# Patient Record
Sex: Female | Born: 1935 | Race: White | Hispanic: No | Marital: Married | Smoking: Never smoker
Health system: Southern US, Community
[De-identification: ages and names within clinical notes are randomized; demographics above are authoritative.]

## PROBLEM LIST (undated history)

## (undated) DIAGNOSIS — M8580 Other specified disorders of bone density and structure, unspecified site: Secondary | ICD-10-CM

## (undated) DIAGNOSIS — E782 Mixed hyperlipidemia: Secondary | ICD-10-CM

## (undated) DIAGNOSIS — K449 Diaphragmatic hernia without obstruction or gangrene: Secondary | ICD-10-CM

## (undated) DIAGNOSIS — K745 Biliary cirrhosis, unspecified: Secondary | ICD-10-CM

## (undated) DIAGNOSIS — R600 Localized edema: Secondary | ICD-10-CM

## (undated) DIAGNOSIS — E669 Obesity, unspecified: Secondary | ICD-10-CM

## (undated) DIAGNOSIS — I131 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, with stage 1 through stage 4 chronic kidney disease, or unspecified chronic kidney disease: Secondary | ICD-10-CM

## (undated) DIAGNOSIS — D649 Anemia, unspecified: Secondary | ICD-10-CM

## (undated) DIAGNOSIS — E7800 Pure hypercholesterolemia, unspecified: Secondary | ICD-10-CM

## (undated) DIAGNOSIS — K635 Polyp of colon: Secondary | ICD-10-CM

## (undated) DIAGNOSIS — D259 Leiomyoma of uterus, unspecified: Secondary | ICD-10-CM

## (undated) DIAGNOSIS — E119 Type 2 diabetes mellitus without complications: Secondary | ICD-10-CM

## (undated) DIAGNOSIS — M546 Pain in thoracic spine: Secondary | ICD-10-CM

## (undated) DIAGNOSIS — I10 Essential (primary) hypertension: Secondary | ICD-10-CM

## (undated) DIAGNOSIS — Z86010 Personal history of colonic polyps: Secondary | ICD-10-CM

## (undated) DIAGNOSIS — K5731 Diverticulosis of large intestine without perforation or abscess with bleeding: Secondary | ICD-10-CM

## (undated) DIAGNOSIS — D508 Other iron deficiency anemias: Secondary | ICD-10-CM

## (undated) DIAGNOSIS — I480 Paroxysmal atrial fibrillation: Secondary | ICD-10-CM

## (undated) DIAGNOSIS — K222 Esophageal obstruction: Secondary | ICD-10-CM

## (undated) DIAGNOSIS — N958 Other specified menopausal and perimenopausal disorders: Secondary | ICD-10-CM

## (undated) DIAGNOSIS — Z794 Long term (current) use of insulin: Secondary | ICD-10-CM

## (undated) DIAGNOSIS — N183 Chronic kidney disease, stage 3 unspecified: Secondary | ICD-10-CM

## (undated) DIAGNOSIS — K209 Esophagitis, unspecified: Secondary | ICD-10-CM

## (undated) DIAGNOSIS — K219 Gastro-esophageal reflux disease without esophagitis: Secondary | ICD-10-CM

## (undated) DIAGNOSIS — M1990 Unspecified osteoarthritis, unspecified site: Secondary | ICD-10-CM

## (undated) DIAGNOSIS — K8020 Calculus of gallbladder without cholecystitis without obstruction: Secondary | ICD-10-CM

## (undated) HISTORY — DX: Type 2 diabetes mellitus without complications: E11.9

## (undated) HISTORY — DX: Mixed hyperlipidemia: E78.2

## (undated) HISTORY — PX: BREAST CYST EXCISION: SHX579

## (undated) HISTORY — DX: Essential (primary) hypertension: I10

## (undated) HISTORY — DX: Diverticulosis of large intestine without perforation or abscess with bleeding: K57.31

## (undated) HISTORY — DX: Diaphragmatic hernia without obstruction or gangrene: K44.9

## (undated) HISTORY — DX: Anemia, unspecified: D64.9

## (undated) HISTORY — DX: Localized edema: R60.0

## (undated) HISTORY — PX: COLONOSCOPY: SHX174

## (undated) HISTORY — DX: Other iron deficiency anemias: D50.8

## (undated) HISTORY — DX: Obesity, unspecified: E66.9

## (undated) HISTORY — DX: Esophagitis, unspecified: K20.9

## (undated) HISTORY — DX: Leiomyoma of uterus, unspecified: D25.9

## (undated) HISTORY — DX: Pain in thoracic spine: M54.6

## (undated) HISTORY — DX: Polyp of colon: K63.5

## (undated) HISTORY — DX: Calculus of gallbladder without cholecystitis without obstruction: K80.20

## (undated) HISTORY — DX: Other specified disorders of bone density and structure, unspecified site: M85.80

## (undated) HISTORY — DX: Esophageal obstruction: K22.2

## (undated) HISTORY — DX: Pure hypercholesterolemia, unspecified: E78.00

## (undated) HISTORY — DX: Chronic kidney disease, stage 3 unspecified: N18.30

## (undated) HISTORY — DX: Biliary cirrhosis, unspecified: K74.5

## (undated) HISTORY — DX: Paroxysmal atrial fibrillation: I48.0

## (undated) HISTORY — DX: Gastro-esophageal reflux disease without esophagitis: K21.9

## (undated) HISTORY — DX: Other specified menopausal and perimenopausal disorders: N95.8

## (undated) HISTORY — DX: Long term (current) use of insulin: Z79.4

## (undated) HISTORY — PX: POLYPECTOMY: SHX149

## (undated) HISTORY — DX: 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, with stage 1 through stage 4 chronic kidney disease, or unspecified chronic kidney disease: I13.10

## (undated) HISTORY — DX: Personal history of colonic polyps: Z86.010

---

## 1970-05-12 HISTORY — PX: TUBAL LIGATION: SHX77

## 1978-05-12 HISTORY — PX: THYROIDECTOMY, PARTIAL: SHX18

## 1998-04-09 ENCOUNTER — Ambulatory Visit (HOSPITAL_COMMUNITY): Admission: RE | Admit: 1998-04-09 | Discharge: 1998-04-09 | Payer: Self-pay | Admitting: Infectious Diseases

## 1999-04-15 ENCOUNTER — Ambulatory Visit (HOSPITAL_COMMUNITY): Admission: RE | Admit: 1999-04-15 | Discharge: 1999-04-15 | Payer: Self-pay | Admitting: Internal Medicine

## 1999-04-15 ENCOUNTER — Encounter: Payer: Self-pay | Admitting: Internal Medicine

## 2000-04-21 ENCOUNTER — Encounter: Payer: Self-pay | Admitting: Internal Medicine

## 2000-04-21 ENCOUNTER — Ambulatory Visit (HOSPITAL_COMMUNITY): Admission: RE | Admit: 2000-04-21 | Discharge: 2000-04-21 | Payer: Self-pay | Admitting: Internal Medicine

## 2000-06-08 ENCOUNTER — Other Ambulatory Visit: Admission: RE | Admit: 2000-06-08 | Discharge: 2000-06-08 | Payer: Self-pay | Admitting: Gastroenterology

## 2000-06-08 ENCOUNTER — Encounter (INDEPENDENT_AMBULATORY_CARE_PROVIDER_SITE_OTHER): Payer: Self-pay | Admitting: Specialist

## 2000-06-08 DIAGNOSIS — K5731 Diverticulosis of large intestine without perforation or abscess with bleeding: Secondary | ICD-10-CM

## 2000-06-08 HISTORY — DX: Diverticulosis of large intestine without perforation or abscess with bleeding: K57.31

## 2000-07-07 ENCOUNTER — Encounter: Admission: RE | Admit: 2000-07-07 | Discharge: 2000-07-07 | Payer: Self-pay | Admitting: Internal Medicine

## 2000-07-07 ENCOUNTER — Encounter: Payer: Self-pay | Admitting: Internal Medicine

## 2001-05-10 ENCOUNTER — Encounter: Payer: Self-pay | Admitting: Family Medicine

## 2001-05-10 ENCOUNTER — Ambulatory Visit (HOSPITAL_COMMUNITY): Admission: RE | Admit: 2001-05-10 | Discharge: 2001-05-10 | Payer: Self-pay | Admitting: Family Medicine

## 2001-09-28 ENCOUNTER — Encounter: Admission: RE | Admit: 2001-09-28 | Discharge: 2001-09-28 | Payer: Self-pay | Admitting: Internal Medicine

## 2001-09-28 ENCOUNTER — Encounter: Payer: Self-pay | Admitting: Family Medicine

## 2002-05-25 ENCOUNTER — Encounter: Payer: Self-pay | Admitting: Family Medicine

## 2002-05-25 ENCOUNTER — Ambulatory Visit (HOSPITAL_COMMUNITY): Admission: RE | Admit: 2002-05-25 | Discharge: 2002-05-25 | Payer: Self-pay | Admitting: Family Medicine

## 2003-06-20 ENCOUNTER — Ambulatory Visit (HOSPITAL_COMMUNITY): Admission: RE | Admit: 2003-06-20 | Discharge: 2003-06-20 | Payer: Self-pay | Admitting: Obstetrics and Gynecology

## 2003-07-31 ENCOUNTER — Encounter: Payer: Self-pay | Admitting: Gastroenterology

## 2004-03-19 ENCOUNTER — Ambulatory Visit: Payer: Self-pay | Admitting: Family Medicine

## 2004-07-17 ENCOUNTER — Ambulatory Visit: Payer: Self-pay | Admitting: Gastroenterology

## 2004-07-30 ENCOUNTER — Ambulatory Visit: Payer: Self-pay | Admitting: Gastroenterology

## 2004-07-30 ENCOUNTER — Ambulatory Visit (HOSPITAL_COMMUNITY): Admission: RE | Admit: 2004-07-30 | Discharge: 2004-07-30 | Payer: Self-pay | Admitting: Family Medicine

## 2004-08-29 ENCOUNTER — Ambulatory Visit: Payer: Self-pay | Admitting: Gastroenterology

## 2004-10-31 ENCOUNTER — Ambulatory Visit: Payer: Self-pay | Admitting: Family Medicine

## 2005-01-06 ENCOUNTER — Ambulatory Visit: Payer: Self-pay | Admitting: Family Medicine

## 2005-01-29 ENCOUNTER — Ambulatory Visit: Payer: Self-pay | Admitting: Family Medicine

## 2005-02-05 ENCOUNTER — Ambulatory Visit: Payer: Self-pay | Admitting: Gastroenterology

## 2005-02-11 ENCOUNTER — Ambulatory Visit: Payer: Self-pay | Admitting: Gastroenterology

## 2005-03-19 ENCOUNTER — Ambulatory Visit: Payer: Self-pay | Admitting: Family Medicine

## 2005-07-07 ENCOUNTER — Ambulatory Visit: Payer: Self-pay | Admitting: Family Medicine

## 2005-07-29 ENCOUNTER — Ambulatory Visit: Payer: Self-pay | Admitting: Family Medicine

## 2005-08-21 ENCOUNTER — Ambulatory Visit (HOSPITAL_COMMUNITY): Admission: RE | Admit: 2005-08-21 | Discharge: 2005-08-21 | Payer: Self-pay | Admitting: Obstetrics and Gynecology

## 2005-12-31 ENCOUNTER — Ambulatory Visit: Payer: Self-pay | Admitting: Internal Medicine

## 2006-01-09 ENCOUNTER — Ambulatory Visit: Payer: Self-pay | Admitting: Internal Medicine

## 2006-01-14 ENCOUNTER — Ambulatory Visit: Payer: Self-pay | Admitting: *Deleted

## 2006-01-19 ENCOUNTER — Ambulatory Visit: Payer: Self-pay | Admitting: *Deleted

## 2006-01-26 ENCOUNTER — Ambulatory Visit: Payer: Self-pay | Admitting: Cardiology

## 2006-01-26 ENCOUNTER — Ambulatory Visit: Payer: Self-pay | Admitting: Gastroenterology

## 2006-02-03 ENCOUNTER — Ambulatory Visit: Payer: Self-pay | Admitting: Gastroenterology

## 2006-02-04 ENCOUNTER — Ambulatory Visit: Payer: Self-pay | Admitting: Cardiology

## 2006-02-12 ENCOUNTER — Ambulatory Visit: Payer: Self-pay | Admitting: Cardiology

## 2006-02-19 ENCOUNTER — Ambulatory Visit: Payer: Self-pay | Admitting: Cardiology

## 2006-03-05 ENCOUNTER — Ambulatory Visit: Payer: Self-pay | Admitting: Cardiology

## 2006-03-26 ENCOUNTER — Ambulatory Visit: Payer: Self-pay | Admitting: Cardiology

## 2006-04-24 ENCOUNTER — Ambulatory Visit: Payer: Self-pay | Admitting: Internal Medicine

## 2006-05-21 ENCOUNTER — Ambulatory Visit: Payer: Self-pay | Admitting: Cardiology

## 2006-06-18 ENCOUNTER — Ambulatory Visit: Payer: Self-pay | Admitting: Cardiology

## 2006-07-16 ENCOUNTER — Ambulatory Visit: Payer: Self-pay | Admitting: Cardiology

## 2006-08-13 ENCOUNTER — Ambulatory Visit: Payer: Self-pay | Admitting: Cardiology

## 2006-09-03 ENCOUNTER — Ambulatory Visit (HOSPITAL_COMMUNITY): Admission: RE | Admit: 2006-09-03 | Discharge: 2006-09-03 | Payer: Self-pay | Admitting: Family Medicine

## 2006-09-14 ENCOUNTER — Ambulatory Visit: Payer: Self-pay | Admitting: Cardiovascular Disease

## 2006-10-08 ENCOUNTER — Ambulatory Visit: Payer: Self-pay | Admitting: *Deleted

## 2006-11-04 ENCOUNTER — Ambulatory Visit: Payer: Self-pay | Admitting: Internal Medicine

## 2006-12-03 ENCOUNTER — Ambulatory Visit: Payer: Self-pay | Admitting: Cardiology

## 2006-12-31 ENCOUNTER — Ambulatory Visit: Payer: Self-pay | Admitting: Cardiology

## 2007-01-21 ENCOUNTER — Ambulatory Visit: Payer: Self-pay | Admitting: Cardiology

## 2007-02-18 ENCOUNTER — Ambulatory Visit: Payer: Self-pay | Admitting: Cardiology

## 2007-03-18 ENCOUNTER — Ambulatory Visit: Payer: Self-pay | Admitting: Cardiology

## 2007-03-23 ENCOUNTER — Ambulatory Visit: Payer: Self-pay | Admitting: Internal Medicine

## 2007-03-23 LAB — CONVERTED CEMR LAB
BUN: 14 mg/dL (ref 6–23)
CO2: 31 meq/L (ref 19–32)
Calcium: 9.2 mg/dL (ref 8.4–10.5)
Chloride: 101 meq/L (ref 96–112)
Creatinine, Ser: 0.7 mg/dL (ref 0.4–1.2)
GFR calc Af Amer: 106 mL/min
GFR calc non Af Amer: 88 mL/min
Glucose, Bld: 203 mg/dL — ABNORMAL HIGH (ref 70–99)
Potassium: 4.1 meq/L (ref 3.5–5.1)
Sodium: 140 meq/L (ref 135–145)

## 2007-04-15 ENCOUNTER — Ambulatory Visit: Payer: Self-pay | Admitting: Cardiovascular Disease

## 2007-05-04 ENCOUNTER — Ambulatory Visit: Payer: Self-pay | Admitting: Cardiology

## 2007-05-20 ENCOUNTER — Ambulatory Visit: Payer: Self-pay | Admitting: Internal Medicine

## 2007-06-17 ENCOUNTER — Ambulatory Visit: Payer: Self-pay | Admitting: Cardiology

## 2007-07-01 ENCOUNTER — Ambulatory Visit: Payer: Self-pay | Admitting: Cardiology

## 2007-07-29 ENCOUNTER — Ambulatory Visit: Payer: Self-pay | Admitting: Cardiology

## 2007-08-26 ENCOUNTER — Ambulatory Visit: Payer: Self-pay | Admitting: Cardiology

## 2007-09-23 ENCOUNTER — Ambulatory Visit (HOSPITAL_COMMUNITY): Admission: RE | Admit: 2007-09-23 | Discharge: 2007-09-23 | Payer: Self-pay | Admitting: Family Medicine

## 2007-09-23 ENCOUNTER — Ambulatory Visit: Payer: Self-pay | Admitting: Internal Medicine

## 2007-09-28 ENCOUNTER — Telehealth: Payer: Self-pay | Admitting: Gastroenterology

## 2007-10-14 ENCOUNTER — Ambulatory Visit: Payer: Self-pay | Admitting: Internal Medicine

## 2007-10-14 ENCOUNTER — Ambulatory Visit: Payer: Self-pay | Admitting: Gastroenterology

## 2007-10-14 LAB — CONVERTED CEMR LAB
ALT: 24 units/L (ref 0–35)
AST: 26 units/L (ref 0–37)
Albumin: 3.3 g/dL — ABNORMAL LOW (ref 3.5–5.2)
Alkaline Phosphatase: 141 units/L — ABNORMAL HIGH (ref 39–117)
Bilirubin, Direct: 0.1 mg/dL (ref 0.0–0.3)
Total Bilirubin: 0.7 mg/dL (ref 0.3–1.2)
Total Protein: 7.6 g/dL (ref 6.0–8.3)

## 2007-10-27 DIAGNOSIS — Z8719 Personal history of other diseases of the digestive system: Secondary | ICD-10-CM

## 2007-10-27 DIAGNOSIS — I10 Essential (primary) hypertension: Secondary | ICD-10-CM

## 2007-10-27 DIAGNOSIS — E119 Type 2 diabetes mellitus without complications: Secondary | ICD-10-CM | POA: Insufficient documentation

## 2007-10-27 DIAGNOSIS — Z86010 Personal history of colonic polyps: Secondary | ICD-10-CM

## 2007-10-27 DIAGNOSIS — D649 Anemia, unspecified: Secondary | ICD-10-CM

## 2007-10-27 DIAGNOSIS — K743 Primary biliary cirrhosis: Secondary | ICD-10-CM

## 2007-10-27 DIAGNOSIS — K745 Biliary cirrhosis, unspecified: Secondary | ICD-10-CM | POA: Insufficient documentation

## 2007-10-27 DIAGNOSIS — K5731 Diverticulosis of large intestine without perforation or abscess with bleeding: Secondary | ICD-10-CM | POA: Insufficient documentation

## 2007-10-27 DIAGNOSIS — E1159 Type 2 diabetes mellitus with other circulatory complications: Secondary | ICD-10-CM | POA: Insufficient documentation

## 2007-10-27 DIAGNOSIS — E7800 Pure hypercholesterolemia, unspecified: Secondary | ICD-10-CM | POA: Insufficient documentation

## 2007-10-27 HISTORY — DX: Anemia, unspecified: D64.9

## 2007-10-27 HISTORY — DX: Primary biliary cirrhosis: K74.3

## 2007-10-27 HISTORY — DX: Essential (primary) hypertension: I10

## 2007-10-27 HISTORY — DX: Personal history of other diseases of the digestive system: Z87.19

## 2007-10-28 ENCOUNTER — Ambulatory Visit: Payer: Self-pay | Admitting: Gastroenterology

## 2007-11-11 ENCOUNTER — Ambulatory Visit: Payer: Self-pay | Admitting: Cardiology

## 2007-11-17 ENCOUNTER — Ambulatory Visit: Payer: Self-pay | Admitting: Internal Medicine

## 2007-12-09 ENCOUNTER — Ambulatory Visit: Payer: Self-pay | Admitting: Cardiology

## 2007-12-23 ENCOUNTER — Ambulatory Visit: Payer: Self-pay | Admitting: Internal Medicine

## 2008-01-13 ENCOUNTER — Ambulatory Visit: Payer: Self-pay | Admitting: Internal Medicine

## 2008-01-27 ENCOUNTER — Ambulatory Visit: Payer: Self-pay | Admitting: Cardiology

## 2008-02-17 ENCOUNTER — Ambulatory Visit: Payer: Self-pay | Admitting: Cardiology

## 2008-03-16 ENCOUNTER — Ambulatory Visit: Payer: Self-pay | Admitting: Cardiology

## 2008-04-13 ENCOUNTER — Ambulatory Visit: Payer: Self-pay | Admitting: Cardiology

## 2008-04-27 ENCOUNTER — Ambulatory Visit: Payer: Self-pay | Admitting: Cardiology

## 2008-05-18 ENCOUNTER — Ambulatory Visit: Payer: Self-pay | Admitting: Cardiovascular Disease

## 2008-06-15 ENCOUNTER — Ambulatory Visit: Payer: Self-pay | Admitting: Internal Medicine

## 2008-07-13 ENCOUNTER — Ambulatory Visit: Payer: Self-pay | Admitting: Cardiovascular Disease

## 2008-08-10 ENCOUNTER — Ambulatory Visit: Payer: Self-pay | Admitting: Cardiovascular Disease

## 2008-08-31 ENCOUNTER — Encounter: Payer: Self-pay | Admitting: Gastroenterology

## 2008-08-31 ENCOUNTER — Telehealth: Payer: Self-pay | Admitting: Gastroenterology

## 2008-09-07 ENCOUNTER — Ambulatory Visit: Payer: Self-pay | Admitting: Internal Medicine

## 2008-10-05 ENCOUNTER — Ambulatory Visit: Payer: Self-pay | Admitting: Cardiovascular Disease

## 2008-10-05 LAB — CONVERTED CEMR LAB
POC INR: 3.3
Protime: 22

## 2008-10-11 ENCOUNTER — Encounter: Payer: Self-pay | Admitting: *Deleted

## 2008-10-12 ENCOUNTER — Ambulatory Visit: Payer: Self-pay | Admitting: Gastroenterology

## 2008-10-13 LAB — CONVERTED CEMR LAB
ALT: 25 units/L (ref 0–35)
AST: 29 units/L (ref 0–37)
Albumin: 3.3 g/dL — ABNORMAL LOW (ref 3.5–5.2)
Alkaline Phosphatase: 176 units/L — ABNORMAL HIGH (ref 39–117)
BUN: 17 mg/dL (ref 6–23)
Basophils Absolute: 0 10*3/uL (ref 0.0–0.1)
Basophils Relative: 0.4 % (ref 0.0–3.0)
Bilirubin, Direct: 0.1 mg/dL (ref 0.0–0.3)
CO2: 31 meq/L (ref 19–32)
Calcium: 8.8 mg/dL (ref 8.4–10.5)
Chloride: 104 meq/L (ref 96–112)
Creatinine, Ser: 0.7 mg/dL (ref 0.4–1.2)
Eosinophils Absolute: 0 10*3/uL (ref 0.0–0.7)
Eosinophils Relative: 1 % (ref 0.0–5.0)
GFR calc non Af Amer: 87.45 mL/min (ref >60)
Glucose, Bld: 81 mg/dL (ref 70–99)
HCT: 33.7 % — ABNORMAL LOW (ref 36.0–46.0)
Hemoglobin: 11.7 g/dL — ABNORMAL LOW (ref 12.0–15.0)
Lymphocytes Relative: 35.7 % (ref 12.0–46.0)
Lymphs Abs: 1.8 10*3/uL (ref 0.7–4.0)
MCHC: 34.9 g/dL (ref 30.0–36.0)
MCV: 90.3 fL (ref 78.0–100.0)
Monocytes Absolute: 0.6 10*3/uL (ref 0.1–1.0)
Monocytes Relative: 11.9 % (ref 3.0–12.0)
Neutro Abs: 2.6 10*3/uL (ref 1.4–7.7)
Neutrophils Relative %: 51 % (ref 43.0–77.0)
Platelets: 292 10*3/uL (ref 150.0–400.0)
Potassium: 4.1 meq/L (ref 3.5–5.1)
RBC: 3.73 M/uL — ABNORMAL LOW (ref 3.87–5.11)
RDW: 14.3 % (ref 11.5–14.6)
Sodium: 139 meq/L (ref 135–145)
TSH: 1.38 microintl units/mL (ref 0.35–5.50)
Total Bilirubin: 0.6 mg/dL (ref 0.3–1.2)
Total Protein: 7.4 g/dL (ref 6.0–8.3)
WBC: 5 10*3/uL (ref 4.5–10.5)

## 2008-10-26 ENCOUNTER — Ambulatory Visit: Payer: Self-pay | Admitting: Gastroenterology

## 2008-10-26 ENCOUNTER — Ambulatory Visit (HOSPITAL_COMMUNITY): Admission: RE | Admit: 2008-10-26 | Discharge: 2008-10-26 | Payer: Self-pay | Admitting: Family Medicine

## 2008-10-26 DIAGNOSIS — K8020 Calculus of gallbladder without cholecystitis without obstruction: Secondary | ICD-10-CM

## 2008-10-26 HISTORY — DX: Calculus of gallbladder without cholecystitis without obstruction: K80.20

## 2008-11-02 ENCOUNTER — Ambulatory Visit: Payer: Self-pay | Admitting: Internal Medicine

## 2008-11-02 ENCOUNTER — Ambulatory Visit: Payer: Self-pay | Admitting: Gastroenterology

## 2008-11-02 LAB — CONVERTED CEMR LAB
Fecal Occult Bld: NEGATIVE
POC INR: 2.8
Prothrombin Time: 20.2 s

## 2008-11-09 ENCOUNTER — Ambulatory Visit (HOSPITAL_COMMUNITY): Admission: RE | Admit: 2008-11-09 | Discharge: 2008-11-09 | Payer: Self-pay | Admitting: Gastroenterology

## 2008-11-15 ENCOUNTER — Encounter: Payer: Self-pay | Admitting: *Deleted

## 2008-11-15 DIAGNOSIS — I498 Other specified cardiac arrhythmias: Secondary | ICD-10-CM | POA: Insufficient documentation

## 2008-11-15 DIAGNOSIS — I440 Atrioventricular block, first degree: Secondary | ICD-10-CM | POA: Insufficient documentation

## 2008-11-15 DIAGNOSIS — I480 Paroxysmal atrial fibrillation: Secondary | ICD-10-CM | POA: Insufficient documentation

## 2008-11-15 DIAGNOSIS — I4891 Unspecified atrial fibrillation: Secondary | ICD-10-CM

## 2008-11-15 HISTORY — DX: Other specified cardiac arrhythmias: I49.8

## 2008-11-16 ENCOUNTER — Ambulatory Visit: Payer: Self-pay | Admitting: Internal Medicine

## 2008-11-30 ENCOUNTER — Ambulatory Visit: Payer: Self-pay | Admitting: Cardiovascular Disease

## 2008-11-30 LAB — CONVERTED CEMR LAB
POC INR: 2.5
Prothrombin Time: 19.3 s

## 2008-12-28 ENCOUNTER — Ambulatory Visit: Payer: Self-pay | Admitting: Cardiology

## 2008-12-28 LAB — CONVERTED CEMR LAB: POC INR: 2.8

## 2009-01-25 ENCOUNTER — Ambulatory Visit: Payer: Self-pay | Admitting: Cardiology

## 2009-01-25 LAB — CONVERTED CEMR LAB: POC INR: 3

## 2009-02-01 ENCOUNTER — Telehealth: Payer: Self-pay | Admitting: Internal Medicine

## 2009-03-01 ENCOUNTER — Ambulatory Visit: Payer: Self-pay | Admitting: Cardiovascular Disease

## 2009-03-01 LAB — CONVERTED CEMR LAB: POC INR: 2.4

## 2009-03-29 ENCOUNTER — Ambulatory Visit: Payer: Self-pay | Admitting: Cardiology

## 2009-03-29 LAB — CONVERTED CEMR LAB: POC INR: 2.7

## 2009-04-27 ENCOUNTER — Ambulatory Visit: Payer: Self-pay | Admitting: Cardiovascular Disease

## 2009-04-27 LAB — CONVERTED CEMR LAB: POC INR: 2.9

## 2009-05-14 ENCOUNTER — Ambulatory Visit: Payer: Self-pay | Admitting: Cardiology

## 2009-05-14 LAB — CONVERTED CEMR LAB: POC INR: 1.5

## 2009-05-31 ENCOUNTER — Ambulatory Visit: Payer: Self-pay | Admitting: Cardiology

## 2009-05-31 LAB — CONVERTED CEMR LAB: POC INR: 2

## 2009-06-21 ENCOUNTER — Ambulatory Visit: Payer: Self-pay | Admitting: Cardiology

## 2009-06-21 LAB — CONVERTED CEMR LAB: POC INR: 2.2

## 2009-07-19 ENCOUNTER — Ambulatory Visit: Payer: Self-pay | Admitting: Cardiology

## 2009-07-19 LAB — CONVERTED CEMR LAB: POC INR: 2.3

## 2009-08-20 ENCOUNTER — Ambulatory Visit: Payer: Self-pay | Admitting: Cardiovascular Disease

## 2009-08-20 LAB — CONVERTED CEMR LAB: POC INR: 2.6

## 2009-09-18 ENCOUNTER — Telehealth: Payer: Self-pay | Admitting: Gastroenterology

## 2009-09-20 ENCOUNTER — Ambulatory Visit: Payer: Self-pay | Admitting: Internal Medicine

## 2009-09-20 LAB — CONVERTED CEMR LAB: POC INR: 1.9

## 2009-10-18 ENCOUNTER — Ambulatory Visit: Payer: Self-pay | Admitting: Internal Medicine

## 2009-10-18 LAB — CONVERTED CEMR LAB: POC INR: 1.8

## 2009-10-19 ENCOUNTER — Ambulatory Visit: Payer: Self-pay | Admitting: Gastroenterology

## 2009-10-19 LAB — CONVERTED CEMR LAB
ALT: 24 units/L (ref 0–35)
AST: 24 units/L (ref 0–37)
Albumin: 3.3 g/dL — ABNORMAL LOW (ref 3.5–5.2)
Alkaline Phosphatase: 137 units/L — ABNORMAL HIGH (ref 39–117)
BUN: 25 mg/dL — ABNORMAL HIGH (ref 6–23)
Basophils Absolute: 0 10*3/uL (ref 0.0–0.1)
Basophils Relative: 0.2 % (ref 0.0–3.0)
Bilirubin, Direct: 0 mg/dL (ref 0.0–0.3)
CO2: 28 meq/L (ref 19–32)
Calcium: 9 mg/dL (ref 8.4–10.5)
Chloride: 105 meq/L (ref 96–112)
Creatinine, Ser: 0.8 mg/dL (ref 0.4–1.2)
Eosinophils Absolute: 0 10*3/uL (ref 0.0–0.7)
Eosinophils Relative: 0.5 % (ref 0.0–5.0)
Ferritin: 12.8 ng/mL (ref 10.0–291.0)
Folate: >20 ng/mL
GFR calc non Af Amer: 78.12 mL/min (ref >60)
Glucose, Bld: 220 mg/dL — ABNORMAL HIGH (ref 70–99)
HCT: 32.4 % — ABNORMAL LOW (ref 36.0–46.0)
Hemoglobin: 10.8 g/dL — ABNORMAL LOW (ref 12.0–15.0)
Iron: 40 ug/dL — ABNORMAL LOW (ref 42–145)
Lymphocytes Relative: 33 % (ref 12.0–46.0)
Lymphs Abs: 1.7 10*3/uL (ref 0.7–4.0)
MCHC: 33.4 g/dL (ref 30.0–36.0)
MCV: 90.7 fL (ref 78.0–100.0)
Monocytes Absolute: 0.5 10*3/uL (ref 0.1–1.0)
Monocytes Relative: 9.9 % (ref 3.0–12.0)
Neutro Abs: 2.8 10*3/uL (ref 1.4–7.7)
Neutrophils Relative %: 56.4 % (ref 43.0–77.0)
Platelets: 287 10*3/uL (ref 150.0–400.0)
Potassium: 4.7 meq/L (ref 3.5–5.1)
RBC: 3.57 M/uL — ABNORMAL LOW (ref 3.87–5.11)
RDW: 15.6 % — ABNORMAL HIGH (ref 11.5–14.6)
Saturation Ratios: 9.4 % — ABNORMAL LOW (ref 20.0–50.0)
Sodium: 140 meq/L (ref 135–145)
Total Bilirubin: 0.5 mg/dL (ref 0.3–1.2)
Total Protein: 6.7 g/dL (ref 6.0–8.3)
Transferrin: 302.4 mg/dL (ref 212.0–360.0)
Vitamin B-12: 340 pg/mL (ref 211–911)
WBC: 5 10*3/uL (ref 4.5–10.5)

## 2009-11-01 ENCOUNTER — Ambulatory Visit (HOSPITAL_COMMUNITY): Admission: RE | Admit: 2009-11-01 | Discharge: 2009-11-01 | Payer: Self-pay | Admitting: Obstetrics and Gynecology

## 2009-11-01 ENCOUNTER — Ambulatory Visit: Payer: Self-pay | Admitting: Gastroenterology

## 2009-11-15 ENCOUNTER — Ambulatory Visit: Payer: Self-pay | Admitting: Internal Medicine

## 2009-11-15 LAB — CONVERTED CEMR LAB: POC INR: 1.6

## 2009-11-16 ENCOUNTER — Telehealth: Payer: Self-pay | Admitting: Gastroenterology

## 2009-11-22 ENCOUNTER — Ambulatory Visit: Payer: Self-pay | Admitting: Internal Medicine

## 2009-11-29 ENCOUNTER — Ambulatory Visit: Payer: Self-pay | Admitting: Cardiology

## 2009-11-29 LAB — CONVERTED CEMR LAB: POC INR: 2.3

## 2009-12-03 ENCOUNTER — Ambulatory Visit: Payer: Self-pay | Admitting: Cardiology

## 2009-12-03 LAB — CONVERTED CEMR LAB: POC INR: 3.4

## 2009-12-07 ENCOUNTER — Telehealth: Payer: Self-pay | Admitting: Internal Medicine

## 2009-12-19 ENCOUNTER — Ambulatory Visit: Payer: Self-pay | Admitting: Cardiology

## 2009-12-19 LAB — CONVERTED CEMR LAB: POC INR: 2.1

## 2010-01-04 ENCOUNTER — Telehealth: Payer: Self-pay | Admitting: Internal Medicine

## 2010-01-17 ENCOUNTER — Ambulatory Visit: Payer: Self-pay | Admitting: Internal Medicine

## 2010-01-17 LAB — CONVERTED CEMR LAB: POC INR: 2.1

## 2010-02-12 ENCOUNTER — Ambulatory Visit: Payer: Self-pay | Admitting: Cardiology

## 2010-02-12 LAB — CONVERTED CEMR LAB: POC INR: 2.3

## 2010-02-13 ENCOUNTER — Ambulatory Visit: Payer: Self-pay | Admitting: Gastroenterology

## 2010-02-21 LAB — CONVERTED CEMR LAB
Basophils Absolute: 0 10*3/uL (ref 0.0–0.1)
Basophils Relative: 0.4 % (ref 0.0–3.0)
Eosinophils Absolute: 0 10*3/uL (ref 0.0–0.7)
Eosinophils Relative: 0.6 % (ref 0.0–5.0)
HCT: 34 % — ABNORMAL LOW (ref 36.0–46.0)
Hemoglobin: 11.6 g/dL — ABNORMAL LOW (ref 12.0–15.0)
Iron: 46 ug/dL (ref 42–145)
Lymphocytes Relative: 27 % (ref 12.0–46.0)
Lymphs Abs: 1.8 10*3/uL (ref 0.7–4.0)
MCHC: 34.2 g/dL (ref 30.0–36.0)
MCV: 92.2 fL (ref 78.0–100.0)
Monocytes Absolute: 0.7 10*3/uL (ref 0.1–1.0)
Monocytes Relative: 10.3 % (ref 3.0–12.0)
Neutro Abs: 4.1 10*3/uL (ref 1.4–7.7)
Neutrophils Relative %: 61.7 % (ref 43.0–77.0)
Platelets: 291 10*3/uL (ref 150.0–400.0)
RBC: 3.68 M/uL — ABNORMAL LOW (ref 3.87–5.11)
RDW: 16.1 % — ABNORMAL HIGH (ref 11.5–14.6)
Saturation Ratios: 10.4 % — ABNORMAL LOW (ref 20.0–50.0)
Transferrin: 316 mg/dL (ref 212.0–360.0)
WBC: 6.7 10*3/uL (ref 4.5–10.5)

## 2010-03-14 ENCOUNTER — Ambulatory Visit: Payer: Self-pay | Admitting: Cardiology

## 2010-03-14 LAB — CONVERTED CEMR LAB: POC INR: 1.8

## 2010-04-05 ENCOUNTER — Ambulatory Visit: Payer: Self-pay | Admitting: Cardiology

## 2010-04-05 LAB — CONVERTED CEMR LAB: POC INR: 2.1

## 2010-04-11 ENCOUNTER — Encounter: Payer: Self-pay | Admitting: Gastroenterology

## 2010-05-02 ENCOUNTER — Ambulatory Visit: Payer: Self-pay | Admitting: Cardiology

## 2010-05-02 LAB — CONVERTED CEMR LAB: POC INR: 2.1

## 2010-05-30 ENCOUNTER — Ambulatory Visit: Admission: RE | Admit: 2010-05-30 | Discharge: 2010-05-30 | Payer: Self-pay | Source: Home / Self Care

## 2010-05-30 LAB — CONVERTED CEMR LAB: POC INR: 2

## 2010-06-06 ENCOUNTER — Ambulatory Visit: Admit: 2010-06-06 | Payer: Self-pay | Admitting: Gastroenterology

## 2010-06-13 NOTE — Medication Information (Signed)
Summary: ROV COUMADIN - [facility]   Anticoagulant Therapy  Managed by: Ronnell Freshwater rn PCP: Gerrit Heck, MD Supervising MD: Dorris Carnes MD  PT 22.0  Dietary changes: no    Health status changes: no    Bleeding/hemorrhagic complications: no    Recent/future hospitalizations: no    Any changes in medication regimen? yes       Details: Glimiperide inceased from 2 mg to 4 mg. Micardis changed to a cheaper BP med  Recent/future dental: no  Any missed doses?: no       Is patient compliant with meds? yes       Allergies: 1)  ! Ace Inhibitors  Anticoagulation Management History:      The patient is on coumadin and comes in today for a routine follow up visit.  Positive risk factors for bleeding include an age of 42 years or older and presence of serious comorbidities.  The bleeding index is 'intermediate risk'.  Positive CHADS2 values include History of HTN and History of Diabetes.  Negative CHADS2 values include Age > 57 years old.    Anticoagulation Management Assessment/Plan:      The patient's current anticoagulation dose is Coumadin 2.5 mg  tabs: as directed.  Anticoagulation instructions were given to patient.  Results were reviewed/authorized by jen shumaker rn.  She was notified by Ronnell Freshwater rn.         Current Anticoagulation Instructions: 2.5 mg today (5/29) then resume 5 mg TTSa and 2.5 mg other days.  increase greens

## 2010-06-13 NOTE — Assessment & Plan Note (Signed)
Summary: annual...as.    History of Present Illness Visit Type: Follow-up Visit Primary GI MD: Verl Blalock MD FACP [name] Primary Provider: Gerrit Heck, MD Requesting Provider: N/A Chief Complaint: Yearly Follow up, Needs refill Ursodiol History of Present Illness:   No GI or hepatobiliary complaints. 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 Repeat upper abdominal ultrasound showed adenomyosis of the gallbladder with a possible stone which is asymptomatic. There is no evidence of ascites or hepatic masses. She denies symptoms of congestive heart failure, abdominal swelling, mental status changes, nausea vomiting, or any upper GI problems. She is up-to-date on her colonoscopy exam. Recent labs showed some mild iron deficiency hemoglobin of 10. She is followed by cardiology because of hypertensive cardiovascular disease.She is on chronic Coumadin therapy. Recent Hemoccult cards were guaiac negative.   GI Review of Systems      Denies abdominal pain, acid reflux, belching, bloating, chest pain, dysphagia with liquids, dysphagia with solids, heartburn, loss of appetite, nausea, vomiting, vomiting blood, weight loss, and  weight gain.        Denies anal fissure, black tarry stools, change in bowel habit, constipation, diarrhea, diverticulosis, fecal incontinence, heme positive stool, hemorrhoids, irritable bowel syndrome, jaundice, light color stool, liver problems, rectal bleeding, and  rectal pain.    Current Medications (verified): 1)  Rythmol Sr 325 Mg Cp12 (Propafenone Hcl) .... Take 1 Capsule By Mouth Every Twelve Hours 2)  Coumadin 5 Mg Tabs (Warfarin Sodium) .... Take As Directed By Coumadin Clinic. 3)  Lopressor 50 Mg  Tabs (Metoprolol Tartrate) .... 1/2 At Am and 1  At Pm 4)  Furosemide 40 Mg  Tabs (Furosemide) .... Once Daily 5)  Simvastatin 40 Mg  Tabs (Simvastatin) .... Once At Night 6)   Fexofenadine Hcl 180 Mg  Tabs (Fexofenadine Hcl) .... Once Daily 7)  Clidinium-Chlordiazepoxide 2.5-5 Mg  Caps (Clidinium-Chlordiazepoxide) .... As Needed 8)  Aspirin 81 Mg  Tbec (Aspirin) .... Once Daily 9)  Ursodiol 300 Mg  Caps (Ursodiol) .... One By Mouth Two Times A Day 10)  Metformin Hcl 1000 Mg Tabs (Metformin Hcl) .Marland Kitchen.. 1 By Mouth Two Times A Day 11)  Glimepiride 4 Mg Tabs (Glimepiride) .... Take 1 Tablet By Mouth Two Times A Day 12)  Cozaar 100 Mg Tabs (Losartan Potassium) .... One Tablet Two Times A Day  Allergies (verified): 1)  ! Ace Inhibitors  Past History:  Family History: Last updated: 10/28/2007 No FH of Colon Cancer: Family History of Diabetes:  self sisters brother aunt  Social History: Last updated: 10/28/2007 Patient has never smoked.  Alcohol Use - no Illicit Drug Use - no Patient gets regular exercise.  Past medical, surgical, family and social histories (including risk factors) reviewed for relevance to current acute and chronic problems.  Past Medical History: Reviewed history from 11/15/2008 and no changes required. 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 Thyroid Surgery partical in the 80s Tubal Ligation 1972  Family History: Reviewed history from 10/28/2007 and no changes required. No FH of Colon Cancer: Family History of Diabetes:  self sisters brother aunt  Social History: Reviewed history from 10/28/2007 and no changes required. Patient has never smoked.  Alcohol Use - no Illicit Drug Use - no Patient gets regular exercise.  Review of Systems  The patient denies allergy/sinus, anemia, anxiety-new, arthritis/joint pain, back pain,  blood in urine, breast changes/lumps, change in vision, confusion, cough, coughing up blood, depression-new, fainting, fatigue, fever, headaches-new, hearing problems, heart murmur, heart rhythm changes, itching, menstrual pain, muscle pains/cramps, night sweats, nosebleeds, pregnancy symptoms, shortness of breath, skin rash, sleeping problems, sore throat, swelling of feet/legs, swollen lymph glands, thirst - excessive , urination - excessive , urination changes/pain, urine leakage, vision changes, and voice change.    Vital Signs:  Patient profile:   76 year old female Height:      64 inches Weight:      190 pounds BMI:     32.73 BSA:     1.92 Pulse rate:   64 / minute Pulse rhythm:   regular BP sitting:   112 / 74  (left arm)  Vitals Entered By: Abernathy Deborra Medina) (November 01, 2009 11:03 AM)  Physical Exam  General:  Well developed, well nourished, no acute distress.healthy appearing.  Mental normal and no asterixis. I cannot appreciate stigmata of chronic liver disease. Head:  Normocephalic and atraumatic. Eyes:  PERRLA, no icterus.exam deferred to patient's ophthalmologist.   Lungs:  Clear throughout to auscultation. Heart:  Regular rate and rhythm; no murmurs, rubs,  or bruits. Abdomen:  Soft, nontender and nondistended. No masses, hepatosplenomegaly or hernias noted. Normal bowel sounds.umbilical hernia noted Rectal:  deferred Msk:  Symmetrical with no gross deformities. Normal posture. Extremities:  No clubbing, cyanosis, edema or deformities noted. Neurologic:  Alert and  oriented x4;  grossly normal neurologically. Psych:  Alert and cooperative. Normal mood and affect.   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 We will continue Urso 300 mg a day with periodic GI followup every 6-12 months. Review of her previous colonoscopy showed previous hyperplastic  polyps. I have placed her on tandem daily for 3 months and will repeat her CBC and iron studies at that time. Noted that she is on Coumadin and aspirin, and she may have occult GI blood loss related to these meds. However, recent Hemoccult cards were guaiac negative. We may need to consider endoscopic exam.  Problem # 2:  ATRIAL FIBRILLATION, PAROXYSMAL (ICD-427.31) Assessment: Unchanged continued multiple cardiac medications per primary care  Problem # 3:  GALLSTONES (ICD-574.20) Assessment: Unchanged Asymptomatic cholelithiasis on Urso 300 mg a day chronically.  Problem # 4:  DIVERTICULOSIS, COLON, WITH HEMORRHAGE (ICD-562.12) Assessment: Unchanged continue high-fiber diet as tolerated.  Patient Instructions: 1)  Begin tandem daily.  A prescription will be sent in for this medication. 2)  Return in three months for lab work. The week of  September 26 will be three months.  3)  The medication list was reviewed and reconciled.  All changed / newly prescribed medications were explained.  A complete medication list was provided to the patient / caregiver. 4)  Copy sent to : Dr. Gerrit Heck 5)  Please continue current medications.  Prescriptions: TANDEM 162-115.2 MG CAPS (FERROUS FUM-IRON POLYSACCH) 1 by mouth qd  #90 x 3   Entered by:   Alberteen Spindle RN   Authorized by:   [name] MD [facility]   Signed by:   Alberteen Spindle RN on 11/01/2009   Method used:   Print then Give to Patient   RxID:   [id number] URSODIOL 300 MG  CAPS (URSODIOL) one by mouth two times a day  #180 x 3  Entered by:   Alberteen Spindle RN   Authorized by:   [name] MD [facility]   Signed by:   Alberteen Spindle RN on 11/01/2009   Method used:   Print then Give to Patient   RxID:   [id number]

## 2010-06-13 NOTE — Medication Information (Signed)
Summary: rov/ln  Anticoagulant Therapy  Managed by: Porfirio Oar, PharmD Referring MD: Virl Axe MD PCP: Gerrit Heck, MD Supervising MD: Stanford Breed MD, Aaron Edelman Indication 1: Atrial Fibrillation (ICD-427.31) Lab Used: [facility] Site: [facility] INR POC 2.1 INR RANGE 2 - 3  Dietary changes: no    Health status changes: no    Bleeding/hemorrhagic complications: no    Recent/future hospitalizations: no    Any changes in medication regimen? no    Recent/future dental: no  Any missed doses?: no       Is patient compliant with meds? yes       Allergies: 1)  ! Ace Inhibitors 2)  ! Neosporin  Anticoagulation Management History:      The patient is taking warfarin and comes in today for a routine follow up visit.  Positive risk factors for bleeding include an age of 84 years or older and presence of serious comorbidities.  The bleeding index is 'intermediate risk'.  Positive CHADS2 values include History of HTN and History of Diabetes.  Negative CHADS2 values include Age > 38 years old.  The start date was 01/13/2006.  Anticoagulation responsible provider: Stanford Breed MD, Aaron Edelman.  INR POC: 2.1.  Cuvette Lot#: [id number].  Exp: 02/2011.    Anticoagulation Management Assessment/Plan:      The patient's current anticoagulation dose is Coumadin 5 mg tabs: Take as directed by coumadin clinic..  The target INR is 2.0-3.0.  The next INR is due 01/17/2010.  Anticoagulation instructions were given to patient.  Results were reviewed/authorized by Porfirio Oar, PharmD.  She was notified by Aubery Lapping, PharmD Candidate.         Prior Anticoagulation Instructions: INR 3.4  Hold coumadin today and then take 1/2 tab tomorrow.  Then resume normal regimen of 1/2 tab on Monday, Wednesday, and Friday and 1 tab Sunday, Tuesday, Thursday, and Saturday.  Re-check in 3 weeks.  Current Anticoagulation Instructions: INR 2.1  Continue with 1 tablet daily except 1/2 tablet Mon, Wed, and Fri.  Return to  clinic in 4 weeks.

## 2010-06-13 NOTE — Medication Information (Signed)
Summary: rov/sp   Anticoagulant Therapy  Managed by: Porfirio Oar, PharmD Referring MD: Virl Axe MD PCP: Gerrit Heck, MD Supervising MD: Lovena Le MD,Gregg Indication 1: Atrial Fibrillation (ICD-427.31) Lab Used: [place] Site: [facility] INR POC 2.1 INR RANGE 2 - 3  Dietary changes: no    Health status changes: no    Bleeding/hemorrhagic complications: no    Recent/future hospitalizations: no    Any changes in medication regimen? yes       Details: changes actos to [place]  Recent/future dental: no  Any missed doses?: no       Is patient compliant with meds? yes       Allergies: 1)  ! Ace Inhibitors 2)  ! Neosporin  Anticoagulation Management History:      The patient is taking warfarin and comes in today for a routine follow up visit.  Positive risk factors for bleeding include an age of 74 years or older and presence of serious comorbidities.  The bleeding index is 'intermediate risk'.  Positive CHADS2 values include History of HTN and History of Diabetes.  Negative CHADS2 values include Age > 93 years old.  The start date was 01/13/2006.  Anticoagulation responsible provider: Lovena Le MD,Gregg.  INR POC: 2.1.  Cuvette Lot#: [id number].  Exp: 02/2011.    Anticoagulation Management Assessment/Plan:      The patient's current anticoagulation dose is Coumadin 5 mg tabs: Take as directed by coumadin clinic..  The target INR is 2.0-3.0.  The next INR is due 02/14/2010.  Anticoagulation instructions were given to patient.  Results were reviewed/authorized by Porfirio Oar, PharmD.  She was notified by Mammie Lorenzo PharmD.         Prior Anticoagulation Instructions: INR 2.1  Continue with 1 tablet daily except 1/2 tablet Mon, Wed, and Fri.  Return to clinic in 4 weeks.  Current Anticoagulation Instructions: INR 2.1 Continue taking current doses. No changes today. Continue 1 tablet on sunday, tuesday, thursday, and saturday and 1/2 tablet on monday, wednesday, and  friday.

## 2010-06-13 NOTE — Progress Notes (Signed)
Summary: labwork   Phone Note Call from Patient Call back at [facility] Phone [phone]   Caller: Patient Call For: Dr. Sharlett Iles Reason for Call: Talk to Nurse Summary of Call: pt would like to sch labwork before June appt Initial call taken by: Lucien Mons,  Sep 18, 2009 10:30 AM  Follow-up for Phone Call        What lab work is needed? Follow-up by: Alberteen Spindle RN,  Sep 18, 2009 10:32 AM  Additional Follow-up for Phone Call Additional follow up Details #1::        Anthoston panel and anemia profile Additional Follow-up by: [name] MD Marval Regal,  Sep 18, 2009 12:33 PM    Additional Follow-up for Phone Call Additional follow up Details #2::    Lab order placed in [place],  Pt notified.  will come by a few days before OV on June 23.  Follow-up by: Alberteen Spindle RN,  Sep 18, 2009 1:39 PM

## 2010-06-13 NOTE — Medication Information (Signed)
Summary: rov/mw  Anticoagulant Therapy  Managed by: Freddrick March, RN, BSN Referring MD: Virl Axe MD PCP: Gerrit Heck, MD Supervising MD: Stanford Breed MD, Aaron Edelman Indication 1: Atrial Fibrillation (ICD-427.31) Lab Used: [facility] Site: [facility] INR POC 2.3 INR RANGE 2 - 3  Dietary changes: no    Health status changes: no    Bleeding/hemorrhagic complications: no    Recent/future hospitalizations: no    Any changes in medication regimen? no    Recent/future dental: no  Any missed doses?: no       Is patient compliant with meds? yes       Allergies: 1)  ! Ace Inhibitors 2)  ! Neosporin  Anticoagulation Management History:      The patient is taking warfarin and comes in today for a routine follow up visit.  Positive risk factors for bleeding include an age of 74 years or older and presence of serious comorbidities.  The bleeding index is 'intermediate risk'.  Positive CHADS2 values include History of HTN and History of Diabetes.  Negative CHADS2 values include Age > 5 years old.  The start date was 01/13/2006.  Anticoagulation responsible provider: Stanford Breed MD, Aaron Edelman.  INR POC: 2.3.  Cuvette Lot#: [id number].  Exp: 03/2011.    Anticoagulation Management Assessment/Plan:      The patient's current anticoagulation dose is Coumadin 5 mg tabs: Take as directed by coumadin clinic..  The target INR is 2.0-3.0.  The next INR is due 03/14/2010.  Anticoagulation instructions were given to patient.  Results were reviewed/authorized by Freddrick March, RN, BSN.  She was notified by Freddrick March RN.         Prior Anticoagulation Instructions: INR 2.1 Continue taking current doses. No changes today. Continue 1 tablet on sunday, tuesday, thursday, and saturday and 1/2 tablet on monday, wednesday, and friday.  Current Anticoagulation Instructions: INR 2.3  Continue on same dosage 1 tablet daily except 1/2 tablet on Mondays, Wednesdays, and Fridays.  Recheck in 4 weeks.

## 2010-06-13 NOTE — Medication Information (Signed)
Summary: rov/ewj  Anticoagulant Therapy  Managed by: Freddrick March, RN, BSN Referring MD: Virl Axe MD PCP: Gerrit Heck, MD Supervising MD: Percival Spanish MD, Jeneen Rinks Indication 1: Atrial Fibrillation (ICD-427.31) Lab Used: [facility] Site: [facility] INR POC 2.8 INR RANGE 2 - 3  Dietary changes: no    Health status changes: no    Bleeding/hemorrhagic complications: no    Recent/future hospitalizations: no    Any changes in medication regimen? yes       Details: Incr Glimeperide.  Recent/future dental: no  Any missed doses?: no       Is patient compliant with meds? yes       Current Medications (verified): 1)  Rythmol Sr 325 Mg Cp12 (Propafenone Hcl) .... Take 1 Capsule By Mouth Every Twelve Hours 2)  Coumadin 5 Mg Tabs (Warfarin Sodium) .... Take As Directed By Coumadin Clinic. 3)  Lopressor 50 Mg  Tabs (Metoprolol Tartrate) .... 1/2 At Am and 1/2  At Pm 4)  Furosemide 40 Mg  Tabs (Furosemide) .... Once Daily 5)  Simvastatin 40 Mg  Tabs (Simvastatin) .... Once At Night 6)  Fexofenadine Hcl 180 Mg  Tabs (Fexofenadine Hcl) .... Once Daily 7)  Clidinium-Chlordiazepoxide 2.5-5 Mg  Caps (Clidinium-Chlordiazepoxide) .... As Needed 8)  Aspirin 81 Mg  Tbec (Aspirin) .... Once Daily 9)  Ursodiol 300 Mg  Caps (Ursodiol) .... One By Mouth Two Times A Day 10)  Metformin Hcl 500 Mg Tabs (Metformin Hcl) .... Take 1 Tablet in The Am and 2 Tablets in The Pm. 11)  Glimepiride 4 Mg Tabs (Glimepiride) .... Take 1 Tablet By Mouth Once A Day 12)  Cozaar 100 Mg Tabs (Losartan Potassium) .... One Tablet Two Times A Day  Allergies (verified): 1)  ! Ace Inhibitors  Anticoagulation Management History:      The patient is taking warfarin and comes in today for a routine follow up visit.  Positive risk factors for bleeding include an age of 74 years or older and presence of serious comorbidities.  The bleeding index is 'intermediate risk'.  Positive CHADS2 values include History of HTN and  History of Diabetes.  Negative CHADS2 values include Age > 74 years old.  The start date was 01/13/2006.  Anticoagulation responsible provider: Percival Spanish MD, Jeneen Rinks.  INR POC: 2.8.  Cuvette Lot#: [id number].  Exp: 02/09/2010.    Anticoagulation Management Assessment/Plan:      The patient's current anticoagulation dose is Coumadin 5 mg tabs: Take as directed by coumadin clinic..  The target INR is 2.0-3.0.  The next INR is due 01/25/2009.  Anticoagulation instructions were given to patient.  Results were reviewed/authorized by Freddrick March, RN, BSN.  She was notified by Freddrick March RN.         Prior Anticoagulation Instructions: INR 2.5  Continue same dosage 1/2 tablet daily except 1 tablet on Tuesdays, Thursdays and Saturdays.  Recheck in 4 weeks.    Current Anticoagulation Instructions: INR 2.8  Continue on same dosage 1 tablet on Tuesdays, Thursdays, and Saturdays and 1/2 tablet all other days.  Recheck in 4 weeks.

## 2010-06-13 NOTE — Progress Notes (Signed)
Summary: Tandem not available  Medications Added TANDEM PLUS 162-115.2-1 MG CAPS (FEFUM-FEPO-FA-B CMP-C-ZN-MN-CU) 1 once daily X 3 mo.       Phone Note From Pharmacy   Caller: [facility]. [phone]* Summary of Call: Pharmacist calling.  Tandem has been discontinued by manufacture.  Need another med. Initial call taken by: Alberteen Spindle RN,  November 16, 2009 8:29 AM  Follow-up for Phone Call        Change to Tandem Plus.   Pharmacy advised. Follow-up by: Alberteen Spindle RN,  November 16, 2009 8:30 AM    New/Updated Medications: TANDEM PLUS 162-115.2-1 MG CAPS (FEFUM-FEPO-FA-B CMP-C-ZN-MN-CU) 1 once daily X 3 mo.

## 2010-06-13 NOTE — Progress Notes (Signed)
Summary: lab orders   Phone Note Call from Patient Call back at Home Phone [phone]   Caller: Patient Call For: Teagen Mcleary Reason for Call: Talk to Nurse Summary of Call: Patient needs lab orders put in the system a week before her appt 10-26-08 Initial call taken by: Ronalee Red,  August 31, 2008 10:30 AM  Follow-up for Phone Call        Dr. Sharlett Iles would you like labs before her visit in june? Follow-up by: Bernita Buffy CMA,  August 31, 2008 10:47 AM  Additional Follow-up for Phone Call Additional follow up Details #1::        liver profile Additional Follow-up by: [name] MD [facility],  August 31, 2008 11:52 AM    Additional Follow-up for Phone Call Additional follow up Details #2::    order in [facility] and Left message on patients machine to call back if questions.  Follow-up by: Bernita Buffy CMA,  August 31, 2008 2:02 PM

## 2010-06-13 NOTE — Medication Information (Signed)
Summary: [facility]  Anticoagulant Therapy  Managed by: Gwynneth Albright, PharmD Referring MD: Virl Axe MD PCP: Gerrit Heck, MD Supervising MD: Angelena Form MD, Harrell Gave Indication 1: Atrial Fibrillation (ICD-427.31) Lab Used: [facility] Site: [facility] INR POC 2.6 INR RANGE 2 - 3  Dietary changes: no    Health status changes: no    Bleeding/hemorrhagic complications: no    Recent/future hospitalizations: no    Any changes in medication regimen? no    Recent/future dental: no  Any missed doses?: no       Is patient compliant with meds? yes       Allergies: 1)  ! Ace Inhibitors  Anticoagulation Management History:      The patient is taking warfarin and comes in today for a routine follow up visit.  Positive risk factors for bleeding include an age of 74 years or older and presence of serious comorbidities.  The bleeding index is 'intermediate risk'.  Positive CHADS2 values include History of HTN and History of Diabetes.  Negative CHADS2 values include Age > 74 years old.  The start date was 01/13/2006.  Anticoagulation responsible provider: Angelena Form MD, Harrell Gave.  INR POC: 2.6.  Cuvette Lot#: [id number].  Exp: 09/2010.    Anticoagulation Management Assessment/Plan:      The patient's current anticoagulation dose is Coumadin 5 mg tabs: Take as directed by coumadin clinic..  The target INR is 2.0-3.0.  The next INR is due 09/20/2009.  Anticoagulation instructions were given to patient.  Results were reviewed/authorized by Gwynneth Albright, PharmD.  She was notified by Gwynneth Albright, PharmD.         Prior Anticoagulation Instructions: INR 2.3   Continue current dosing schedule.  Take 1 tablet on Teusday, Thursday, and Saturday, and take 1/2 tablet all other days.  Return to clinic in 4 weeks.  Current Anticoagulation Instructions: INR 2.6. Take 0.5 tablet daily except 1 tablet on Tues, Thurs, Sat.

## 2010-06-13 NOTE — Assessment & Plan Note (Signed)
Summary: yearly/sl  Medications Added ACTOS 45 MG TABS (PIOGLITAZONE HCL) once daily      Allergies Added: ! NEOSPORIN  Referring Provider:  n/a Primary Provider:  Gerrit Heck, MD  CC:  yearly. Pt reports doing well.Marland Kitchen  History of Present Illness: Sara Hancock is seen in followup for atrial fibrillation that is paroxysmal. It occurs in the setting of background bradycardia and mild first-degree AV block. She is doing quite well without recurrent tachypalpitations.She is tolerating [name] without complaint  She denies exercise intolerance chest pain or shortness of breath there. There is no dizziness  She recently has started on Actos and there has been a little bit of fluid accumulation    Current Medications (verified): 1)  Rythmol Sr 325 Mg Cp12 (Propafenone Hcl) .... Take 1 Capsule By Mouth Every Twelve Hours 2)  Coumadin 5 Mg Tabs (Warfarin Sodium) .... Take As Directed By Coumadin Clinic. 3)  Lopressor 50 Mg  Tabs (Metoprolol Tartrate) .... 1/2 At Am and 1  At Pm 4)  Furosemide 40 Mg  Tabs (Furosemide) .... Once Daily 5)  Simvastatin 40 Mg  Tabs (Simvastatin) .... Once At Night 6)  Fexofenadine Hcl 180 Mg  Tabs (Fexofenadine Hcl) .... Once Daily 7)  Aspirin 81 Mg  Tbec (Aspirin) .... Once Daily 8)  Ursodiol 300 Mg  Caps (Ursodiol) .... One By Mouth Two Times A Day 9)  Metformin Hcl 1000 Mg Tabs (Metformin Hcl) .Marland Kitchen.. 1 By Mouth Two Times A Day 10)  Glimepiride 4 Mg Tabs (Glimepiride) .... Take 1 Tablet By Mouth Two Times A Day 11)  Cozaar 100 Mg Tabs (Losartan Potassium) .... One Tablet Two Times A Day 12)  Tandem Plus 162-115.2-1 Mg Caps (Fefum-Fepo-Fa-B Cmp-C-Zn-Mn-Cu) .Marland Kitchen.. 1 Once Daily X 3 Mo. 13)  Actos 45 Mg Tabs (Pioglitazone Hcl) .... Once Daily  Allergies (verified): 1)  ! Ace Inhibitors 2)  ! Neosporin  Past History:  Past Medical History: Last updated: 11/15/2008 Current Problems:  DIABETES MELLITUS (ICD-250.00) HYPERCHOLESTEROLEMIA  (ICD-272.0) HYPERTENSION (ICD-401.9) ANEMIA, NORMOCYTIC (ICD-285.9) BILIARY CIRRHOSIS, PRIMARY (ICD-571.6) DIVERTICULOSIS, COLON, WITH HEMORRHAGE (ICD-562.12) COLONIC POLYPS, HX OF (ICD-V12.72)  Past Surgical History: Last updated: 10/27/2007 Thyroid Surgery partical in the 80s Tubal Ligation 1972  Family History: Last updated: 10/28/2007 No FH of Colon Cancer: Family History of Diabetes:  self sisters brother aunt  Social History: Last updated: 10/28/2007 Patient has never smoked.  Alcohol Use - no Illicit Drug Use - no Patient gets regular exercise.  Vital Signs:  Patient profile:   76 year old female Height:      64 inches Weight:      192 pounds BMI:     33.08 Pulse rate:   65 / minute Pulse rhythm:   regular BP sitting:   132 / 72  (right arm) Cuff size:   large  Vitals Entered By: Doug Sou CMA (November 22, 2009 10:22 AM)  Physical Exam  General:  The patient was alert and oriented in no acute distress. HEENT Normal.  Neck veins were flat, carotids were brisk.  Lungs were clear.  Heart sounds were regular without murmurs or gallops.  Abdomen was soft with active bowel sounds. There is no clubbing cyanosis or edema. Skin Warm and dry    EKG  Procedure date:  11/22/2009  Findings:      sinus rhythm at 63 Intervals 0.20/0.08/.40 otherwise normal  Impression & Recommendations:  Problem # 1:  ATRIAL FIBRILLATION, PAROXYSMAL (ICD-427.31) atrial fibrillation is currently well controlled on her current  medications. We discussed extensively the alternative to Coumadin, i.e. Pradaxa with his potential benefits as well as potential risks. She would like to continue on Coumadin for now.  we'll stop her aspirin Her updated medication list for this problem includes:    Rythmol Sr 325 Mg Cp12 (Propafenone hcl) .Marland Kitchen... Take 1 capsule by mouth every twelve hours    Coumadin 5 Mg Tabs (Warfarin sodium) .Marland Kitchen... Take as directed by coumadin clinic.    Lopressor 50  Mg Tabs (Metoprolol tartrate) .Marland Kitchen... 1/2 at am and 1  at pm    Aspirin 81 Mg Tbec (Aspirin) ..... Once daily  Orders: EKG w/ Interpretation (93000)  Problem # 2:  BRADYCARDIA (ICD-427.89) bradycardia is currently surprisingly well tolerated notwithstanding the double beta blocker. Her updated medication list for this problem includes:    Rythmol Sr 325 Mg Cp12 (Propafenone hcl) .Marland Kitchen... Take 1 capsule by mouth every twelve hours    Coumadin 5 Mg Tabs (Warfarin sodium) .Marland Kitchen... Take as directed by coumadin clinic.    Lopressor 50 Mg Tabs (Metoprolol tartrate) .Marland Kitchen... 1/2 at am and 1  at pm    Aspirin 81 Mg Tbec (Aspirin) ..... Once daily  Problem # 3:  DIABETES MELLITUS (ICD-250.00) Her diabetes may be an issue as relates to her medications and fluid retention. I've asked her to follow with Dr. Rocky Link in short order regarding weight  gain and fluid retention Her updated medication list for this problem includes:    Aspirin 81 Mg Tbec (Aspirin) ..... Once daily    Metformin Hcl 1000 Mg Tabs (Metformin hcl) .Marland Kitchen... 1 by mouth two times a day    Glimepiride 4 Mg Tabs (Glimepiride) .Marland Kitchen... Take 1 tablet by mouth two times a day    Cozaar 100 Mg Tabs (Losartan potassium) ..... One tablet two times a day    Actos 45 Mg Tabs (Pioglitazone hcl) ..... Once daily

## 2010-06-13 NOTE — Medication Information (Signed)
Summary: rov/ewj  Anticoagulant Therapy  Managed by: Tula Nakayama, RN, BSN Referring MD: Virl Axe MD PCP: Gerrit Heck, MD Supervising MD: Lia Foyer MD, Marcello Moores Indication 1: Atrial Fibrillation (ICD-427.31) Lab Used: [facility] Site: [facility] INR POC 1.5 INR RANGE 2 - 3  Dietary changes: no    Health status changes: no    Bleeding/hemorrhagic complications: no    Recent/future hospitalizations: no    Any changes in medication regimen? no    Recent/future dental: no  Any missed doses?: no       Is patient compliant with meds? yes       Allergies: 1)  ! Ace Inhibitors  Anticoagulation Management History:      The patient is taking warfarin and comes in today for a routine follow up visit.  Positive risk factors for bleeding include an age of 70 years or older and presence of serious comorbidities.  The bleeding index is 'intermediate risk'.  Positive CHADS2 values include History of HTN and History of Diabetes.  Negative CHADS2 values include Age > 49 years old.  The start date was 01/13/2006.  Anticoagulation responsible provider: Lia Foyer MD, Marcello Moores.  INR POC: 1.5.  Cuvette Lot#: [id number].  Exp: 06/2010.    Anticoagulation Management Assessment/Plan:      The patient's current anticoagulation dose is Coumadin 5 mg tabs: Take as directed by coumadin clinic..  The target INR is 2.0-3.0.  The next INR is due 05/31/2009.  Anticoagulation instructions were given to patient.  Results were reviewed/authorized by Tula Nakayama, RN, BSN.  She was notified by Tula Nakayama, RN, BSN.         Prior Anticoagulation Instructions: INR 2.9  Pt wishes to decr green leafy vegetable intake, will decr coumadin dosage to 1/2 tablet daily except 1 tablet on Tuesdays and Saturdays.  Recheck in 2 weeks.    Current Anticoagulation Instructions: INR 1.5 Today take 5mg s then change dose to 2.5mg s everyday except 5mg s on Tuesdays, Thursdays, and Saturdays. Recheck in 2 weeks.

## 2010-06-13 NOTE — Progress Notes (Signed)
Summary: calling re rythmol   Phone Note Call from Patient   Summary of Call: needs to talk to nurse re her rythmol pls call [phone] Initial call taken by: Lorenda Hatchet,  January 04, 2010 11:09 AM  Follow-up for Phone Call        pts prescription was called in for generic and she wants brand name she states her dr Caryl Comes have discussed this and since she has done so well on brand name he doesn't want to change her to generic, called [facility] and gave new order for Brand name Rythmol Sara Rosebush, RN  January 04, 2010 11:28 AM

## 2010-06-13 NOTE — Medication Information (Signed)
Summary: rov/cb  Anticoagulant Therapy  Managed by: Tula Nakayama, RN, BSN Referring MD: Virl Axe MD PCP: Gerrit Heck, MD Supervising MD: Rayann Heman MD, Jeneen Rinks Indication 1: Atrial Fibrillation (ICD-427.31) Lab Used: [facility] Site: [facility] INR POC 1.9 INR RANGE 2 - 3  Dietary changes: yes       Details: Slightly more green leafy veggies  Health status changes: no    Bleeding/hemorrhagic complications: no    Recent/future hospitalizations: no    Any changes in medication regimen? no    Recent/future dental: no  Any missed doses?: no       Is patient compliant with meds? yes       Allergies: 1)  ! Ace Inhibitors  Anticoagulation Management History:      The patient is taking warfarin and comes in today for a routine follow up visit.  Positive risk factors for bleeding include an age of 74 years or older and presence of serious comorbidities.  The bleeding index is 'intermediate risk'.  Positive CHADS2 values include History of HTN and History of Diabetes.  Negative CHADS2 values include Age > 31 years old.  The start date was 01/13/2006.  Anticoagulation responsible provider: Cambren Helm MD, Jeneen Rinks.  INR POC: 1.9.  Cuvette Lot#: [id number].  Exp: 12/2010.    Anticoagulation Management Assessment/Plan:      The patient's current anticoagulation dose is Coumadin 5 mg tabs: Take as directed by coumadin clinic..  The target INR is 2.0-3.0.  The next INR is due 10/18/2009.  Anticoagulation instructions were given to patient.  Results were reviewed/authorized by Tula Nakayama, RN, BSN.  She was notified by Tula Nakayama, RN, BSN.         Prior Anticoagulation Instructions: INR 2.6. Take 0.5 tablet daily except 1 tablet on Tues, Thurs, Sat.  Current Anticoagulation Instructions: INR 1.9 Today take take 7.5mg s then resume 2.5mg s everyday except 5mg  on Tuesdays, Thursdays and Saturdays. Recheck in 4 weeks.

## 2010-06-13 NOTE — Medication Information (Signed)
Summary: rov/tm  Anticoagulant Therapy  Managed by: Porfirio Oar, PharmD Referring MD: Virl Axe MD PCP: Gerrit Heck, MD Supervising MD: Lovena Le MD, Carleene Overlie Indication 1: Atrial Fibrillation (ICD-427.31) Lab Used: [facility] Site: [facility] INR POC 2.0 INR RANGE 2 - 3  Dietary changes: no    Health status changes: no    Bleeding/hemorrhagic complications: no    Recent/future hospitalizations: no    Any changes in medication regimen? no    Recent/future dental: no  Any missed doses?: no       Is patient compliant with meds? yes       Allergies: 1)  ! Ace Inhibitors 2)  ! Neosporin  Anticoagulation Management History:      The patient is taking warfarin and comes in today for a routine follow up visit.  Positive risk factors for bleeding include an age of 74 years or older and presence of serious comorbidities.  The bleeding index is 'intermediate risk'.  Positive CHADS2 values include History of HTN and History of Diabetes.  Negative CHADS2 values include Age > 19 years old.  The start date was 01/13/2006.  Anticoagulation responsible provider: Lovena Le MD, Carleene Overlie.  INR POC: 2.0.  Cuvette Lot#: [id number].  Exp: 05/2011.    Anticoagulation Management Assessment/Plan:      The patient's current anticoagulation dose is Coumadin 5 mg tabs: Take as directed by coumadin clinic..  The target INR is 2.0-3.0.  The next INR is due 06/27/2010.  Anticoagulation instructions were given to patient.  Results were reviewed/authorized by Porfirio Oar, PharmD.  She was notified by Nilda Simmer, PharmD Candidate .         Prior Anticoagulation Instructions: INR 2.1 Continue 5mg s everyday except 2.5mg s on Mondays, Wednesdays and Fridays. Recheck in 4 weeks.   Current Anticoagulation Instructions: INR 2.0  Coumadin 5 mg tablets - Continue 1 whole tablet every day except 1/2 tablet oN mondays, Wednesdays and Fridays

## 2010-06-13 NOTE — Medication Information (Signed)
Summary: rov/sp  Anticoagulant Therapy  Managed by: Tula Nakayama, RN, BSN Referring MD: Virl Axe MD PCP: Gerrit Heck, MD Supervising MD: Olevia Perches MD, Caly Pellum Indication 1: Atrial Fibrillation (ICD-427.31) Lab Used: [facility] Site: [facility] INR POC 2.1 INR RANGE 2 - 3  Dietary changes: no    Health status changes: no    Bleeding/hemorrhagic complications: no    Recent/future hospitalizations: no    Any changes in medication regimen? no    Recent/future dental: no  Any missed doses?: no       Is patient compliant with meds? yes       Allergies: 1)  ! Ace Inhibitors 2)  ! Neosporin  Anticoagulation Management History:      The patient is taking warfarin and comes in today for a routine follow up visit.  Positive risk factors for bleeding include an age of 74 years or older and presence of serious comorbidities.  The bleeding index is 'intermediate risk'.  Positive CHADS2 values include History of HTN and History of Diabetes.  Negative CHADS2 values include Age > 45 years old.  The start date was 01/13/2006.  Anticoagulation responsible provider: Olevia Perches MD, Darnell Level.  INR POC: 2.1.  Cuvette Lot#: [id number].  Exp: 05/2011.    Anticoagulation Management Assessment/Plan:      The patient's current anticoagulation dose is Coumadin 5 mg tabs: Take as directed by coumadin clinic..  The target INR is 2.0-3.0.  The next INR is due 05/30/2010.  Anticoagulation instructions were given to patient.  Results were reviewed/authorized by Tula Nakayama, RN, BSN.  She was notified by Tula Nakayama, RN, BSN.         Prior Anticoagulation Instructions: INR 2.1  Continue same dose of 1 tablet every day except 1/2 tablet on Monday, Wednesday and Friday.  Recheck INR in 4 weeks.   Current Anticoagulation Instructions: INR 2.1 Continue 5mg s everyday except 2.5mg s on Mondays, Wednesdays and Fridays. Recheck in 4 weeks.

## 2010-06-13 NOTE — Medication Information (Signed)
Summary: rov/tm  Anticoagulant Therapy  Managed by: Porfirio Oar, PharmD Referring MD: Virl Axe MD PCP: Gerrit Heck, MD Supervising MD: Percival Spanish MD, Jeneen Rinks Indication 1: Atrial Fibrillation (ICD-427.31) Lab Used: [facility] Site: [facility] INR POC 2.7 INR RANGE 2 - 3  Dietary changes: no    Health status changes: no    Bleeding/hemorrhagic complications: no    Recent/future hospitalizations: no    Any changes in medication regimen? no    Recent/future dental: no  Any missed doses?: no       Is patient compliant with meds? yes       Allergies (verified): 1)  ! Ace Inhibitors  Anticoagulation Management History:      The patient is taking warfarin and comes in today for a routine follow up visit.  Positive risk factors for bleeding include an age of 74 years or older and presence of serious comorbidities.  The bleeding index is 'intermediate risk'.  Positive CHADS2 values include History of HTN and History of Diabetes.  Negative CHADS2 values include Age > 74 years old.  The start date was 01/13/2006.  Anticoagulation responsible provider: Percival Spanish MD, Jeneen Rinks.  INR POC: 2.7.  Exp: 03/12/2010.    Anticoagulation Management Assessment/Plan:      The patient's current anticoagulation dose is Coumadin 5 mg tabs: Take as directed by coumadin clinic..  The target INR is 2.0-3.0.  The next INR is due 04/26/2009.  Anticoagulation instructions were given to patient.  Results were reviewed/authorized by Porfirio Oar, PharmD.  She was notified by Mallie Mussel, PharmD Candidate.         Prior Anticoagulation Instructions: INR 2.4 Continue 2.5mg s daily except 5mg s on Tuesdays, Thursdays and Saturdays, Recheck in 4 wks.   Current Anticoagulation Instructions: INR 2.7  Continue to take 1/2 tablet every day except on Tuesday, Thursday and Saturday take 1 tablet. Recheck INR in 4 weeks.

## 2010-06-13 NOTE — Letter (Signed)
Summary: [facility]: Immunoassay Fecal Occult Blood (iFOB) Order [facility]  [address] [place], [place] [address]   Phone: [phone]  Fax: [phone]      [facility]: Immunoassay Fecal Occult Blood (iFOB) Order Form   October 26, 2008 MRN: [id number]   Sara Hancock 03-15-1935   Physicican Name: Dr. Verl Blalock  Diagnosis Code: 571.6      Bernita Buffy CMA

## 2010-06-13 NOTE — Medication Information (Signed)
Summary: rov/tm  Anticoagulant Therapy  Managed by: Porfirio Oar, PharmD Referring MD: Virl Axe MD PCP: Gerrit Heck, MD Supervising MD: Lia Foyer MD, Marcello Moores Indication 1: Atrial Fibrillation (ICD-427.31) Lab Used: [facility] Site: [facility] INR POC 3.4 INR RANGE 2 - 3  Dietary changes: no    Health status changes: no    Bleeding/hemorrhagic complications: no    Recent/future hospitalizations: no    Any changes in medication regimen? yes       Details: started septra on 7/20, will finish on 7/27  Recent/future dental: no  Any missed doses?: no       Is patient compliant with meds? yes       Allergies: 1)  ! Ace Inhibitors 2)  ! Neosporin  Anticoagulation Management History:      The patient is taking warfarin and comes in today for a routine follow up visit.  Positive risk factors for bleeding include an age of 32 years or older and presence of serious comorbidities.  The bleeding index is 'intermediate risk'.  Positive CHADS2 values include History of HTN and History of Diabetes.  Negative CHADS2 values include Age > 35 years old.  The start date was 01/13/2006.  Anticoagulation responsible provider: Lia Foyer MD, Marcello Moores.  INR POC: 3.4.  Cuvette Lot#: [id number].  Exp: 02/2011.    Anticoagulation Management Assessment/Plan:      The patient's current anticoagulation dose is Coumadin 5 mg tabs: Take as directed by coumadin clinic..  The target INR is 2.0-3.0.  The next INR is due 12/19/2009.  Anticoagulation instructions were given to patient.  Results were reviewed/authorized by Porfirio Oar, PharmD.  She was notified by Lind Covert.         Prior Anticoagulation Instructions: INR 2.3  Continue on same dosage 1 tablet daily except 1/2 tablet on Mondays, Wednesdays, and Fridays.  Recheck in 3 weeks.  Call [phone] and give [place] the name of the abx you are currently on.   Current Anticoagulation Instructions: INR 3.4  Hold coumadin today and then take 1/2 tab tomorrow.   Then resume normal regimen of 1/2 tab on Monday, Wednesday, and Friday and 1 tab Sunday, Tuesday, Thursday, and Saturday.  Re-check in 3 weeks.

## 2010-06-13 NOTE — Assessment & Plan Note (Signed)
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 She denies abdominal pain, nausea vomiting, pruritus, mental status changes, chronic fatigue, abdominal swelling peripheral edema, melena or hematochezia. Appetite is good weight is stable.  She is followed by Dr. Rocky Link for her diabetes and is on Avandia 4 mg a day is having financial problems with this medication and would like to go on a generic medication and I will suggest to Dr. Rocky Link that she perhaps go on generic metformin. She also has chronic atrial fibrillation his own Coumadin and numerous cardiac medications. She is up-to-date on her colonoscopy which was last done in March of 2005 and she has never had any evidence of liver failure or portal hypertension. She denies abuse of alcohol completely.   GI Review of Systems      Denies abdominal pain, acid reflux, belching, bloating, chest pain, dysphagia with liquids, dysphagia with solids, heartburn, loss of appetite, nausea, vomiting, vomiting blood, weight loss, and  weight gain.        Denies anal fissure, black tarry stools, change in bowel habit, constipation, diarrhea, diverticulosis, fecal incontinence, heme positive stool, hemorrhoids, irritable bowel syndrome, jaundice, light color stool, liver problems, rectal bleeding, and  rectal pain.     Prior Medications Reviewed Using: List Brought by Patient  Updated Prior Medication List: RYTHMOL SR 325 MG CP12 (PROPAFENONE HCL) Take 1 capsule by mouth every twelve hours COUMADIN 2.5 MG  TABS (WARFARIN SODIUM) as directed AVANDIA 4 MG  TABS (ROSIGLITAZONE MALEATE) once  daily LOPRESSOR 50 MG  TABS (METOPROLOL TARTRATE) 1/2 at am and 1/2  at pm FUROSEMIDE 40 MG  TABS (FUROSEMIDE) once daily SIMVASTATIN 40 MG  TABS (SIMVASTATIN) once at night FEXOFENADINE HCL 180 MG  TABS (FEXOFENADINE HCL) once daily CLIDINIUM-CHLORDIAZEPOXIDE 2.5-5 MG  CAPS (CLIDINIUM-CHLORDIAZEPOXIDE) as needed ASPIRIN 81 MG  TBEC (ASPIRIN) once daily MICARDIS 80 MG  TABS (TELMISARTAN) once daily  Current Allergies (reviewed today): ! ACE INHIBITORS  Past Medical History:    Reviewed history from 10/27/2007 and no changes required:       Current Problems:        DIABETES MELLITUS (ICD-250.00)       HYPERCHOLESTEROLEMIA (ICD-272.0)       HYPERTENSION (ICD-401.9)       ANEMIA, NORMOCYTIC (ICD-285.9)       BILIARY CIRRHOSIS, PRIMARY (ICD-571.6)       DIVERTICULOSIS, COLON, WITH HEMORRHAGE (ICD-562.12)       COLONIC POLYPS, HX OF (ICD-V12.72)         Past Surgical History:    Reviewed history from 10/27/2007 and no changes required:       Thyroid Surgery partical in the 80s       Tubal Ligation 1972   Family History:    Reviewed history and no changes required:       No FH of Colon Cancer:       Family History of Diabetes:  self sisters brother aunt  Social History:    Reviewed history and no changes required:       Patient has never smoked.        Alcohol Use - no  Illicit Drug Use - no       Patient gets regular exercise.   Risk Factors:  Tobacco use:  never Drug use:  no Alcohol use:  no Exercise:  yes   Review of Systems  The patient denies allergy/sinus, anemia, anxiety-new, arthritis/joint pain, back pain, blood in urine, breast changes/lumps, change in vision, confusion, cough, coughing up blood, depression-new, fainting, fatigue, fever, headaches-new, hearing problems, heart murmur, heart rhythm changes, itching, menstrual pain, muscle pains/cramps, night sweats, nosebleeds, pregnancy symptoms, shortness of breath, skin rash, sleeping problems, sore  throat, swelling of feet/legs, swollen lymph glands, thirst - excessive , urination - excessive , urination changes/pain, urine leakage, vision changes, and voice change.     Vital Signs:  Patient Profile:   76 Years Old Female Height:     64 inches Weight:      190.13 pounds BMI:     32.75 Pulse rate:   64 / minute Pulse rhythm:   regular BP sitting:   118 / 72  (left arm)  Vitals Entered By: [place] (October 28, 2007 10:23 AM)                  Physical Exam  General:     Well developed, well nourished, no acute distress. Head:     Normocephalic and atraumatic. Eyes:     PERRLA, no icterus.exam deferred to patient's ophthalmologist.   Neck:     Supple; no masses or thyromegaly. Chest Wall:     Symmetrical;  no deformities or tenderness. Lungs:     Clear throughout to auscultation. Heart:     Regular rate and rhythm; no murmurs, rubs,  or bruits. Abdomen:     Soft, nontender and nondistended. No masses, hepatosplenomegaly or hernias noted. Normal bowel sounds. Extremities:     No clubbing, cyanosis, edema or deformities noted. Neurologic:     Alert and  oriented x4;  grossly normal neurologically. Psych:     Alert and cooperative. Normal mood and affect.    Impression & Recommendations:  Problem # 1:  BILIARY CIRRHOSIS, PRIMARY (ICD-571.6) Assessment: Unchanged She is asymptomatic and doing well taking Urso 300 mg twice a day. We have asked her to continue yearly GI followup with yearly CBC and liver profiles. I would ordinarily repeat her liver biopsy at this time as she is anticoagulated on Coumadin and this does not seem to be an option at this point. Her liver enzymes are normal and she is maintaining her albumin is fairly steady level and has no physical exam stigmata of chronic liver disease.  Problem # 2:  DIVERTICULOSIS, COLON, WITH HEMORRHAGE (ICD-562.12) Assessment: Unchanged continue high-fiber diet as tolerated with fiber supplements as  needed.  Problem # 3:  DIABETES MELLITUS (ICD-250.00) Assessment: Unchanged with her cardiovascular history I will suggest to Dr. Rocky Link that she discontinue her Avandia and consider metformin or other diabetic medications that the patient can afford in an generic form.  Problem # 4:  COLONIC POLYPS, HX OF (ICD-V12.72) Assessment: Unchanged she is due for colonoscopy followup in March 2010.   Patient Instructions: 1)  Copy Sent To:Dr. Gerrit Heck in [facility]. 2)  Please schedule a follow-up appointment in 1 year. 3)  [place] panel in 1 one year.    Prescriptions: URSODIOL 300 MG  CAPS (URSODIOL) one by mouth two times a day  #60 x 11   Entered by:   Bernita Buffy CMA   Authorized by:   Loralee Pacas  Sharlett Iles MD [facility]   Signed by:   Bernita Buffy CMA on 10/28/2007   Method used:   [facility] ...       [facility] [address]       [place], [place]  [address]       Ph: [phone] or [phone]       Fax: [phone]   RxID:   [id number]  ]

## 2010-06-13 NOTE — Progress Notes (Signed)
Summary: refill   Phone Note Refill Request Message from:  Patient on December 07, 2009 4:00 PM  Refills Requested: Medication #1:  RYTHMOL SR 325 MG CP12 Take 1 capsule by mouth every twelve hours [facility] [phone]  Initial call taken by: Delsa Sale,  December 07, 2009 4:00 PM    Prescriptions: RYTHMOL SR 325 MG CP12 (PROPAFENONE HCL) Take 1 capsule by mouth every twelve hours  #60 Capsule x 4   Entered by:   Doug Sou CMA   Authorized by:   Nikki Dom, MD, [facility]   Signed by:   Doug Sou CMA on 12/07/2009   Method used:   Electronically to        [facility]. [phone]* (retail)       [address]       [place], [place]  [address]       Ph: [phone] or [phone]       Fax: [phone]   RxID:   [id number]   Appended Document: refill Approved x 2 with refills.  Pharmacy stated they had not recvd.  Confirmed dosage and resent. Doug Sou, [place] 12/07/2009

## 2010-06-13 NOTE — Procedures (Signed)
Summary: Gastroenterology colon  Gastroenterology colon   Imported By: Bernita Buffy CMA 10/27/2007 11:43:57  _____________________________________________________________________  External Attachment:    Type:   Image     Comment:   External Document

## 2010-06-13 NOTE — Medication Information (Signed)
Summary: rov  js  Anticoagulant Therapy  Managed by: Alinda Deem PharmD, BCPS, CPP Referring MD: Virl Axe MD PCP: Gerrit Heck, MD Supervising MD: Dorris Carnes MD Indication 1: Atrial Fibrillation (ICD-427.31) Lab Used: [facility] 20.2 INR POC 2.8 INR RANGE 2 - 3  Dietary changes: no    Health status changes: no    Bleeding/hemorrhagic complications: no    Recent/future hospitalizations: no    Any changes in medication regimen? no    Recent/future dental: no  Any missed doses?: no       Is patient compliant with meds? yes       Allergies (verified): 1)  ! Ace Inhibitors  Anticoagulation Management History:      The patient is on coumadin and comes in today for a routine follow up visit.  Positive risk factors for bleeding include an age of 75 years or older and presence of serious comorbidities.  The bleeding index is 'intermediate risk'.  Positive CHADS2 values include History of HTN and History of Diabetes.  Negative CHADS2 values include Age > 71 years old.  The start date was 01/13/2006.    Anticoagulation Management Assessment/Plan:      The patient's current anticoagulation dose is Coumadin 2.5 mg  tabs: as directed.  The target INR is 2.0-3.0.  She is to have a 11/30/2008.  Anticoagulation instructions were given to patient.  Results were reviewed/authorized by Alinda Deem PharmD, BCPS, CPP.  She was notified by Maryella Shivers, PharmD Candidate.         Prior Anticoagulation Instructions: 2.5 mg today (5/29) then resume 5 mg TTSa and 2.5 mg other days.  increase greens  Current Anticoagulation Instructions: INR 2.8  The patient is to continue with the same dose of coumadin.  This dosage includes:  1/2 tablet (2.5 mg) every day, except 1 tablet (5 mg) on Tuesday, Thursday, and Saturday

## 2010-06-13 NOTE — Medication Information (Signed)
Summary: rov/tm  Anticoagulant Therapy  Managed by: Tula Nakayama, RN, BSN Referring MD: Virl Axe MD PCP: Gerrit Heck, MD Supervising MD: Burt Knack MD, Legrand Como Indication 1: Atrial Fibrillation (ICD-427.31) Lab Used: [facility] Site: [facility] INR POC 2.4 INR RANGE 2 - 3  Dietary changes: no    Health status changes: no    Bleeding/hemorrhagic complications: no    Recent/future hospitalizations: no    Any changes in medication regimen? no    Recent/future dental: no  Any missed doses?: no       Is patient compliant with meds? yes       Allergies: 1)  ! Ace Inhibitors  Anticoagulation Management History:      The patient is taking warfarin and comes in today for a routine follow up visit.  Positive risk factors for bleeding include an age of 17 years or older and presence of serious comorbidities.  The bleeding index is 'intermediate risk'.  Positive CHADS2 values include History of HTN and History of Diabetes.  Negative CHADS2 values include Age > 71 years old.  The start date was 01/13/2006.  Anticoagulation responsible provider: Burt Knack MD, Legrand Como.  INR POC: 2.4.  Cuvette Lot#: [id number].  Exp: 03/12/2010.    Anticoagulation Management Assessment/Plan:      The patient's current anticoagulation dose is Coumadin 5 mg tabs: Take as directed by coumadin clinic..  The target INR is 2.0-3.0.  The next INR is due 03/29/2009.  Anticoagulation instructions were given to patient.  Results were reviewed/authorized by Tula Nakayama, RN, BSN.  She was notified by Tula Nakayama, RN, BSN.         Prior Anticoagulation Instructions: INR 3.0  Continue same dosing schedule below.  Increase amount of greens in diet.   Current Anticoagulation Instructions: INR 2.4 Continue 2.5mg s daily except 5mg s on Tuesdays, Thursdays and Saturdays, Recheck in 4 wks.

## 2010-06-13 NOTE — Medication Information (Signed)
Summary: Sara Hancock  Anticoagulant Therapy  Managed by: Freddrick March, RN, BSN Referring MD: Virl Axe MD PCP: Gerrit Heck, MD Supervising MD: Verl Blalock MD, Marcello Moores Indication 1: Atrial Fibrillation (ICD-427.31) Lab Used: [facility] Site: [facility] INR POC 2.2 INR RANGE 2 - 3  Dietary changes: no    Health status changes: no    Bleeding/hemorrhagic complications: no    Recent/future hospitalizations: no    Any changes in medication regimen? no    Recent/future dental: no  Any missed doses?: no       Is patient compliant with meds? yes       Allergies (verified): 1)  ! Ace Inhibitors  Anticoagulation Management History:      The patient is taking warfarin and comes in today for a routine follow up visit.  Positive risk factors for bleeding include an age of 74 years or older and presence of serious comorbidities.  The bleeding index is 'intermediate risk'.  Positive CHADS2 values include History of HTN and History of Diabetes.  Negative CHADS2 values include Age > 64 years old.  The start date was 01/13/2006.  Anticoagulation responsible provider: Verl Blalock MD, Marcello Moores.  INR POC: 2.2.  Cuvette Lot#: [id number].  Exp: 08/2010.    Anticoagulation Management Assessment/Plan:      The patient's current anticoagulation dose is Coumadin 5 mg tabs: Take as directed by coumadin clinic..  The target INR is 2.0-3.0.  The next INR is due 07/19/2009.  Anticoagulation instructions were given to patient.  Results were reviewed/authorized by Freddrick March, RN, BSN.  She was notified by Freddrick March RN.         Prior Anticoagulation Instructions: INR: 2.0 Continue with same dosage of 2.5mg  tablet daily except 5mg  on Tuesdays, Thursdays and Saturdays Recheck in 3 weeks  Current Anticoagulation Instructions: INR 2.2  Continue on same dosage 1/2 tablet daily except 1 tablet on Tuesdays, Thursdays, and Saturdays.  Recheck in 4 weeks.

## 2010-06-13 NOTE — Medication Information (Signed)
Summary: rov/tm   Anticoagulant Therapy  Managed by: Merdis Delay, RN Referring MD: Virl Axe MD PCP: Gerrit Heck, MD Supervising MD: Harrington Challenger MD, Nevin Bloodgood Indication 1: Atrial Fibrillation (ICD-427.31) Lab Used: [facility] Site: [facility] INR POC 1.8 INR RANGE 2 - 3  Dietary changes: yes       Details: slight increase in greens  Health status changes: no    Bleeding/hemorrhagic complications: no    Recent/future hospitalizations: no    Any changes in medication regimen? no    Recent/future dental: no  Any missed doses?: no       Is patient compliant with meds? yes       Allergies: 1)  ! Ace Inhibitors  Anticoagulation Management History:      The patient is taking warfarin and comes in today for a routine follow up visit.  Positive risk factors for bleeding include an age of 72 years or older and presence of serious comorbidities.  The bleeding index is 'intermediate risk'.  Positive CHADS2 values include History of HTN and History of Diabetes.  Negative CHADS2 values include Age > 29 years old.  The start date was 01/13/2006.  Anticoagulation responsible provider: Harrington Challenger MD, Nevin Bloodgood.  INR POC: 1.8.  Cuvette Lot#: [id number].  Exp: 12/2010.    Anticoagulation Management Assessment/Plan:      The patient's current anticoagulation dose is Coumadin 5 mg tabs: Take as directed by coumadin clinic..  The target INR is 2.0-3.0.  The next INR is due 11/15/2009.  Anticoagulation instructions were given to patient.  Results were reviewed/authorized by Merdis Delay, RN.  She was notified by Merdis Delay, RN, BSN.         Prior Anticoagulation Instructions: INR 1.9 Today take take 7.5mg s then resume 2.5mg s everyday except 5mg  on Tuesdays, Thursdays and Saturdays. Recheck in 4 weeks.   Current Anticoagulation Instructions: The patient is to continue with the same dose of coumadin.  This dosage includes:  Take an extra half of a tablet today then resume half of a tablet  every day except a whole tablet on Tuesdays, Thursdays & Saturdays.

## 2010-06-13 NOTE — Progress Notes (Signed)
Summary: REFILL   Phone Note Refill Request Message from:  Patient on February 01, 2009 10:59 AM  Refills Requested: Medication #1:  COUMADIN 5 MG TABS Take as directed by coumadin clinic[id number]  Initial call taken by: Delsa Sale,  February 01, 2009 11:00 AM  Follow-up for Phone Call        Called spoke with pt advised rx refill sent to pharmacy as requested.   Follow-up by: Freddrick March RN,  February 02, 2009 3:57 PM    Prescriptions: COUMADIN 5 MG TABS (WARFARIN SODIUM) Take as directed by coumadin clinic.  #30 x 3   Entered by:   Freddrick March RN   Authorized by:   Nikki Dom, MD, [facility]   Signed by:   Freddrick March RN on 02/02/2009   Method used:   Electronically to        [facility]. [phone]* (retail)       [address]       [place], [place]  [address]       Ph: [phone] or [phone]       Fax: [phone]   RxID:   [id number]   Appended Document: REFILL    Prescriptions: COUMADIN 5 MG TABS (WARFARIN SODIUM) Take as directed by coumadin clinic.  #90 x 3   Entered by:   Freddrick March RN   Authorized by:   Nikki Dom, MD, [facility]   Signed by:   Freddrick March RN on 02/02/2009   Method used:   Electronically to        [facility]. [phone]* (retail)       [address]       [place], [place]  [address]       Ph: [phone] or [phone]       Fax: [phone]   RxID:   [id number]

## 2010-06-13 NOTE — Medication Information (Signed)
Summary: Sara Hancock  Anticoagulant Therapy  Managed by: Tula Nakayama, RN, BSN Referring MD: Virl Axe MD PCP: Gerrit Heck, MD Supervising MD: Verl Blalock MD, Marcello Moores Indication 1: Atrial Fibrillation (ICD-427.31) Lab Used: [facility] Site: [facility] INR POC 1.8 INR RANGE 2 - 3  Dietary changes: yes       Details: Ate few extra leafy veggies  Health status changes: no    Bleeding/hemorrhagic complications: no    Recent/future hospitalizations: no    Any changes in medication regimen? no    Recent/future dental: no  Any missed doses?: no       Is patient compliant with meds? yes       Allergies: 1)  ! Ace Inhibitors 2)  ! Neosporin  Anticoagulation Management History:      The patient is taking warfarin and comes in today for a routine follow up visit.  Positive risk factors for bleeding include an age of 74 years or older and presence of serious comorbidities.  The bleeding index is 'intermediate risk'.  Positive CHADS2 values include History of HTN and History of Diabetes.  Negative CHADS2 values include Age > 74 years old.  The start date was 01/13/2006.  Anticoagulation responsible provider: Verl Blalock MD, Marcello Moores.  INR POC: 1.8.  Cuvette Lot#: [id number].  Exp: 04/2011.    Anticoagulation Management Assessment/Plan:      The patient's current anticoagulation dose is Coumadin 5 mg tabs: Take as directed by coumadin clinic..  The target INR is 2.0-3.0.  The next INR is due 04/05/2010.  Anticoagulation instructions were given to patient.  Results were reviewed/authorized by Tula Nakayama, RN, BSN.  She was notified by Tula Nakayama, RN, BSN.         Prior Anticoagulation Instructions: INR 2.3  Continue on same dosage 1 tablet daily except 1/2 tablet on Mondays, Wednesdays, and Fridays.  Recheck in 4 weeks.    Current Anticoagulation Instructions: INR 1.8 Today take 7.5mg s then resume 5mg s everyday except 2.5mg s on Mondays, Wednesdays and Fridays. Recheck in 3 weeks.

## 2010-06-13 NOTE — Assessment & Plan Note (Signed)
Summary: annual check up..em    History of Present Illness Visit Type: follow up Primary GI MD: Verl Blalock MD FACP [place] Primary Provider: Gerrit Heck, MD Requesting Provider: N/A Chief Complaint:  f/u for cirrhosis. pt said that she is doing good History of Present Illness:   This patient is a 76 year old white female with stable primary biliary cirrhosis and chronic Urso300 mg a day. She denies any hepatobiliary or general medical complaints. She does have chronic atrial fibrillation and is chronically anticoagulated.  He specifically denies abdominal pain, distention, peripheral edema, expanding abdominal girth, but she has developed a umbilical hernia. Her mental status has been normal. She does not abuse Tylenol or aspirin. She is on the care of Dr. Gerrit Heck and aspirin. Her diabetes apparently is under good control on metformin and Glimepiridedaily.   GI Review of Systems      Denies abdominal pain, acid reflux, belching, bloating, chest pain, dysphagia with liquids, dysphagia with solids, heartburn, loss of appetite, nausea, vomiting, vomiting blood, weight loss, and  weight gain.        Denies anal fissure, black tarry stools, change in bowel habit, constipation, diarrhea, diverticulosis, fecal incontinence, heme positive stool, hemorrhoids, irritable bowel syndrome, jaundice, light color stool, liver problems, rectal bleeding, and  rectal pain.    Current Medications (verified): 1)  Rythmol Sr 325 Mg Cp12 (Propafenone Hcl) .... Take 1 Capsule By Mouth Every Twelve Hours 2)  Coumadin 2.5 Mg  Tabs (Warfarin Sodium) .... As Directed 3)  Lopressor 50 Mg  Tabs (Metoprolol Tartrate) .... 1/2 At Am and 1/2  At Pm 4)  Furosemide 40 Mg  Tabs (Furosemide) .... Once Daily 5)  Simvastatin 40 Mg  Tabs (Simvastatin) .... Once At Night 6)  Fexofenadine Hcl 180 Mg  Tabs (Fexofenadine Hcl) .... Once Daily 7)  Clidinium-Chlordiazepoxide 2.5-5 Mg  Caps  (Clidinium-Chlordiazepoxide) .... As Needed 8)  Aspirin 81 Mg  Tbec (Aspirin) .... Once Daily 9)  Ursodiol 300 Mg  Caps (Ursodiol) .... One By Mouth Two Times A Day 10)  Metformin Hcl 500 Mg Tabs (Metformin Hcl) .... One Tablet By Mouth Two Times A Day 11)  Glimepiride 2 Mg Tabs (Glimepiride) .... One Tablet By Mouth Once Daily  Allergies (verified): 1)  ! Ace Inhibitors  Past History:  Past medical, surgical, family and social histories (including risk factors) reviewed for relevance to current acute and chronic problems.  Past Medical History: Reviewed history from 10/27/2007 and no changes required. 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 Thyroid Surgery partical in the 80s Tubal Ligation 1972  Family History: Reviewed history from 10/28/2007 and no changes required. No FH of Colon Cancer: Family History of Diabetes:  self sisters brother aunt  Social History: Reviewed history from 10/28/2007 and no changes required. Patient has never smoked.  Alcohol Use - no Illicit Drug Use - no Patient gets regular exercise.  Review of Systems  The patient denies allergy/sinus, anemia, anxiety-new, arthritis/joint pain, back pain, blood in urine, breast changes/lumps, change in vision, confusion, cough, coughing up blood, depression-new, fainting, fatigue, fever, headaches-new, hearing problems, heart murmur, heart rhythm changes, itching, menstrual pain, muscle pains/cramps, night sweats, nosebleeds, pregnancy symptoms, shortness of breath, skin rash, sleeping problems, sore throat, swelling of feet/legs, swollen lymph glands, thirst - excessive , urination - excessive ,  urination  changes/pain, urine leakage, vision changes, and voice change.    Vital Signs:  Patient profile:   76 year old female Height:      64 inches Weight:      186 pounds BMI:     32.04 BSA:     1.90 Pulse rate:   72 / minute Pulse rhythm:   regular BP sitting:   130 / 80  (left arm) Cuff size:   regular  Vitals Entered By: Hope Pigeon CMA (October 26, 2008 11:20 AM)  Physical Exam  General:  Well developed, well nourished, no acute distress.healthy appearing.   Head:  Normocephalic and atraumatic. Eyes:  PERRLA, no icterus.exam deferred to patient's ophthalmologist.   Lungs:  Clear throughout to auscultation. Heart:  Regular rate and rhythm; no murmurs, rubs,  or bruits. Abdomen:  Soft, nontender and nondistended. No masses, hepatosplenomegaly or hernias noted. Normal bowel sounds.She does have an umbilical hernia but I cannot appreciate any abdominal masses or ascites. Msk:  Symmetrical with no gross deformities. Normal posture. Pulses:  Normal pulses noted. Extremities:  No clubbing, cyanosis, edema or deformities noted. Neurologic:  Alert and  oriented x4;  grossly normal neurologically.There is no asterixis or mental status changes noted. Psych:  Alert and cooperative. Normal mood and affect.   Impression & Recommendations:  Problem # 1:  DIABETES MELLITUS (ICD-250.00) Assessment Improved Continue multiple oral meds per primary care  Problem # 2:  BILIARY CIRRHOSIS, PRIMARY (ICD-571.6) Assessment: Unchanged There is no evidence of progression of her very mild primary biliary cirrhosis. I ordered an ultrasound of the abdomen to visualize her liver and also exclude ascites. I've also ordered repeat AMA level. Orders: T-AMA [id number])  Problem # 3:  GALLSTONES (ICD-574.20) Assessment: Unchanged She has known asymptomatic gallstones. Repeat ultrasound has been ordered.  Problem # 4:  COLONIC POLYPS, HX OF (ICD-V12.72) Assessment: Unchanged She is up-to-date on colonoscopy  and is due for followup in 2012.  Patient Instructions: 1)  Copy sent to : Dr. Gerrit Heck 2)  Please continue current medications.  3)  AMA ordered 4)  Ultrasound ordered 5)  Yearly followup unless otherwise indicated  Appended Document: Orders Update    Clinical Lists Changes  Orders: Added new Test order of Ultrasound Abdomen (UAS) - Signed

## 2010-06-13 NOTE — Medication Information (Signed)
Summary: Sara Hancock  Anticoagulant Therapy  Managed by: Porfirio Oar, PharmD Referring MD: Virl Axe MD PCP: Gerrit Heck, MD Supervising MD: Stanford Breed MD, Aaron Edelman Indication 1: Atrial Fibrillation (ICD-427.31) Lab Used: [facility] Site: [facility] INR POC 3.0 INR RANGE 2 - 3  Dietary changes: no    Health status changes: no    Bleeding/hemorrhagic complications: no    Recent/future hospitalizations: no    Any changes in medication regimen? no    Recent/future dental: no  Any missed doses?: no       Is patient compliant with meds? yes       Allergies (verified): 1)  ! Ace Inhibitors  Anticoagulation Management History:      The patient is taking warfarin and comes in today for a routine follow up visit.  Positive risk factors for bleeding include an age of 74 years or older and presence of serious comorbidities.  The bleeding index is 'intermediate risk'.  Positive CHADS2 values include History of HTN and History of Diabetes.  Negative CHADS2 values include Age > 63 years old.  The start date was 01/13/2006.  Anticoagulation responsible provider: Stanford Breed MD, Aaron Edelman.  INR POC: 3.0.  Cuvette Lot#: [id number].  Exp: 03/12/2010.    Anticoagulation Management Assessment/Plan:      The patient's current anticoagulation dose is Coumadin 5 mg tabs: Take as directed by coumadin clinic..  The target INR is 2.0-3.0.  The next INR is due 02/22/2009.  Anticoagulation instructions were given to patient.  Results were reviewed/authorized by Porfirio Oar, PharmD.  She was notified by Harlene Salts, PharmD candidate.         Prior Anticoagulation Instructions: INR 2.8  Continue on same dosage 1 tablet on Tuesdays, Thursdays, and Saturdays and 1/2 tablet all other days.  Recheck in 4 weeks.    Current Anticoagulation Instructions: INR 3.0  Continue same dosing schedule below.  Increase amount of greens in diet.

## 2010-06-13 NOTE — Assessment & Plan Note (Signed)
Summary: PER CHECK OUT/SF  Medications Added COZAAR 100 MG TABS (LOSARTAN POTASSIUM) ONE TABLET two times a day      Allergies Added:   Referring Provider:  n/a Primary Provider:  Gerrit Heck, MD  CC:  ROV/1 YEAR.  History of Present Illness: Sara Hancock is seen in followup for atrial fibrillation that is paroxysmal. It occurs in the setting of background bradycardia and mild first-degree AV block. She is doing quite well without recurrent tachypalpitations. She denies exercise intolerance chest pain or shortness of breath there. There is no dizziness  Current Medications (verified): 1)  Rythmol Sr 325 Mg Cp12 (Propafenone Hcl) .... Take 1 Capsule By Mouth Every Twelve Hours 2)  Coumadin 2.5 Mg  Tabs (Warfarin Sodium) .... As Directed 3)  Lopressor 50 Mg  Tabs (Metoprolol Tartrate) .... 1/2 At Am and 1/2  At Pm 4)  Furosemide 40 Mg  Tabs (Furosemide) .... Once Daily 5)  Simvastatin 40 Mg  Tabs (Simvastatin) .... Once At Night 6)  Fexofenadine Hcl 180 Mg  Tabs (Fexofenadine Hcl) .... Once Daily 7)  Clidinium-Chlordiazepoxide 2.5-5 Mg  Caps (Clidinium-Chlordiazepoxide) .... As Needed 8)  Aspirin 81 Mg  Tbec (Aspirin) .... Once Daily 9)  Ursodiol 300 Mg  Caps (Ursodiol) .... One By Mouth Two Times A Day 10)  Metformin Hcl 500 Mg Tabs (Metformin Hcl) .... One Tablet By Mouth Two Times A Day 11)  Glimepiride 2 Mg Tabs (Glimepiride) .... One Tablet By Mouth Once Daily 12)  Cozaar 100 Mg Tabs (Losartan Potassium) .... One Tablet Two Times A Day  Allergies (verified): 1)  ! Ace Inhibitors  Vital Signs:  Patient profile:   76 year old female Height:      64 inches Weight:      187 pounds BMI:     32.21 Pulse rate:   68 / minute Pulse rhythm:   regular BP sitting:   152 / 94  (left arm) Cuff size:   regular  Vitals Entered By: Doug Sou CMA (November 16, 2008 11:17 AM)  Physical Exam  General:  The patient was alert and oriented in no acute distress.Neck veins were flat,  carotids were brisk. Lungs were clear. Heart sounds were regular without murmurs or gallops. Abdomen was soft with active bowel sounds. There is no clubbing cyanosis or edema.    Impression & Recommendations:  Problem # 1:  ATRIAL FIBRILLATION, PAROXYSMAL (ICD-427.31) holding sinus rhythm on Rythmol.We will continue her on her current medications. We we do note that she is taking double beta blockers. However as these are going very well we will not change this. Her updated medication list for this problem includes:    Rythmol Sr 325 Mg Cp12 (Propafenone hcl) .Marland Kitchen... Take 1 capsule by mouth every twelve hours    Coumadin 2.5 Mg Tabs (Warfarin sodium) .Marland Kitchen... As directed    Lopressor 50 Mg Tabs (Metoprolol tartrate) .Marland Kitchen... 1/2 at am and 1/2  at pm    Aspirin 81 Mg Tbec (Aspirin) ..... Once daily  Problem # 2:  AV BLOCK, 1ST DEGREE (ICD-426.11) currently normal Her updated medication list for this problem includes:    Rythmol Sr 325 Mg Cp12 (Propafenone hcl) .Marland Kitchen... Take 1 capsule by mouth every twelve hours    Coumadin 2.5 Mg Tabs (Warfarin sodium) .Marland Kitchen... As directed    Lopressor 50 Mg Tabs (Metoprolol tartrate) .Marland Kitchen... 1/2 at am and 1/2  at pm    Aspirin 81 Mg Tbec (Aspirin) ..... Once daily  Orders: EKG w/  Interpretation (93000)  Problem # 3:  HYPERTENSION (ICD-401.9) Her blood pressure is elevated today. Mostly at home she says though that is about 135. I've asked to continue to follow this information and bring with her when she goes to see Dr. Rocky Link. Her updated medication list for this problem includes:    Lopressor 50 Mg Tabs (Metoprolol tartrate) .Marland Kitchen... 1/2 at am and 1/2  at pm    Furosemide 40 Mg Tabs (Furosemide) ..... Once daily    Aspirin 81 Mg Tbec (Aspirin) ..... Once daily    Cozaar 100 Mg Tabs (Losartan potassium) ..... One tablet two times a day  Appended Document: PER CHECK OUT/SF   Patient Instructions: 1)  Your physician recommends that you schedule a follow-up  appointment in: 12 months with Dr. Caryl Comes Prescriptions: COZAAR 100 MG TABS (LOSARTAN POTASSIUM) ONE TABLET two times a day  #60 x 11   Entered by:   Advertising account executive BSN   Authorized by:   Nikki Dom, MD, [facility]   Signed by:   Chanetta Marshall RN BSN on 11/16/2008   Method used:   Electronically to        [facility]. [phone]* (retail)       [address]       [place], [place]  [address]       Ph: [phone] or [phone]       Fax: [phone]   RxID:   [id number] FUROSEMIDE 40 MG  TABS (FUROSEMIDE) once daily  #30 x 11   Entered by:   Advertising account executive BSN   Authorized by:   Nikki Dom, MD, [facility]   Signed by:   Chanetta Marshall RN BSN on 11/16/2008   Method used:   Electronically to        [facility]. [phone]* (retail)       [address] [place], [place]  [address]       Ph: [phone] or [phone]       Fax: [phone]   RxID:   [id number] LOPRESSOR 50 MG  TABS (METOPROLOL TARTRATE) 1/2 at am and 1/2  at pm  #30 x 11   Entered by:   Advertising account executive BSN   Authorized by:   Nikki Dom, MD, [facility]   Signed by:   Chanetta Marshall RN BSN on 11/16/2008   Method used:   Electronically to        [facility]. [phone]* (retail)       [address]       [place], [place]  [address]       Ph: [phone] or [phone]       Fax: [phone]   RxID:   [id number] RYTHMOL SR 325 MG CP12 (PROPAFENONE HCL) Take 1 capsule by mouth every twelve hours  #60 x 11   Entered by:   Advertising account executive BSN   Authorized by:   Nikki Dom, MD, [facility]   Signed by:   Chanetta Marshall RN BSN on 11/16/2008   Method used:   Electronically to        [facility]. [phone]* (retail)       [address]  [place], [place]  [address]       Ph: [phone] or [phone]       Fax: [phone]   [id number] COUMADIN 2.5 MG  TABS (WARFARIN SODIUM) as directed  #30 x 11   Entered by:   Advertising account executive BSN   Authorized by:   Nikki Dom, MD, [facility]   Signed by:   Chanetta Marshall RN BSN on 11/16/2008   Method used:   Electronically to        [facility]. [phone]* (retail)       [address]       [place], [place]  [address]       Ph: [phone] or [phone]       Fax: [phone]   RxID:   [id number]

## 2010-06-13 NOTE — Progress Notes (Signed)
Summary: ?ov yearly or two yrs  Medications Added RYTHMOL SR 325 MG CP12 (PROPAFENONE HCL) Take 1 capsule by mouth every twelve hours       Phone Note Call from Patient Call back at Home Phone [phone]   Call For: Sharlett Iles Summary of Call: Pt needs to know when to schedule appt w/Dr Sharlett Iles, yearly or every two years?   Patient's chart has been requested.  Initial call taken by: Denice Paradise,  Sep 28, 2007 10:18 AM  Follow-up for Phone Call        ov june 18 and labs ordered prior, [facility]. Follow-up by: Bernita Buffy CMA,  Sep 28, 2007 11:09 AM    New/Updated Medications: RYTHMOL SR 325 MG CP12 (PROPAFENONE HCL) Take 1 capsule by mouth every twelve hours

## 2010-06-13 NOTE — Medication Information (Signed)
Summary: Sara Hancock  Anticoagulant Therapy  Managed by: Alinda Deem, PharmD, BCPS, CPP Referring MD: Virl Axe MD PCP: Gerrit Heck, MD Supervising MD: Lia Foyer MD, Marcello Moores Indication 1: Atrial Fibrillation (ICD-427.31) Lab Used: [facility] Site: [facility] INR POC 2.0 INR RANGE 2 - 3  Dietary changes: no    Health status changes: no    Bleeding/hemorrhagic complications: no    Recent/future hospitalizations: no    Any changes in medication regimen? no    Recent/future dental: no  Any missed doses?: no       Is patient compliant with meds? yes       Allergies: 1)  ! Ace Inhibitors  Anticoagulation Management History:      The patient is taking warfarin and comes in today for a routine follow up visit.  Positive risk factors for bleeding include an age of 74 years or older and presence of serious comorbidities.  The bleeding index is 'intermediate risk'.  Positive CHADS2 values include History of HTN and History of Diabetes.  Negative CHADS2 values include Age > 90 years old.  The start date was 01/13/2006.  Anticoagulation responsible provider: Lia Foyer MD, Marcello Moores.  INR POC: 2.0.  Cuvette Lot#: [id number].  Exp: 06/2010.    Anticoagulation Management Assessment/Plan:      The patient's current anticoagulation dose is Coumadin 5 mg tabs: Take as directed by coumadin clinic..  The target INR is 2.0-3.0.  The next INR is due 06/21/2009.  Anticoagulation instructions were given to patient.  Results were reviewed/authorized by Alinda Deem, PharmD, BCPS, CPP.  She was notified by Merlyn Albert, Pharm D Candidate.         Prior Anticoagulation Instructions: INR 1.5 Today take 5mg s then change dose to 2.5mg s everyday except 5mg s on Tuesdays, Thursdays, and Saturdays. Recheck in 2 weeks.   Current Anticoagulation Instructions: INR: 2.0 Continue with same dosage of 2.5mg  tablet daily except 5mg  on Tuesdays, Thursdays and Saturdays Recheck in 3 weeks

## 2010-06-13 NOTE — Medication Information (Signed)
Summary: rov/ewj  Anticoagulant Therapy  Managed by: Margaretha Sheffield, PharmD Referring MD: Virl Axe MD PCP: Gerrit Heck, MD Supervising MD: Verl Blalock MD, Marcello Moores Indication 1: Atrial Fibrillation (ICD-427.31) Lab Used: [facility] Site: [facility] INR POC 2.3 INR RANGE 2 - 3  Dietary changes: no    Health status changes: no    Bleeding/hemorrhagic complications: no    Recent/future hospitalizations: no    Any changes in medication regimen? no    Recent/future dental: no  Any missed doses?: no       Is patient compliant with meds? yes       Current Medications (verified): 1)  Rythmol Sr 325 Mg Cp12 (Propafenone Hcl) .... Take 1 Capsule By Mouth Every Twelve Hours 2)  Coumadin 5 Mg Tabs (Warfarin Sodium) .... Take As Directed By Coumadin Clinic. 3)  Lopressor 50 Mg  Tabs (Metoprolol Tartrate) .... 1/2 At Am and 1/2  At Pm 4)  Furosemide 40 Mg  Tabs (Furosemide) .... Once Daily 5)  Simvastatin 40 Mg  Tabs (Simvastatin) .... Once At Night 6)  Fexofenadine Hcl 180 Mg  Tabs (Fexofenadine Hcl) .... Once Daily 7)  Clidinium-Chlordiazepoxide 2.5-5 Mg  Caps (Clidinium-Chlordiazepoxide) .... As Needed 8)  Aspirin 81 Mg  Tbec (Aspirin) .... Once Daily 9)  Ursodiol 300 Mg  Caps (Ursodiol) .... One By Mouth Two Times A Day 10)  Metformin Hcl 500 Mg Tabs (Metformin Hcl) .... Take 1 Tablet in The Am and 2 Tablets in The Pm. 11)  Glimepiride 4 Mg Tabs (Glimepiride) .... Take 1 Tablet By Mouth Once A Day 12)  Cozaar 100 Mg Tabs (Losartan Potassium) .... One Tablet Two Times A Day  Allergies (verified): 1)  ! Ace Inhibitors  Anticoagulation Management History:      The patient is taking warfarin and comes in today for a routine follow up visit.  Positive risk factors for bleeding include an age of 39 years or older and presence of serious comorbidities.  The bleeding index is 'intermediate risk'.  Positive CHADS2 values include History of HTN and History of Diabetes.  Negative  CHADS2 values include Age > 69 years old.  The start date was 01/13/2006.  Anticoagulation responsible provider: Verl Blalock MD, Marcello Moores.  INR POC: 2.3.  Cuvette Lot#: [id number].  Exp: 09/2010.    Anticoagulation Management Assessment/Plan:      The patient's current anticoagulation dose is Coumadin 5 mg tabs: Take as directed by coumadin clinic..  The target INR is 2.0-3.0.  The next INR is due 08/16/2009.  Anticoagulation instructions were given to patient.  Results were reviewed/authorized by Margaretha Sheffield, PharmD.  She was notified by Margaretha Sheffield.         Prior Anticoagulation Instructions: INR 2.2  Continue on same dosage 1/2 tablet daily except 1 tablet on Tuesdays, Thursdays, and Saturdays.  Recheck in 4 weeks.    Current Anticoagulation Instructions: INR 2.3   Continue current dosing schedule.  Take 1 tablet on Teusday, Thursday, and Saturday, and take 1/2 tablet all other days.  Return to clinic in 4 weeks.

## 2010-06-13 NOTE — Medication Information (Signed)
Summary: rov/tm  Anticoagulant Therapy  Managed by: Freddrick March, RN, BSN Referring MD: Virl Axe MD PCP: Gerrit Heck, MD Supervising MD: Ron Parker MD, Dellis Filbert Indication 1: Atrial Fibrillation (ICD-427.31) Lab Used: [facility] Site: [facility] INR POC 2.3 INR RANGE 2 - 3  Dietary changes: no    Health status changes: yes       Details: Had part of toenail removed, started on abx yesterday x 7 days.  Unsure of name WCB.  Bleeding/hemorrhagic complications: no    Recent/future hospitalizations: no    Any changes in medication regimen? yes       Details: On iron supplement x 3 months. On abx x 7 days started yesterday WCB with name  Recent/future dental: no  Any missed doses?: no       Is patient compliant with meds? yes       Allergies: 1)  ! Ace Inhibitors 2)  ! Neosporin  Anticoagulation Management History:      The patient is taking warfarin and comes in today for a routine follow up visit.  Positive risk factors for bleeding include an age of 75 years or older and presence of serious comorbidities.  The bleeding index is 'intermediate risk'.  Positive CHADS2 values include History of HTN and History of Diabetes.  Negative CHADS2 values include Age > 59 years old.  The start date was 01/13/2006.  Anticoagulation responsible provider: Ron Parker MD, Dellis Filbert.  INR POC: 2.3.  Cuvette Lot#: [id number].  Exp: 02/2011.    Anticoagulation Management Assessment/Plan:      The patient's current anticoagulation dose is Coumadin 5 mg tabs: Take as directed by coumadin clinic..  The target INR is 2.0-3.0.  The next INR is due 12/20/2009.  Anticoagulation instructions were given to patient.  Results were reviewed/authorized by Freddrick March, RN, BSN.  She was notified by Freddrick March RN.         Prior Anticoagulation Instructions: INR 1.6 Today take 7.5mg s then change dose to 5mg s everyday except on Monday, Wednesdays and Fridays take 2.5mg s. Recheck in 2 weeks.   Current  Anticoagulation Instructions: INR 2.3  Continue on same dosage 1 tablet daily except 1/2 tablet on Mondays, Wednesdays, and Fridays.  Recheck in 3 weeks.  Call [phone] and give [place] the name of the abx you are currently on.

## 2010-06-13 NOTE — Medication Information (Signed)
Summary: Sara Hancock   Anticoagulant Therapy  Managed by: Porfirio Oar, PharmD Referring MD: Virl Axe MD PCP: Gerrit Heck, MD Supervising MD: Ron Parker MD, Dellis Filbert Indication 1: Atrial Fibrillation (ICD-427.31) Lab Used: [facility] Site: [facility] INR POC 2.1 INR RANGE 2 - 3  Dietary changes: no    Health status changes: no    Bleeding/hemorrhagic complications: no    Recent/future hospitalizations: no    Any changes in medication regimen? no    Recent/future dental: no  Any missed doses?: no       Is patient compliant with meds? yes       Allergies: 1)  ! Ace Inhibitors 2)  ! Neosporin  Anticoagulation Management History:      The patient is taking warfarin and comes in today for a routine follow up visit.  Positive risk factors for bleeding include an age of 39 years or older and presence of serious comorbidities.  The bleeding index is 'intermediate risk'.  Positive CHADS2 values include History of HTN and History of Diabetes.  Negative CHADS2 values include Age > 27 years old.  The start date was 01/13/2006.  Anticoagulation responsible provider: Ron Parker MD, Dellis Filbert.  INR POC: 2.1.  Cuvette Lot#: [id number].  Exp: 04/2011.    Anticoagulation Management Assessment/Plan:      The patient's current anticoagulation dose is Coumadin 5 mg tabs: Take as directed by coumadin clinic..  The target INR is 2.0-3.0.  The next INR is due 05/02/2010.  Anticoagulation instructions were given to patient.  Results were reviewed/authorized by Porfirio Oar, PharmD.  She was notified by Porfirio Oar PharmD.         Prior Anticoagulation Instructions: INR 1.8 Today take 7.5mg s then resume 5mg s everyday except 2.5mg s on Mondays, Wednesdays and Fridays. Recheck in 3 weeks.   Current Anticoagulation Instructions: INR 2.1  Continue same dose of 1 tablet every day except 1/2 tablet on Monday, Wednesday and Friday.  Recheck INR in 4 weeks.

## 2010-06-13 NOTE — Medication Information (Signed)
Summary: rov.mp  Anticoagulant Therapy  Managed by: Alinda Deem PharmD, BCPS, CPP Referring MD: Virl Axe MD PCP: Gerrit Heck, MD Supervising MD: Johnsie Cancel MD, Collier Salina Indication 1: Atrial Fibrillation (ICD-427.31) Lab Used: [facility] Site: [facility] PT 19.3 INR POC 2.5 INR RANGE 2 - 3  Dietary changes: no    Health status changes: no    Bleeding/hemorrhagic complications: no    Recent/future hospitalizations: no    Any changes in medication regimen? yes       Details: see med list  Recent/future dental: no  Any missed doses?: no       Is patient compliant with meds? yes       Current Medications (verified): 1)  Rythmol Sr 325 Mg Cp12 (Propafenone Hcl) .... Take 1 Capsule By Mouth Every Twelve Hours 2)  Coumadin 5 Mg Tabs (Warfarin Sodium) .... Take As Directed By Coumadin Clinic. 3)  Lopressor 50 Mg  Tabs (Metoprolol Tartrate) .... 1/2 At Am and 1/2  At Pm 4)  Furosemide 40 Mg  Tabs (Furosemide) .... Once Daily 5)  Simvastatin 40 Mg  Tabs (Simvastatin) .... Once At Night 6)  Fexofenadine Hcl 180 Mg  Tabs (Fexofenadine Hcl) .... Once Daily 7)  Clidinium-Chlordiazepoxide 2.5-5 Mg  Caps (Clidinium-Chlordiazepoxide) .... As Needed 8)  Aspirin 81 Mg  Tbec (Aspirin) .... Once Daily 9)  Ursodiol 300 Mg  Caps (Ursodiol) .... One By Mouth Two Times A Day 10)  Metformin Hcl 500 Mg Tabs (Metformin Hcl) .... Take 1 Tablet in The Am and 2 Tablets in The Pm. 11)  Glimepiride 4 Mg Tabs (Glimepiride) .... Take 1 Tablet By Mouth Once A Day 12)  Cozaar 100 Mg Tabs (Losartan Potassium) .... One Tablet Two Times A Day  Allergies: 1)  ! Ace Inhibitors  Anticoagulation Management History:      The patient is taking warfarin and comes in today for a routine follow up visit.  Positive risk factors for bleeding include an age of 26 years or older and presence of serious comorbidities.  The bleeding index is 'intermediate risk'.  Positive CHADS2 values include History of HTN and  History of Diabetes.  Negative CHADS2 values include Age > 33 years old.  The start date was 01/13/2006.  Prothrombin time is 19.3.  Anticoagulation responsible provider: Johnsie Cancel MD, Collier Salina.  INR POC: 2.5.  Cuvette Lot#: [id number].  Exp: 11/2009.    Anticoagulation Management Assessment/Plan:      The patient's current anticoagulation dose is Coumadin 5 mg tabs: Take as directed by coumadin clinic..  The target INR is 2.0-3.0.  The next INR is due 12/28/2008.  Anticoagulation instructions were given to patient.  Results were reviewed/authorized by Alinda Deem PharmD, BCPS, CPP.  She was notified by Freddrick March RN.         Prior Anticoagulation Instructions: INR 2.8  The patient is to continue with the same dose of coumadin.  This dosage includes:  1/2 tablet (2.5 mg) every day, except 1 tablet (5 mg) on Tuesday, Thursday, and Saturday  Current Anticoagulation Instructions: INR 2.5  Continue same dosage 1/2 tablet daily except 1 tablet on Tuesdays, Thursdays and Saturdays.  Recheck in 4 weeks.

## 2010-06-13 NOTE — Medication Information (Signed)
Summary: rov   js  Anticoagulant Therapy  Managed by: Tula Nakayama, RN, BSN Referring MD: Virl Axe MD PCP: Gerrit Heck, MD Supervising MD: Lovena Le MD, Carleene Overlie Indication 1: Atrial Fibrillation (ICD-427.31) Lab Used: [facility] Site: [facility] INR POC 1.6 INR RANGE 2 - 3  Dietary changes: no    Health status changes: no    Bleeding/hemorrhagic complications: no    Recent/future hospitalizations: no    Any changes in medication regimen? no    Recent/future dental: no  Any missed doses?: no       Is patient compliant with meds? yes       Allergies: 1)  ! Ace Inhibitors  Anticoagulation Management History:      The patient is taking warfarin and comes in today for a routine follow up visit.  Positive risk factors for bleeding include an age of 74 years or older and presence of serious comorbidities.  The bleeding index is 'intermediate risk'.  Positive CHADS2 values include History of HTN and History of Diabetes.  Negative CHADS2 values include Age > 74 years old.  The start date was 01/13/2006.  Anticoagulation responsible provider: Lovena Le MD, Carleene Overlie.  INR POC: 1.6.  Cuvette Lot#: [id number].  Exp: 01/2011.    Anticoagulation Management Assessment/Plan:      The patient's current anticoagulation dose is Coumadin 5 mg tabs: Take as directed by coumadin clinic..  The target INR is 2.0-3.0.  The next INR is due 11/29/2009.  Anticoagulation instructions were given to patient.  Results were reviewed/authorized by Tula Nakayama, RN, BSN.  She was notified by Tula Nakayama, RN, BSN.         Prior Anticoagulation Instructions: The patient is to continue with the same dose of coumadin.  This dosage includes:  Take an extra half of a tablet today then resume half of a tablet every day except a whole tablet on Tuesdays, Thursdays & Saturdays.  Current Anticoagulation Instructions: INR 1.6 Today take 7.5mg s then change dose to 5mg s everyday except on Monday, Wednesdays and Fridays  take 2.5mg s. Recheck in 2 weeks.

## 2010-06-25 ENCOUNTER — Encounter (INDEPENDENT_AMBULATORY_CARE_PROVIDER_SITE_OTHER): Payer: Medicare Other

## 2010-06-25 ENCOUNTER — Encounter: Payer: Self-pay | Admitting: Cardiovascular Disease

## 2010-06-25 DIAGNOSIS — I4891 Unspecified atrial fibrillation: Secondary | ICD-10-CM

## 2010-06-25 DIAGNOSIS — Z7901 Long term (current) use of anticoagulants: Secondary | ICD-10-CM

## 2010-06-25 LAB — CONVERTED CEMR LAB: POC INR: 2.7

## 2010-07-03 NOTE — Medication Information (Signed)
Summary: [facility]   Anticoagulant Therapy  Managed by: Danella Penton, RN Referring MD: Virl Axe MD PCP: Gerrit Heck, MD Supervising MD: Johnsie Cancel MD, Collier Salina Indication 1: Atrial Fibrillation (ICD-427.31) Lab Used: [facility] Site: [facility] INR POC 2.7 INR RANGE 2 - 3  Dietary changes: no    Health status changes: no    Bleeding/hemorrhagic complications: no    Recent/future hospitalizations: no    Any changes in medication regimen? no    Recent/future dental: no  Any missed doses?: no       Is patient compliant with meds? yes       Allergies: 1)  ! Ace Inhibitors 2)  ! Neosporin  Anticoagulation Management History:      The patient is taking warfarin and comes in today for a routine follow up visit.  Positive risk factors for bleeding include an age of 74 years or older and presence of serious comorbidities.  The bleeding index is 'intermediate risk'.  Positive CHADS2 values include History of HTN and History of Diabetes.  Negative CHADS2 values include Age > 74 years old.  The start date was 01/13/2006.  Anticoagulation responsible provider: Johnsie Cancel MD, Collier Salina.  INR POC: 2.7.  Exp: 05/2011.    Anticoagulation Management Assessment/Plan:      The patient's current anticoagulation dose is Coumadin 5 mg tabs: Take as directed by coumadin clinic..  The target INR is 2.0-3.0.  The next INR is due 07/25/2010.  Anticoagulation instructions were given to patient.  Results were reviewed/authorized by Danella Penton, RN.  She was notified by Danella Penton, RN.         Prior Anticoagulation Instructions: INR 2.0  Coumadin 5 mg tablets - Continue 1 whole tablet every day except 1/2 tablet oN mondays, Wednesdays and Fridays   Current Anticoagulation Instructions: INR 2.7 Continue taking 1 tablet everyday, except take 1/2 tablet on Mondays, Wednesdays, and Fridays. Recheck in 4 weeks.

## 2010-07-08 ENCOUNTER — Encounter: Payer: Self-pay | Admitting: Internal Medicine

## 2010-07-08 DIAGNOSIS — I4891 Unspecified atrial fibrillation: Secondary | ICD-10-CM

## 2010-07-24 ENCOUNTER — Encounter: Payer: Self-pay | Admitting: Cardiology

## 2010-07-24 ENCOUNTER — Encounter (INDEPENDENT_AMBULATORY_CARE_PROVIDER_SITE_OTHER): Payer: Medicare Other

## 2010-07-24 DIAGNOSIS — I4891 Unspecified atrial fibrillation: Secondary | ICD-10-CM

## 2010-07-24 DIAGNOSIS — Z7901 Long term (current) use of anticoagulants: Secondary | ICD-10-CM

## 2010-07-24 LAB — CONVERTED CEMR LAB: POC INR: 2.7

## 2010-07-30 NOTE — Medication Information (Signed)
Summary: rov/pc   Anticoagulant Therapy  Managed by: Danella Penton, RN Referring MD: Virl Axe MD PCP: Gerrit Heck, MD Supervising MD: Lia Foyer MD, Marcello Moores Indication 1: Atrial Fibrillation (ICD-427.31) Lab Used: [facility] Site: [facility] INR POC 2.7 INR RANGE 2 - 3  Dietary changes: no    Health status changes: no    Bleeding/hemorrhagic complications: no    Recent/future hospitalizations: no    Any changes in medication regimen? no    Recent/future dental: no  Any missed doses?: no       Is patient compliant with meds? yes       Allergies: 1)  ! Ace Inhibitors 2)  ! Neosporin  Anticoagulation Management History:      The patient is taking warfarin and comes in today for a routine follow up visit.  Positive risk factors for bleeding include an age of 47 years or older and presence of serious comorbidities.  The bleeding index is 'intermediate risk'.  Positive CHADS2 values include History of HTN and History of Diabetes.  Negative CHADS2 values include Age > 51 years old.  The start date was 01/13/2006.  Anticoagulation responsible provider: Lia Foyer MD, Marcello Moores.  INR POC: 2.7.  Cuvette Lot#: [id number].  Exp: 06/2011.    Anticoagulation Management Assessment/Plan:      The patient's current anticoagulation dose is Coumadin 5 mg tabs: Take as directed by coumadin clinic..  The target INR is 2.0-3.0.  The next INR is due 08/21/2010.  Anticoagulation instructions were given to patient.  Results were reviewed/authorized by Danella Penton, RN.  She was notified by Danella Penton, RN.         Prior Anticoagulation Instructions: INR 2.7 Continue taking 1 tablet everyday, except take 1/2 tablet on Mondays, Wednesdays, and Fridays. Recheck in 4 weeks.  Current Anticoagulation Instructions: INR 2.7 Continue taking 1 tablet every day, except take 1/2 tablet on Mondays, Wednesdays, and Fridays. Recheck in 4 weeks.

## 2010-08-14 ENCOUNTER — Other Ambulatory Visit: Payer: Self-pay | Admitting: Internal Medicine

## 2010-08-21 ENCOUNTER — Ambulatory Visit (INDEPENDENT_AMBULATORY_CARE_PROVIDER_SITE_OTHER): Payer: Medicare Other | Admitting: *Deleted

## 2010-08-21 DIAGNOSIS — Z7901 Long term (current) use of anticoagulants: Secondary | ICD-10-CM

## 2010-08-21 DIAGNOSIS — I4891 Unspecified atrial fibrillation: Secondary | ICD-10-CM

## 2010-08-21 LAB — POCT INR: INR: 2.7

## 2010-09-18 ENCOUNTER — Ambulatory Visit (INDEPENDENT_AMBULATORY_CARE_PROVIDER_SITE_OTHER): Payer: Medicare Other | Admitting: *Deleted

## 2010-09-18 DIAGNOSIS — I4891 Unspecified atrial fibrillation: Secondary | ICD-10-CM

## 2010-09-18 LAB — POCT INR: INR: 2.3

## 2010-09-19 ENCOUNTER — Encounter: Payer: Medicare Other | Admitting: *Deleted

## 2010-09-20 ENCOUNTER — Other Ambulatory Visit (HOSPITAL_COMMUNITY): Payer: Self-pay | Admitting: Family Medicine

## 2010-09-20 DIAGNOSIS — Z1231 Encounter for screening mammogram for malignant neoplasm of breast: Secondary | ICD-10-CM

## 2010-09-24 NOTE — Assessment & Plan Note (Signed)
[place]                         ELECTROPHYSIOLOGY OFFICE NOTE   NAME:Eischen, ODIE SANTODOMINGO                   MRN:          [id number]  DATE:11/17/2007                            DOB:          25-Mar-1935    Mrs. Brigandi is seen in followup for atrial fibrillation in the context  of hypertension and diabetes.  She is having no significant palpitations  at this point.  She is very pleased with how she is doing.  Her blood  pressure is well controlled.  There has been no problems with shortness  of breath or chest pain.   PHYSICAL EXAMINATION:  VITAL SIGNS:  Her weight is down 4 pounds  (hurray), her blood pressure was also down at 128/62, her pulse was 58.  LUNGS:  Clear.  NECK:  Veins were flat.  HEART:  Sounds were regular without murmurs.  EXTREMITIES:  No edema.   Electrocardiogram dated today demonstrated sinus rhythm at 50 with  interval of 0.22/0.10/0.41.  Axis was 44 degrees.   IMPRESSION:  1. Paroxysmal atrial fibrillation.  2. Rythmol therapy for paroxysmal atrial fibrillation.  3. First-degree atrioventricular block potentially related to Rythmol      therapy for paroxysmal atrial fibrillation.  4. Thromboembolic risk factors notable for:      a.     Diabetes.      b.     Hypertension.  5. Coumadin therapy.   Mrs. Munns is doing really quite well.  She asked about generic  versus proprietary propafenone.  I gave her some of the information that  we have from the old Johnsie Cancel study in terms of increased risk of  arrhythmia as well as the definition of bioequivalence.  She has chosen  to stay on the proprietary brand and we have refilled a prescription at  325 q.12.   We will need to keep an eye on her first-degree AV block.  Her blood  pressure is doing well.  We will see her again in one year's time.  She  will follow up with Dr. Rocky Link in the interim.     [name], MD, [facility]  Electronically Signed    SCK/MedQ   DD: 11/17/2007  DT: 11/18/2007  Job #: [id number]   cc:   Mardene Celeste A. Rocky Link, M.D.

## 2010-09-24 NOTE — Letter (Signed)
November 04, 2006    Mardene Celeste A. Rocky Link, M.D.  [address]  [place], [place] [address]   RE:  BEVERLI, SUNDBERG  MRN:  [id number]  /  DOB:  April 13, 1935   Dear [name]:   I hope this letter finds you well.  Ms. Munion comes in today.  She is  actually doing really very well on her atrial fibrillation.  She is  taking the Rythmol.  She has some persistent problems with bradycardia  but this has not been too bad.  Her heart rate runs about 55 with the  lower dose of metoprolol.   Reviewing her medications, though, I note that she is not on an ARB.  She is intolerant to ACE inhibitors, as you know.   PHYSICAL EXAMINATION:  VITAL SIGNS:  She on examination has a blood  pressure today that is elevated at 142/82, and she says her blood  pressures at home are about 137.  Her pulse was 54.  LUNGS:  Clear.  CARDIAC:  Heart sounds were regular.  EXTREMITIES:  Without edema.   Electrocardiogram dated today demonstrates sinus rhythm at 54 with  intervals of 0.19/0.09/0.44.   IMPRESSION:  1. Paroxysmal atrial fibrillation.  2. Rythmol therapy for #1.  3. Diabetes.  4. Hypertension.  5. Coumadin therapy.   Patty, Ms. Austill is doing pretty well.  She wants to continue on her  proprietary Rythmol as it is quite effective.  As we were talking about  the addition of an ARB for renal protection, blood pressure control as  well as the data that it decreases frequency of atrial fibrillation, she  mentioned that the cost is for her quite limiting.  To that end, I am  going to go ahead and give her a prescription today for  hydrochlorothiazide at 12.5 mg a day to see if we cannot get her blood  pressure down, but I asked her to follow up with you to see if there is  anything else that could be done to try to rectify her pharmacy cost  burden, specifically, are there alternatives to her Avandia and when an  ARB becomes available generically, potentially we could use that as  well.     Sincerely,      [name], MD, [facility]  Electronically Signed    SCK/MedQ  DD: 11/04/2006  DT: 11/04/2006  Job #: [id number]

## 2010-09-27 NOTE — Procedures (Signed)
[facility]                                EXERCISE TREADMILL   NAME:Sara Hancock, Sara Hancock                   MRN:          [id number]  DATE:01/14/2006                            DOB:          04/20/1935    HISTORY:  Sara Hancock is a 77 year old female patient seen in consultation  by Dr. Caryl Comes on August 22nd with a history of paroxysmal atrial  fibrillation.  She has been fairly symptomatic on this, and he elected to  start her on Rythmol therapy.  She returns today for exercise treadmill test  to rule out exercise-induced ventricular arrhythmias.   EXERCISE TREADMILL TEST:  The patient exercised according to Bruce protocol  for 2 minutes, 17 seconds, and achieved a work level of 4.8 mets.  Her heart  rate rose from 83 beats per minute to maximal heart rate of 106 beats per  minute.  This represented about 70% of her maximal age-predicted heart rate.  Her blood pressure rose from 134/80 to 180/60.  The exercise test was  stopped secondary to fatigue and shortness of breath.  She did complain of  minimal chest discomfort at the end of the test that seemed to be related to  the deep breathing.   Electrocardiogram at baseline showed normal sinus rhythm with a heart rate  of 53.  During exercise, she had no ventricular arrhythmias.  There were no  ST-T wave changes to suggest ischemia or injury.   INTERPRETATION:  Suboptimal exercise treadmill test.  Poor exercise  tolerance.  No exercise-induced ventricular arrhythmias.   DISPOSITION:  The exercise treadmill test would be left for further review  by Dr. Caryl Comes.  If there are any further recommendations, the patient will be  contacted at home.                                   Richardson Dopp, PA-C                                Signa Kell, MD, [facility]   SW/MedQ  DD:  01/14/2006  DT:  01/14/2006  Job #:  [id number]   cc:   Mardene Celeste A. Rocky Link, M.D.

## 2010-09-27 NOTE — Assessment & Plan Note (Signed)
[facility] OFFICE NOTE   NAME:Sara Hancock, Sara Hancock                   MRN:          [id number]  DATE:02/03/2006                            DOB:          12-15-1934    Sara Hancock has new onset atrial fibrillation and has been placed on Rythmol  because of her propensity for bradycardia. She also apparently is being  begun on anticoagulation therapy.   In terms of her low grade primary biliary cirrhosis, she is having no  problems whatsoever taking Urso 300 mg twice a day. She denies abdominal  pain, nausea and vomiting, pruritus, chronic fatigue, etc. Liver function  tests were checked on September 17 were entirely normal with an albumin of  3.2 grams percent. Hemoglobin is 11.9 with a white count of 4100, platelet  count of 190,000.   PHYSICAL EXAMINATION:  Exam today shows a weight of 189 pounds and blood  pressure of 120/68. Pulse was 76 and regular. I could not appreciate  hepatosplenomegaly, abdominal masses or tenderness or stigmata of chronic  liver disease.   ASSESSMENT:  Sara Hancock has very mild primary biliary cirrhosis without  evidence of progressive over several years.   RECOMMENDATIONS:  Will continue Urso twice a day with liver function tests  and GI evaluations on a yearly basis unless otherwise indicated.                                   Loralee Pacas. Sharlett Iles, MD, [facility], [place]   DRP/MedQ  DD:  02/03/2006  DT:  02/05/2006  Job #:  [id number]

## 2010-09-27 NOTE — Assessment & Plan Note (Signed)
[facility]                         ELECTROPHYSIOLOGY OFFICE NOTE   NAME:Ihde, SAQUANA MARUSKA                   MRN:          [id number]  DATE:04/24/2006                            DOB:          07-17-1934    Ms. Haynes is seen today in follow up for her paroxysmal atrial  fibrillation, she is on Rythmol and is doing really very well.   We have reviewed her medications, there is some duplication as the  Rythmol was added to her previous dose of metoprolol.  Will plan to  decrease her metoprolol dose today to 25 mg twice daily.  Her other  medications include the Avandia, recent introduction of furosemide and  warfarin and simvastatin.   On examination today her blood pressure was better at 124/84, her pulse  though was low at 54.  LUNGS:  Clear.  HEART SOUNDS:  Regular.   Electrocardiogram demonstrated sinus rhythm at a rate of 54 with  intervals of 0.22/0.1/0.43.   IMPRESSION:  1. Paroxysmal atrial fibrillation.  2. Relative bradycardia.  3. Diabetes.  4. Fluid retention, recently, she is on a diuretic.  5. Bradycardia.   I have decreased Ms. Schwan's metoprolol as noted above.  When I see  her next, around 6 months' time, will plan to discontinue her metoprolol  all together if things are going well.   This may also alleviate her bradycardia to some degree.     [name], MD, [facility]  Electronically Signed    SCK/MedQ  DD: 04/24/2006  DT: 04/24/2006  Job #: [id number]   cc:   Winifred Olive. Rocky Link, M.D.

## 2010-09-27 NOTE — Letter (Signed)
December 31, 2005     Mardene Celeste A. Rocky Link, MD  [address]  [place], [place] [address]   RE:  VINNIA, JESSON  MRN:  [id number]  /  DOB:  1934-06-24   Dear [name];   It was a long pleasure to see Sara Hancock today in consultation with  her husband.   As you know, she is a 76 year old with hypertension and diabetes, who  presented to hospital on June 30 with tachy palpitations that had awakened  her from sleep.  They were accompanied by a quivering feeling.  She was  admitted to the hospital, and a diagnosis of atrial flutter is what was on  the chart.  She underwent a cardiac evaluation demonstrating a normal echo  apparently, and a nonischemic Cardiolite, the report of which I do have.   She has had recurrent episodes since then, typically lasting about 15 to 30  minutes.   Her thromboembolic risk factors are notable for hypertension and diabetes.   PAST MEDICAL HISTORY:  In addition to the above is notable for;  1. Biliary cirrhosis.  2. Gastroesophageal reflux disease.  3. Thyroid disease, with a partial thyroidectomy for a tumor.  4. Allergies.   PAST SURGICAL HISTORY:  Is notable as above.   MEDICATIONS:  1. Avandia 4 mg.  2. Metoprolol 25/50 mg.  3. Ursodiol.  4. Simvastatin 40 mg.   ALLERGIES:  ACE INHIBITORS with angioedema.   SOCIAL HISTORY:  She is married, she has 2 children.  She denies cigarettes,  alcohol or recreational drugs.  She is retired from being an Manufacturing systems engineer.   PHYSICAL EXAMINATION:  GENERAL:  She is an older Caucasian female.  Her  stated age is 76.  VITAL SIGNS:  Her weight was 185 pounds.  Her blood pressure was 124/75, and  her pulse was 56.  HEENT:  Demonstrated no icterus or xanthoma.  NECK:  The neck veins were flat.  The carotids were brisk and full  bilaterally without bruits.  BACK:  Without kyphosis or scoliosis.  LUNGS:  Clear.  CARDIAC:  Heart sounds were regular with a 2/6 systolic murmur.  P2 is  mildly  increased, and the S4 was present.  ABDOMEN:  Soft with active bowel sounds, without midline pulsation or  hepatomegaly.  EXTREMITIES:  Femoral pulses were 2+, distal pulses were intact, and there  is no clubbing, cyanosis or edema.  NEUROLOGIC:  Exam was grossly normal.  SKIN:  Warm and dry.   Electrocardiogram dated today demonstrated sinus rhythm at a 56 with  intervals of 0.18/0.09/0.42.  Electrocardiogram was otherwise normal.   Review of her event loop recorder demonstrated atrial fibrillation as well  as a narrow regular QRS tachycardia, presumed to be atrial flutter based on:  A.  The antecedent history of atrial flutter being diagnosed at hospital.  B.  The associated atrial fibrillation.   IMPRESSION:  1. Atrial fibrillation.  2. Narrow QRS tachycardia, probably atrial flutter.  3. Sinus bradycardia.  4. Thromboembolic risk factors notable for:      a.     Hypertension.      b.     Diabetes.  5. Normal left ventricular function and nonischemic Myoview.  6. Biliary cirrhosis.   Sara Hancock, Sara Hancock has atrial fibrillation and probably has atrial flutter.  These are modestly symptomatic, but are becoming increasingly frequent and  most increasingly problematic.   The first issue is that of anticoagulation.  She has a [place] score of 2  based  on her hypertension and diabetes.  The consensus recommendation would be for  her to undergo Coumadin anticoagulation, with aspirin being an inadequate  second choice.  I have reviewed this extensively with her and her husband,  including the potential pitfalls of the drug.  I have also introduced them  to the possibility of being enrolled in the ROCKET trial, looking at a 10-A  inhibitor as an alternative to Coumadin in a randomized, double blind,  placebo controlled trial.  We will have them talk with the research nurses  and they will review that with them later this week.   As relates to strategies for atrial fibrillation/flutter,  the issue is  complicated by her tachybrady.  She is on low-dose metoprolol, as you know.  Augmented doses of rate control are going to make it difficult to control  the rates, particularly if it is flutter, without getting into the problems  of bradycardia.  Therefore, I would recommend, especially given the  increasingly likely possibility that atrial ablation is something that would  work that will be more widely available down the road, and with a better  safety profile, to undertake an antiarrhythmic drug trial option now.  This  is true notwithstanding the data that has recently been published regarding  associated risks that were taken from the Medicare cohort.  What I would  suggest is that we take and use a 1C agent, this would hopefully eliminate  her atrial fibrillation or convert her residual atrial fibrillation, atrial  flutter.  And if the atrial flutter recurred, we could undertake catheter  ablation of her cavotricuspid isthmus for her atrial flutter.  In the event  that it was unsuccessful in eliminating the atrial fibrillation, alternative  drug therapy could be sought.   Thanks very much for asking [place] to see her.  We will look forward to talking  to her later this week, as she tries to finalize her decisions.   Again, thank you very much for asking [place] to see her.    Sincerely,      [name], MD, [facility]   SCK/MedQ  DD:  12/31/2005  DT:  01/01/2006  Job #:  [id number]

## 2010-09-27 NOTE — Letter (Signed)
January 09, 2006     Mardene Celeste A. Rocky Link, MD  [address]  [place], [place] [address]   RE:  Sara Hancock, Sara Hancock  MRN:  [id number]  /  DOB:  [birth year]   Dear [name],   I had a chance to meet with the [facility] family today.  They were quite  frustrated.  We had spent a long time last week trying to clarify options  with them.  The issue of anticoagulation versus _______ atrial fibrillation  has been really hard for them to associate into two separate issues.  We  talked about rates, rhythm control, and the issues about rate and  bradycardia, essentially pacing on the one and proarrhythmic effect on the  other.  We had asked them to come with their family members today, so their  daughter and son came to help assure that what we were communicating could  be absorbed.   At the end of about an hour and 20 minutes, we had come up with the  following plan:  1. She will be on antiarrhythmic therapy with a I-C agent, specifically      Rythmol, chosen because she already has some propensities to      bradycardia, and I do not want to add a second AV nodal blocking agent      which would be designed to prevent 1-to-1 flutter.  2. She will begin on anticoagulation.  She will be contacted by the      research nurses next week to see whether she is a candidate for either      the Rocket trials or Rely trials; if not, she will be begun on      Coumadin, and we ask that you follow that.  3. She will need a treadmill next week, to look for arrhythmia.      Unfortunately, everybody is gone from the office now, as it is 6:30,      and this will have to be scheduled next week.   Thanks very much for asking me to participate in her care.    Sincerely,      [name], MD, [facility]   SCK/MedQ  DD:  01/09/2006  DT:  01/11/2006  Job #:  [id number]

## 2010-10-17 ENCOUNTER — Ambulatory Visit (INDEPENDENT_AMBULATORY_CARE_PROVIDER_SITE_OTHER): Payer: Medicare Other | Admitting: *Deleted

## 2010-10-17 DIAGNOSIS — I4891 Unspecified atrial fibrillation: Secondary | ICD-10-CM

## 2010-10-17 LAB — POCT INR: INR: 2.1

## 2010-10-24 ENCOUNTER — Telehealth: Payer: Self-pay | Admitting: Gastroenterology

## 2010-10-24 DIAGNOSIS — K743 Primary biliary cirrhosis: Secondary | ICD-10-CM

## 2010-10-24 DIAGNOSIS — D649 Anemia, unspecified: Secondary | ICD-10-CM

## 2010-10-24 DIAGNOSIS — R6889 Other general symptoms and signs: Secondary | ICD-10-CM

## 2010-10-24 NOTE — Telephone Encounter (Signed)
Notified pt I will order labs for her- Sara Hancock and [name]. Asked pt if she will be able to get to this appt when she has an MRI Breast appt earlier and she stated she could do it.

## 2010-10-28 ENCOUNTER — Other Ambulatory Visit (INDEPENDENT_AMBULATORY_CARE_PROVIDER_SITE_OTHER): Payer: Medicare Other

## 2010-10-28 DIAGNOSIS — D649 Anemia, unspecified: Secondary | ICD-10-CM

## 2010-10-28 DIAGNOSIS — K745 Biliary cirrhosis, unspecified: Secondary | ICD-10-CM

## 2010-10-28 DIAGNOSIS — K743 Primary biliary cirrhosis: Secondary | ICD-10-CM

## 2010-10-28 DIAGNOSIS — R6889 Other general symptoms and signs: Secondary | ICD-10-CM

## 2010-10-28 LAB — BASIC METABOLIC PANEL
BUN: 22 mg/dL (ref 6–23)
CO2: 29 mEq/L (ref 19–32)
Calcium: 8.8 mg/dL (ref 8.4–10.5)
Chloride: 95 mEq/L — ABNORMAL LOW (ref 96–112)
Creatinine, Ser: 0.7 mg/dL (ref 0.4–1.2)
GFR: 81.56 mL/min (ref >60.00)
Glucose, Bld: 206 mg/dL — ABNORMAL HIGH (ref 70–99)
Potassium: 4 mEq/L (ref 3.5–5.1)
Sodium: 138 mEq/L (ref 135–145)

## 2010-10-28 LAB — IBC PANEL
Iron: 42 ug/dL (ref 42–145)
Saturation Ratios: 9.1 % — ABNORMAL LOW (ref 20.0–50.0)
Transferrin: 330.9 mg/dL (ref 212.0–360.0)

## 2010-10-28 LAB — CBC WITH DIFFERENTIAL/PLATELET
Basophils Absolute: 0 10*3/uL (ref 0.0–0.1)
Basophils Relative: 0.3 % (ref 0.0–3.0)
Eosinophils Absolute: 0 10*3/uL (ref 0.0–0.7)
Eosinophils Relative: 0.5 % (ref 0.0–5.0)
HCT: 34 % — ABNORMAL LOW (ref 36.0–46.0)
Hemoglobin: 11.5 g/dL — ABNORMAL LOW (ref 12.0–15.0)
Lymphocytes Relative: 30.9 % (ref 12.0–46.0)
Lymphs Abs: 1.9 10*3/uL (ref 0.7–4.0)
MCHC: 33.9 g/dL (ref 30.0–36.0)
MCV: 90.6 fl (ref 78.0–100.0)
Monocytes Absolute: 0.6 10*3/uL (ref 0.1–1.0)
Monocytes Relative: 9.1 % (ref 3.0–12.0)
Neutro Abs: 3.7 10*3/uL (ref 1.4–7.7)
Neutrophils Relative %: 59.2 % (ref 43.0–77.0)
Platelets: 294 10*3/uL (ref 150.0–400.0)
RBC: 3.76 Mil/uL — ABNORMAL LOW (ref 3.87–5.11)
RDW: 15.6 % — ABNORMAL HIGH (ref 11.5–14.6)
WBC: 6.2 10*3/uL (ref 4.5–10.5)

## 2010-10-28 LAB — HEPATIC FUNCTION PANEL
ALT: 33 U/L (ref 0–35)
AST: 32 U/L (ref 0–37)
Albumin: 3.8 g/dL (ref 3.5–5.2)
Alkaline Phosphatase: 173 U/L — ABNORMAL HIGH (ref 39–117)
Bilirubin, Direct: 0.1 mg/dL (ref 0.0–0.3)
Total Bilirubin: 0.7 mg/dL (ref 0.3–1.2)
Total Protein: 7.5 g/dL (ref 6.0–8.3)

## 2010-10-28 LAB — TSH: TSH: 1.56 u[IU]/mL (ref 0.35–5.50)

## 2010-10-28 LAB — FOLATE: Folate: >24.8 ng/mL (ref >5.9)

## 2010-10-28 LAB — VITAMIN B12: Vitamin B-12: 451 pg/mL (ref 211–911)

## 2010-10-28 LAB — FERRITIN: Ferritin: 10.6 ng/mL (ref 10.0–291.0)

## 2010-10-29 ENCOUNTER — Telehealth: Payer: Self-pay | Admitting: *Deleted

## 2010-10-29 DIAGNOSIS — R6889 Other general symptoms and signs: Secondary | ICD-10-CM

## 2010-10-29 DIAGNOSIS — D509 Iron deficiency anemia, unspecified: Secondary | ICD-10-CM

## 2010-10-29 MED ORDER — TANDEM PLUS 162-115.2-1 MG PO CAPS: 1.0000 | ORAL_CAPSULE | Freq: Every day | ORAL | Status: DC

## 2010-10-29 NOTE — Telephone Encounter (Signed)
Message copied by Sheral Flow on Tue Oct 29, 2010  2:20 PM ------      Message from: Sharlett Iles, DAVID R      Created: Mon Oct 28, 2010  5:25 PM       NEEDS DAILY TANDEM...REPEAT LABS 3 MOS.Marland KitchenMarland Kitchen

## 2010-10-30 NOTE — Telephone Encounter (Signed)
Pt aware and I will call her in 3 months to remind her to come for labs.

## 2010-11-07 ENCOUNTER — Ambulatory Visit (HOSPITAL_COMMUNITY): Payer: Medicare Other

## 2010-11-07 ENCOUNTER — Ambulatory Visit (HOSPITAL_COMMUNITY)
Admission: RE | Admit: 2010-11-07 | Discharge: 2010-11-07 | Disposition: A | Discharge status: Home / Self Care | Payer: Medicare Other | Source: Ambulatory Visit | Attending: Family Medicine | Admitting: Family Medicine

## 2010-11-07 ENCOUNTER — Encounter: Payer: Self-pay | Admitting: Gastroenterology

## 2010-11-07 ENCOUNTER — Ambulatory Visit (INDEPENDENT_AMBULATORY_CARE_PROVIDER_SITE_OTHER): Payer: Medicare Other | Admitting: Gastroenterology

## 2010-11-07 VITALS — BP 130/90 | HR 80 | Ht 64.0 in | Wt 188.0 lb

## 2010-11-07 DIAGNOSIS — D509 Iron deficiency anemia, unspecified: Secondary | ICD-10-CM

## 2010-11-07 DIAGNOSIS — K436 Other and unspecified ventral hernia with obstruction, without gangrene: Secondary | ICD-10-CM | POA: Insufficient documentation

## 2010-11-07 DIAGNOSIS — K429 Umbilical hernia without obstruction or gangrene: Secondary | ICD-10-CM

## 2010-11-07 DIAGNOSIS — K745 Biliary cirrhosis, unspecified: Secondary | ICD-10-CM

## 2010-11-07 DIAGNOSIS — Z86010 Personal history of colonic polyps: Secondary | ICD-10-CM

## 2010-11-07 DIAGNOSIS — Z1231 Encounter for screening mammogram for malignant neoplasm of breast: Secondary | ICD-10-CM | POA: Insufficient documentation

## 2010-11-07 DIAGNOSIS — E119 Type 2 diabetes mellitus without complications: Secondary | ICD-10-CM

## 2010-11-07 NOTE — Progress Notes (Signed)
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 She is on Actigall 30 mg twice a day and is asymptomatic in terms of any hepatobiliary or upper gastrointestinal issues. She does complain of pencil-shaped stools with some diuretic in the area but no definite melena or hematochezia. Last colonoscopy was 8 years ago. She also has a known umbilical hernia, long-term diabetes which recently has required the addition of insulin therapy, and recently had some mild iron deficiency anemia diagnosed by Dr. Rocky Link . The patient is on Coumadin 5 mg a day for paroxysmal atrial fibrillation.  Current Medications, Allergies, Past Medical History, Past Surgical History, Family History and Social History were reviewed in [employer] record.  Pertinent Review of Systems Negative.. no mental status changes, pruritus, or edema. She also denies any cardiovascular pulmonary complaints at this time.   Physical Exam: Cannot appreciate stigmata of chronic liver disease. Her abdomen is somewhat obese with a prominent umbilical hernia. There is no definite organomegaly, masses, tenderness, or ascites.  Extremities showed no edema, phlebitis, or swollen joints. Mental status is normal.    Assessment and Plan: Nonprogressive primary biliary cirrhosis without evidence of hepatic decompensation. I have scheduled followup ultrasound exam. Recent liver function tests were normal except for slightly elevated alkaline phosphatase. I have decreased her Urso to 300 mg a day. She is due for colonoscopy we will schedule this for one to 2 months time with adjustment in her insulin and Coumadin medications at that time. Review of her labs does show evidence of iron deficiency anemia, and we will also perform endoscopy at the time of her colonoscopy.  Please copy her primary care physician, referring physician, and pertinent subspecialists. No diagnosis  found.

## 2010-11-07 NOTE — Patient Instructions (Signed)
Your colonoscopy is scheduled for 01/08/2011 at 8:30am, please come back for previsit on 12/11/2010 at 10am 3rd floor [facility]. Your abdominal ultrasound is scheduled for 11/14/2010 arrive at 9:15am for a 9:30am appt at [facility]. Please have nothing to eat or drink after midnight. Buy Benefiber OTC and take once a day.

## 2010-11-14 ENCOUNTER — Ambulatory Visit (INDEPENDENT_AMBULATORY_CARE_PROVIDER_SITE_OTHER): Payer: Medicare Other | Admitting: *Deleted

## 2010-11-14 ENCOUNTER — Ambulatory Visit (HOSPITAL_COMMUNITY)
Admission: RE | Admit: 2010-11-14 | Discharge: 2010-11-14 | Disposition: A | Discharge status: Home / Self Care | Payer: Medicare Other | Source: Ambulatory Visit | Attending: Gastroenterology | Admitting: Gastroenterology

## 2010-11-14 DIAGNOSIS — K745 Biliary cirrhosis, unspecified: Secondary | ICD-10-CM

## 2010-11-14 DIAGNOSIS — R109 Unspecified abdominal pain: Secondary | ICD-10-CM | POA: Insufficient documentation

## 2010-11-14 DIAGNOSIS — I4891 Unspecified atrial fibrillation: Secondary | ICD-10-CM

## 2010-11-14 DIAGNOSIS — N281 Cyst of kidney, acquired: Secondary | ICD-10-CM | POA: Insufficient documentation

## 2010-11-14 DIAGNOSIS — K8020 Calculus of gallbladder without cholecystitis without obstruction: Secondary | ICD-10-CM | POA: Insufficient documentation

## 2010-11-14 DIAGNOSIS — Z86010 Personal history of colonic polyps: Secondary | ICD-10-CM

## 2010-11-14 LAB — POCT INR: INR: 1.9

## 2010-11-29 ENCOUNTER — Encounter: Payer: Self-pay | Admitting: Internal Medicine

## 2010-12-09 ENCOUNTER — Encounter: Payer: Self-pay | Admitting: *Deleted

## 2010-12-12 ENCOUNTER — Ambulatory Visit (INDEPENDENT_AMBULATORY_CARE_PROVIDER_SITE_OTHER): Payer: Medicare Other | Admitting: *Deleted

## 2010-12-12 ENCOUNTER — Ambulatory Visit (AMBULATORY_SURGERY_CENTER): Payer: Medicare Other | Admitting: *Deleted

## 2010-12-12 VITALS — Ht 64.0 in | Wt 188.0 lb

## 2010-12-12 DIAGNOSIS — I4891 Unspecified atrial fibrillation: Secondary | ICD-10-CM

## 2010-12-12 DIAGNOSIS — Z1211 Encounter for screening for malignant neoplasm of colon: Secondary | ICD-10-CM

## 2010-12-12 LAB — POCT INR: INR: 1.7

## 2010-12-12 NOTE — Progress Notes (Signed)
At pt's PV, pt states that she was not aware that an endoscopy was scheduled to be done.  Pt states, "I am not comfortable doing that until I talk to Dr. Sharlett Iles.  I wasn't aware that he wanted to do that and have some questions to be answered."  Pt scheduled for an OV on 12-26-10

## 2010-12-26 ENCOUNTER — Ambulatory Visit (INDEPENDENT_AMBULATORY_CARE_PROVIDER_SITE_OTHER): Payer: Medicare Other | Admitting: Gastroenterology

## 2010-12-26 ENCOUNTER — Ambulatory Visit: Payer: Medicare Other | Admitting: Gastroenterology

## 2010-12-26 ENCOUNTER — Encounter: Payer: Self-pay | Admitting: Gastroenterology

## 2010-12-26 ENCOUNTER — Telehealth: Payer: Self-pay | Admitting: *Deleted

## 2010-12-26 VITALS — BP 168/98 | HR 80 | Ht 64.0 in | Wt 188.8 lb

## 2010-12-26 DIAGNOSIS — D509 Iron deficiency anemia, unspecified: Secondary | ICD-10-CM

## 2010-12-26 MED ORDER — PEG-KCL-NACL-NASULF-NA ASC-C 100 G PO SOLR: 1.0000 | Freq: Once | ORAL | Status: DC

## 2010-12-26 NOTE — Progress Notes (Signed)
This is a 76 year old Caucasian female with multiple medical problems including diabetes and chronic anticoagulation. We have scheduled her for endoscopy and colonoscopy because of iron deficiency anemia with adjustments in her diabetic medications and her Coumadin. We had suggested endoscopy and colonoscopy. She returns today for discussion related that she was not aware that she was to have an endoscopic exam.  Current Medications, Allergies, Past Medical History, Past Surgical History, Family History and Social History were reviewed in [employer] record.         Assessment and Plan: Reason for endoscopy and colonoscopy again reviewed with the patient in detail. I have scheduled both of these procedures because of the mitigating circumstances of her anticoagulation and diabetes. We have consulted with her cardiologist also. The nurse again were reviewed the procedure, is prepped, and her medication adjustments. She is to continue other medications as outlined in her chart. General physical exam was not performed today. No diagnosis found.

## 2010-12-26 NOTE — Patient Instructions (Signed)
Your procedure has been scheduled for 01/08/2011, please follow the seperate instructions.  Your prescription(s) have been sent to you pharmacy.

## 2010-12-26 NOTE — Telephone Encounter (Signed)
Advised on her diabetic medication before her procedure. She verbalized understanding.

## 2010-12-29 ENCOUNTER — Other Ambulatory Visit: Payer: Self-pay | Admitting: Internal Medicine

## 2010-12-31 ENCOUNTER — Telehealth: Payer: Self-pay | Admitting: Gastroenterology

## 2010-12-31 NOTE — Telephone Encounter (Signed)
Instructed to take insulin 7 units night before procedure and hold all oral medication the morning of the procedure.

## 2011-01-02 ENCOUNTER — Ambulatory Visit (INDEPENDENT_AMBULATORY_CARE_PROVIDER_SITE_OTHER): Payer: Medicare Other | Admitting: Internal Medicine

## 2011-01-02 ENCOUNTER — Ambulatory Visit (INDEPENDENT_AMBULATORY_CARE_PROVIDER_SITE_OTHER): Payer: Medicare Other | Admitting: *Deleted

## 2011-01-02 ENCOUNTER — Encounter: Payer: Self-pay | Admitting: Internal Medicine

## 2011-01-02 VITALS — BP 144/84 | HR 70 | Ht 64.0 in | Wt 187.0 lb

## 2011-01-02 DIAGNOSIS — I4891 Unspecified atrial fibrillation: Secondary | ICD-10-CM

## 2011-01-02 DIAGNOSIS — I498 Other specified cardiac arrhythmias: Secondary | ICD-10-CM

## 2011-01-02 LAB — POCT INR: INR: 2.2

## 2011-01-02 NOTE — Assessment & Plan Note (Signed)
Continues on Rythmol which she is tolerating well. We will continue also the warfarin and stop her aspirin

## 2011-01-02 NOTE — Assessment & Plan Note (Signed)
Tolerating the Rythmol

## 2011-01-02 NOTE — Progress Notes (Signed)
  HPI  Sara Hancock is a 76 y.o. female seen in followup for atrial fibrillation that is paroxysmal. It occurs in the setting of background bradycardia and mild first-degree AV block. She is doing quite well without recurrent tachypalpitations.  She is tolerating [name] without complaint  She denies exercise intolerance chest pain or shortness of breath there. There is no dizziness  Her hemoglobin A1c has been better with the recent introduction of insulin  Past Medical History  Diagnosis Date  . Type II or unspecified type diabetes mellitus without mention of complication, not stated as uncontrolled   . Pure hypercholesterolemia   . Unspecified essential hypertension   . Anemia, unspecified   . Biliary cirrhosis   . Diverticulosis of colon with hemorrhage   . Personal history of colonic polyps     Past Surgical History  Procedure Date  . Thyroidectomy, partial 1980  . Tubal ligation 1972  . Breast cyst excision     left    Current Outpatient Prescriptions  Medication Sig Dispense Refill  . aspirin 81 MG tablet Take 81 mg by mouth daily.        Marland Kitchen FeFum-FePo-FA-B Cmp-C-Zn-Mn-Cu (TANDEM PLUS) 162-115.2-1 MG CAPS Take 1 capsule by mouth daily.  30 each  2  . fexofenadine (ALLEGRA) 180 MG tablet Take 180 mg by mouth daily.        . furosemide (LASIX) 40 MG tablet Take 40 mg by mouth daily.        . insulin detemir (LEVEMIR) 100 UNIT/ML injection Inject 14 Units into the skin at bedtime.       Marland Kitchen losartan (COZAAR) 100 MG tablet Take 100 mg by mouth 2 (two) times daily.        . metFORMIN (GLUCOPHAGE) 1000 MG tablet Take 1,000 mg by mouth 2 (two) times daily with a meal.        . metoprolol (LOPRESSOR) 50 MG tablet Take 1/2 by mouth every AM and 1 every PM       . nystatin (MYCOSTATIN) cream Apply 1 application topically as needed.       Marland Kitchen RYTHMOL SR 325 MG 12 hr capsule TAKE ONE CAPSULE BY MOUTH EVERY 12 HOURS  60 capsule  4  . simvastatin (ZOCOR) 40 MG tablet Take 40 mg  by mouth daily.        . sitaGLIPtin (JANUVIA) 100 MG tablet Take 100 mg by mouth daily.        . ursodiol (ACTIGALL) 300 MG capsule Take 300 mg by mouth 2 (two) times daily.        Marland Kitchen warfarin (COUMADIN) 5 MG tablet Take by mouth as directed.          Allergies  Allergen Reactions  . Ace Inhibitors   . Triple Antibiotic     Review of Systems negative except from HPI and PMH  Physical Exam Well developed and well nourished in no acute distress HENT normal E scleral and icterus clear Neck Supple JVP flat; carotids brisk and full Clear to ausculation Regular rate and rhythm, no murmurs gallops or rub Soft with active bowel sounds No clubbing cyanosis and edema Alert and oriented, grossly normal motor and sensory function Skin Warm and Dry  ECG demonstrated sinus rhythm at 70 intervals 0.21/0.09/0.40 otherwise normal  Assessment and  Plan

## 2011-01-02 NOTE — Assessment & Plan Note (Signed)
Stable and mild

## 2011-01-02 NOTE — Patient Instructions (Signed)
Your physician wants you to follow-up in: 1 year  You will receive a reminder letter in the mail two months in advance. If you don't receive a letter, please call our office to schedule the follow-up appointment.  Your physician recommends that you continue on your current medications as directed. Please refer to the Current Medication list given to you today.  

## 2011-01-08 ENCOUNTER — Encounter: Payer: Medicare Other | Admitting: Gastroenterology

## 2011-01-08 ENCOUNTER — Other Ambulatory Visit: Payer: Self-pay | Admitting: Gastroenterology

## 2011-01-08 ENCOUNTER — Encounter: Payer: Self-pay | Admitting: Gastroenterology

## 2011-01-08 ENCOUNTER — Ambulatory Visit (AMBULATORY_SURGERY_CENTER): Payer: Medicare Other | Admitting: Gastroenterology

## 2011-01-08 VITALS — BP 152/83 | HR 65 | Temp 96.9°F | Resp 16 | Ht 64.0 in | Wt 188.0 lb

## 2011-01-08 DIAGNOSIS — K449 Diaphragmatic hernia without obstruction or gangrene: Secondary | ICD-10-CM | POA: Insufficient documentation

## 2011-01-08 DIAGNOSIS — D126 Benign neoplasm of colon, unspecified: Secondary | ICD-10-CM

## 2011-01-08 DIAGNOSIS — Z860101 Personal history of adenomatous and serrated colon polyps: Secondary | ICD-10-CM

## 2011-01-08 DIAGNOSIS — D509 Iron deficiency anemia, unspecified: Secondary | ICD-10-CM

## 2011-01-08 DIAGNOSIS — Z7901 Long term (current) use of anticoagulants: Secondary | ICD-10-CM

## 2011-01-08 DIAGNOSIS — K222 Esophageal obstruction: Secondary | ICD-10-CM

## 2011-01-08 DIAGNOSIS — K2940 Chronic atrophic gastritis without bleeding: Secondary | ICD-10-CM

## 2011-01-08 DIAGNOSIS — Z86010 Personal history of colonic polyps: Secondary | ICD-10-CM | POA: Insufficient documentation

## 2011-01-08 DIAGNOSIS — K2960 Other gastritis without bleeding: Secondary | ICD-10-CM

## 2011-01-08 DIAGNOSIS — D649 Anemia, unspecified: Secondary | ICD-10-CM

## 2011-01-08 DIAGNOSIS — K209 Esophagitis, unspecified without bleeding: Secondary | ICD-10-CM

## 2011-01-08 DIAGNOSIS — K2961 Other gastritis with bleeding: Secondary | ICD-10-CM

## 2011-01-08 DIAGNOSIS — K219 Gastro-esophageal reflux disease without esophagitis: Secondary | ICD-10-CM

## 2011-01-08 HISTORY — DX: Benign neoplasm of colon, unspecified: D12.6

## 2011-01-08 HISTORY — DX: Esophageal obstruction: K22.2

## 2011-01-08 HISTORY — DX: Esophagitis, unspecified without bleeding: K20.90

## 2011-01-08 HISTORY — DX: Other gastritis without bleeding: K29.60

## 2011-01-08 HISTORY — DX: Long term (current) use of anticoagulants: Z79.01

## 2011-01-08 HISTORY — DX: Personal history of colonic polyps: Z86.010

## 2011-01-08 HISTORY — DX: Diaphragmatic hernia without obstruction or gangrene: K44.9

## 2011-01-08 HISTORY — DX: Iron deficiency anemia, unspecified: D50.9

## 2011-01-08 HISTORY — DX: Personal history of adenomatous and serrated colon polyps: Z86.0101

## 2011-01-08 HISTORY — DX: Gastro-esophageal reflux disease without esophagitis: K21.9

## 2011-01-08 LAB — GLUCOSE, CAPILLARY
Glucose-Capillary: 177 mg/dL — ABNORMAL HIGH (ref 70–99)
Glucose-Capillary: 155 mg/dL — ABNORMAL HIGH (ref 70–99)

## 2011-01-08 MED ORDER — SODIUM CHLORIDE 0.9 % IV SOLN: 500.0000 mL | INTRAVENOUS | Status: DC

## 2011-01-08 MED ORDER — ESOMEPRAZOLE MAGNESIUM 40 MG PO CPDR: 40.0000 mg | DELAYED_RELEASE_CAPSULE | Freq: Every day | ORAL | Status: DC

## 2011-01-08 NOTE — Progress Notes (Signed)
PT STOPPED HER COUMADIN ON 01-02-11 AND  WILL NEED INSTRUCTION AS TO WHEN TO RESTART THAT PER THE PT. EWM

## 2011-01-08 NOTE — Patient Instructions (Addendum)
Please refer to your blue and neon green sheets for instructions regarding diet and activity for the rest of today  Nexium samples given. Take one by mouth every morning before breakfast.  You may resume your medications as you would normally take them, including Coumadin.   Pyloric Stenosis Pyloric stenosis is a fairly common condition during the first two months of life. It requires surgery. Pyloric stenosis means that the opening (pylorus) out of the stomach is narrowed (stenosis). The narrowing is caused by an increase in the muscles of the pylorus. This increase in muscle results in a narrowing. This blocks food from passing out of the stomach into the small bowel. It is slightly more common in boys. SIGNS AND SYMPTOMS OF PYLORIC STENOSIS The signs of pyloric stenosis are usually vomiting in the first two to four weeks of life. This becomes more forceful over time (projectile vomiting). The vomitus is not greenish or bile stained in color. As the blockage becomes more severe, there is weight loss. DIAGNOSIS Your caregiver can often make the diagnosis (learning what is wrong) with a good history and physical exam. Their first impression is often proven with an ultrasound of the abdomen (belly). Sometimes an x-ray study using contrast material is done. Contrast material is something given the baby by mouth. It outlines the inside of the stomach and small bowel on the x-ray. This shows the narrowing of the channel between the stomach and the small bowel. TREATMENT The treatment for this condition is surgery. It is called a pyloromyotomy. The procedure splits the little muscle that surrounds the pylorus. This allows the food to get out of the stomach. This procedure is done after the baby's fluids and electrolyte (salts in the blood) are stable and the baby is doing well. The operation typically takes 30-60 minutes. Usually the baby will be able to take formula within one to two days. Rapid recovery  is usual. Once treated, the problem does not recur. Document Released: 01/21/2001 Document Re-Released: 05/20/2009 [facility] Patient Information 2011 [place]. Esophagitis (Heartburn) Esophagitis (heartburn) is a painful, burning sensation in the chest. It may feel worse in certain positions, such as lying down or bending over. It is caused by stomach acid backing up into the tube that carries food from the mouth down to the stomach (lower esophagus). TREATMENT There are a number of non-prescription medicines used to treat heartburn, including:  Antacids.   Acid reducers (also called H-2 blockers).   Proton-pump inhibitors.  HOME CARE INSTRUCTIONS  Raise the head of your bead by putting blocks under the legs.   Eat 2-3 hours before going to bed.   Stop smoking.   Try to reach and maintain a healthy weight.   Do not eat just a few very large meals. Instead, eat many smaller meals throughout the day.   Try to identify foods and beverages that make your symptoms worse, and avoid these.   Avoid tight clothing.   Do not exercise right after eating.  SEEK IMMEDIATE MEDICAL CARE IF YOU:  Have severe chest pain that goes down your arm, or into your jaw or neck.   Feel sweaty, dizzy, or lightheaded.   Are short of breath.   Throw up (vomit) blood.   Have difficulty or pain with swallowing.   Have bloody or black, tarry stools.   Have bouts of heartburn more than three times a week for more than two weeks.  Document Released: 06/05/2004 Document Re-Released: 07/23/2009 [facility] Patient Information 2011 [place]. Hiatal Hernia A hiatal hernia occurs when a part of the stomach slides above the diaphragm. The diaphragm is the thin muscle separating the belly (abdomen) from the chest. A hiatal hernia can be something you are born with or develop over time. Hiatal hernias may allow stomach acid to flow back into your esophagus, the tube which carries food from your mouth  to your stomach. If this acid causes problems it is called GERD (gastro-esophageal reflux disease).  SYMPTOMS Common symptoms of GERD are heartburn (burning in your chest). This is worse when lying down or bending over. It may also cause belching and indigestion. Some of the things which make GERD worse are:  Increased weight pushes on stomach making acid rise more easily.   Smoking markedly increases acid production.   Alcohol decreases lower esophageal sphincter pressure (valve between stomach and esophagus), allowing acid from stomach into esophagus.   Late evening meals and going to bed with a full stomach increases pressure.   Anything that causes an increase in acid production.   Lower esophageal sphincter incompetence.  DIAGNOSIS Hiatal hernia is often diagnosed with x-rays of your stomach and small bowel. This is called an UGI (upper gastrointestinal x-ray). Sometimes a gastroscopic procedure is done. This is a procedure where your caregiver uses a flexible instrument to look into the stomach and small bowel. HOME CARE INSTRUCTIONS  Try to achieve and maintain an ideal body weight.   Avoid drinking alcoholic beverages.   Stop smoking.   Put the head of your bed on 4 to 6 inch blocks. This will keep your head and esophagus higher than your stomach. If you cannot use blocks, sleep with several pillows under your head and shoulders.   Over-the-counter medications will decrease acid production. Your caregiver can also prescribe medications for this. Take as directed.   1/2 to 1 teaspoon of an antacid taken every hour while awake, with meals and at bedtime, will neutralize acid.   DO NOT take aspirin, ibuprofen (Advil or Motrin), or other nonsteroidal anti-inflammatory drugs.   Do not wear tight clothing around your chest or stomach.   Eat smaller meals and eat more frequently. This keeps your stomach from getting too full. Eat slowly.   Do not lie down for 2 or 3 hours  after eating. Do not eat or drink anything 1 to 2 hours before going to bed.   Avoid caffeine beverages (colas, coffee, cocoa, tea), fatty foods, citrus fruits and all other foods and drinks that contain acid and that seem to increase the problems.   Avoid bending over, especially after eating. Also avoid straining during bowel movements or when urinating or lifting things. Anything that increases the pressure in your belly increases the amount of acid that may be pushed up into your esophagus.  SEEK IMMEDIATE MEDICAL ATTENTION IF:  There is change in location (pain in arms, neck, jaw, teeth or back) of your pain, or the pain is getting worse.   You also experience nausea, vomiting, sweating (diaphoresis), or shortness of breath.   You develop continual vomiting, vomit blood or coffee ground material, have bright red blood in your stools, or have black tarry stools.  Some of these symptoms could signal other problems such as heart disease. MAKE SURE YOU:   Understand these instructions.   Monitor your condition.   Contact your caregiver if you are not doing well or are getting worse.  Document Released: 07/19/2003 Document Re-Released: 07/25/2008 [facility] Patient Information 2011 [place].  Polyps, Colon  A polyp is extra tissue that grows inside your body. Colon polyps grow in the large intestine. The large intestine, also called the colon, is part of your digestive system. It is a long, hollow tube at the end of your digestive tract where your body makes and stores stool. Most polyps are not dangerous. They are benign. This means they are not cancerous. But over time, some types of polyps can turn into cancer. Polyps that are smaller than a pea are usually not harmful. But larger polyps could someday become or may already be cancerous. To be safe, doctors remove all polyps and test them.  WHO GETS POLYPS? Anyone can get polyps, but certain people are more likely than others. You  may have a greater chance of getting polyps if:  You are over 50.   You have had polyps before.   Someone in your family has had polyps.   Someone in your family has had cancer of the large intestine.   Find out if someone in your family has had polyps. You may also be more likely to get polyps if you:   Eat a lot of fatty foods   Smoke   Drink alcohol   Do not exercise  Eat too much  SYMPTOMS Most small polyps do not cause symptoms. People often do not know they have one until their caregiver finds it during a regular checkup or while testing them for something else. Some people do have symptoms like these:  Bleeding from the anus. You might notice blood on your underwear or on toilet paper after you have had a bowel movement.   Constipation or diarrhea that lasts more than a week.   Blood in the stool. Blood can make stool look black or it can show up as red streaks in the stool.  If you have any of these symptoms, see your caregiver. HOW DOES THE DOCTOR TEST FOR POLYPS? The doctor can use four tests to check for polyps:  Digital rectal exam. The caregiver wears gloves and checks your rectum (the last part of the large intestine) to see if it feels normal. This test would find polyps only in the rectum. Your caregiver may need to do one of the other tests listed below to find polyps higher up in the intestine.   Barium enema. The caregiver puts a liquid called barium into your rectum before taking x-rays of your large intestine. Barium makes your intestine look white in the pictures. Polyps are dark, so they are easy to see.   Sigmoidoscopy. With this test, the caregiver can see inside your large intestine. A thin flexible tube is placed into your rectum. The device is called a sigmoidoscope, which has a light and a tiny video camera in it. The caregiver uses the sigmoidoscope to look at the last third of your large intestine.   Colonoscopy. This test is like sigmoidoscopy, but  the caregiver looks at all of the large intestine. It usually requires sedation. This is the most common method for finding and removing polyps.  TREATMENT  The caregiver will remove the polyp during sigmoidoscopy or colonoscopy. The polyp is then tested for cancer.   If you have had polyps, your caregiver may want you to get tested regularly in the future.  PREVENTION There is not one sure way to prevent polyps. You might be able to lower your risk of getting them if you:  Eat more fruits and vegetables and less fatty food.  Do not smoke.   Avoid alcohol.   Exercise every day.   Lose weight if you are overweight.   Eating more calcium and folate can also lower your risk of getting polyps. Some foods that are rich in calcium are milk, cheese, and broccoli. Some foods that are rich in folate are chickpeas, kidney beans, and spinach.   Aspirin might help prevent polyps. Studies are under way.  Document Released: 01/23/2004 Document Re-Released: 10/16/2009 [employer] Patient Information 2011 [employer]. 

## 2011-01-09 ENCOUNTER — Telehealth: Payer: Self-pay

## 2011-01-09 NOTE — Telephone Encounter (Signed)

## 2011-01-10 DIAGNOSIS — K5731 Diverticulosis of large intestine without perforation or abscess with bleeding: Secondary | ICD-10-CM

## 2011-01-10 DIAGNOSIS — K2961 Other gastritis with bleeding: Secondary | ICD-10-CM

## 2011-01-10 LAB — HELICOBACTER PYLORI SCREEN-BIOPSY: UREASE: NEGATIVE

## 2011-01-16 ENCOUNTER — Encounter: Payer: Self-pay | Admitting: Gastroenterology

## 2011-01-17 ENCOUNTER — Ambulatory Visit (INDEPENDENT_AMBULATORY_CARE_PROVIDER_SITE_OTHER): Payer: Medicare Other | Admitting: *Deleted

## 2011-01-17 DIAGNOSIS — I4891 Unspecified atrial fibrillation: Secondary | ICD-10-CM

## 2011-01-17 LAB — POCT INR: INR: 1.9

## 2011-01-22 ENCOUNTER — Telehealth: Payer: Self-pay | Admitting: *Deleted

## 2011-01-22 NOTE — Telephone Encounter (Signed)
Pt due for labs this week, order already in epic. Advised husband that pt is due for labs this week. He will let pt know.

## 2011-01-22 NOTE — Telephone Encounter (Signed)
Message copied by Sheral Flow on Wed Jan 22, 2011  8:09 AM ------      Message from: Bernita Buffy D      Created: Wed Oct 30, 2010 10:11 AM       Needs cbc and iron studies. Order already in [place] call pt to remind her

## 2011-01-24 ENCOUNTER — Other Ambulatory Visit (INDEPENDENT_AMBULATORY_CARE_PROVIDER_SITE_OTHER): Payer: Medicare Other

## 2011-01-24 DIAGNOSIS — R6889 Other general symptoms and signs: Secondary | ICD-10-CM

## 2011-01-24 DIAGNOSIS — D509 Iron deficiency anemia, unspecified: Secondary | ICD-10-CM

## 2011-01-24 LAB — CBC WITH DIFFERENTIAL/PLATELET
Basophils Absolute: 0 10*3/uL (ref 0.0–0.1)
Basophils Relative: 0.2 % (ref 0.0–3.0)
Eosinophils Absolute: 0 10*3/uL (ref 0.0–0.7)
Eosinophils Relative: 0.8 % (ref 0.0–5.0)
HCT: 35.2 % — ABNORMAL LOW (ref 36.0–46.0)
Hemoglobin: 11.8 g/dL — ABNORMAL LOW (ref 12.0–15.0)
Lymphocytes Relative: 32.2 % (ref 12.0–46.0)
Lymphs Abs: 1.8 10*3/uL (ref 0.7–4.0)
MCHC: 33.5 g/dL (ref 30.0–36.0)
MCV: 90.9 fl (ref 78.0–100.0)
Monocytes Absolute: 0.5 10*3/uL (ref 0.1–1.0)
Monocytes Relative: 9.3 % (ref 3.0–12.0)
Neutro Abs: 3.3 10*3/uL (ref 1.4–7.7)
Neutrophils Relative %: 57.5 % (ref 43.0–77.0)
Platelets: 255 10*3/uL (ref 150.0–400.0)
RBC: 3.87 Mil/uL (ref 3.87–5.11)
RDW: 15.6 % — ABNORMAL HIGH (ref 11.5–14.6)
WBC: 5.7 10*3/uL (ref 4.5–10.5)

## 2011-01-24 LAB — VITAMIN B12: Vitamin B-12: 553 pg/mL (ref 211–911)

## 2011-01-24 LAB — FOLATE: Folate: >24.8 ng/mL (ref >5.9)

## 2011-01-24 LAB — IBC PANEL
Iron: 61 ug/dL (ref 42–145)
Saturation Ratios: 15.5 % — ABNORMAL LOW (ref 20.0–50.0)
Transferrin: 281.6 mg/dL (ref 212.0–360.0)

## 2011-01-24 LAB — FERRITIN: Ferritin: 15.6 ng/mL (ref 10.0–291.0)

## 2011-01-30 ENCOUNTER — Encounter: Payer: Medicare Other | Admitting: *Deleted

## 2011-02-05 NOTE — Progress Notes (Signed)
Addended by: Lowry Ram on: 02/05/2011 04:18 PM   Modules accepted: Level of Service

## 2011-02-06 ENCOUNTER — Other Ambulatory Visit: Payer: Self-pay | Admitting: Internal Medicine

## 2011-02-06 ENCOUNTER — Other Ambulatory Visit: Payer: Self-pay | Admitting: Gastroenterology

## 2011-02-13 ENCOUNTER — Encounter: Payer: Medicare Other | Admitting: *Deleted

## 2011-02-13 ENCOUNTER — Ambulatory Visit (INDEPENDENT_AMBULATORY_CARE_PROVIDER_SITE_OTHER): Payer: Medicare Other | Admitting: *Deleted

## 2011-02-13 DIAGNOSIS — I4891 Unspecified atrial fibrillation: Secondary | ICD-10-CM

## 2011-02-13 LAB — POCT INR: INR: 1.9

## 2011-03-03 ENCOUNTER — Ambulatory Visit (INDEPENDENT_AMBULATORY_CARE_PROVIDER_SITE_OTHER): Payer: Medicare Other | Admitting: *Deleted

## 2011-03-03 DIAGNOSIS — Z7901 Long term (current) use of anticoagulants: Secondary | ICD-10-CM

## 2011-03-03 DIAGNOSIS — I4891 Unspecified atrial fibrillation: Secondary | ICD-10-CM

## 2011-03-03 LAB — POCT INR: INR: 2.8

## 2011-03-04 ENCOUNTER — Other Ambulatory Visit: Payer: Self-pay | Admitting: Gastroenterology

## 2011-03-04 MED ORDER — ESOMEPRAZOLE MAGNESIUM 40 MG PO CPDR: DELAYED_RELEASE_CAPSULE | ORAL | Status: DC

## 2011-03-04 NOTE — Telephone Encounter (Signed)
Notified pt I ordered her Nexium.

## 2011-03-27 ENCOUNTER — Encounter: Payer: Medicare Other | Admitting: *Deleted

## 2011-03-28 ENCOUNTER — Ambulatory Visit (INDEPENDENT_AMBULATORY_CARE_PROVIDER_SITE_OTHER): Payer: Medicare Other | Admitting: *Deleted

## 2011-03-28 DIAGNOSIS — I4891 Unspecified atrial fibrillation: Secondary | ICD-10-CM

## 2011-03-28 DIAGNOSIS — Z7901 Long term (current) use of anticoagulants: Secondary | ICD-10-CM

## 2011-03-28 LAB — POCT INR: INR: 1.8

## 2011-04-12 ENCOUNTER — Other Ambulatory Visit: Payer: Self-pay | Admitting: Gastroenterology

## 2011-04-17 ENCOUNTER — Ambulatory Visit (INDEPENDENT_AMBULATORY_CARE_PROVIDER_SITE_OTHER): Payer: Medicare Other | Admitting: *Deleted

## 2011-04-17 ENCOUNTER — Encounter: Payer: Medicare Other | Admitting: *Deleted

## 2011-04-17 DIAGNOSIS — I4891 Unspecified atrial fibrillation: Secondary | ICD-10-CM

## 2011-04-17 DIAGNOSIS — Z7901 Long term (current) use of anticoagulants: Secondary | ICD-10-CM

## 2011-04-17 LAB — POCT INR: INR: 2.6

## 2011-05-03 ENCOUNTER — Other Ambulatory Visit: Payer: Self-pay | Admitting: Gastroenterology

## 2011-05-09 ENCOUNTER — Other Ambulatory Visit: Payer: Self-pay | Admitting: Internal Medicine

## 2011-05-12 ENCOUNTER — Telehealth: Payer: Self-pay | Admitting: Internal Medicine

## 2011-05-12 MED ORDER — RYTHMOL SR 325 MG PO CP12: 325.0000 mg | ORAL_CAPSULE | Freq: Two times a day (BID) | ORAL | Status: DC

## 2011-05-12 NOTE — Telephone Encounter (Signed)
New Problem:     Patient called in wanting a refill of [name] SR 325 MG 12 hr capsule called in today because she is on medicare and her insurance changes tomorrow. Please call when the order has been placed.

## 2011-05-14 ENCOUNTER — Encounter: Payer: Medicare Other | Admitting: *Deleted

## 2011-05-14 ENCOUNTER — Ambulatory Visit (INDEPENDENT_AMBULATORY_CARE_PROVIDER_SITE_OTHER): Payer: Medicare Other | Admitting: *Deleted

## 2011-05-14 DIAGNOSIS — Z7901 Long term (current) use of anticoagulants: Secondary | ICD-10-CM

## 2011-05-14 DIAGNOSIS — I4891 Unspecified atrial fibrillation: Secondary | ICD-10-CM

## 2011-05-14 LAB — POCT INR: INR: 2.1

## 2011-05-15 ENCOUNTER — Encounter: Payer: Medicare Other | Admitting: *Deleted

## 2011-06-11 ENCOUNTER — Telehealth: Payer: Self-pay | Admitting: Gastroenterology

## 2011-06-11 MED ORDER — SE-TAN PLUS 162-115.2-1 MG PO CAPS: 1.0000 | ORAL_CAPSULE | Freq: Every day | ORAL | Status: DC

## 2011-06-11 NOTE — Telephone Encounter (Signed)
Advised pt that she does not need any repeat labs at this point she had labs 01/2011 and she should continue tandem. i sent new rx.

## 2011-06-12 ENCOUNTER — Ambulatory Visit (INDEPENDENT_AMBULATORY_CARE_PROVIDER_SITE_OTHER): Payer: Medicare Other | Admitting: Pharmacist

## 2011-06-12 DIAGNOSIS — I4891 Unspecified atrial fibrillation: Secondary | ICD-10-CM

## 2011-06-12 DIAGNOSIS — Z7901 Long term (current) use of anticoagulants: Secondary | ICD-10-CM

## 2011-06-12 LAB — POCT INR: INR: 2.5

## 2011-07-03 DIAGNOSIS — IMO0002 Reserved for concepts with insufficient information to code with codable children: Secondary | ICD-10-CM

## 2011-07-03 DIAGNOSIS — H33311 Horseshoe tear of retina without detachment, right eye: Secondary | ICD-10-CM | POA: Insufficient documentation

## 2011-07-03 HISTORY — DX: Reserved for concepts with insufficient information to code with codable children: IMO0002

## 2011-07-03 HISTORY — DX: Horseshoe tear of retina without detachment, right eye: H33.311

## 2011-07-04 ENCOUNTER — Telehealth: Payer: Self-pay | Admitting: Internal Medicine

## 2011-07-04 MED ORDER — RYTHMOL SR 325 MG PO CP12: 325.0000 mg | ORAL_CAPSULE | Freq: Two times a day (BID) | ORAL | Status: DC

## 2011-07-04 NOTE — Telephone Encounter (Signed)
Pt wants 90 day supply

## 2011-07-04 NOTE — Telephone Encounter (Signed)
Pt wants to switch to generic for rythmol SR 325mg  BID please call into [facility] in [place] on [address] [phone]

## 2011-07-07 ENCOUNTER — Other Ambulatory Visit: Payer: Self-pay | Admitting: Internal Medicine

## 2011-07-07 MED ORDER — PROPAFENONE HCL ER 325 MG PO CP12: 325.0000 mg | ORAL_CAPSULE | Freq: Two times a day (BID) | ORAL | Status: DC

## 2011-07-07 NOTE — Telephone Encounter (Signed)
SENT REFILL TO PHARMACY

## 2011-07-07 NOTE — Telephone Encounter (Signed)
FU Call: Pt needs GENERIC for rythmol called into [facility] in [place]. Pt stated first time we called in name brand rythmol and pt needs GENERIC called in.

## 2011-07-24 ENCOUNTER — Ambulatory Visit (INDEPENDENT_AMBULATORY_CARE_PROVIDER_SITE_OTHER): Payer: Medicare Other | Admitting: *Deleted

## 2011-07-24 DIAGNOSIS — I4891 Unspecified atrial fibrillation: Secondary | ICD-10-CM

## 2011-07-24 DIAGNOSIS — Z7901 Long term (current) use of anticoagulants: Secondary | ICD-10-CM

## 2011-07-24 LAB — POCT INR: INR: 2.4

## 2011-09-04 ENCOUNTER — Ambulatory Visit (INDEPENDENT_AMBULATORY_CARE_PROVIDER_SITE_OTHER): Payer: Medicare Other | Admitting: Pharmacist

## 2011-09-04 DIAGNOSIS — Z7901 Long term (current) use of anticoagulants: Secondary | ICD-10-CM

## 2011-09-04 DIAGNOSIS — I4891 Unspecified atrial fibrillation: Secondary | ICD-10-CM

## 2011-09-04 LAB — POCT INR: INR: 1.9

## 2011-09-28 ENCOUNTER — Other Ambulatory Visit: Payer: Self-pay | Admitting: Internal Medicine

## 2011-09-29 ENCOUNTER — Other Ambulatory Visit (HOSPITAL_COMMUNITY): Payer: Self-pay | Admitting: Family Medicine

## 2011-09-29 DIAGNOSIS — Z1231 Encounter for screening mammogram for malignant neoplasm of breast: Secondary | ICD-10-CM

## 2011-10-16 ENCOUNTER — Ambulatory Visit (INDEPENDENT_AMBULATORY_CARE_PROVIDER_SITE_OTHER): Payer: Medicare Other | Admitting: *Deleted

## 2011-10-16 DIAGNOSIS — I4891 Unspecified atrial fibrillation: Secondary | ICD-10-CM

## 2011-10-16 DIAGNOSIS — Z7901 Long term (current) use of anticoagulants: Secondary | ICD-10-CM

## 2011-10-16 LAB — POCT INR: INR: 2

## 2011-10-21 ENCOUNTER — Telehealth: Payer: Self-pay | Admitting: Gastroenterology

## 2011-10-21 DIAGNOSIS — D509 Iron deficiency anemia, unspecified: Secondary | ICD-10-CM

## 2011-10-21 DIAGNOSIS — E119 Type 2 diabetes mellitus without complications: Secondary | ICD-10-CM

## 2011-10-21 DIAGNOSIS — R6889 Other general symptoms and signs: Secondary | ICD-10-CM

## 2011-10-21 DIAGNOSIS — Z86010 Personal history of colonic polyps: Secondary | ICD-10-CM

## 2011-10-21 DIAGNOSIS — K745 Biliary cirrhosis, unspecified: Secondary | ICD-10-CM

## 2011-10-21 DIAGNOSIS — D649 Anemia, unspecified: Secondary | ICD-10-CM

## 2011-10-22 NOTE — Telephone Encounter (Signed)
Patient wants to know about to know about having labs done before her office visit states that she has them done every year

## 2011-10-26 NOTE — Telephone Encounter (Signed)
[place] and anemia profiles are ok

## 2011-10-27 NOTE — Telephone Encounter (Signed)
Notified pt her labs are in per Dr Sharlett Iles; pt stated understanding.

## 2011-11-27 ENCOUNTER — Ambulatory Visit (HOSPITAL_COMMUNITY): Payer: Medicare Other

## 2011-11-27 ENCOUNTER — Ambulatory Visit (INDEPENDENT_AMBULATORY_CARE_PROVIDER_SITE_OTHER): Payer: Medicare Other | Admitting: *Deleted

## 2011-11-27 ENCOUNTER — Ambulatory Visit (HOSPITAL_COMMUNITY)
Admission: RE | Admit: 2011-11-27 | Discharge: 2011-11-27 | Disposition: A | Discharge status: Home / Self Care | Payer: Medicare Other | Source: Ambulatory Visit | Attending: Family Medicine | Admitting: Family Medicine

## 2011-11-27 DIAGNOSIS — I4891 Unspecified atrial fibrillation: Secondary | ICD-10-CM

## 2011-11-27 DIAGNOSIS — Z1231 Encounter for screening mammogram for malignant neoplasm of breast: Secondary | ICD-10-CM | POA: Insufficient documentation

## 2011-11-27 DIAGNOSIS — Z7901 Long term (current) use of anticoagulants: Secondary | ICD-10-CM

## 2011-11-27 LAB — POCT INR: INR: 2.2

## 2011-12-23 ENCOUNTER — Other Ambulatory Visit: Payer: Self-pay | Admitting: Gastroenterology

## 2011-12-30 ENCOUNTER — Other Ambulatory Visit (INDEPENDENT_AMBULATORY_CARE_PROVIDER_SITE_OTHER): Payer: Medicare Other

## 2011-12-30 DIAGNOSIS — Z79899 Other long term (current) drug therapy: Secondary | ICD-10-CM

## 2011-12-30 DIAGNOSIS — D509 Iron deficiency anemia, unspecified: Secondary | ICD-10-CM

## 2011-12-30 DIAGNOSIS — R6889 Other general symptoms and signs: Secondary | ICD-10-CM

## 2011-12-30 DIAGNOSIS — Z860100 Personal history of colon polyps, unspecified: Secondary | ICD-10-CM

## 2011-12-30 DIAGNOSIS — K745 Biliary cirrhosis, unspecified: Secondary | ICD-10-CM

## 2011-12-30 DIAGNOSIS — D649 Anemia, unspecified: Secondary | ICD-10-CM

## 2011-12-30 DIAGNOSIS — E119 Type 2 diabetes mellitus without complications: Secondary | ICD-10-CM

## 2011-12-30 LAB — HEPATIC FUNCTION PANEL
ALT: 26 U/L (ref 0–35)
AST: 28 U/L (ref 0–37)
Albumin: 3.3 g/dL — ABNORMAL LOW (ref 3.5–5.2)
Alkaline Phosphatase: 163 U/L — ABNORMAL HIGH (ref 39–117)
Bilirubin, Direct: 0.1 mg/dL (ref 0.0–0.3)
Total Bilirubin: 0.6 mg/dL (ref 0.3–1.2)
Total Protein: 7.4 g/dL (ref 6.0–8.3)

## 2011-12-30 LAB — CBC WITH DIFFERENTIAL/PLATELET
Basophils Absolute: 0 10*3/uL (ref 0.0–0.1)
Basophils Relative: 0.3 % (ref 0.0–3.0)
Eosinophils Absolute: 0 10*3/uL (ref 0.0–0.7)
Eosinophils Relative: 0.5 % (ref 0.0–5.0)
HCT: 34.7 % — ABNORMAL LOW (ref 36.0–46.0)
Hemoglobin: 11.4 g/dL — ABNORMAL LOW (ref 12.0–15.0)
Lymphocytes Relative: 30.1 % (ref 12.0–46.0)
Lymphs Abs: 2 10*3/uL (ref 0.7–4.0)
MCHC: 32.7 g/dL (ref 30.0–36.0)
MCV: 93.7 fl (ref 78.0–100.0)
Monocytes Absolute: 0.5 10*3/uL (ref 0.1–1.0)
Monocytes Relative: 7.7 % (ref 3.0–12.0)
Neutro Abs: 4.1 10*3/uL (ref 1.4–7.7)
Neutrophils Relative %: 61.4 % (ref 43.0–77.0)
Platelets: 265 10*3/uL (ref 150.0–400.0)
RBC: 3.71 Mil/uL — ABNORMAL LOW (ref 3.87–5.11)
RDW: 14.3 % (ref 11.5–14.6)
WBC: 6.7 10*3/uL (ref 4.5–10.5)

## 2011-12-30 LAB — IBC PANEL
Iron: 60 ug/dL (ref 42–145)
Saturation Ratios: 18.4 % — ABNORMAL LOW (ref 20.0–50.0)
Transferrin: 233.4 mg/dL (ref 212.0–360.0)

## 2011-12-30 LAB — VITAMIN B12: Vitamin B-12: 443 pg/mL (ref 211–911)

## 2011-12-30 LAB — TSH: TSH: 1.3 u[IU]/mL (ref 0.35–5.50)

## 2011-12-30 LAB — BASIC METABOLIC PANEL
BUN: 18 mg/dL (ref 6–23)
CO2: 29 mEq/L (ref 19–32)
Calcium: 8.8 mg/dL (ref 8.4–10.5)
Chloride: 102 mEq/L (ref 96–112)
Creatinine, Ser: 0.7 mg/dL (ref 0.4–1.2)
GFR: 82.58 mL/min (ref >60.00)
Glucose, Bld: 146 mg/dL — ABNORMAL HIGH (ref 70–99)
Potassium: 4.3 mEq/L (ref 3.5–5.1)
Sodium: 138 mEq/L (ref 135–145)

## 2011-12-30 LAB — FOLATE: Folate: >24.8 ng/mL (ref >5.9)

## 2011-12-30 LAB — FERRITIN: Ferritin: 29.1 ng/mL (ref 10.0–291.0)

## 2012-01-08 ENCOUNTER — Ambulatory Visit (INDEPENDENT_AMBULATORY_CARE_PROVIDER_SITE_OTHER): Payer: Medicare Other | Admitting: Pharmacist

## 2012-01-08 ENCOUNTER — Encounter: Payer: Self-pay | Admitting: Internal Medicine

## 2012-01-08 ENCOUNTER — Ambulatory Visit (INDEPENDENT_AMBULATORY_CARE_PROVIDER_SITE_OTHER): Payer: Medicare Other | Admitting: Internal Medicine

## 2012-01-08 ENCOUNTER — Encounter: Payer: Self-pay | Admitting: Gastroenterology

## 2012-01-08 ENCOUNTER — Ambulatory Visit (INDEPENDENT_AMBULATORY_CARE_PROVIDER_SITE_OTHER): Payer: Medicare Other | Admitting: Gastroenterology

## 2012-01-08 ENCOUNTER — Other Ambulatory Visit: Payer: Medicare Other

## 2012-01-08 VITALS — BP 148/80 | HR 75 | Ht 62.0 in | Wt 182.4 lb

## 2012-01-08 VITALS — BP 124/76 | HR 76 | Ht 62.0 in | Wt 183.0 lb

## 2012-01-08 DIAGNOSIS — I4891 Unspecified atrial fibrillation: Secondary | ICD-10-CM

## 2012-01-08 DIAGNOSIS — K429 Umbilical hernia without obstruction or gangrene: Secondary | ICD-10-CM

## 2012-01-08 DIAGNOSIS — K8020 Calculus of gallbladder without cholecystitis without obstruction: Secondary | ICD-10-CM

## 2012-01-08 DIAGNOSIS — K745 Biliary cirrhosis, unspecified: Secondary | ICD-10-CM

## 2012-01-08 DIAGNOSIS — I10 Essential (primary) hypertension: Secondary | ICD-10-CM

## 2012-01-08 DIAGNOSIS — Z7901 Long term (current) use of anticoagulants: Secondary | ICD-10-CM

## 2012-01-08 LAB — POCT INR: INR: 2.5

## 2012-01-08 MED ORDER — PROPAFENONE HCL ER 325 MG PO CP12: 325.0000 mg | ORAL_CAPSULE | Freq: Two times a day (BID) | ORAL | Status: DC

## 2012-01-08 NOTE — Progress Notes (Signed)
This is a 77 year old Caucasian female with chronic primary biliary cirrhosis doing well on Actigall 800 mg a day. Recent liver function tests been entirely normal. Her GERD is well controlled on Protonix 40 mg a day. We have been following her for many years, and her liver function tests have remained normal, and she has had no evidence of chronic liver disease. She is followed by Dr. Theda Sers, and is on multiple medications for her diabetes, hypertension, and she also is chronically anticoagulated on Coumadin because of cardiac arrhythmias. Last ultrasound a year ago was unremarkable.  Current Medications, Allergies, Past Medical History, Past Surgical History, Family History and Social History were reviewed in [employer] record.  Pertinent Review of Systems Negative   Physical Exam: Blood pressure 124/76, pulse 76 and regular, weight 183 pounds with BMI of 33.47. I cannot appreciate stigmata of chronic liver disease. She has a large umbilical hernia which is tender to touch. I cannot deftly appreciate hepatosplenomegaly, other nominal masses or tenderness. Mental status is normal and there is no asterixis. There is no evidence of peripheral edema or swollen joints.    Assessment and Plan: Chronic stable primary biliary cirrhosis without evidence of progression over many years. Patient does have known gallstones which are asymptomatic. We will continue her Actigall 300 mg a day with yearly liver function tests. I've schedule CT scan of the abdomen to evaluate her large umbilical hernia. She is having almost daily pain, and we'll probably need this repaired. Also followup AMA ordered. She is to continue daily Protonix for her well controlled acid reflux. Encounter Diagnoses  Name Primary?  . Hernia, umbilical Yes  . Gallstones   . Cirrhosis, biliary

## 2012-01-08 NOTE — Patient Instructions (Addendum)
Your physician wants you to follow-up in: 1 year with Dr. Caryl Comes. You will receive a reminder letter in the mail two months in advance. If you don't receive a letter, please call our office to schedule the follow-up appointment.  Your physician recommends that you continue on your current medications as directed. Please refer to the Current Medication list given to you today.

## 2012-01-08 NOTE — Assessment & Plan Note (Signed)
Poorly controlled but better. She is on multiple medications. There may be a role for aldosterone antagonism. In addition, I have explored sleep apnea but she is quite adamant that she does not have it.

## 2012-01-08 NOTE — Patient Instructions (Addendum)
You have been scheduled for a CT scan of the abdomen and pelvis at [facility] ([address]---this is in the same building as [facility]).   You are scheduled on 01-09-12 at 2 p.m.. You should arrive 15 minutes prior to your appointment time for registration. Please follow the written instructions below on the day of your exam:  WARNING: IF YOU ARE ALLERGIC TO IODINE/X-RAY DYE, PLEASE NOTIFY RADIOLOGY IMMEDIATELY AT [phone]! YOU WILL BE GIVEN A 13 HOUR PREMEDICATION PREP.  1) Do not eat or drink anything after 10 a.m. (4 hours prior to your test) 2) You have been given 2 bottles of oral contrast to drink. The solution may taste  better if refrigerated, but do NOT add ice or any other liquid to this solution. Shake well before drinking.    Drink 1 bottle of contrast @ 12 noon (2 hours prior to your exam)  Drink 1 bottle of contrast @ 1 p.m. (1 hour prior to your exam)  You may take any medications as prescribed with a small amount of water except for the following: Metformin, Glucophage, Glucovance, Avandamet, Riomet, Fortamet, Actoplus Met, Janumet, Glumetza or Metaglip. The above medications must be held the day of the exam AND 48 hours after the exam.  The purpose of you drinking the oral contrast is to aid in the visualization of your intestinal tract. The contrast solution may cause some diarrhea. Before your exam is started, you will be given a small amount of fluid to drink. Depending on your individual set of symptoms, you may also receive an intravenous injection of x-ray contrast/dye. Plan on being at [facility] for 30 minutes or long, depending on the type of exam you are having performed.  If you have any questions regarding your exam or if you need to reschedule, you may call the CT department at [phone] between the hours of 8:00 am and 5:00 pm, Monday-Friday.  Your physician has requested that you go to the basement for the following lab work  before leaving today: AMA ________________________________________________________________________ Cc: Sander Radon, M. D.

## 2012-01-08 NOTE — Assessment & Plan Note (Signed)
Currently holding sinus rhythm on popafenone we'll continue her current dose. SHe remains on warfarin; she is not on aspirin

## 2012-01-08 NOTE — Progress Notes (Signed)
Patient Care Team: [name] as PCP - General (Family Medicine)   HPI  Sara Hancock is a 77 y.o. female  seen in followup for atrial fibrillation that is paroxysmal. It occurs in the setting of background bradycardia and mild first-degree AV block. She is doing quite well without recurrent tachypalpitations.  She is tolerating [name] without complaint  She denies exercise intolerance chest pain or shortness of breath      Past Medical History  Diagnosis Date  . Type II or unspecified type diabetes mellitus without mention of complication, not stated as uncontrolled   . Pure hypercholesterolemia   . HTN (hypertension)   . Anemia   . Biliary cirrhosis   . Diverticulosis of colon with hemorrhage 06/08/2000  . Esophageal stenosis 01/08/11  . Hiatal hernia 01/08/11  . Esophagitis 01/08/11  . Hyperplastic colon polyp 06/08/2000, 07/31/2003, 01/08/11  . Hx of adenomatous colonic polyps 01/08/11    Past Surgical History  Procedure Date  . Thyroidectomy, partial 1980  . Tubal ligation 1972  . Breast cyst excision     left  . Colonoscopy   . Polypectomy     Current Outpatient Prescriptions  Medication Sig Dispense Refill  . FeFum-FePo-FA-B Cmp-C-Zn-Mn-Cu (SE-TAN PLUS) 162-115.2-1 MG CAPS Take 1 capsule by mouth daily.  30 capsule  4  . fexofenadine (ALLEGRA) 180 MG tablet Take 180 mg by mouth daily.        . furosemide (LASIX) 40 MG tablet Take 60 mg by mouth daily.       . hydrALAZINE (APRESOLINE) 25 MG tablet Take 25 mg by mouth 2 (two) times daily.      . insulin detemir (LEVEMIR) 100 UNIT/ML injection Inject 16 Units into the skin at bedtime.       . irbesartan (AVAPRO) 150 MG tablet Take 300 mg by mouth daily.       Marland Kitchen ketoconazole (NIZORAL) 2 % cream Apply 1 application topically daily.      . metFORMIN (GLUCOPHAGE) 1000 MG tablet Take 1,000 mg by mouth 2 (two) times daily with a meal.        . metoprolol (LOPRESSOR) 50 MG tablet Take 1/2 by mouth every AM and 1  every PM       . nystatin (MYCOSTATIN) cream Apply 1 application topically as needed.       . pantoprazole (PROTONIX) 40 MG tablet Take 40 mg by mouth daily.      . propafenone (RYTHMOL SR) 325 MG 12 hr capsule Take 1 capsule (325 mg total) by mouth 2 (two) times daily.  60 capsule  4  . simvastatin (ZOCOR) 40 MG tablet Take 40 mg by mouth daily.        . sitaGLIPtin (JANUVIA) 100 MG tablet Take 100 mg by mouth daily.        . ursodiol (ACTIGALL) 300 MG capsule       . warfarin (COUMADIN) 5 MG tablet TAKE AS DIRECTED BY COUMADIN CLINIC  90 tablet  1    Allergies  Allergen Reactions  . Capoten (Captopril) Anaphylaxis    Throat swells shut  . Ace Inhibitors   . Neomycin-Bacitracin Zn-Polymyx     Review of Systems negative except from HPI and PMH  Physical Exam BP 148/80  Pulse 75  Ht 5\' 2"  (1.575 m)  Wt 182 lb 6.4 oz (82.736 kg)  BMI 33.36 kg/m2 Well developed and well nourished in no acute distress HENT normal E scleral and icterus clear Neck Supple JVP  flat; carotids brisk and full Clear to ausculation Regular rate and rhythm, no murmurs gallops or rub Soft with active bowel sounds No clubbing cyanosis none Edema Alert and oriented, grossly normal motor and sensory function Skin Warm and Dry  Electrocardiogram demonstrates sinus rhythm at 70 Intervals 20/09/38 axis is 11   Assessment and  Plan

## 2012-01-09 ENCOUNTER — Ambulatory Visit (INDEPENDENT_AMBULATORY_CARE_PROVIDER_SITE_OTHER)
Admission: RE | Admit: 2012-01-09 | Discharge: 2012-01-09 | Disposition: A | Discharge status: Home / Self Care | Payer: Medicare Other | Source: Ambulatory Visit | Attending: Gastroenterology | Admitting: Gastroenterology

## 2012-01-09 ENCOUNTER — Telehealth: Payer: Self-pay | Admitting: Gastroenterology

## 2012-01-09 DIAGNOSIS — K429 Umbilical hernia without obstruction or gangrene: Secondary | ICD-10-CM

## 2012-01-09 DIAGNOSIS — K8020 Calculus of gallbladder without cholecystitis without obstruction: Secondary | ICD-10-CM

## 2012-01-09 DIAGNOSIS — D259 Leiomyoma of uterus, unspecified: Secondary | ICD-10-CM

## 2012-01-09 HISTORY — DX: Calculus of gallbladder without cholecystitis without obstruction: K80.20

## 2012-01-09 HISTORY — DX: Leiomyoma of uterus, unspecified: D25.9

## 2012-01-09 MED ORDER — IOHEXOL 300 MG/ML  SOLN
100.0000 mL | Freq: Once | INTRAMUSCULAR | Status: AC | PRN
  Administered 2012-01-09: 100 mL via INTRAVENOUS

## 2012-01-09 NOTE — Telephone Encounter (Signed)
Consulted with Sara Hancock at [facility] and instructed pt to restart the metformin on Monday, 01/12/12; pt stated understanding.

## 2012-01-13 LAB — MITOCHONDRIAL ANTIBODIES: Mitochondrial M2 Ab, IgG: 2.85 — ABNORMAL HIGH (ref <0.91)

## 2012-01-14 ENCOUNTER — Telehealth: Payer: Self-pay | Admitting: *Deleted

## 2012-01-14 DIAGNOSIS — R9389 Abnormal findings on diagnostic imaging of other specified body structures: Secondary | ICD-10-CM

## 2012-01-14 DIAGNOSIS — R935 Abnormal findings on diagnostic imaging of other abdominal regions, including retroperitoneum: Secondary | ICD-10-CM

## 2012-01-14 NOTE — Telephone Encounter (Signed)
No Thursdays. Pt stated understanding and I will call her after scheduling.

## 2012-01-14 NOTE — Telephone Encounter (Signed)
Message copied by Lance Morin on Wed Jan 14, 2012  4:31 PM ------      Message from: Sharlett Iles, DAVID R      Created: Tue Jan 13, 2012  8:20 AM       PET scan per large nodes

## 2012-01-14 NOTE — Telephone Encounter (Signed)
lmom for pt to call back

## 2012-01-17 ENCOUNTER — Other Ambulatory Visit: Payer: Self-pay | Admitting: Gastroenterology

## 2012-01-23 NOTE — Telephone Encounter (Signed)
Scheduled Pet scan with Vivien Rota for 02/03/12 arrive at 11:15am for 11:30am scan at [facility]. Hold morning dose of Insulin/Levimir!!! NPO 6 hours prior to scan. Absolutely no sugar- gum, mints, flavored water etc!!  Informed pt of scan appt and that I mailed her instructions; pt stated understanding.

## 2012-02-02 ENCOUNTER — Telehealth: Payer: Self-pay | Admitting: Gastroenterology

## 2012-02-02 NOTE — Telephone Encounter (Addendum)
Pt was worried he sugar will drop if she doesn't eat something in the am prior to her PET Scan. We discussed whether to skip the PM dose of insulin and finally I called the PET Scan number. The tech suggested pt take the pm dose of insulin, eat a light breakfast around 4:30am and nothing else after 5am; pt stated understanding.

## 2012-02-02 NOTE — Telephone Encounter (Signed)
Line busy

## 2012-02-03 ENCOUNTER — Encounter (HOSPITAL_COMMUNITY)
Admission: RE | Admit: 2012-02-03 | Discharge: 2012-02-03 | Disposition: A | Discharge status: Home / Self Care | Payer: Medicare Other | Source: Ambulatory Visit | Attending: Gastroenterology | Admitting: Gastroenterology

## 2012-02-03 DIAGNOSIS — R9389 Abnormal findings on diagnostic imaging of other specified body structures: Secondary | ICD-10-CM | POA: Insufficient documentation

## 2012-02-03 DIAGNOSIS — R935 Abnormal findings on diagnostic imaging of other abdominal regions, including retroperitoneum: Secondary | ICD-10-CM | POA: Insufficient documentation

## 2012-02-03 LAB — GLUCOSE, CAPILLARY: Glucose-Capillary: 203 mg/dL — ABNORMAL HIGH (ref 70–99)

## 2012-02-03 MED ORDER — FLUDEOXYGLUCOSE F - 18 (FDG) INJECTION
18.9000 | Freq: Once | INTRAVENOUS | Status: AC | PRN
  Administered 2012-02-03: 18.9 via INTRAVENOUS

## 2012-02-11 ENCOUNTER — Other Ambulatory Visit: Payer: Self-pay | Admitting: Gastroenterology

## 2012-02-18 ENCOUNTER — Ambulatory Visit (INDEPENDENT_AMBULATORY_CARE_PROVIDER_SITE_OTHER): Payer: Medicare Other | Admitting: Pharmacist

## 2012-02-18 DIAGNOSIS — I4891 Unspecified atrial fibrillation: Secondary | ICD-10-CM

## 2012-02-18 DIAGNOSIS — Z7901 Long term (current) use of anticoagulants: Secondary | ICD-10-CM

## 2012-02-18 LAB — POCT INR: INR: 2.2

## 2012-02-27 ENCOUNTER — Encounter: Payer: Self-pay | Admitting: *Deleted

## 2012-03-02 ENCOUNTER — Other Ambulatory Visit (INDEPENDENT_AMBULATORY_CARE_PROVIDER_SITE_OTHER): Payer: Medicare Other

## 2012-03-02 ENCOUNTER — Encounter: Payer: Self-pay | Admitting: Gastroenterology

## 2012-03-02 ENCOUNTER — Ambulatory Visit (INDEPENDENT_AMBULATORY_CARE_PROVIDER_SITE_OTHER): Payer: Medicare Other | Admitting: Gastroenterology

## 2012-03-02 VITALS — BP 136/80 | HR 68 | Ht 62.0 in | Wt 188.1 lb

## 2012-03-02 DIAGNOSIS — K7460 Unspecified cirrhosis of liver: Secondary | ICD-10-CM

## 2012-03-02 DIAGNOSIS — K439 Ventral hernia without obstruction or gangrene: Secondary | ICD-10-CM

## 2012-03-02 DIAGNOSIS — K2950 Unspecified chronic gastritis without bleeding: Secondary | ICD-10-CM

## 2012-03-02 DIAGNOSIS — D509 Iron deficiency anemia, unspecified: Secondary | ICD-10-CM

## 2012-03-02 DIAGNOSIS — K745 Biliary cirrhosis, unspecified: Secondary | ICD-10-CM

## 2012-03-02 DIAGNOSIS — R1013 Epigastric pain: Secondary | ICD-10-CM

## 2012-03-02 DIAGNOSIS — G8929 Other chronic pain: Secondary | ICD-10-CM

## 2012-03-02 DIAGNOSIS — K2940 Chronic atrophic gastritis without bleeding: Secondary | ICD-10-CM

## 2012-03-02 DIAGNOSIS — K743 Primary biliary cirrhosis: Secondary | ICD-10-CM

## 2012-03-02 DIAGNOSIS — Z86010 Personal history of colonic polyps: Secondary | ICD-10-CM

## 2012-03-02 LAB — CBC WITH DIFFERENTIAL/PLATELET
Basophils Absolute: 0 10*3/uL (ref 0.0–0.1)
Basophils Relative: 0.3 % (ref 0.0–3.0)
Eosinophils Absolute: 0 10*3/uL (ref 0.0–0.7)
Eosinophils Relative: 0.7 % (ref 0.0–5.0)
HCT: 34.6 % — ABNORMAL LOW (ref 36.0–46.0)
Hemoglobin: 11.2 g/dL — ABNORMAL LOW (ref 12.0–15.0)
Lymphocytes Relative: 26.4 % (ref 12.0–46.0)
Lymphs Abs: 1.9 10*3/uL (ref 0.7–4.0)
MCHC: 32.4 g/dL (ref 30.0–36.0)
MCV: 93.3 fl (ref 78.0–100.0)
Monocytes Absolute: 0.6 10*3/uL (ref 0.1–1.0)
Monocytes Relative: 8.4 % (ref 3.0–12.0)
Neutro Abs: 4.7 10*3/uL (ref 1.4–7.7)
Neutrophils Relative %: 64.2 % (ref 43.0–77.0)
Platelets: 262 10*3/uL (ref 150.0–400.0)
RBC: 3.71 Mil/uL — ABNORMAL LOW (ref 3.87–5.11)
RDW: 14.5 % (ref 11.5–14.6)
WBC: 7.3 10*3/uL (ref 4.5–10.5)

## 2012-03-02 LAB — IRON: Iron: 64 ug/dL (ref 42–145)

## 2012-03-02 LAB — AMMONIA: Ammonia: 9 umol/L — ABNORMAL LOW (ref 11–35)

## 2012-03-02 NOTE — Progress Notes (Signed)
This is a somewhat complicated 77 year old Caucasian female with insulin-dependent diabetes, atrial fibrillation, chronic anticoagulation, hypertension, and primary biliary cirrhosis treated for many years with Actigall 30 mg a day. She recently has had a symptomatic ventral hernia which is given her recurrent epigastric abdominal pain. CT scan recorder to evaluate her hernia and suggested possible abdominal lymphadenopathy.PET scan showed only reactive lymphadenopathy  without any changes to suggest lymphoma. Scans have consistently shown evidence of mild cirrhosis without evidence of portal hypertension. Liver function tests have been repeatedly normal. Her INR is approximately 2.2 on Coumadin. There is been no evidence of ascites, encephalopathy, or GI bleeding. Patient does not abuse alcohol, cigarettes, or NSAIDs. Her appetite is good her weight is stable. She is on chronic Protonix 40 mg a day for GERD.  She continues with mid abdominal pain but denies nausea vomiting or change in bowels. Review of her labs shows no other specific abnormalities.  Current Medications, Allergies, Past Medical History, Past Surgical History, Family History and Social History were reviewed in [employer] record.  Pertinent Review of Systems Negative   Physical Exam: I cannot appreciate stigmata of chronic liver disease. Blood pressure today 136/80, pulse 68 and regular, and weight 188 pounds the BMI of 34.41. She has a somewhat protuberant abdomen with a rather prominent periumbilical-ventral hernia with what appears to be palpable bowel present which is partially reducible. There is no definite tenderness, masses, and bowel sounds are nonobstructive. Mental status is normal. There is +1 peripheral edema present. I cannot appreciate any evidence of asterixis.    Assessment and Plan: Complicated patient with chronic but stable primary biliary cirrhosis over the last 25 years. She has no evidence  of liver failure, but I have ordered serum ammonia level. Actually she has a history of colon polyps and previous polypectomies, and is on oral iron therapy. Repeat CBC and iron studies also ordered. We have made her surgical appointment for evaluation because of her pain and a prominant ventral herniation. She obviously is not a good surgical candidate, but could possibly do well with laparoscopic surgery. I reviewed her multiple x-ray findings and clinical diagnoses with the patient. Her primary care physician is Dr.Dana Theda Sers. I've asked continue her other medications as listed and reviewed. Endoscopy in August of 2012 showed no evidence of esophageal varices, but did she arose have gastritis felt secondary to NSAID use, and she is on regular PPI therapy at this time.. I do not think liver biopsy would be beneficial at this time.  Please copy Dr. Alwyn Pea At [facility] and Dr. [name]. Encounter Diagnoses  Name Primary?  . Ventral hernia Yes  . Cirrhosis

## 2012-03-02 NOTE — Patient Instructions (Addendum)
Your physician has requested that you go to the basement for the following lab work before leaving today: Ammonia, CBC, Ferritin, Iron You have been scheduled for an appointment with Dr Johney Maine at [facility]. Your appointment is on 03/22/12 at 2:30 pm. Please arrive at 2:00 pm for registration. Make certain to bring a list of current medications, including any over the counter medications or vitamins. Also bring your co-pay if you have one as well as your insurance cards. [facility] is located at [address]. Should you need to reschedule your appointment, please contact them at [phone]. CC: Dr [name], Dr Johney Maine

## 2012-03-04 LAB — FERRITIN: Ferritin: 20.3 ng/mL (ref 10.0–291.0)

## 2012-03-18 ENCOUNTER — Other Ambulatory Visit: Payer: Self-pay | Admitting: Dermatology

## 2012-03-22 ENCOUNTER — Ambulatory Visit (INDEPENDENT_AMBULATORY_CARE_PROVIDER_SITE_OTHER): Payer: Medicare Other | Admitting: Surgery

## 2012-03-22 ENCOUNTER — Encounter (INDEPENDENT_AMBULATORY_CARE_PROVIDER_SITE_OTHER): Payer: Self-pay | Admitting: Surgery

## 2012-03-22 VITALS — BP 152/66 | HR 76 | Temp 97.1°F | Resp 16 | Ht 62.0 in | Wt 182.8 lb

## 2012-03-22 DIAGNOSIS — K436 Other and unspecified ventral hernia with obstruction, without gangrene: Secondary | ICD-10-CM

## 2012-03-22 DIAGNOSIS — K449 Diaphragmatic hernia without obstruction or gangrene: Secondary | ICD-10-CM

## 2012-03-22 DIAGNOSIS — E669 Obesity, unspecified: Secondary | ICD-10-CM

## 2012-03-22 DIAGNOSIS — Z7901 Long term (current) use of anticoagulants: Secondary | ICD-10-CM

## 2012-03-22 HISTORY — DX: Diaphragmatic hernia without obstruction or gangrene: K44.9

## 2012-03-22 HISTORY — DX: Obesity, unspecified: E66.9

## 2012-03-22 NOTE — Patient Instructions (Addendum)
See the Handout(s) we gave you.  Consider surgery.  Please call our office at [phone] if you wish to schedule surgery or if you have further questions / concerns.   Hernia Repair with Laparoscope A hernia occurs when an internal organ pushes out through a weak spot in the belly (abdominal) wall muscles. Hernias most commonly occur in the groin and around the navel. Hernias can also occur through a cut by the surgeon (incision) after an abdominal operation. A hernia may be caused by:  Lifting heavy objects.  Prolonged coughing.  Straining to move your bowels. Hernias can often be pushed back into place (reduced). Most hernias tend to get worse over time. Problems occur when abdominal contents get stuck in the opening and the blood supply is blocked or impaired (incarcerated hernia). Because of these risks, you require surgery to repair the hernia. Your hernia will be repaired using a laparoscope. Laparoscopic surgery is a type of minimally invasive surgery. It does not involve making a typical surgical cut (incision) in the skin. A laparoscope is a telescope-like rod and lens system. It is usually connected to a video camera and a light source so your caregiver can clearly see the operative area. The instruments are inserted through  to  inch (5 mm or 10 mm) openings in the skin at specific locations. A working and viewing space is created by blowing a small amount of carbon dioxide gas into the abdominal cavity. The abdomen is essentially blown up like a balloon (insufflated). This elevates the abdominal wall above the internal organs like a dome. The carbon dioxide gas is common to the human body and can be absorbed by tissue and removed by the respiratory system. Once the repair is completed, the small incisions will be closed with either stitches (sutures) or staples (just like a paper stapler only this staple holds the skin together). LET YOUR CAREGIVERS KNOW  ABOUT:  Allergies.  Medications taken including herbs, eye drops, over the counter medications, and creams.  Use of steroids (by mouth or creams).  Previous problems with anesthetics or Novocaine.  Possibility of pregnancy, if this applies.  History of blood clots (thrombophlebitis).  History of bleeding or blood problems.  Previous surgery.  Other health problems. BEFORE THE PROCEDURE  Laparoscopy can be done either in a hospital or out-patient clinic. You may be given a mild sedative to help you relax before the procedure. Once in the operating room, you will be given a general anesthesia to make you sleep (unless you and your caregiver choose a different anesthetic).  AFTER THE PROCEDURE  After the procedure you will be watched in a recovery area. Depending on what type of hernia was repaired, you might be admitted to the hospital or you might go home the same day. With this procedure you may have less pain and scarring. This usually results in a quicker recovery and less risk of infection. HOME CARE INSTRUCTIONS   Bed rest is not required. You may continue your normal activities but avoid heavy lifting (more than 10 pounds) or straining.  Cough gently. If you are a smoker it is best to stop, as even the best hernia repair can break down with the continual strain of coughing.  Avoid driving until given the OK by your surgeon.  There are no dietary restrictions unless given otherwise.  TAKE ALL MEDICATIONS AS DIRECTED.  Only take over-the-counter or prescription medicines for pain, discomfort, or fever as directed by your caregiver. [place]  IF:   There is increasing abdominal pain or pain in your incisions.  There is more bleeding from incisions, other than minimal spotting.  You feel light headed or faint.  You develop an unexplained fever, chills, and/or an oral temperature above 102 F (38.9 C).  You have redness, swelling, or increasing pain in the  wound.  Pus coming from wound.  A foul smell coming from the wound or dressings. SEEK IMMEDIATE MEDICAL CARE IF:   You develop a rash.  You have difficulty breathing.  You have any allergic problems. MAKE SURE YOU:   Understand these instructions.  Will watch your condition.  Will get help right away if you are not doing well or get worse. Document Released: 04/28/2005 Document Revised: 07/21/2011 Document Reviewed: 03/28/2009 [facility] Patient Information 2013 [place].

## 2012-03-22 NOTE — Progress Notes (Signed)
Subjective:     Patient ID: Sara Hancock, female   DOB: 12/09/1934, 77 y.o.   MRN: [id number]  HPI  Sara Hancock [id number]  21-Dec-1934 [id number]  Patient Care Team: [name] as PCP - General (Family Medicine) [name], MD as Consulting Physician (Gastroenterology) [name], MD as Consulting Physician (Cardiology)  This patient is a 77 y.o.female who presents today for surgical evaluation at the request of Dr. Sharlett Iles.   Reason for visit: Enlarging mass around bellybutton.  Concern for hernia.  Pleasant obese female with a long-standing history of cirrhosis.  Has noticed an enlarging lump around her bellybutton.  Occasionally gives her pain and discomfort.  CT scan confirmed periumbilical ventral hernia incarcerated with fat.   She is chronically anticoagulated on warfarin for atrial fibrillation.  She is followed by Dr. Sharlett Iles with gastroenterology for her cirrhosis and other GI issues.  Has a daily bowel movement.  No episodes of nausea or vomiting.  Weight has been stable.  No fevers, chills, sweats.  No difficulty with urination.  No history of urinary tract infections.  No history of MRSA or other skin infections.  Can walk about 10 minutes before her back pain makes her stop.  No heart attacks.  Diabetes with hemoglobin A1c around seven.  She had a tubal ligation but no other surgeries of her abdomen.  She has not had episodes of encephalopathy, ascites, liver failure, uncontrolled bleeding.  Because of the enlarging mass suspicious for a hernia, she was sent to [place] for surgical evaluation.  Patient Active Problem List  Diagnosis  . DIABETES MELLITUS  . HYPERCHOLESTEROLEMIA  . ANEMIA, NORMOCYTIC  . HYPERTENSION  . AV BLOCK, 1ST DEGREE  . ATRIAL FIBRILLATION, PAROXYSMAL  . BRADYCARDIA  . DIVERTICULOSIS, COLON, WITH HEMORRHAGE  . BILIARY CIRRHOSIS, PRIMARY  . GALLSTONES  . COLONIC POLYPS, HX OF  . Encounter for long-term (current) use of anticoagulants  .  Incarcerated ventral hernia, periumbilical  . Iron deficiency anemia, unspecified  . Anticoagulant long-term use  . Benign neoplasm of colon  . Gastritis, erosive chronic  . Esophageal reflux  . Paraesophageal hiatal hernia, 3cm by EGD  . Obesity (BMI 30-39.9)    Past Medical History  Diagnosis Date  . Type II or unspecified type diabetes mellitus without mention of complication, not stated as uncontrolled   . Pure hypercholesterolemia   . HTN (hypertension)   . Anemia   . Biliary cirrhosis   . Diverticulosis of colon with hemorrhage 06/08/2000  . Esophageal stenosis 01/08/11  . Hiatal hernia 01/08/11  . Esophagitis 01/08/11  . Hyperplastic colon polyp 06/08/2000, 07/31/2003, 01/08/11  . Hx of adenomatous colonic polyps 01/08/11  . GERD (gastroesophageal reflux disease)   . Osteopenia   . Cholelithiasis 01/09/12  . Uterine fibroid 01/09/12    Past Surgical History  Procedure Date  . Thyroidectomy, partial 1980  . Tubal ligation 1972  . Breast cyst excision     left  . Colonoscopy   . Polypectomy     History   Social History  . Marital Status: Married    Spouse Name: N/A    Number of Children: 2  . Years of Education: N/A   Occupational History  . retired    Social History Main Topics  . Smoking status: Never Smoker   . Smokeless tobacco: Never Used  . Alcohol Use: No  . Drug Use: No  . Sexually Active: Not on file   Other Topics Concern  . Not  on file   Social History Narrative  . No narrative on file    Family History  Problem Relation Age of Onset  . Diabetes Sister     x 2  . Diabetes Brother   . Colon cancer Neg Hx   . Heart attack Father   . Stroke Mother     Current Outpatient Prescriptions  Medication Sig Dispense Refill  . FeFum-FePo-FA-B Cmp-C-Zn-Mn-Cu (SE-TAN PLUS) 162-115.2-1 MG CAPS Take 1 capsule by mouth daily.  30 capsule  4  . FeFum-FePo-FA-B Cmp-C-Zn-Mn-Cu (SE-TAN PLUS) 162-115.2-1 MG CAPS TAKE ONE CAPSULE BY MOUTH EVERY DAY  30  capsule  2  . fexofenadine (ALLEGRA) 180 MG tablet Take 180 mg by mouth daily.        . furosemide (LASIX) 40 MG tablet Take 60 mg by mouth daily.       . hydrALAZINE (APRESOLINE) 25 MG tablet Take 25 mg by mouth 2 (two) times daily.      . insulin detemir (LEVEMIR) 100 UNIT/ML injection Inject 16 Units into the skin at bedtime.       . irbesartan (AVAPRO) 150 MG tablet Take 300 mg by mouth daily.       Marland Kitchen ketoconazole (NIZORAL) 2 % cream Apply 1 application topically daily.      . metFORMIN (GLUCOPHAGE) 1000 MG tablet Take 1,000 mg by mouth 2 (two) times daily with a meal.        . metoprolol (LOPRESSOR) 50 MG tablet Take 1/2 by mouth every AM and 1 every PM       . nystatin (MYCOSTATIN) cream Apply 1 application topically as needed.       . pantoprazole (PROTONIX) 40 MG tablet Take 40 mg by mouth daily.      . propafenone (RYTHMOL SR) 325 MG 12 hr capsule Take 1 capsule (325 mg total) by mouth 2 (two) times daily.  180 capsule  3  . simvastatin (ZOCOR) 40 MG tablet Take 40 mg by mouth daily.        . sitaGLIPtin (JANUVIA) 100 MG tablet Take 100 mg by mouth daily.        . ursodiol (ACTIGALL) 300 MG capsule       . warfarin (COUMADIN) 5 MG tablet TAKE AS DIRECTED BY COUMADIN CLINIC  90 tablet  1     Allergies  Allergen Reactions  . Capoten (Captopril) Anaphylaxis    Throat swells shut  . Ace Inhibitors   . Neomycin-Bacitracin Zn-Polymyx     BP 152/66  Pulse 76  Temp 97.1 F (36.2 C) (Temporal)  Resp 16  Ht 5\' 2"  (1.575 m)  Wt 182 lb 12.8 oz (82.918 kg)  BMI 33.43 kg/m2  No results found.   Review of Systems     Objective:   Physical Exam  Constitutional: She is oriented to person, place, and time. She appears well-developed and well-nourished. No distress.  HENT:  Head: Normocephalic.  Mouth/Throat: Oropharynx is clear and moist. No oropharyngeal exudate.  Eyes: Conjunctivae normal, EOM and lids are normal. Pupils are equal, round, and reactive to light. Right  conjunctiva is not injected. Left conjunctiva is not injected. No scleral icterus.  Neck: Normal range of motion. Neck supple. No tracheal deviation present.  Cardiovascular: Normal rate, regular rhythm and intact distal pulses.   Pulmonary/Chest: Effort normal and breath sounds normal. No respiratory distress. She exhibits no tenderness.  Abdominal: Soft. She exhibits no shifting dullness, no distension, no pulsatile liver, no fluid wave and no ascites.  There is tenderness in the periumbilical area. There is no rebound, no guarding and no CVA tenderness. A hernia is present. Hernia confirmed positive in the ventral area. Hernia confirmed negative in the right inguinal area and confirmed negative in the left inguinal area.         Obese but soft  Genitourinary: No vaginal discharge found.  Musculoskeletal: Normal range of motion. She exhibits no tenderness.  Lymphadenopathy:    She has no cervical adenopathy.       Right: No inguinal adenopathy present.       Left: No inguinal adenopathy present.  Neurological: She is alert and oriented to person, place, and time. No cranial nerve deficit. She exhibits normal muscle tone. Coordination normal.  Skin: Skin is warm and dry. No rash noted. She is not diaphoretic. No erythema.       No telangiectasias/caput medusae  Psychiatric: She has a normal mood and affect. Her behavior is normal. Judgment and thought content normal.       Assessment:     Incarcerated periumbilical ventral hernia.  10 cm mass.  CT scan consistent with 4-5 cm defect    Plan:     Given the fact that has gotten larger, is incarcerated, and is uncomfortable; I think she would benefit from repair.  While she does have cirrhosis, there is no strong evidence of portal hypertension or hepatic insufficiency.  Her disease is stable.  She is a reasonable risk to repair.  She is leaning more toward surgery but wants to wait until after the holiday season.  I definitely would want  cardiac clearance to make sure that anticoagulation issues perioperatively are dressed.  Also to make sure her cardiopulmonary status is good enough to tolerate a laparoscopic repair with mesh:  The anatomy & physiology of the abdominal wall was discussed.  The pathophysiology of hernias was discussed.  Natural history risks without surgery including progeressive enlargement, pain, incarceration & strangulation was discussed.   Contributors to complications such as smoking, obesity, diabetes, prior surgery, etc were discussed.   I feel the risks of no intervention will lead to serious problems that outweigh the operative risks; therefore, I recommended surgery to reduce and repair the hernia.  I explained laparoscopic techniques with possible need for an open approach.  I noted the probable use of mesh to patch and/or buttress the hernia repair  Risks such as bleeding, infection, abscess, need for further treatment, heart attack, death, and other risks were discussed.  I noted a good likelihood this will help address the problem.   Goals of post-operative recovery were discussed as well.  Possibility that this will not correct all symptoms was explained.  I stressed the importance of low-impact activity, aggressive pain control, avoiding constipation, & not pushing through pain to minimize risk of post-operative chronic pain or injury. Possibility of reherniation especially with smoking, obesity, diabetes, immunosuppression, and other health conditions was discussed.  We will work to minimize complications.     An educational handout further explaining the pathology & treatment options was given as well.  Questions were answered.  The patient expresses understanding & wishes to proceed with surgery.

## 2012-03-22 NOTE — Addendum Note (Signed)
Addended by: Adin Hector on: 03/22/2012 05:54 PM   Modules accepted: Orders

## 2012-03-29 ENCOUNTER — Other Ambulatory Visit (INDEPENDENT_AMBULATORY_CARE_PROVIDER_SITE_OTHER): Payer: Self-pay | Admitting: Surgery

## 2012-03-29 ENCOUNTER — Other Ambulatory Visit: Payer: Self-pay | Admitting: Internal Medicine

## 2012-03-29 DIAGNOSIS — I10 Essential (primary) hypertension: Secondary | ICD-10-CM

## 2012-03-29 DIAGNOSIS — I4891 Unspecified atrial fibrillation: Secondary | ICD-10-CM

## 2012-03-29 MED ORDER — PROPAFENONE HCL ER 325 MG PO CP12: 325.0000 mg | ORAL_CAPSULE | Freq: Two times a day (BID) | ORAL | Status: DC

## 2012-03-29 NOTE — Telephone Encounter (Signed)
Pharm calling for refill/lg

## 2012-04-01 ENCOUNTER — Ambulatory Visit (INDEPENDENT_AMBULATORY_CARE_PROVIDER_SITE_OTHER): Payer: Medicare Other | Admitting: *Deleted

## 2012-04-01 DIAGNOSIS — Z7901 Long term (current) use of anticoagulants: Secondary | ICD-10-CM

## 2012-04-01 DIAGNOSIS — I4891 Unspecified atrial fibrillation: Secondary | ICD-10-CM

## 2012-04-01 LAB — POCT INR: INR: 2.3

## 2012-04-12 ENCOUNTER — Telehealth: Payer: Self-pay | Admitting: Internal Medicine

## 2012-04-12 NOTE — Telephone Encounter (Signed)
New Problem:    Patient called in returning a call that she missed.  Please call back.

## 2012-04-12 NOTE — Telephone Encounter (Signed)
Appt made

## 2012-04-15 ENCOUNTER — Ambulatory Visit (INDEPENDENT_AMBULATORY_CARE_PROVIDER_SITE_OTHER): Payer: Medicare Other | Admitting: Nurse Practitioner

## 2012-04-15 ENCOUNTER — Encounter: Payer: Self-pay | Admitting: Nurse Practitioner

## 2012-04-15 VITALS — BP 148/90 | HR 67 | Ht 62.5 in | Wt 183.4 lb

## 2012-04-15 DIAGNOSIS — Z01818 Encounter for other preprocedural examination: Secondary | ICD-10-CM

## 2012-04-15 NOTE — Progress Notes (Signed)
Darlean Heinicke Lahmann Date of Birth: 1934/11/21 Medical Record [id number]  History of Present Illness: Ms. Yeung is seen back today for a pre op visit. She is seen for Dr. Caryl Comes. She has PAF, DM, HTN, obesity, HLD, anemia, diverticulosis, hiatal hernia and is on chronic coumadin. Her last visit here was in August. She was felt to be doing ok.   She comes in today. She is here alone. She is planning on having hernia surgery with Dr. Johney Maine under general anesthesia in the near future. She says she is doing ok. She does not have chest pain. She does endorse some DOE. Not lightheaded or dizzy. Tries to be active but is limited by her back. Sugars have been running a little high. She says her blood pressure at home is much better than here. She thinks she has white coat syndrome. Her CV risk factors include positive family history, HTN, HLD, DM and obesity. She feels like her rhythm has been fine.   Current Outpatient Prescriptions on File Prior to Visit  Medication Sig Dispense Refill  . FeFum-FePo-FA-B Cmp-C-Zn-Mn-Cu (SE-TAN PLUS) 162-115.2-1 MG CAPS TAKE ONE CAPSULE BY MOUTH EVERY DAY  30 capsule  2  . fexofenadine (ALLEGRA) 180 MG tablet Take 180 mg by mouth daily.        . furosemide (LASIX) 40 MG tablet Take 60 mg by mouth daily.       . hydrALAZINE (APRESOLINE) 25 MG tablet Take 25 mg by mouth 2 (two) times daily.      . insulin detemir (LEVEMIR) 100 UNIT/ML injection Inject 16 Units into the skin at bedtime.       . irbesartan (AVAPRO) 150 MG tablet Take 300 mg by mouth daily.       Marland Kitchen ketoconazole (NIZORAL) 2 % cream Apply 1 application topically daily.      . metFORMIN (GLUCOPHAGE) 1000 MG tablet Take 1,000 mg by mouth 2 (two) times daily with a meal.        . metoprolol (LOPRESSOR) 50 MG tablet Take 1/2 by mouth every AM and 1 every PM       . nystatin (MYCOSTATIN) cream Apply 1 application topically as needed.       . pantoprazole (PROTONIX) 40 MG tablet Take 40 mg by mouth daily.        . propafenone (RYTHMOL SR) 325 MG 12 hr capsule Take 1 capsule (325 mg total) by mouth 2 (two) times daily.  180 capsule  3  . simvastatin (ZOCOR) 40 MG tablet Take 40 mg by mouth daily.        . sitaGLIPtin (JANUVIA) 100 MG tablet Take 100 mg by mouth daily.        . ursodiol (ACTIGALL) 300 MG capsule Take 300 mg by mouth daily.       Marland Kitchen warfarin (COUMADIN) 5 MG tablet TAKE AS DIRECTED BY COUMADIN CLINIC  90 tablet  1    Allergies  Allergen Reactions  . Capoten (Captopril) Anaphylaxis    Throat swells shut  . Ace Inhibitors   . Neomycin-Bacitracin Zn-Polymyx     Past Medical History  Diagnosis Date  . Type II or unspecified type diabetes mellitus without mention of complication, not stated as uncontrolled   . Pure hypercholesterolemia   . HTN (hypertension)   . Anemia   . Biliary cirrhosis   . Diverticulosis of colon with hemorrhage 06/08/2000  . Esophageal stenosis 01/08/11  . Hiatal hernia 01/08/11  . Esophagitis 01/08/11  . Hyperplastic colon polyp  06/08/2000, 07/31/2003, 01/08/11  . Hx of adenomatous colonic polyps 01/08/11  . GERD (gastroesophageal reflux disease)   . Osteopenia   . Cholelithiasis 01/09/12  . Uterine fibroid 01/09/12  . PAF (paroxysmal atrial fibrillation)     occurring in the setting of bradycardia and mild first degree AV block  . High risk medication use     on Rythmol  . Obesity     Past Surgical History  Procedure Date  . Thyroidectomy, partial 1980  . Tubal ligation 1972  . Breast cyst excision     left  . Colonoscopy   . Polypectomy     History  Smoking status  . Never Smoker   Smokeless tobacco  . Never Used    History  Alcohol Use No    Family History  Problem Relation Age of Onset  . Diabetes Sister     x 2  . Diabetes Brother   . Colon cancer Neg Hx   . Heart attack Father   . Stroke Mother     Review of Systems: The review of systems is per the HPI.  All other systems were reviewed and are negative.  Physical Exam: BP  148/90  Pulse 67  Ht 5' 2.5" (1.588 m)  Wt 183 lb 6.4 oz (83.19 kg)  BMI 33.01 kg/m2 Patient is very pleasant and in no acute distress. She is obese. Skin is warm and dry. Color is normal.  HEENT is unremarkable. Normocephalic/atraumatic. PERRL. Sclera are nonicteric. Neck is supple. No masses. No JVD. Lungs are clear. Cardiac exam shows a regular rate and rhythm. Abdomen is soft. Extremities are without edema. Gait and ROM are intact. No gross neurologic deficits noted.  LABORATORY DATA: EKG today shows sinus rhythm.   Lab Results  Component Value Date   WBC 7.3 03/02/2012   HGB 11.2* 03/02/2012   HCT 34.6* 03/02/2012   PLT 262.0 03/02/2012   GLUCOSE 146* 12/30/2011   ALT 26 12/30/2011   AST 28 12/30/2011   NA 138 12/30/2011   K 4.3 12/30/2011   CL 102 12/30/2011   CREATININE 0.7 12/30/2011   BUN 18 12/30/2011   CO2 29 12/30/2011   TSH 1.30 12/30/2011   INR 2.3 04/01/2012     Assessment / Plan: 1. Pre op clearance for hernia surgery - she should be an acceptable candidate. She does have some DOE which I suspect is more weight related but in light of her multiple CV risk factors I would like to update her stress test. She has not had stress testing in many years.   2. HTN - she says her blood pressure at home has been doing well. I have asked her to continue to monitor. For now, no change in medicines.   3. PAF - on Rythmol and staying in sinus rhythm.   We will update her stress test. No change in her medicines. See Dr. Caryl Comes back next August, unless her study is abnormal.   Patient is agreeable to this plan and will call if any problems develop in the interim.

## 2012-04-15 NOTE — Patient Instructions (Addendum)
We will arrange for a stress test (Lexiscan)  Stay on your current medicines.  I will send Dr. Johney Maine a note about you  Call the [place] office at [phone] if you have any questions, problems or concerns.

## 2012-04-20 ENCOUNTER — Ambulatory Visit (HOSPITAL_COMMUNITY): Payer: Medicare Other | Attending: Internal Medicine | Admitting: Radiology

## 2012-04-20 VITALS — BP 148/74 | Ht 62.5 in | Wt 179.0 lb

## 2012-04-20 DIAGNOSIS — I10 Essential (primary) hypertension: Secondary | ICD-10-CM | POA: Insufficient documentation

## 2012-04-20 DIAGNOSIS — Z8249 Family history of ischemic heart disease and other diseases of the circulatory system: Secondary | ICD-10-CM | POA: Insufficient documentation

## 2012-04-20 DIAGNOSIS — Z01818 Encounter for other preprocedural examination: Secondary | ICD-10-CM

## 2012-04-20 DIAGNOSIS — R9431 Abnormal electrocardiogram [ECG] [EKG]: Secondary | ICD-10-CM

## 2012-04-20 DIAGNOSIS — E785 Hyperlipidemia, unspecified: Secondary | ICD-10-CM | POA: Insufficient documentation

## 2012-04-20 DIAGNOSIS — I4891 Unspecified atrial fibrillation: Secondary | ICD-10-CM | POA: Insufficient documentation

## 2012-04-20 DIAGNOSIS — Z01810 Encounter for preprocedural cardiovascular examination: Secondary | ICD-10-CM | POA: Insufficient documentation

## 2012-04-20 DIAGNOSIS — R5381 Other malaise: Secondary | ICD-10-CM | POA: Insufficient documentation

## 2012-04-20 MED ORDER — TECHNETIUM TC 99M SESTAMIBI GENERIC - CARDIOLITE
30.7000 | Freq: Once | INTRAVENOUS | Status: AC | PRN
  Administered 2012-04-20: 30.7 via INTRAVENOUS

## 2012-04-20 MED ORDER — REGADENOSON 0.4 MG/5ML IV SOLN
0.4000 mg | Freq: Once | INTRAVENOUS | Status: AC
  Administered 2012-04-20: 0.4 mg via INTRAVENOUS

## 2012-04-20 MED ORDER — TECHNETIUM TC 99M SESTAMIBI GENERIC - CARDIOLITE
9.6000 | Freq: Once | INTRAVENOUS | Status: AC | PRN
  Administered 2012-04-20: 10 via INTRAVENOUS

## 2012-04-20 NOTE — Progress Notes (Signed)
[facility] [id number] [facility] [place] [address] [phone]  Cardiology Nuclear Med Study  Sara Hancock is a 77 y.o. female     MRN : [id number]     DOB: 10/26/1934  Procedure Date: 04/20/2012  Nuclear Med Background Indication for Stress Test:  Evaluation for Ischemia, and Surgical Clearance for  Umbilical Hernia Repair on 05-18-2012 by Dr. Michael Boston History:  AFIB, '07 GXT: NL(suboptimal test) Cardiac Risk Factors: Family History - CAD, Hypertension and Lipids  Symptoms:  Fatigue   Nuclear Pre-Procedure Caffeine/Decaff Intake:  None > 12 hrs NPO After: 11:30pm   Lungs:  clear O2 Sat: 96% on room air. IV 0.9% NS with Angio Cath:  20g  IV Site: R Antecubital x 1, tolerated well IV Started by:  Irven Baltimore, RN  Chest Size (in):  38 Cup Size: D  Height: 5' 2.5" (1.588 m)  Weight:  179 lb (81.194 kg)  BMI:  Body mass index is 32.22 kg/(m^2). Tech Comments: Took Lopressor and Rythmol this am. FBS was 146 at 8:00am per patient, no insulin or diabetic po medication today. Patient took 1/2 dose Levemir insulin last night.    Nuclear Med Study 1 or 2 day study: 1 day  Stress Test Type:  Lexiscan  Reading MD: Kirk Ruths, MD  Order Authorizing Provider:  Virl Axe, MD, and Truitt Merle, NP  Resting Radionuclide: Technetium 42m Sestamibi  Resting Radionuclide Dose: 9.6 mCi   Stress Radionuclide:  Technetium 41m Sestamibi  Stress Radionuclide Dose: 30.7 mCi           Stress Protocol Rest HR: 57 Stress HR: 96  Rest BP: 148/74 Stress BP: 141/69  Exercise Time (min): n/a METS: n/a   Predicted Max HR: 145 bpm % Max HR: 66.21 bpm Rate Pressure Product: 14208   Dose of Adenosine (mg):  n/a Dose of Lexiscan: 0.4 mg  Dose of Atropine (mg): n/a Dose of Dobutamine: n/a mcg/kg/min (at max HR)  Stress Test Technologist: Perrin Maltese, EMT-P  Nuclear Technologist:  Charlton Amor, CNMT     Rest Procedure:   Myocardial perfusion imaging was performed at rest 45 minutes following the intravenous administration of Technetium 47m Sestamibi. Rest ECG: NSR, first degree AV block, nonspecific ST changes.  Stress Procedure:  The patient received IV Lexiscan 0.4 mg over 15-seconds.  Technetium 74m Sestamibi injected at 30-seconds.  There was (R) chest tightness, lt. Headed, sob, and weird feeling with Lexiscan.  Quantitative spect images were obtained after a 45 minute delay. Stress ECG: No significant ST segment change suggestive of ischemia.  QPS Raw Data Images:  Acquisition technically good; normal left ventricular size. Stress Images:  There is decreased uptake in the anterior wall. Rest Images:  There is decreased uptake in the anterior wall. Subtraction (SDS):  No evidence of ischemia. Transient Ischemic Dilatation (Normal <1.22):  0.89 Lung/Heart Ratio (Normal <0.45):  0.20  Quantitative Gated Spect Images QGS EDV:  81 ml QGS ESV:  16 ml  Impression Exercise Capacity:  Lexiscan with no exercise. BP Response:  Normal blood pressure response. Clinical Symptoms:  There is dyspnea. ECG Impression:  No significant ST segment change suggestive of ischemia. Comparison with Prior Nuclear Study: No images to compare  Overall Impression:  Normal stress nuclear study with a small, fixed, distal anterior defect consistent with soft tissue attenuation; no ischemia.  LV Ejection Fraction: 80%.  LV Wall Motion:  NL LV Function; NL Wall Motion  Kirk Ruths

## 2012-04-21 ENCOUNTER — Encounter (INDEPENDENT_AMBULATORY_CARE_PROVIDER_SITE_OTHER): Payer: Self-pay

## 2012-05-06 ENCOUNTER — Encounter (HOSPITAL_COMMUNITY): Payer: Self-pay | Admitting: Pharmacy Technician

## 2012-05-10 ENCOUNTER — Telehealth: Payer: Self-pay | Admitting: *Deleted

## 2012-05-10 ENCOUNTER — Encounter (HOSPITAL_COMMUNITY)
Admission: RE | Admit: 2012-05-10 | Discharge: 2012-05-10 | Disposition: A | Discharge status: Home / Self Care | Payer: Medicare Other | Source: Ambulatory Visit | Attending: Surgery | Admitting: Surgery

## 2012-05-10 ENCOUNTER — Ambulatory Visit (HOSPITAL_COMMUNITY)
Admission: RE | Admit: 2012-05-10 | Discharge: 2012-05-10 | Disposition: A | Discharge status: Home / Self Care | Payer: Medicare Other | Source: Ambulatory Visit | Attending: Surgery | Admitting: Surgery

## 2012-05-10 ENCOUNTER — Encounter (HOSPITAL_COMMUNITY): Payer: Self-pay

## 2012-05-10 DIAGNOSIS — K7460 Unspecified cirrhosis of liver: Secondary | ICD-10-CM | POA: Insufficient documentation

## 2012-05-10 DIAGNOSIS — E119 Type 2 diabetes mellitus without complications: Secondary | ICD-10-CM | POA: Insufficient documentation

## 2012-05-10 DIAGNOSIS — K439 Ventral hernia without obstruction or gangrene: Secondary | ICD-10-CM | POA: Insufficient documentation

## 2012-05-10 DIAGNOSIS — I10 Essential (primary) hypertension: Secondary | ICD-10-CM | POA: Insufficient documentation

## 2012-05-10 HISTORY — DX: Unspecified osteoarthritis, unspecified site: M19.90

## 2012-05-10 LAB — COMPREHENSIVE METABOLIC PANEL
ALT: 24 U/L (ref 0–35)
AST: 25 U/L (ref 0–37)
Albumin: 3.3 g/dL — ABNORMAL LOW (ref 3.5–5.2)
Alkaline Phosphatase: 181 U/L — ABNORMAL HIGH (ref 39–117)
BUN: 22 mg/dL (ref 6–23)
CO2: 29 mEq/L (ref 19–32)
Calcium: 9.1 mg/dL (ref 8.4–10.5)
Chloride: 97 mEq/L (ref 96–112)
Creatinine, Ser: 0.66 mg/dL (ref 0.50–1.10)
GFR calc Af Amer: >90 mL/min (ref >90)
GFR calc non Af Amer: 84 mL/min — ABNORMAL LOW (ref >90)
Glucose, Bld: 262 mg/dL — ABNORMAL HIGH (ref 70–99)
Potassium: 4.5 mEq/L (ref 3.5–5.1)
Sodium: 137 mEq/L (ref 135–145)
Total Bilirubin: 0.3 mg/dL (ref 0.3–1.2)
Total Protein: 7.7 g/dL (ref 6.0–8.3)

## 2012-05-10 LAB — SURGICAL PCR SCREEN
MRSA, PCR: NEGATIVE
Staphylococcus aureus: NEGATIVE

## 2012-05-10 LAB — CBC
HCT: 36.9 % (ref 36.0–46.0)
Hemoglobin: 12.1 g/dL (ref 12.0–15.0)
MCH: 30 pg (ref 26.0–34.0)
MCHC: 32.8 g/dL (ref 30.0–36.0)
MCV: 91.3 fL (ref 78.0–100.0)
Platelets: 331 10*3/uL (ref 150–400)
RBC: 4.04 MIL/uL (ref 3.87–5.11)
RDW: 13.6 % (ref 11.5–15.5)
WBC: 6.3 10*3/uL (ref 4.0–10.5)

## 2012-05-10 NOTE — Progress Notes (Signed)
Pt, Ptt order did not cross over in EPIC and was not done by lab. Will be done AM of surgery

## 2012-05-10 NOTE — Patient Instructions (Signed)
[facility]  05/10/2012                           YOUR PROCEDURE IS SCHEDULED ON:  05/18/12               PLEASE REPORT TO SHORT STAY CENTER AT :  10:00 AM               CALL THIS NUMBER IF ANY PROBLEMS THE DAY OF SURGERY :               [phone]                      REMEMBER:   Do not eat food or drink liquids AFTER MIDNIGHT    Take these medicines the morning of surgery with A SIP OF WATER:  ALLEGRA / HYDRALAZINE / METOPROLOL / TAKE 1/2 DOSE OF LEVEMIR (8 UNITS) THE EVENING BEFORE SURGERY   Do not wear jewelry, make-up   Do not wear lotions, powders, or perfumes.   Do not shave legs or underarms 12 hrs. before surgery (men may shave face)  Do not bring valuables to the hospital.  Contacts, dentures or bridgework may not be worn into surgery.  Leave suitcase in the car. After surgery it may be brought to your room.  For patients admitted to the hospital more than one night, checkout time is 11:00                          The day of discharge.   Patients discharged the day of surgery will not be allowed to drive home                             If going home same day of surgery, must have someone stay with you first                           24 hrs at home and arrange for some one to drive you home from hospital.    Special Instructions:   Please read over the following fact sheets that you were given:               1. MRSA  INFORMATION                      2. [place]               3. STOP ASPIRIN AND HERBAL PRODUCTS 5 DAYS PREOP                                                X_____________________________________________________________________        Failure to follow these instructions may result in cancellation of your surgery

## 2012-05-10 NOTE — Telephone Encounter (Signed)
Talked with pt and instructed that she may hold coumadin 5 days  before surgery on January 7th . This nurse called and spoke with Pamala Hurry at Dr Johney Maine office and she states she will call pt back if Dr  Johney Maine wants to change this but if she does not hear from him then last day to take coumadin is January 1st and she is to hold until surgery on the 7th. Pt states she may be admitted to hospital for couple days.Instructed to restart coumadin when instructed by physician if she should have day surgery and appt made for return visit to clinic on the 13th. Pt verbalized understanding of above instructions.

## 2012-05-10 NOTE — Telephone Encounter (Signed)
Message copied by Margretta Sidle on Mon May 10, 2012  2:26 PM ------      Message from: Burtis Junes      Created: Sat May 08, 2012 12:29 PM       Jerrilyn Cairo      ----- Message -----         From: Margretta Sidle, RN         Sent: 05/07/2012   3:24 PM           To: Burtis Junes, NP            Cecille Rubin      Mrs Hrabik called wanting to know when she is to start holding coumadin . I called and spoke with  Pamala Hurry at Dr Johney Maine office and she states he usually wants coumadin held 5 days before surgery .      Surgery is scheduled for 05/18/2012 so last day to take coumadin would be on 05/12/2012.       Please advise       Thanks      Elbert Ewings RN

## 2012-05-10 NOTE — Telephone Encounter (Signed)
Message copied by Margretta Sidle on Mon May 10, 2012  3:51 PM ------      Message from: Burtis Junes      Created: Sat May 08, 2012 12:29 PM       Jerrilyn Cairo      ----- Message -----         From: Margretta Sidle, RN         Sent: 05/07/2012   3:24 PM           To: Burtis Junes, NP            Cecille Rubin      Mrs Heffelfinger called wanting to know when she is to start holding coumadin . I called and spoke with  Pamala Hurry at Dr Johney Maine office and she states he usually wants coumadin held 5 days before surgery .      Surgery is scheduled for 05/18/2012 so last day to take coumadin would be on 05/12/2012.       Please advise       Thanks      Elbert Ewings RN

## 2012-05-10 NOTE — Progress Notes (Signed)
Abnormal CMET  Faxed to Dr. Johney Maine

## 2012-05-11 ENCOUNTER — Telehealth: Payer: Self-pay | Admitting: Internal Medicine

## 2012-05-11 NOTE — Telephone Encounter (Signed)
New problem:     Propafenone 125 mg capsule.    Warfarin  5 mg.    90 days supply for both medication

## 2012-05-12 ENCOUNTER — Other Ambulatory Visit: Payer: Self-pay | Admitting: Internal Medicine

## 2012-05-12 ENCOUNTER — Other Ambulatory Visit: Payer: Self-pay | Admitting: Gastroenterology

## 2012-05-14 ENCOUNTER — Other Ambulatory Visit: Payer: Self-pay | Admitting: *Deleted

## 2012-05-14 DIAGNOSIS — I4891 Unspecified atrial fibrillation: Secondary | ICD-10-CM

## 2012-05-14 DIAGNOSIS — I10 Essential (primary) hypertension: Secondary | ICD-10-CM

## 2012-05-14 MED ORDER — PROPAFENONE HCL ER 325 MG PO CP12: 325.0000 mg | ORAL_CAPSULE | Freq: Two times a day (BID) | ORAL | Status: DC

## 2012-05-14 NOTE — Telephone Encounter (Signed)
Follow-up:    Patient stated that her propafenone (RYTHMOL SR) 325 MG 12 hr capsule was still not filled for 90 days and would like it to be filled for 90 days.  Please call back.

## 2012-05-14 NOTE — Telephone Encounter (Signed)
Patient states [facility] said we Press photographer) only authorized her Rhythmol for 30 days when it states [place] phoned in a 90 day request. Rx was refaxed with statement "patient is requesting this to be filled as a 90 day supply"    Sara Hancock CMA

## 2012-05-18 ENCOUNTER — Encounter (HOSPITAL_COMMUNITY): Payer: Self-pay | Admitting: Anesthesiology

## 2012-05-18 ENCOUNTER — Ambulatory Visit (HOSPITAL_COMMUNITY): Payer: Medicare Other | Admitting: Anesthesiology

## 2012-05-18 ENCOUNTER — Encounter (HOSPITAL_COMMUNITY): Payer: Self-pay | Admitting: *Deleted

## 2012-05-18 ENCOUNTER — Encounter (HOSPITAL_COMMUNITY)
Admission: RE | Disposition: A | Discharge status: Home / Self Care | Payer: Self-pay | Source: Ambulatory Visit | Attending: Surgery

## 2012-05-18 ENCOUNTER — Ambulatory Visit (HOSPITAL_COMMUNITY)
Admission: RE | Admit: 2012-05-18 | Discharge: 2012-05-20 | Disposition: A | Discharge status: Home / Self Care | Payer: Medicare Other | Source: Ambulatory Visit | Attending: Surgery | Admitting: Surgery

## 2012-05-18 DIAGNOSIS — K743 Primary biliary cirrhosis: Secondary | ICD-10-CM | POA: Diagnosis present

## 2012-05-18 DIAGNOSIS — K219 Gastro-esophageal reflux disease without esophagitis: Secondary | ICD-10-CM | POA: Insufficient documentation

## 2012-05-18 DIAGNOSIS — M949 Disorder of cartilage, unspecified: Secondary | ICD-10-CM | POA: Insufficient documentation

## 2012-05-18 DIAGNOSIS — Z7901 Long term (current) use of anticoagulants: Secondary | ICD-10-CM

## 2012-05-18 DIAGNOSIS — K738 Other chronic hepatitis, not elsewhere classified: Secondary | ICD-10-CM

## 2012-05-18 DIAGNOSIS — K745 Biliary cirrhosis, unspecified: Secondary | ICD-10-CM | POA: Diagnosis present

## 2012-05-18 DIAGNOSIS — M899 Disorder of bone, unspecified: Secondary | ICD-10-CM | POA: Insufficient documentation

## 2012-05-18 DIAGNOSIS — I4891 Unspecified atrial fibrillation: Secondary | ICD-10-CM | POA: Insufficient documentation

## 2012-05-18 DIAGNOSIS — K436 Other and unspecified ventral hernia with obstruction, without gangrene: Secondary | ICD-10-CM

## 2012-05-18 DIAGNOSIS — I10 Essential (primary) hypertension: Secondary | ICD-10-CM | POA: Insufficient documentation

## 2012-05-18 DIAGNOSIS — E669 Obesity, unspecified: Secondary | ICD-10-CM | POA: Diagnosis present

## 2012-05-18 DIAGNOSIS — Z23 Encounter for immunization: Secondary | ICD-10-CM | POA: Insufficient documentation

## 2012-05-18 DIAGNOSIS — E119 Type 2 diabetes mellitus without complications: Secondary | ICD-10-CM | POA: Insufficient documentation

## 2012-05-18 DIAGNOSIS — K420 Umbilical hernia with obstruction, without gangrene: Secondary | ICD-10-CM | POA: Insufficient documentation

## 2012-05-18 DIAGNOSIS — Z79899 Other long term (current) drug therapy: Secondary | ICD-10-CM | POA: Insufficient documentation

## 2012-05-18 DIAGNOSIS — E7800 Pure hypercholesterolemia, unspecified: Secondary | ICD-10-CM | POA: Insufficient documentation

## 2012-05-18 DIAGNOSIS — K7460 Unspecified cirrhosis of liver: Secondary | ICD-10-CM | POA: Insufficient documentation

## 2012-05-18 HISTORY — PX: INSERTION OF MESH: SHX5868

## 2012-05-18 HISTORY — PX: LIVER BIOPSY: SHX301

## 2012-05-18 HISTORY — PX: VENTRAL HERNIA REPAIR: SHX424

## 2012-05-18 LAB — CBC
HCT: 33.4 % — ABNORMAL LOW (ref 36.0–46.0)
Hemoglobin: 11.2 g/dL — ABNORMAL LOW (ref 12.0–15.0)
MCH: 30.5 pg (ref 26.0–34.0)
MCHC: 33.5 g/dL (ref 30.0–36.0)
MCV: 91 fL (ref 78.0–100.0)
Platelets: 278 10*3/uL (ref 150–400)
RBC: 3.67 MIL/uL — ABNORMAL LOW (ref 3.87–5.11)
RDW: 13.7 % (ref 11.5–15.5)
WBC: 8.6 10*3/uL (ref 4.0–10.5)

## 2012-05-18 LAB — GLUCOSE, CAPILLARY
Glucose-Capillary: 138 mg/dL — ABNORMAL HIGH (ref 70–99)
Glucose-Capillary: 171 mg/dL — ABNORMAL HIGH (ref 70–99)
Glucose-Capillary: 181 mg/dL — ABNORMAL HIGH (ref 70–99)
Glucose-Capillary: 189 mg/dL — ABNORMAL HIGH (ref 70–99)
Glucose-Capillary: 222 mg/dL — ABNORMAL HIGH (ref 70–99)

## 2012-05-18 LAB — PROTIME-INR
INR: 0.96 (ref 0.00–1.49)
Prothrombin Time: 12.7 seconds (ref 11.6–15.2)

## 2012-05-18 LAB — APTT: aPTT: 31 seconds (ref 24–37)

## 2012-05-18 LAB — CREATININE, SERUM
Creatinine, Ser: 0.59 mg/dL (ref 0.50–1.10)
GFR calc Af Amer: >90 mL/min (ref >90)
GFR calc non Af Amer: 87 mL/min — ABNORMAL LOW (ref >90)

## 2012-05-18 SURGERY — REPAIR, HERNIA, VENTRAL, LAPAROSCOPIC
Anesthesia: General | Site: Abdomen | Wound class: Clean Contaminated

## 2012-05-18 MED ORDER — MAGIC MOUTHWASH
15.0000 mL | Freq: Four times a day (QID) | ORAL | Status: DC | PRN
  Filled 2012-05-18: qty 15

## 2012-05-18 MED ORDER — CEFAZOLIN SODIUM-DEXTROSE 2-3 GM-% IV SOLR
2.0000 g | INTRAVENOUS | Status: AC
  Administered 2012-05-18: 2 g via INTRAVENOUS

## 2012-05-18 MED ORDER — SODIUM CHLORIDE 0.9 % IV SOLN: 250.0000 mL | INTRAVENOUS | Status: DC | PRN

## 2012-05-18 MED ORDER — SIMVASTATIN 40 MG PO TABS
40.0000 mg | ORAL_TABLET | Freq: Every evening | ORAL | Status: DC
  Administered 2012-05-18 – 2012-05-19 (×2): 40 mg via ORAL
  Filled 2012-05-18 (×3): qty 1

## 2012-05-18 MED ORDER — INSULIN ASPART 100 UNIT/ML ~~LOC~~ SOLN
0.0000 [IU] | Freq: Three times a day (TID) | SUBCUTANEOUS | Status: DC
  Administered 2012-05-18 – 2012-05-19 (×2): 3 [IU] via SUBCUTANEOUS
  Administered 2012-05-19: 8 [IU] via SUBCUTANEOUS

## 2012-05-18 MED ORDER — VITAMIN E 180 MG (400 UNIT) PO CAPS
400.0000 [IU] | ORAL_CAPSULE | Freq: Every day | ORAL | Status: DC
  Administered 2012-05-19 – 2012-05-20 (×2): 400 [IU] via ORAL
  Filled 2012-05-18 (×2): qty 1

## 2012-05-18 MED ORDER — METOPROLOL TARTRATE 25 MG PO TABS
25.0000 mg | ORAL_TABLET | Freq: Every day | ORAL | Status: DC
  Administered 2012-05-19 – 2012-05-20 (×2): 25 mg via ORAL
  Filled 2012-05-18 (×2): qty 1

## 2012-05-18 MED ORDER — SUFENTANIL CITRATE 50 MCG/ML IV SOLN
INTRAVENOUS | Status: DC | PRN
  Administered 2012-05-18: 15 ug via INTRAVENOUS
  Administered 2012-05-18 (×3): 10 ug via INTRAVENOUS
  Administered 2012-05-18: 5 ug via INTRAVENOUS

## 2012-05-18 MED ORDER — NEOSTIGMINE METHYLSULFATE 1 MG/ML IJ SOLN
INTRAMUSCULAR | Status: DC | PRN
  Administered 2012-05-18: 5 mg via INTRAVENOUS

## 2012-05-18 MED ORDER — LACTATED RINGERS IV SOLN
INTRAVENOUS | Status: DC
  Administered 2012-05-19: 04:00:00 via INTRAVENOUS

## 2012-05-18 MED ORDER — LIP MEDEX EX OINT
1.0000 "application " | TOPICAL_OINTMENT | Freq: Two times a day (BID) | CUTANEOUS | Status: DC
  Administered 2012-05-19 – 2012-05-20 (×3): 1 via TOPICAL

## 2012-05-18 MED ORDER — LACTATED RINGERS IR SOLN
Status: DC | PRN
  Administered 2012-05-18: 1000 mL

## 2012-05-18 MED ORDER — INSULIN DETEMIR 100 UNIT/ML ~~LOC~~ SOLN
16.0000 [IU] | Freq: Every day | SUBCUTANEOUS | Status: DC
  Administered 2012-05-18 – 2012-05-19 (×2): 16 [IU] via SUBCUTANEOUS
  Filled 2012-05-18: qty 10

## 2012-05-18 MED ORDER — CISATRACURIUM BESYLATE (PF) 10 MG/5ML IV SOLN
INTRAVENOUS | Status: DC | PRN
  Administered 2012-05-18: 10 mg via INTRAVENOUS
  Administered 2012-05-18: 2 mg via INTRAVENOUS

## 2012-05-18 MED ORDER — PROMETHAZINE HCL 25 MG/ML IJ SOLN: 12.5000 mg | Freq: Four times a day (QID) | INTRAMUSCULAR | Status: DC | PRN

## 2012-05-18 MED ORDER — STERILE WATER FOR IRRIGATION IR SOLN
Status: DC | PRN
  Administered 2012-05-18: 1500 mL

## 2012-05-18 MED ORDER — DIPHENHYDRAMINE HCL 50 MG/ML IJ SOLN: 12.5000 mg | Freq: Four times a day (QID) | INTRAMUSCULAR | Status: DC | PRN

## 2012-05-18 MED ORDER — METFORMIN HCL 500 MG PO TABS
1000.0000 mg | ORAL_TABLET | Freq: Two times a day (BID) | ORAL | Status: DC
  Administered 2012-05-19 – 2012-05-20 (×3): 1000 mg via ORAL
  Filled 2012-05-18 (×6): qty 2

## 2012-05-18 MED ORDER — PROPOFOL 10 MG/ML IV BOLUS
INTRAVENOUS | Status: DC | PRN
  Administered 2012-05-18: 130 mg via INTRAVENOUS

## 2012-05-18 MED ORDER — HYDRALAZINE HCL 25 MG PO TABS
25.0000 mg | ORAL_TABLET | Freq: Two times a day (BID) | ORAL | Status: DC
  Administered 2012-05-19 – 2012-05-20 (×3): 25 mg via ORAL
  Filled 2012-05-18 (×5): qty 1

## 2012-05-18 MED ORDER — PANTOPRAZOLE SODIUM 40 MG PO TBEC
40.0000 mg | DELAYED_RELEASE_TABLET | Freq: Every day | ORAL | Status: DC
  Administered 2012-05-19 – 2012-05-20 (×2): 40 mg via ORAL
  Filled 2012-05-18 (×2): qty 1

## 2012-05-18 MED ORDER — OXYCODONE HCL 5 MG PO TABS: 5.0000 mg | ORAL_TABLET | ORAL | Status: DC | PRN

## 2012-05-18 MED ORDER — SODIUM CHLORIDE 0.9 % IJ SOLN
3.0000 mL | Freq: Two times a day (BID) | INTRAMUSCULAR | Status: DC
  Administered 2012-05-19: 3 mL via INTRAVENOUS

## 2012-05-18 MED ORDER — VITAMIN C 500 MG PO TABS
500.0000 mg | ORAL_TABLET | Freq: Every day | ORAL | Status: DC
  Administered 2012-05-19 – 2012-05-20 (×2): 500 mg via ORAL
  Filled 2012-05-18 (×2): qty 1

## 2012-05-18 MED ORDER — ADULT MULTIVITAMIN W/MINERALS CH
1.0000 | ORAL_TABLET | Freq: Every day | ORAL | Status: DC
  Administered 2012-05-19 – 2012-05-20 (×2): 1 via ORAL
  Filled 2012-05-18 (×2): qty 1

## 2012-05-18 MED ORDER — FOLIC ACID 0.5 MG HALF TAB
500.0000 ug | ORAL_TABLET | Freq: Every day | ORAL | Status: DC
  Administered 2012-05-19 – 2012-05-20 (×2): 0.5 mg via ORAL
  Filled 2012-05-18 (×2): qty 1

## 2012-05-18 MED ORDER — INSULIN ASPART 100 UNIT/ML ~~LOC~~ SOLN: 0.0000 [IU] | Freq: Three times a day (TID) | SUBCUTANEOUS | Status: DC

## 2012-05-18 MED ORDER — ACETAMINOPHEN 650 MG RE SUPP: 650.0000 mg | Freq: Four times a day (QID) | RECTAL | Status: DC | PRN

## 2012-05-18 MED ORDER — NAPROXEN 500 MG PO TABS
500.0000 mg | ORAL_TABLET | Freq: Two times a day (BID) | ORAL | Status: DC | PRN
  Filled 2012-05-18: qty 1

## 2012-05-18 MED ORDER — ACETAMINOPHEN 10 MG/ML IV SOLN
INTRAVENOUS | Status: AC
  Filled 2012-05-18: qty 100

## 2012-05-18 MED ORDER — HEPARIN SODIUM (PORCINE) 5000 UNIT/ML IJ SOLN
5000.0000 [IU] | Freq: Three times a day (TID) | INTRAMUSCULAR | Status: DC
  Administered 2012-05-19 – 2012-05-20 (×4): 5000 [IU] via SUBCUTANEOUS
  Filled 2012-05-18 (×7): qty 1

## 2012-05-18 MED ORDER — CHLORHEXIDINE GLUCONATE 4 % EX LIQD: 1.0000 "application " | Freq: Once | CUTANEOUS | Status: DC

## 2012-05-18 MED ORDER — SUCCINYLCHOLINE CHLORIDE 20 MG/ML IJ SOLN
INTRAMUSCULAR | Status: DC | PRN
  Administered 2012-05-18: 80 mg via INTRAVENOUS

## 2012-05-18 MED ORDER — HYDRALAZINE HCL 20 MG/ML IJ SOLN
INTRAMUSCULAR | Status: DC | PRN
  Administered 2012-05-18: 5 mg via INTRAVENOUS

## 2012-05-18 MED ORDER — SODIUM CHLORIDE 0.9 % IJ SOLN: 3.0000 mL | INTRAMUSCULAR | Status: DC | PRN

## 2012-05-18 MED ORDER — BUPIVACAINE-EPINEPHRINE 0.25% -1:200000 IJ SOLN
INTRAMUSCULAR | Status: AC
  Filled 2012-05-18: qty 1

## 2012-05-18 MED ORDER — ACETAMINOPHEN 10 MG/ML IV SOLN
INTRAVENOUS | Status: DC | PRN
  Administered 2012-05-18: 1000 mg via INTRAVENOUS

## 2012-05-18 MED ORDER — EPHEDRINE SULFATE 50 MG/ML IJ SOLN
INTRAMUSCULAR | Status: DC | PRN
  Administered 2012-05-18 (×3): 10 mg via INTRAVENOUS

## 2012-05-18 MED ORDER — LABETALOL HCL 5 MG/ML IV SOLN
INTRAVENOUS | Status: DC | PRN
  Administered 2012-05-18: 5 mg via INTRAVENOUS

## 2012-05-18 MED ORDER — BUPIVACAINE-EPINEPHRINE 0.25% -1:200000 IJ SOLN
INTRAMUSCULAR | Status: DC | PRN
  Administered 2012-05-18: 80 mL

## 2012-05-18 MED ORDER — DIPHENHYDRAMINE HCL 12.5 MG/5ML PO ELIX: 12.5000 mg | ORAL_SOLUTION | Freq: Four times a day (QID) | ORAL | Status: DC | PRN

## 2012-05-18 MED ORDER — WARFARIN SODIUM 2.5 MG PO TABS: 2.5000 mg | ORAL_TABLET | Freq: Every day | ORAL | Status: DC

## 2012-05-18 MED ORDER — METOPROLOL TARTRATE 25 MG PO TABS
25.0000 mg | ORAL_TABLET | Freq: Two times a day (BID) | ORAL | Status: DC
  Filled 2012-05-18: qty 2

## 2012-05-18 MED ORDER — KETOCONAZOLE 2 % EX CREA: 1.0000 "application " | TOPICAL_CREAM | Freq: Every day | CUTANEOUS | Status: DC | PRN

## 2012-05-18 MED ORDER — GLYCOPYRROLATE 0.2 MG/ML IJ SOLN
INTRAMUSCULAR | Status: DC | PRN
  Administered 2012-05-18: 0.2 mg via INTRAVENOUS
  Administered 2012-05-18: .8 mg via INTRAVENOUS

## 2012-05-18 MED ORDER — HYDROMORPHONE HCL PF 1 MG/ML IJ SOLN
0.5000 mg | INTRAMUSCULAR | Status: DC | PRN
  Administered 2012-05-18: 1 mg via INTRAVENOUS
  Administered 2012-05-19: 0.5 mg via INTRAVENOUS
  Filled 2012-05-18 (×2): qty 1

## 2012-05-18 MED ORDER — PSYLLIUM 95 % PO PACK
1.0000 | PACK | Freq: Two times a day (BID) | ORAL | Status: DC
  Administered 2012-05-18 – 2012-05-20 (×4): 1 via ORAL
  Filled 2012-05-18 (×5): qty 1

## 2012-05-18 MED ORDER — FUROSEMIDE 40 MG PO TABS
60.0000 mg | ORAL_TABLET | Freq: Every morning | ORAL | Status: DC
  Administered 2012-05-19 – 2012-05-20 (×2): 60 mg via ORAL
  Filled 2012-05-18 (×2): qty 1

## 2012-05-18 MED ORDER — BUPIVACAINE-EPINEPHRINE PF 0.25-1:200000 % IJ SOLN
INTRAMUSCULAR | Status: AC
  Filled 2012-05-18: qty 30

## 2012-05-18 MED ORDER — METOPROLOL TARTRATE 50 MG PO TABS
50.0000 mg | ORAL_TABLET | Freq: Every day | ORAL | Status: DC
  Administered 2012-05-19: 50 mg via ORAL
  Filled 2012-05-18 (×3): qty 1

## 2012-05-18 MED ORDER — LIDOCAINE HCL 4 % MT SOLN
OROMUCOSAL | Status: DC | PRN
  Administered 2012-05-18: 4 mL via TOPICAL

## 2012-05-18 MED ORDER — IRBESARTAN 300 MG PO TABS
300.0000 mg | ORAL_TABLET | Freq: Every evening | ORAL | Status: DC
  Administered 2012-05-18 – 2012-05-19 (×2): 300 mg via ORAL
  Filled 2012-05-18 (×3): qty 1

## 2012-05-18 MED ORDER — LACTATED RINGERS IV BOLUS (SEPSIS): 1000.0000 mL | Freq: Three times a day (TID) | INTRAVENOUS | Status: DC | PRN

## 2012-05-18 MED ORDER — BIOTENE DRY MOUTH MT LIQD
15.0000 mL | Freq: Two times a day (BID) | OROMUCOSAL | Status: DC
  Administered 2012-05-18 – 2012-05-20 (×4): 15 mL via OROMUCOSAL

## 2012-05-18 MED ORDER — LINAGLIPTIN 5 MG PO TABS
5.0000 mg | ORAL_TABLET | Freq: Every day | ORAL | Status: DC
  Administered 2012-05-18 – 2012-05-20 (×3): 5 mg via ORAL
  Filled 2012-05-18 (×3): qty 1

## 2012-05-18 MED ORDER — LACTATED RINGERS IV SOLN
INTRAVENOUS | Status: DC
  Administered 2012-05-18: 14:00:00 via INTRAVENOUS
  Administered 2012-05-18: 1000 mL via INTRAVENOUS

## 2012-05-18 MED ORDER — URSODIOL 300 MG PO CAPS
300.0000 mg | ORAL_CAPSULE | Freq: Every day | ORAL | Status: DC
Start: 2012-05-18 — End: 2012-05-20
  Administered 2012-05-18 – 2012-05-20 (×3): 300 mg via ORAL
  Filled 2012-05-18 (×3): qty 1

## 2012-05-18 MED ORDER — PROPAFENONE HCL ER 325 MG PO CP12
325.0000 mg | ORAL_CAPSULE | Freq: Two times a day (BID) | ORAL | Status: DC
  Administered 2012-05-19 – 2012-05-20 (×3): 325 mg via ORAL
  Filled 2012-05-18 (×5): qty 1

## 2012-05-18 MED ORDER — CEFAZOLIN SODIUM-DEXTROSE 2-3 GM-% IV SOLR
INTRAVENOUS | Status: AC
  Filled 2012-05-18: qty 50

## 2012-05-18 MED ORDER — INSULIN ASPART 100 UNIT/ML ~~LOC~~ SOLN
0.0000 [IU] | Freq: Every day | SUBCUTANEOUS | Status: DC
  Administered 2012-05-19: 3 [IU] via SUBCUTANEOUS

## 2012-05-18 MED ORDER — 0.9 % SODIUM CHLORIDE (POUR BTL) OPTIME
TOPICAL | Status: DC | PRN
  Administered 2012-05-18: 1000 mL

## 2012-05-18 MED ORDER — ONDANSETRON HCL 4 MG/2ML IJ SOLN
INTRAMUSCULAR | Status: DC | PRN
  Administered 2012-05-18: 4 mg via INTRAVENOUS

## 2012-05-18 MED ORDER — METOPROLOL TARTRATE 12.5 MG HALF TABLET
12.5000 mg | ORAL_TABLET | Freq: Two times a day (BID) | ORAL | Status: DC | PRN
  Filled 2012-05-18: qty 1

## 2012-05-18 MED ORDER — ACETAMINOPHEN 325 MG PO TABS: 650.0000 mg | ORAL_TABLET | Freq: Four times a day (QID) | ORAL | Status: DC | PRN

## 2012-05-18 MED ORDER — ACETAMINOPHEN 325 MG PO TABS: 325.0000 mg | ORAL_TABLET | Freq: Four times a day (QID) | ORAL | Status: DC | PRN

## 2012-05-18 MED ORDER — ONDANSETRON HCL 4 MG/2ML IJ SOLN: 4.0000 mg | Freq: Four times a day (QID) | INTRAMUSCULAR | Status: DC | PRN

## 2012-05-18 MED ORDER — ALUM & MAG HYDROXIDE-SIMETH 200-200-20 MG/5ML PO SUSP: 30.0000 mL | Freq: Four times a day (QID) | ORAL | Status: DC | PRN

## 2012-05-18 MED ORDER — LORATADINE 10 MG PO TABS
10.0000 mg | ORAL_TABLET | Freq: Every day | ORAL | Status: DC
  Administered 2012-05-19 – 2012-05-20 (×2): 10 mg via ORAL
  Filled 2012-05-18 (×2): qty 1

## 2012-05-18 SURGICAL SUPPLY — 54 items
APPLIER CLIP 5 13 M/L LIGAMAX5 (MISCELLANEOUS)
APR CLP MED LRG 5 ANG JAW (MISCELLANEOUS)
BINDER ABD UNIV 12 45-62 (WOUND CARE) IMPLANT
BINDER ABDOMINAL 46IN 62IN (WOUND CARE) ×3
CANISTER SUCTION 2500CC (MISCELLANEOUS) ×3 IMPLANT
CATH KIT ON-Q SILVERSOAK 7.5 (CATHETERS) IMPLANT
CATH KIT ON-Q SILVERSOAK 7.5IN (CATHETERS) IMPLANT
CLIP APPLIE 5 13 M/L LIGAMAX5 (MISCELLANEOUS) IMPLANT
CLOTH BEACON ORANGE TIMEOUT ST (SAFETY) ×3 IMPLANT
DECANTER SPIKE VIAL GLASS SM (MISCELLANEOUS) ×3 IMPLANT
DEVICE SECURE STRAP 25 ABSORB (INSTRUMENTS) ×1 IMPLANT
DEVICE TROCAR PUNCTURE CLOSURE (ENDOMECHANICALS) ×2 IMPLANT
DRAPE LAPAROSCOPIC ABDOMINAL (DRAPES) ×3 IMPLANT
DRSG TEGADERM 2-3/8X2-3/4 SM (GAUZE/BANDAGES/DRESSINGS) ×7 IMPLANT
DRSG TEGADERM 4X4.75 (GAUZE/BANDAGES/DRESSINGS) ×1 IMPLANT
ELECT REM PT RETURN 9FT ADLT (ELECTROSURGICAL) ×3
ELECTRODE REM PT RTRN 9FT ADLT (ELECTROSURGICAL) ×2 IMPLANT
FILTER SMOKE EVAC LAPAROSHD (FILTER) IMPLANT
GAUZE SPONGE 2X2 8PLY STRL LF (GAUZE/BANDAGES/DRESSINGS) IMPLANT
GLOVE ECLIPSE 8.0 STRL XLNG CF (GLOVE) ×3 IMPLANT
GLOVE INDICATOR 8.0 STRL GRN (GLOVE) ×5 IMPLANT
GOWN STRL NON-REIN LRG LVL3 (GOWN DISPOSABLE) ×2 IMPLANT
GOWN STRL REIN XL XLG (GOWN DISPOSABLE) ×14 IMPLANT
HAND ACTIVATED (MISCELLANEOUS) IMPLANT
KIT BASIN OR (CUSTOM PROCEDURE TRAY) ×3 IMPLANT
MARKER SKIN DUAL TIP RULER LAB (MISCELLANEOUS) ×3 IMPLANT
MESH PHYSIO OVAL 20X25CM (Mesh General) ×1 IMPLANT
NDL BIOPSY 14X6 SOFT TISS (NEEDLE) IMPLANT
NDL SPNL 22GX3.5 QUINCKE BK (NEEDLE) IMPLANT
NEEDLE BIOPSY 14X6 SOFT TISS (NEEDLE) ×3 IMPLANT
NEEDLE SPNL 22GX3.5 QUINCKE BK (NEEDLE) ×3 IMPLANT
NS IRRIG 1000ML POUR BTL (IV SOLUTION) ×3 IMPLANT
PAD TELFA 2X3 NADH STRL (GAUZE/BANDAGES/DRESSINGS) ×1 IMPLANT
PENCIL BUTTON HOLSTER BLD 10FT (ELECTRODE) IMPLANT
SCISSORS LAP 5X35 DISP (ENDOMECHANICALS) ×3 IMPLANT
SET IRRIG TUBING LAPAROSCOPIC (IRRIGATION / IRRIGATOR) ×1 IMPLANT
SLEEVE XCEL OPT CAN 5 100 (ENDOMECHANICALS) ×1 IMPLANT
SLEEVE Z-THREAD 5X100MM (TROCAR) ×4 IMPLANT
SPONGE GAUZE 2X2 STER 10/PKG (GAUZE/BANDAGES/DRESSINGS)
STRIP CLOSURE SKIN 1/2X4 (GAUZE/BANDAGES/DRESSINGS) ×1 IMPLANT
SUT MNCRL AB 4-0 PS2 18 (SUTURE) ×3 IMPLANT
SUT PDS AB 1 CT1 27 (SUTURE) ×3 IMPLANT
SUT PROLENE 1 CT 1 30 (SUTURE) ×6 IMPLANT
SUT VIC AB 2-0 UR6 27 (SUTURE) IMPLANT
TOWEL OR 17X26 10 PK STRL BLUE (TOWEL DISPOSABLE) ×3 IMPLANT
TRAY FOLEY CATH 14FRSI W/METER (CATHETERS) ×1 IMPLANT
TRAY LAP CHOLE (CUSTOM PROCEDURE TRAY) ×3 IMPLANT
TROCAR BLADELESS OPT 5 100 (ENDOMECHANICALS) ×1 IMPLANT
TROCAR XCEL BLADELESS 5X75MML (TROCAR) ×3 IMPLANT
TROCAR XCEL NON-BLD 11X100MML (ENDOMECHANICALS) ×1 IMPLANT
TROCAR Z-THREAD FIOS 11X100 BL (TROCAR) IMPLANT
TROCAR Z-THREAD FIOS 5X100MM (TROCAR) ×2 IMPLANT
TUBING INSUFFLATION 10FT LAP (TUBING) ×3 IMPLANT
TUNNELER SHEATH ON-Q 16GX12 DP (PAIN MANAGEMENT) ×2 IMPLANT

## 2012-05-18 NOTE — Anesthesia Preprocedure Evaluation (Addendum)
Anesthesia Evaluation  Patient identified by MRN, date of birth, ID band Patient awake    Reviewed: Allergy & Precautions, H&P , NPO status , Patient's Chart, lab work & pertinent test results, reviewed documented beta blocker date and time   Airway Mallampati: III TM Distance: >3 FB Neck ROM: Full    Dental  (+) Dental Advisory Given, Teeth Intact and Partial Upper   Pulmonary neg pulmonary ROS,  breath sounds clear to auscultation  Pulmonary exam normal       Cardiovascular hypertension, Pt. on home beta blockers and Pt. on medications + dysrhythmias Atrial Fibrillation Rhythm:Irregular Rate:Normal  Myoview 04/2012 WNL   Neuro/Psych  Neuromuscular disease negative psych ROS   GI/Hepatic hiatal hernia, GERD-  Medicated,(+) Cirrhosis -       ,   Endo/Other  diabetes, Type 2, Insulin Dependent and Oral Hypoglycemic Agents  Renal/GU negative Renal ROS     Musculoskeletal negative musculoskeletal ROS (+)   Abdominal (+) + obese,   Peds  Hematology negative hematology ROS (+)   Anesthesia Other Findings   Reproductive/Obstetrics                         Anesthesia Physical Anesthesia Plan  ASA: III  Anesthesia Plan: General   Post-op Pain Management:    Induction: Intravenous  Airway Management Planned: Oral ETT  Additional Equipment:   Intra-op Plan:   Post-operative Plan: Extubation in OR  Informed Consent: I have reviewed the patients History and Physical, chart, labs and discussed the procedure including the risks, benefits and alternatives for the proposed anesthesia with the patient or authorized representative who has indicated his/her understanding and acceptance.   Dental advisory given  Plan Discussed with: CRNA  Anesthesia Plan Comments:         Anesthesia Quick Evaluation

## 2012-05-18 NOTE — H&P (Signed)
Sara Hancock [facility]  16-Dec-1934 [id number]   This patient is a 78 y.o.female who presents today for surgical evaluation at the request of Dr. Sharlett Iles.   Reason for visit: Enlarging mass around bellybutton. Concern for hernia.   Pleasant obese female with a long-standing history of cirrhosis. Has noticed an enlarging lump around her bellybutton. Occasionally gives her pain and discomfort. CT scan confirmed periumbilical ventral hernia incarcerated with fat. She is chronically anticoagulated on warfarin for atrial fibrillation. She is followed by Dr. Sharlett Iles with gastroenterology for her cirrhosis and other GI issues. Has a daily bowel movement. No episodes of nausea or vomiting. Weight has been stable. No fevers, chills, sweats. No difficulty with urination. No history of urinary tract infections. No history of MRSA or other skin infections. Can walk about 10 minutes before her back pain makes her stop. No heart attacks. Diabetes with hemoglobin A1c around seven.  She had a tubal ligation but no other surgeries of her abdomen. She has not had episodes of encephalopathy, ascites, liver failure, uncontrolled bleeding. Because of the enlarging mass suspicious for a hernia, she was sent to [place] for surgical evaluation.  GI interested in getting liver biopsy while in OR as well  Past Medical History  Diagnosis Date  . Type II or unspecified type diabetes mellitus without mention of complication, not stated as uncontrolled   . Pure hypercholesterolemia   . HTN (hypertension)   . Anemia   . Biliary cirrhosis   . Diverticulosis of colon with hemorrhage 06/08/2000  . Esophageal stenosis 01/08/11  . Hiatal hernia 01/08/11  . Esophagitis 01/08/11  . Hyperplastic colon polyp 06/08/2000, 07/31/2003, 01/08/11  . Hx of adenomatous colonic polyps 01/08/11  . GERD (gastroesophageal reflux disease)   . Osteopenia   . Cholelithiasis 01/09/12  . Uterine fibroid 01/09/12  . High risk medication use     on Rythmol  .  Obesity   . PAF (paroxysmal atrial fibrillation)     occurring in the setting of bradycardia and mild first degree AV block followed by Dr. Jens Som  . Arthritis     Past Surgical History  Procedure Date  . Thyroidectomy, partial 1980  . Tubal ligation 1972  . Breast cyst excision     left  . Colonoscopy   . Polypectomy     History   Social History  . Marital Status: Married    Spouse Name: N/A    Number of Children: 2  . Years of Education: N/A   Occupational History  . retired    Social History Main Topics  . Smoking status: Never Smoker   . Smokeless tobacco: Never Used  . Alcohol Use: No  . Drug Use: No  . Sexually Active: Not Currently   Other Topics Concern  . Not on file   Social History Narrative  . No narrative on file    Family History  Problem Relation Age of Onset  . Diabetes Sister     x 2  . Diabetes Brother   . Colon cancer Neg Hx   . Heart attack Father   . Stroke Mother     Current Facility-Administered Medications  Medication Dose Route Frequency Provider Last Rate Last Dose  . ceFAZolin (ANCEF) IVPB 2 g/50 mL premix  2 g Intravenous 60 min Pre-Op Adin Hector, MD      . chlorhexidine (HIBICLENS) 4 % liquid 1 application  1 application Topical Once Adin Hector, MD      . chlorhexidine (HIBICLENS)  4 % liquid 1 application  1 application Topical Once Adin Hector, MD      . lactated ringers infusion   Intravenous Continuous Nickie Retort, MD 125 mL/hr at 05/18/12 1231 1,000 mL at 05/18/12 1231     Allergies  Allergen Reactions  . Ace Inhibitors Anaphylaxis  . Capoten (Captopril) Anaphylaxis    Throat swells shut  . Neomycin-Bacitracin Zn-Polymyx Rash     BP 183/90  Pulse 67  Temp 97.8 F (36.6 C)  Resp 20  SpO2 97%  Objective:   Physical Exam  Constitutional: She is oriented to person, place, and time. She appears well-developed and well-nourished. No distress.  HENT:  Head: Normocephalic.  Mouth/Throat:  Oropharynx is clear and moist. No oropharyngeal exudate.  Eyes: Conjunctivae normal, EOM and lids are normal. Pupils are equal, round, and reactive to light. Right conjunctiva is not injected. Left conjunctiva is not injected. No scleral icterus.  Neck: Normal range of motion. Neck supple. No tracheal deviation present.  Cardiovascular: Normal rate, regular rhythm and intact distal pulses.  Pulmonary/Chest: Effort normal and breath sounds normal. No respiratory distress. She exhibits no tenderness.  Abdominal: Soft. She exhibits no shifting dullness, no distension, no pulsatile liver, no fluid wave and no ascites. There is tenderness in the periumbilical area. There is no rebound, no guarding and no CVA tenderness. A hernia is present. Hernia confirmed positive in the ventral area. Hernia confirmed negative in the right inguinal area and confirmed negative in the left inguinal area.    Obese but soft  Genitourinary: No vaginal discharge found.  Musculoskeletal: Normal range of motion. She exhibits no tenderness.  Lymphadenopathy:  She has no cervical adenopathy.  Right: No inguinal adenopathy present.  Left: No inguinal adenopathy present.  Neurological: She is alert and oriented to person, place, and time. No cranial nerve deficit. She exhibits normal muscle tone. Coordination normal.  Skin: Skin is warm and dry. No rash noted. She is not diaphoretic. No erythema.  No telangiectasias/caput medusae  Psychiatric: She has a normal mood and affect. Her behavior is normal. Judgment and thought content normal.    Results:   Labs: Results for orders placed during the hospital encounter of 05/18/12 (from the past 48 hour(s))  APTT     Status: Normal   Collection Time   05/18/12 10:15 AM      Component Value Range Comment   aPTT 31  24 - 37 seconds   PROTIME-INR     Status: Normal   Collection Time   05/18/12 10:15 AM      Component Value Range Comment   Prothrombin Time 12.7  11.6 - 15.2  seconds    INR 0.96  0.00 - 1.49   GLUCOSE, CAPILLARY     Status: Abnormal   Collection Time   05/18/12 10:17 AM      Component Value Range Comment   Glucose-Capillary 189 (*) 70 - 99 mg/dL     Imaging / Studies: Dg Chest 2 View  05/10/2012  *RADIOLOGY REPORT*  Clinical Data: Evaluation for ventral hernia repair.  Diabetes, hypertension and cirrhosis.  Nonsmoker  CHEST - 2 VIEW  Comparison: 01/02/2010and CT 02/03/2012  Findings: Cardiomegaly is identified and appears stable.  The mediastinal contour is otherwise within normal limits.  The lung fields appear clear with no evidence for focal infiltrate or congestive failure.  No pleural fluid is seen.  Mild central peribronchial cuffing is noted.  Bony structures demonstrate a diffuse decrease in density with  vertebral body height loss at the L2 level.  This is unchanged in comparison with prior recent CT.  IMPRESSION: No new focal or acute cardiopulmonary abnormality suggested. Stable mild cardiomegaly and L2 vertebral compression   Original Report Authenticated By: Ponciano Ort, M.D.     Medications / Allergies: per chart  Antibiotics: Anti-infectives     Start     Dose/Rate Route Frequency Ordered Stop   05/18/12 1005   ceFAZolin (ANCEF) IVPB 2 g/50 mL premix        2 g 100 mL/hr over 30 Minutes Intravenous 60 min pre-op 05/18/12 1005            Assessment  Clarcie Lowis Berkey  78 y.o. female  Day of Surgery  Procedure(s): LAPAROSCOPIC VENTRAL HERNIA INSERTION OF MESH  Problem List:  Active Problems:  * No active hospital problems. *    Assessment:   Incarcerated periumbilical ventral hernia. 10 cm mass. CT scan consistent with 4-5 cm defect    Plan:   Given the fact that has gotten larger, is incarcerated, and is uncomfortable; I think she would benefit from repair. While she does have cirrhosis, there is no strong evidence of portal hypertension or hepatic insufficiency. Her disease is stable. She is a reasonable risk  to repair. She is leaning more toward surgery but wants to wait until after the holiday season. I definitely would want cardiac clearance to make sure that anticoagulation issues perioperatively are dressed. Plan liver biopsy core needle as well per GI.  Also to make sure her cardiopulmonary status is good enough to tolerate a laparoscopic repair with mesh:   The anatomy & physiology of the abdominal wall was discussed. The pathophysiology of hernias was discussed. Natural history risks without surgery including progeressive enlargement, pain, incarceration & strangulation was discussed. Contributors to complications such as smoking, obesity, diabetes, prior surgery, etc were discussed.  I feel the risks of no intervention will lead to serious problems that outweigh the operative risks; therefore, I recommended surgery to reduce and repair the hernia. I explained laparoscopic techniques with possible need for an open approach. I noted the probable use of mesh to patch and/or buttress the hernia repair   Risks such as bleeding, infection, abscess, need for further treatment, heart attack, death, and other risks were discussed. I noted a good likelihood this will help address the problem. Goals of post-operative recovery were discussed as well. Possibility that this will not correct all symptoms was explained. I stressed the importance of low-impact activity, aggressive pain control, avoiding constipation, & not pushing through pain to minimize risk of post-operative chronic pain or injury. Possibility of reherniation especially with smoking, obesity, diabetes, immunosuppression, and other health conditions was discussed. We will work to minimize complications.   An educational handout further explaining the pathology & treatment options was given as well. Questions were answered. The patient expresses understanding & wishes to proceed with surgery.   Cardiac clearance done.  I have re-reviewed the the  patient's records, history, medications, and allergies.  I have re-examined the patient.  I again discussed intraoperative plans and goals of post-operative recovery.  The patient agrees to proceed.           Adin Hector, M.D., F.A.C.S. Gastrointestinal and Minimally Invasive Surgery [facility] [address], [place], [place] [address] [phone] Main / Paging [phone] Voice Mail   05/18/2012  CARE TEAM:  PCP: [name], DO  Outpatient Care Team: Patient  Care Team: [name] as PCP - General (Family Medicine) [name], MD as Consulting Physician (Gastroenterology) [name], MD as Consulting Physician (Cardiology)  Inpatient Treatment Team: Treatment Team: Attending Provider: Adin Hector, MD

## 2012-05-18 NOTE — Op Note (Addendum)
05/18/2012  3:50 PM  PATIENT:  Sara Hancock  78 y.o. female  Patient Care Team: [name] as PCP - General (Family Medicine) [name], MD as Consulting Physician (Gastroenterology) [name], MD as Consulting Physician (Cardiology)  PRE-OPERATIVE DIAGNOSIS:    incarcerated ventral wall hernia cirrhosis  POST-OPERATIVE DIAGNOSIS:    incarcerated ventral wall hernia cirrhosis  PROCEDURE:  Procedure(s): LAPAROSCOPIC VENTRAL HERNIA INSERTION OF MESH LIVER BIOPSY  SURGEON:  Surgeon(s): Adin Hector, MD  ASSISTANT: none   ANESTHESIA:   local and general  EBL:  Total I/O In: 1000 [I.V.:1000] Out: 275 [Urine:275]  Delay start of Pharmacological VTE agent (>24hrs) due to surgical blood loss or risk of bleeding:  yes  DRAINS: none   SPECIMEN:  No Specimen  DISPOSITION OF SPECIMEN:  N/A  COUNTS:  YES  PLAN OF CARE: Admit for overnight observation  PATIENT DISPOSITION:  PACU - hemodynamically stable.  INDICATION: Pleasant patient has developed a ventral wall abdominal hernia, periumbilical incarcerated.  Cirrhosis of liver as well.  Recommendation was made for surgical repair:   The anatomy & physiology of the abdominal wall was discussed. The pathophysiology of hernias was discussed. Natural history risks without surgery including progeressive enlargement, pain, incarceration & strangulation was discussed. Contributors to complications such as smoking, obesity, diabetes, prior surgery, etc were discussed.  I feel the risks of no intervention will lead to serious problems that outweigh the operative risks; therefore, I recommended surgery to reduce and repair the hernia. I explained laparoscopic techniques with possible need for an open approach. I noted the probable use of mesh to patch and/or buttress the hernia repair  Risks such as bleeding, infection, abscess, need for further treatment, heart attack, death, and other risks were discussed. I  noted a good likelihood this will help address the problem. Goals of post-operative recovery were discussed as well. Possibility that this will not correct all symptoms was explained. I stressed the importance of low-impact activity, aggressive pain control, avoiding constipation, & not pushing through pain to minimize risk of post-operative chronic pain or injury. Possibility of reherniation especially with smoking, obesity, diabetes, immunosuppression, and other health conditions was discussed. We will work to minimize complications.  An educational handout further explaining the pathology & treatment options was given as well. Questions were answered. The patient expresses understanding & wishes to proceed with surgery.   OR FINDINGS: [id number] periumbilical VWH incarcerated with omentum.     DESCRIPTION:   Informed consent was confirmed. The patient underwent general anaesthesia without difficulty. The patient was positioned appropriately. VTE prevention in place. The patient's abdomen was clipped, prepped, & draped in a sterile fashion. Surgical timeout confirmed our plan.  The patient was positioned in reverse Trendelenburg. Abdominal entry was gained using optical entry technique in the left upper abdomen. Entry was clean. I induced carbon dioxide insufflation. Camera inspection revealed no injury. Extra ports were carefully placed under direct laparoscopic visualization.   I could see the hernia in the central midline abdomen.   I did laparoscopic lysis of adhesions to help reduce the omentum out of the hernia & expose the entire anterior abdominal wall.  I primarily used and cautery scissors.    I made sure hemostasis was good.  I freed the falciform ligament off the supraumbilical peritoneum to expose the peritoneum.  I did 14G Tru-Cut core biopsies of enlarged left cirrhotic hepatic lobe x4 passes to get 3 good cores.  I ensured hemostasis.  I mapped out the  region using a needle passer.   To  ensure that I would have at least 5-10 cm radial coverage outside of the hernia defect, I chose a 25x20 cm dual sided Physiomesh (ultralightweight polypropylene/monocryl).  I placed #1 Prolene stitches around its edge about every 5 cm = 12 total.  I rolled the mesh & placed into the peritoneal cavity through the hernia defect.  I unrolled  the mesh and positioned it 25cm transversely & 20cm vertically, centering it around the hernia appropriately.  I secured the mesh to cover up the hernia defect using a laparoscopic suture passer to pass the tails of the Prolene through the abdominal wall & tagged them with clamps.  I started out in four corners to make sure I had the mesh centered over the hernia defect appropriately, and then proceeded to work in quadrants.  We evacuated CO2 & desufflated the abdomen.  I tied the fascial stitches down.  I freed the umbilicus off the fascia & freed off hernia sac.  I primarily closed the hernia defect transversely with #1 PDS interrupted stitches.   I reinsufflated the abdomen.  The mesh provided at least 5-10 cm circumferential coverage around the entire region of hernia defects.   I tacked the edges & central part of the mesh with SecureStrap absorbable tacks.   Hemostasis was excellent.  I closed the fascia port site on the 10 mm port using a 0 Vicryl stitch using laparoscopic intracorporeal suture passer.  I did reinspection. Hemostasis was good. Mesh laid well. Capnoperitoneum was evacuated. Ports were removed. The skin was closed with Monocryl at the port sites and Steri-Strips on the fascial stitch puncture sites. OnQ catheters were placed and the sheathes peeled away. On-Q pump was secured. Patient is being extubated to go to the recovery room. I'm about discussed operative findings with the patient's family.

## 2012-05-18 NOTE — Preoperative (Signed)
Beta Blockers   Reason not to administer Beta Blockers:Not Applicable 

## 2012-05-18 NOTE — Anesthesia Postprocedure Evaluation (Signed)
Anesthesia Post Note  Patient: Sara Hancock  Procedure(s) Performed: Procedure(s) (LRB): LAPAROSCOPIC VENTRAL HERNIA (N/A) INSERTION OF MESH (N/A) LIVER BIOPSY ()  Anesthesia type: General  Patient location: PACU  Post pain: Pain level controlled  Post assessment: Post-op Vital signs reviewed  Last Vitals: BP 145/72  Pulse 66  Temp 36.6 C  Resp 14  SpO2 100%  Post vital signs: Reviewed  Level of consciousness: sedated  Complications: No apparent anesthesia complications

## 2012-05-18 NOTE — Transfer of Care (Signed)
Immediate Anesthesia Transfer of Care Note  Patient: Sara Hancock  Procedure(s) Performed: Procedure(s) (LRB): LAPAROSCOPIC VENTRAL HERNIA (N/A) INSERTION OF MESH (N/A) LIVER BIOPSY ()  Patient Location: PACU  Anesthesia Type: General  Level of Consciousness: sedated, patient cooperative and responds to stimulaton  Airway & Oxygen Therapy: Patient Spontanous Breathing and Patient connected to face mask oxgen  Post-op Assessment: Report given to PACU RN and Post -op Vital signs reviewed and stable  Post vital signs: Reviewed and stable  Complications: No apparent anesthesia complications

## 2012-05-18 NOTE — Anesthesia Procedure Notes (Signed)
Procedure Name: Intubation Date/Time: 05/18/2012 1:55 PM Performed by: Danley Danker L Patient Re-evaluated:Patient Re-evaluated prior to inductionOxygen Delivery Method: Circle system utilized Preoxygenation: Pre-oxygenation with 100% oxygen Intubation Type: IV induction Ventilation: Mask ventilation without difficulty and Oral airway inserted - appropriate to patient size Laryngoscope Size: Sabra Heck and 2 Grade View: Grade I Tube type: Oral Tube size: 7.5 mm Number of attempts: 1 Airway Equipment and Method: Stylet and LTA kit utilized Placement Confirmation: ETT inserted through vocal cords under direct vision,  breath sounds checked- equal and bilateral and positive ETCO2 Secured at: 21 cm Dental Injury: Teeth and Oropharynx as per pre-operative assessment

## 2012-05-19 ENCOUNTER — Encounter (HOSPITAL_COMMUNITY): Payer: Self-pay | Admitting: Surgery

## 2012-05-19 LAB — GLUCOSE, CAPILLARY
Glucose-Capillary: 214 mg/dL — ABNORMAL HIGH (ref 70–99)
Glucose-Capillary: 183 mg/dL — ABNORMAL HIGH (ref 70–99)
Glucose-Capillary: 269 mg/dL — ABNORMAL HIGH (ref 70–99)
Glucose-Capillary: 119 mg/dL — ABNORMAL HIGH (ref 70–99)
Glucose-Capillary: 172 mg/dL — ABNORMAL HIGH (ref 70–99)

## 2012-05-19 MED ORDER — WARFARIN SODIUM 5 MG PO TABS
5.0000 mg | ORAL_TABLET | ORAL | Status: DC
  Administered 2012-05-19: 5 mg via ORAL
  Filled 2012-05-19 (×3): qty 1

## 2012-05-19 MED ORDER — WARFARIN SODIUM 2.5 MG PO TABS: 2.5000 mg | ORAL_TABLET | ORAL | Status: DC

## 2012-05-19 MED ORDER — HYDROCODONE-ACETAMINOPHEN 5-325 MG PO TABS
1.0000 | ORAL_TABLET | Freq: Three times a day (TID) | ORAL | Status: DC
  Administered 2012-05-19 – 2012-05-20 (×4): 1 via ORAL
  Filled 2012-05-19 (×4): qty 1

## 2012-05-19 MED ORDER — NAPROXEN 500 MG PO TABS
500.0000 mg | ORAL_TABLET | Freq: Two times a day (BID) | ORAL | Status: DC
  Administered 2012-05-19 – 2012-05-20 (×3): 500 mg via ORAL
  Filled 2012-05-19 (×5): qty 1

## 2012-05-19 MED ORDER — ACETAMINOPHEN 325 MG PO TABS: 325.0000 mg | ORAL_TABLET | Freq: Three times a day (TID) | ORAL | Status: DC | PRN

## 2012-05-19 MED ORDER — WARFARIN SODIUM 5 MG PO TABS
5.0000 mg | ORAL_TABLET | ORAL | Status: DC
  Filled 2012-05-19: qty 1

## 2012-05-19 MED ORDER — WARFARIN - PHYSICIAN DOSING INPATIENT: Freq: Every day | Status: DC

## 2012-05-19 MED ORDER — HYDROCODONE-ACETAMINOPHEN 5-325 MG PO TABS
1.0000 | ORAL_TABLET | Freq: Four times a day (QID) | ORAL | Status: DC | PRN
  Administered 2012-05-19: 1 via ORAL
  Filled 2012-05-19: qty 1

## 2012-05-19 NOTE — Progress Notes (Signed)
Sara Hancock:[id number] 1934-09-18   Subjective:  Sore Not wanting to take pain meds - loopy w oxycodone Not walking yet  Objective:  Vital signs:  Filed Vitals:   05/18/12 2015 05/18/12 2154 05/19/12 0144 05/19/12 0512  BP: 115/54 115/55 121/63 122/57  Pulse: 71 70 70 61  Temp: 97.9 F (36.6 C) 97.7 F (36.5 C) 98 F (36.7 C) 98.6 F (37 C)  TempSrc: Oral Oral Oral Tympanic  Resp: 16 18 16 16   Height:      Weight:      SpO2: 96% 94% 100% 99%    Last BM Date: 05/17/12  Intake/Output   Yesterday:  01/07 0701 - 01/08 0700 In: 2199.2 [P.O.:340; I.V.:1859.2] Out: 1175 [Urine:1175] This shift:     Bowel function:  Flatus: y  BM: n  Physical Exam:  General: Pt awake/alert/oriented x4 in no acute distress Eyes: PERRL, normal EOM.  Sclera clear.  No icterus Neuro: CN II-XII intact w/o focal sensory/motor deficits. Lymph: No head/neck/groin lymphadenopathy Psych:  No delerium/psychosis/paranoia.  Mildly anxious but consolable HENT: Normocephalic, Mucus membranes moist.  No thrush Neck: Supple, No tracheal deviation Chest: No chest wall pain w good excursion CV:  Pulses intact.  Regular rhythm MS: Normal AROM mjr joints.  No obvious deformity Abdomen: Soft.  Nondistended.  Obese Mild/mod tender at incisions only.  No incarcerated hernias.  Ext:  SCDs BLE.  No mjr edema.  No cyanosis Skin: No petechiae / purpurae  Problem List:  Active Problems:  * No active hospital problems. *    Assessment  Sara Hancock  78 y.o. female  1 Day Post-Op  Procedure(s): LAPAROSCOPIC VENTRAL HERNIA INSERTION OF MESH LIVER BIOPSY  Fair pain control  Plan:  -change pain regimen -VTE prophylaxis- SCDs, etc -Bowel regimen -mobilize as tolerated to help recovery prob hold off on d/c until tomorrow at soonest  Adin Hector, M.D., F.A.C.S. Gastrointestinal and Minimally Invasive Surgery [facility] [address], [address] [place], [place] [address] [phone] Main / Paging [phone] Voice Mail   05/19/2012  CARE TEAM:  PCP: [name], DO  Outpatient Care Team: Patient Care Team: [name] as PCP - General (Family Medicine) [name], MD as Consulting Physician (Gastroenterology) [name], MD as Consulting Physician (Cardiology)  Inpatient Treatment Team: Treatment Team: Attending Provider: Adin Hector, MD; Registered Nurse: Diamantina Monks, RN; Technician: Leda Quail, NT   Results:   Labs: Results for orders placed during the hospital encounter of 05/18/12 (from the past 48 hour(s))  APTT     Status: Normal   Collection Time   05/18/12 10:15 AM      Component Value Range Comment   aPTT 31  24 - 37 seconds   PROTIME-INR     Status: Normal   Collection Time   05/18/12 10:15 AM      Component Value Range Comment   Prothrombin Time 12.7  11.6 - 15.2 seconds    INR 0.96  0.00 - 1.49   GLUCOSE, CAPILLARY     Status: Abnormal   Collection Time   05/18/12 10:17 AM      Component Value Range Comment   Glucose-Capillary 189 (*) 70 - 99 mg/dL   GLUCOSE, CAPILLARY     Status: Abnormal   Collection Time   05/18/12  4:02 PM      Component Value Range Comment   Glucose-Capillary 171 (*) 70 - 99 mg/dL  Comment 1 Documented in Chart      Comment 2 Notify RN     GLUCOSE, CAPILLARY     Status: Abnormal   Collection Time   05/18/12  5:43 PM      Component Value Range Comment   Glucose-Capillary 181 (*) 70 - 99 mg/dL    Comment 1 Notify RN      Comment 2 Documented in Chart     CBC     Status: Abnormal   Collection Time   05/18/12  6:05 PM      Component Value Range Comment   WBC 8.6  4.0 - 10.5 K/uL    RBC 3.67 (*) 3.87 - 5.11 MIL/uL    Hemoglobin 11.2 (*) 12.0 - 15.0 g/dL    HCT 33.4 (*) 36.0 - 46.0 %    MCV 91.0  78.0 - 100.0 fL    MCH 30.5  26.0 - 34.0 pg    MCHC 33.5  30.0 - 36.0 g/dL    RDW 13.7  11.5 - 15.5 %    Platelets 278  150 - 400 K/uL    CREATININE, SERUM     Status: Abnormal   Collection Time   05/18/12  6:05 PM      Component Value Range Comment   Creatinine, Ser 0.59  0.50 - 1.10 mg/dL    GFR calc non Af Amer 87 (*) >90 mL/min    GFR calc Af Amer >90  >90 mL/min   GLUCOSE, CAPILLARY     Status: Abnormal   Collection Time   05/18/12  8:48 PM      Component Value Range Comment   Glucose-Capillary 138 (*) 70 - 99 mg/dL   GLUCOSE, CAPILLARY     Status: Abnormal   Collection Time   05/18/12 11:50 PM      Component Value Range Comment   Glucose-Capillary 222 (*) 70 - 99 mg/dL   GLUCOSE, CAPILLARY     Status: Abnormal   Collection Time   05/19/12  4:00 AM      Component Value Range Comment   Glucose-Capillary 214 (*) 70 - 99 mg/dL     Imaging / Studies: No results found.  Medications / Allergies: per chart  Antibiotics: Anti-infectives     Start     Dose/Rate Route Frequency Ordered Stop   05/18/12 1005   ceFAZolin (ANCEF) IVPB 2 g/50 mL premix        2 g 100 mL/hr over 30 Minutes Intravenous 60 min pre-op 05/18/12 1005 05/18/12 1400

## 2012-05-19 NOTE — Progress Notes (Signed)
PHARMACIST - PHYSICIAN COMMUNICATION DR:  Johney Maine CONCERNING: Pharmacy Care Issues Regarding Warfarin Labs  RECOMMENDATION (Action Taken): A baseline and daily protime for three days has been ordered to meet the [facility] National Patient safety goal and comply with the current [place].   The Pharmacy will defer all warfarin dose order changes and follow up of lab results to the prescriber unless an additional order to initiate a "pharmacy Coumadin consult" is placed.  DESCRIPTION:  While hospitalized, to be in compliance with [facility] Patient Safety Goals, all patients on warfarin must have a baseline and/or current protime prior to the administration of warfarin. Pharmacy has received your order for warfarin without these required laboratory assessments.   Vanessa [place], PharmD, BCPS Pager: [phone] 8:10 AM Pharmacy #: [phone]

## 2012-05-20 LAB — GLUCOSE, CAPILLARY
Glucose-Capillary: 89 mg/dL (ref 70–99)
Glucose-Capillary: 135 mg/dL — ABNORMAL HIGH (ref 70–99)

## 2012-05-20 LAB — PROTIME-INR
INR: 1.13 (ref 0.00–1.49)
Prothrombin Time: 14.3 seconds (ref 11.6–15.2)

## 2012-05-20 MED ORDER — PNEUMOCOCCAL VAC POLYVALENT 25 MCG/0.5ML IJ INJ
0.5000 mL | INJECTION | Freq: Once | INTRAMUSCULAR | Status: AC
  Administered 2012-05-20: 0.5 mL via INTRAMUSCULAR
  Filled 2012-05-20: qty 0.5

## 2012-05-20 MED ORDER — HYDROCODONE-ACETAMINOPHEN 5-325 MG PO TABS: 1.0000 | ORAL_TABLET | Freq: Four times a day (QID) | ORAL | Status: DC | PRN

## 2012-05-20 NOTE — Discharge Summary (Signed)
Physician Discharge Summary  Patient ID: Sara Hancock MRN: [id number] DOB/AGE: 07/01/1934 78 y.o.  Patient Care Team: [name] as PCP - General (Family Medicine) [name], MD as Consulting Physician (Gastroenterology) [name], MD as Consulting Physician (Cardiology)   Admit date: 05/18/2012 Discharge date: 05/20/2012  Admission Diagnoses: Principal Problem:  *Incarcerated ventral hernia, periumbilical Active Problems:  BILIARY CIRRHOSIS, PRIMARY  Anticoagulant long-term use  Obesity (BMI 30-39.9)  Discharge Diagnoses:  Principal Problem:  *Incarcerated ventral hernia, periumbilical Active Problems:  BILIARY CIRRHOSIS, PRIMARY  Anticoagulant long-term use  Obesity (BMI 30-39.9)   Discharged Condition: good  Hospital Course:   The patient underwent surgery for her hernia & cirrhosis.  Postoperatively, the patient mobilized and advanced to a solid diet gradually.  Pain was well-controlled and transitioned off IV medications.    By the time of discharge, the patient was walking well the hallways, eating food well, no nausea.  Pain was-controlled on an oral regimen.  Based on meeting DC criteria and recovering well, I felt it was safe for the patient to be discharged home with close followup.  Instructions were discussed in detail.  They are written as well.     Consults: None  Significant Diagnostic Studies:   Treatments: surgery: Lap [place] hernia repair w mesh & core needle liver biopsy  Discharge Exam: Blood pressure 130/61, pulse 72, temperature 98 F (36.7 C), temperature source Oral, resp. rate 16, height 5' 2.5" (1.588 m), weight 180 lb (81.647 kg), SpO2 98.00%.  General: Pt awake/alert/oriented x4 in no major acute distress.  RN & husband in room Eyes: PERRL, normal EOM. Sclera nonicteric Neuro: CN II-XII intact w/o focal sensory/motor deficits. Lymph: No head/neck/groin lymphadenopathy Psych:  No delerium/psychosis/paranoia HENT:  Normocephalic, Mucus membranes moist.  No thrush Neck: Supple, No tracheal deviation Chest: No pain.  Good respiratory excursion. CV:  Pulses intact.  Regular rhythm MS: Normal AROM mjr joints.  No obvious deformity Abdomen: Soft, Nondistended.  Obese.  Mildly tender at incisions only.  No incarcerated hernias. Ext:  SCDs BLE.  No significant edema.  No cyanosis Skin: No petechiae / purpurae   Disposition: Final discharge disposition not confirmed  Discharge Orders    Future Appointments: Provider: Department: Dept Phone: Center:   05/24/2012 11:45 AM [facility] [phone] None     Future Orders Please Complete By Expires   Diet - low sodium heart healthy      Diet - low sodium heart healthy      Increase activity slowly      Increase activity slowly          Medication List     As of 05/20/2012  9:48 AM    TAKE these medications         calcium-vitamin D 250-125 MG-UNIT per tablet   Commonly known as: OSCAL WITH D   Take 1 tablet by mouth daily.      fexofenadine 180 MG tablet   Commonly known as: ALLEGRA   Take 180 mg by mouth daily.      folic acid A999333 MCG tablet   Commonly known as: FOLVITE   Take 400 mcg by mouth daily.      furosemide 40 MG tablet   Commonly known as: LASIX   Take 60 mg by mouth every morning.      GLUCOPHAGE 1000 MG tablet   Generic drug: metFORMIN   Take 1,000 mg by mouth 2 (two) times daily with a  meal.      hydrALAZINE 25 MG tablet   Commonly known as: APRESOLINE   Take 25 mg by mouth 2 (two) times daily.      HYDROcodone-acetaminophen 5-325 MG per tablet   Commonly known as: NORCO/VICODIN   Take 1-2 tablets by mouth every 6 (six) hours as needed for pain.      irbesartan 300 MG tablet   Commonly known as: AVAPRO   Take 300 mg by mouth every evening.      ketoconazole 2 % cream   Commonly known as: NIZORAL   Apply 1 application topically daily as needed. Around skin folds for  irritation      LEVEMIR FLEXPEN 100 UNIT/ML injection   Generic drug: insulin detemir   Inject 16 Units into the skin at bedtime.      metoprolol 50 MG tablet   Commonly known as: LOPRESSOR   Take 25-50 mg by mouth 2 (two) times daily. Take 25 mg in the morning and 50 mg at night      multivitamin with minerals Tabs   Take 1 tablet by mouth daily.      pantoprazole 40 MG tablet   Commonly known as: PROTONIX   Take 40 mg by mouth daily.      propafenone 325 MG 12 hr capsule   Commonly known as: RYTHMOL SR   Take 1 capsule (325 mg total) by mouth 2 (two) times daily.      SE-TAN PLUS 162-115.2-1 MG Caps   Take 1 capsule by mouth daily.      simvastatin 40 MG tablet   Commonly known as: ZOCOR   Take 40 mg by mouth every evening.      sitaGLIPtin 100 MG tablet   Commonly known as: JANUVIA   Take 100 mg by mouth at bedtime.      ursodiol 300 MG capsule   Commonly known as: ACTIGALL   Take 300 mg by mouth daily.      ursodiol 300 MG capsule   Commonly known as: ACTIGALL   TAKE 1 CAPSULE TWICE DAILY      vitamin C 500 MG tablet   Commonly known as: ASCORBIC ACID   Take 500 mg by mouth daily.      vitamin E 400 UNIT capsule   Take 400 Units by mouth daily.      warfarin 5 MG tablet   Commonly known as: COUMADIN   TAKE AS DIRECTED BY COUMADIN CLINIC      warfarin 5 MG tablet   Commonly known as: COUMADIN   Take 2.5-5 mg by mouth daily. Take 2.5 mg on Mondays and fridays, and take 5 mg on all other days       Follow-up Information    Follow up with Chuck Caban C., MD. In 3 weeks.   Contact information:   [address] [place] [address] [phone]          Signed: Adin Hector. 05/20/2012, 9:48 AM

## 2012-05-20 NOTE — Progress Notes (Signed)
Spoke to Dr. Johney Maine states fine to give pneumonia shot

## 2012-05-20 NOTE — Progress Notes (Signed)
Patient discharged via wheelchair. rx for vicodin given. States understanding of discharge instructions.

## 2012-05-20 NOTE — Care Management Note (Signed)
    Page 1 of 1   05/20/2012     1:21:10 PM   CARE MANAGEMENT NOTE 05/20/2012  Patient:  Sara Hancock, Sara Hancock   Account Number:  [id number]  Date Initiated:  05/20/2012  Documentation initiated by:  [facility]  Subjective/Objective Assessment:     Action/Plan:   Anticipated DC Date:  05/20/2012   Anticipated DC Plan:  HOME/SELF CARE         Choice offered to / List presented to:             Status of service:  Completed, signed off Medicare Important Message given?   (If response is "NO", the following Medicare IM given date fields will be blank) Date Medicare IM given:   Date Additional Medicare IM given:    Discharge Disposition:  HOME/SELF CARE  Per UR Regulation:  Reviewed for med. necessity/level of care/duration of stay  If discussed at [place] of Stay Meetings, dates discussed:    Comments:

## 2012-05-24 ENCOUNTER — Ambulatory Visit (INDEPENDENT_AMBULATORY_CARE_PROVIDER_SITE_OTHER): Payer: Medicare Other | Admitting: *Deleted

## 2012-05-24 ENCOUNTER — Telehealth (INDEPENDENT_AMBULATORY_CARE_PROVIDER_SITE_OTHER): Payer: Self-pay | Admitting: General Surgery

## 2012-05-24 DIAGNOSIS — I4891 Unspecified atrial fibrillation: Secondary | ICD-10-CM

## 2012-05-24 DIAGNOSIS — Z7901 Long term (current) use of anticoagulants: Secondary | ICD-10-CM

## 2012-05-24 LAB — POCT INR: INR: 3.8

## 2012-05-24 NOTE — Telephone Encounter (Signed)
Pt called to be "walked-through" removing her bandages at home.  She removed them and we went over care and showering.  All questions answered.

## 2012-05-25 ENCOUNTER — Telehealth (INDEPENDENT_AMBULATORY_CARE_PROVIDER_SITE_OTHER): Payer: Self-pay | Admitting: General Surgery

## 2012-05-25 NOTE — Telephone Encounter (Signed)
Pt called to ask if bruising at umbilicus is expected following laproscopic surgery.  Reassured pt and instructed her to use ice pack to site for swelling.  She understands.

## 2012-06-08 ENCOUNTER — Other Ambulatory Visit: Payer: Self-pay | Admitting: Gastroenterology

## 2012-06-10 ENCOUNTER — Ambulatory Visit (INDEPENDENT_AMBULATORY_CARE_PROVIDER_SITE_OTHER): Payer: Medicare Other

## 2012-06-10 DIAGNOSIS — I4891 Unspecified atrial fibrillation: Secondary | ICD-10-CM

## 2012-06-10 DIAGNOSIS — Z7901 Long term (current) use of anticoagulants: Secondary | ICD-10-CM

## 2012-06-10 LAB — POCT INR: INR: 4.3

## 2012-06-14 ENCOUNTER — Encounter (INDEPENDENT_AMBULATORY_CARE_PROVIDER_SITE_OTHER): Payer: Self-pay | Admitting: Surgery

## 2012-06-14 ENCOUNTER — Ambulatory Visit (INDEPENDENT_AMBULATORY_CARE_PROVIDER_SITE_OTHER): Payer: Medicare Other | Admitting: Surgery

## 2012-06-14 VITALS — BP 124/82 | HR 70 | Temp 97.4°F | Resp 16 | Ht 62.5 in | Wt 183.2 lb

## 2012-06-14 DIAGNOSIS — E669 Obesity, unspecified: Secondary | ICD-10-CM

## 2012-06-14 DIAGNOSIS — K436 Other and unspecified ventral hernia with obstruction, without gangrene: Secondary | ICD-10-CM

## 2012-06-14 DIAGNOSIS — K745 Biliary cirrhosis, unspecified: Secondary | ICD-10-CM

## 2012-06-14 NOTE — Progress Notes (Signed)
Subjective:     Patient ID: Sara Hancock, female   DOB: [birth year], 78 y.o.   MRN: [id number]  HPI  Sara Hancock [id number]  1934-08-08 [id number]  Patient Care Team: Sara Hancock as PCP - General (Family Medicine) Sara Feil, MD as Consulting Physician (Gastroenterology) Sara Sprang, MD as Consulting Physician (Cardiology)  This patient is a 78 y.o.female who presents today for surgical evaluation status post liver biopsy and repair of incarcerated ventral hernia.   POST-OPERATIVE DIAGNOSIS:  incarcerated ventral wall hernia  cirrhosis  PROCEDURE: Procedure(s):  LAPAROSCOPIC VENTRAL HERNIA  INSERTION OF MESH  LIVER BIOPSY   Diagnosis Liver, needle/core biopsy CHRONIC ACTIVE HEPATITIS, CONSISTENT WITH PRIMARY BILIARY CIRRHOSIS. PLEASE SEE COMMENT.-  Microscopic Comment Three core biopsies are available for evaluation. Sections show inflamed bile ducts and patchy florid duct lesions associated with disruption of the bile duct base membrane, the presence of intraepithelial lymphocytes and plasma cells, and scattered granulomas. A few eosinophils are also present. There is prominent interface activity and mild to moderate activity in the lobules. In addition, there are scattered clusters of hepatocytes with prominent ballooning changes, associated with glycogenated nuclei and perisinusoidal fibrosis. Trichrome and reticulin stains demonstrate periportal fibrosis with focal bridging fibrosis and perisinusoidal fibrosis. Iron stain shows no evidence of iron deposition. The overall findings are diagnostic for primary biliary cirrhosis, inflammation grade 2, and fibrosis stage 2-3. The presence of patchy hepatocyte ballooning and prominent perisinusoidal fibrosis is suggestive of superimposed steatohepatitis. Clinical and serologic correlation is highly recommended. Case was discussed with Dr. Neysa Hancock on 05/20/2012. Sara Hancock has reviewed this case and agrees with the  above interpretation. (HCL:eps 05/20/12) Sara Bar MD Pathologist, Electronic Signature (Case signed 05/20/2012)  The patient comes in today feeling well.  Soreness has gradually faded away.  Off all medications for pain.  No fevers or chills.  Energy level good.  Walking well.  No fevers or chills.  Notices a few scabs at the incisions but no drainage.  Regular bowel movements.   Patient Active Problem List  Diagnosis  . DIABETES MELLITUS  . HYPERCHOLESTEROLEMIA  . ANEMIA, NORMOCYTIC  . HYPERTENSION  . AV BLOCK, 1ST DEGREE  . ATRIAL FIBRILLATION, PAROXYSMAL  . BRADYCARDIA  . DIVERTICULOSIS, COLON, WITH HEMORRHAGE  . BILIARY CIRRHOSIS, PRIMARY  . GALLSTONES  . COLONIC POLYPS, HX OF  . Incarcerated ventral hernia, periumbilical  . Iron deficiency anemia, unspecified  . Anticoagulant long-term use  . Benign neoplasm of colon  . Gastritis, erosive chronic  . Esophageal reflux  . Paraesophageal hiatal hernia, 3cm by EGD  . Obesity (BMI 30-39.9)    Past Medical History  Diagnosis Date  . Type II or unspecified type diabetes mellitus without mention of complication, not stated as uncontrolled   . Pure hypercholesterolemia   . HTN (hypertension)   . Anemia   . Biliary cirrhosis   . Diverticulosis of colon with hemorrhage 06/08/2000  . Esophageal stenosis 01/08/11  . Hiatal hernia 01/08/11  . Esophagitis 01/08/11  . Hyperplastic colon polyp 06/08/2000, 07/31/2003, 01/08/11  . Hx of adenomatous colonic polyps 01/08/11  . GERD (gastroesophageal reflux disease)   . Osteopenia   . Cholelithiasis 01/09/12  . Uterine fibroid 01/09/12  . High risk medication use     on Rythmol  . Obesity   . PAF (paroxysmal atrial fibrillation)     occurring in the setting of bradycardia and mild first degree AV block followed by Dr. Jens Hancock  . Arthritis  Past Surgical History  Procedure Date  . Thyroidectomy, partial 1980  . Tubal ligation 1972  . Breast cyst excision     left  .  Colonoscopy   . Polypectomy   . Ventral hernia repair 05/18/2012    Procedure: LAPAROSCOPIC VENTRAL HERNIA;  Surgeon: Sara Hector, MD;  Location: [facility];  Service: General;  Laterality: N/A;  Laparoscopic Ventral Wall Hernia with Mesh  . Insertion of mesh 05/18/2012    Procedure: INSERTION OF MESH;  Surgeon: Sara Hector, MD;  Location: [facility];  Service: General;  Laterality: N/A;  . Liver biopsy 05/18/2012    Procedure: LIVER BIOPSY;  Surgeon: Sara Hector, MD;  Location: [facility];  Service: General;;    History   Social History  . Marital Status: Married    Spouse Name: N/A    Number of Children: 2  . Years of Education: N/A   Occupational History  . retired    Social History Main Topics  . Smoking status: Never Smoker   . Smokeless tobacco: Never Used  . Alcohol Use: No  . Drug Use: No  . Sexually Active: Not Currently   Other Topics Concern  . Not on file   Social History Narrative  . No narrative on file    Family History  Problem Relation Age of Onset  . Diabetes Sister     x 2  . Diabetes Brother   . Colon cancer Neg Hx   . Heart attack Father   . Stroke Mother     Current Outpatient Prescriptions  Medication Sig Dispense Refill  . calcium-vitamin D (OSCAL WITH D) 250-125 MG-UNIT per tablet Take 1 tablet by mouth daily.      Marland Kitchen FeFum-FePo-FA-B Cmp-C-Zn-Mn-Cu (SE-TAN PLUS) 162-115.2-1 MG CAPS TAKE ONE CAPSULE BY MOUTH EVERY DAY  30 capsule  2  . fexofenadine (ALLEGRA) 180 MG tablet Take 180 mg by mouth daily.       . folic acid (FOLVITE) A999333 MCG tablet Take 400 mcg by mouth daily.      . furosemide (LASIX) 40 MG tablet Take 60 mg by mouth every Hancock.       . hydrALAZINE (APRESOLINE) 25 MG tablet Take 25 mg by mouth 2 (two) times daily.      . insulin detemir (LEVEMIR FLEXPEN) 100 UNIT/ML injection Inject 16 Units into the skin at bedtime.      . irbesartan (AVAPRO) 300 MG tablet Take 300 mg by mouth every evening.      Marland Kitchen ketoconazole (NIZORAL) 2 %  cream Apply 1 application topically daily as needed. Around skin folds for irritation      . metFORMIN (GLUCOPHAGE) 1000 MG tablet Take 1,000 mg by mouth 2 (two) times daily with a meal.       . metoprolol (LOPRESSOR) 50 MG tablet Take 25-50 mg by mouth 2 (two) times daily. Take 25 mg in the Hancock and 50 mg at night      . Multiple Vitamin (MULTIVITAMIN WITH MINERALS) TABS Take 1 tablet by mouth daily.      . pantoprazole (PROTONIX) 40 MG tablet Take 40 mg by mouth daily.      . propafenone (RYTHMOL SR) 325 MG 12 hr capsule Take 1 capsule (325 mg total) by mouth 2 (two) times daily.  180 capsule  3  . simvastatin (ZOCOR) 40 MG tablet Take 40 mg by mouth every evening.       . sitaGLIPtin (JANUVIA) 100 MG tablet Take 100  mg by mouth at bedtime.       . ursodiol (ACTIGALL) 300 MG capsule TAKE 1 CAPSULE TWICE DAILY  180 capsule  0  . vitamin C (ASCORBIC ACID) 500 MG tablet Take 500 mg by mouth daily.      . vitamin E 400 UNIT capsule Take 400 Units by mouth daily.      Marland Kitchen warfarin (COUMADIN) 5 MG tablet Take 2.5-5 mg by mouth daily. Take 2.5 mg on Monday, Wednesday and Friday, and take 5 mg on all other days.         Allergies  Allergen Reactions  . Ace Inhibitors Anaphylaxis  . Capoten (Captopril) Anaphylaxis    Throat swells shut  . Neomycin-Bacitracin Zn-Polymyx Rash    BP 124/82  Pulse 70  Temp 97.4 F (36.3 C) (Temporal)  Resp 16  Ht 5' 2.5" (1.588 m)  Wt 183 lb 3.2 oz (83.099 kg)  BMI 32.97 kg/m2  No results found.   Review of Systems  Constitutional: Negative for fever, chills and diaphoresis.  HENT: Negative for ear pain, sore throat and trouble swallowing.   Eyes: Negative for photophobia and visual disturbance.  Respiratory: Negative for cough and choking.   Cardiovascular: Negative for chest pain and palpitations.  Gastrointestinal: Negative for nausea, vomiting, abdominal pain, diarrhea, constipation, anal bleeding and rectal pain.  Genitourinary: Negative for  dysuria, frequency and difficulty urinating.  Musculoskeletal: Negative for myalgias and gait problem.  Skin: Negative for color change, pallor and rash.  Neurological: Negative for dizziness, speech difficulty, weakness and numbness.  Hematological: Negative for adenopathy.  Psychiatric/Behavioral: Negative for confusion and agitation. The patient is not nervous/anxious.        Objective:   Physical Exam  Constitutional: She is oriented to person, place, and time. She appears well-developed and well-nourished. No distress.  HENT:  Head: Normocephalic.  Mouth/Throat: Oropharynx is clear and moist. No oropharyngeal exudate.  Eyes: Conjunctivae normal and EOM are normal. Pupils are equal, round, and reactive to light. No scleral icterus.  Neck: Normal range of motion. No tracheal deviation present.  Cardiovascular: Normal rate and intact distal pulses.   Pulmonary/Chest: Effort normal. No respiratory distress. She exhibits no tenderness.  Abdominal: Soft. She exhibits no distension. There is no tenderness. Hernia confirmed negative in the right inguinal area and confirmed negative in the left inguinal area.       Incisions clean with normal healing ridges.  No hernias  Genitourinary: No vaginal discharge found.  Musculoskeletal: Normal range of motion. She exhibits no tenderness.  Lymphadenopathy:       Right: No inguinal adenopathy present.       Left: No inguinal adenopathy present.  Neurological: She is alert and oriented to person, place, and time. No cranial nerve deficit. She exhibits normal muscle tone. Coordination normal.  Skin: Skin is warm and dry. No rash noted. She is not diaphoretic.  Psychiatric: She has a normal mood and affect. Her behavior is normal.       Assessment:     Recovering well status post laparoscopic repair of incarcerated periumbilical ventral hernia in the setting of cirrhosis.    Plan:     Increase activity as tolerated to regular activity.  Do not  push through pain.  Diet as tolerated. Bowel regimen to avoid problems.  Return to clinic p.r.n.   Instructions discussed.  Followup with primary care physician for other health issues as would normally be done.  Questions answered.  The patient expressed understanding and appreciation

## 2012-06-14 NOTE — Patient Instructions (Signed)
Managing Pain  Pain after surgery or related to activity is often due to strain/injury to muscle, tendon, nerves and/or incisions.  This pain is usually short-term and will improve in a few months.   Many people find it helpful to do the following things TOGETHER to help speed the process of healing and to get back to regular activity more quickly:  1. Avoid heavy physical activity a.  no lifting greater than 20 pounds b. Do not "push through" the pain.  Listen to your body and avoid positions and maneuvers than reproduce the pain c. Walking is okay as tolerated, but go slowly and stop when getting sore.  d. Remember: If it hurts to do it, then don't do it! 2. Take Anti-inflammatory medication  a. Take with food/snack around the clock for 1-2 weeks i. This helps the muscle and nerve tissues become less irritable and calm down faster b. Choose ONE of the following over-the-counter medications: i. Naproxen 220mg  tabs (ex. Aleve) 1-2 pills twice a day  ii. Ibuprofen 200mg  tabs (ex. Advil, Motrin) 3-4 pills with every meal and just before bedtime iii. Acetaminophen 500mg  tabs (Tylenol) 1-2 pills with every meal and just before bedtime 3. Use a Heating pad or Ice/Cold Pack a. 4-6 times a day b. May use warm bath/hottub  or showers 4. Try Gentle Massage and/or Stretching  a. at the area of pain many times a day b. stop if you feel pain - do not overdo it  Try these steps together to help you body heal faster and avoid making things get worse.  Doing just one of these things may not be enough.    If you are not getting better after two weeks or are noticing you are getting worse, contact our office for further advice; we may need to re-evaluate you & see what other things we can do to help.  GETTING TO GOOD BOWEL HEALTH. Irregular bowel habits such as constipation and diarrhea can lead to many problems over time.  Having one soft bowel movement a day is the most important way to prevent  further problems.  The anorectal canal is designed to handle stretching and feces to safely manage our ability to get rid of solid waste (feces, poop, stool) out of our body.  BUT, hard constipated stools can act like ripping concrete bricks and diarrhea can be a burning fire to this very sensitive area of our body, causing inflamed hemorrhoids, anal fissures, increasing risk is perirectal abscesses, abdominal pain/bloating, an making irritable bowel worse.     The goal: ONE SOFT BOWEL MOVEMENT A DAY!  To have soft, regular bowel movements:    Drink at least 8 tall glasses of water a day.     Take plenty of fiber.  Fiber is the undigested part of plant food that passes into the colon, acting s "natures broom" to encourage bowel motility and movement.  Fiber can absorb and hold large amounts of water. This results in a larger, bulkier stool, which is soft and easier to pass. Work gradually over several weeks up to 6 servings a day of fiber (25g a day even more if needed) in the form of: o Vegetables -- Root (potatoes, carrots, turnips), leafy green (lettuce, salad greens, celery, spinach), or cooked high residue (cabbage, broccoli, etc) o Fruit -- Fresh (unpeeled skin & pulp), Dried (prunes, apricots, cherries, etc ),  or stewed ( applesauce)  o Whole grain breads, pasta, etc (whole wheat)  o Bran cereals  Bulking Agents -- This type of water-retaining fiber generally is easily obtained each day by one of the following:  o Psyllium bran -- The psyllium plant is remarkable because its ground seeds can retain so much water. This product is available as Metamucil, Konsyl, Effersyllium, Per Diem Fiber, or the less expensive generic preparation in drug and health food stores. Although labeled a laxative, it really is not a laxative.  o Methylcellulose -- This is another fiber derived from wood which also retains water. It is available as Citrucel. o Polyethylene Glycol - and "artificial" fiber commonly called  Miralax or Glycolax.  It is helpful for people with gassy or bloated feelings with regular fiber o Flax Seed - a less gassy fiber than psyllium   No reading or other relaxing activity while on the toilet. If bowel movements take longer than 5 minutes, you are too constipated   AVOID CONSTIPATION.  High fiber and water intake usually takes care of this.  Sometimes a laxative is needed to stimulate more frequent bowel movements, but    Laxatives are not a good long-term solution as it can wear the colon out. o Osmotics (Milk of Magnesia, Fleets phosphosoda, Magnesium citrate, MiraLax, GoLytely) are safer than  o Stimulants (Senokot, Castor Oil, Dulcolax, Ex Lax)    o Do not take laxatives for more than 7days in a row.    IF SEVERELY CONSTIPATED, try a Bowel Retraining Program: o Do not use laxatives.  o Eat a diet high in roughage, such as bran cereals and leafy vegetables.  o Drink six (6) ounces of prune or apricot juice each morning.  o Eat two (2) large servings of stewed fruit each day.  o Take one (1) heaping tablespoon of a psyllium-based bulking agent twice a day. Use sugar-free sweetener when possible to avoid excessive calories.  o Eat a normal breakfast.  o Set aside 15 minutes after breakfast to sit on the toilet, but do not strain to have a bowel movement.  o If you do not have a bowel movement by the third day, use an enema and repeat the above steps.    Controlling diarrhea o Switch to liquids and simpler foods for a few days to avoid stressing your intestines further. o Avoid dairy products (especially milk & ice cream) for a short time.  The intestines often can lose the ability to digest lactose when stressed. o Avoid foods that cause gassiness or bloating.  Typical foods include beans and other legumes, cabbage, broccoli, and dairy foods.  Every person has some sensitivity to other foods, so listen to our body and avoid those foods that trigger problems for you. o Adding fiber  (Citrucel, Metamucil, psyllium, Miralax) gradually can help thicken stools by absorbing excess fluid and retrain the intestines to act more normally.  Slowly increase the dose over a few weeks.  Too much fiber too soon can backfire and cause cramping & bloating. o Probiotics (such as active yogurt, Align, etc) may help repopulate the intestines and colon with normal bacteria and calm down a sensitive digestive tract.  Most studies show it to be of mild help, though, and such products can be costly. o Medicines:   Bismuth subsalicylate (ex. Kayopectate, Pepto Bismol) every 30 minutes for up to 6 doses can help control diarrhea.  Avoid if pregnant.   Loperamide (Immodium) can slow down diarrhea.  Start with two tablets (4mg  total) first and then try one tablet every 6 hours.  Avoid if you  are having fevers or severe pain.  If you are not better or start feeling worse, stop all medicines and call your doctor for advice o Call your doctor if you are getting worse or not better.  Sometimes further testing (cultures, endoscopy, X-ray studies, bloodwork, etc) may be needed to help diagnose and treat the cause of the diarrhea. o

## 2012-06-15 ENCOUNTER — Telehealth (INDEPENDENT_AMBULATORY_CARE_PROVIDER_SITE_OTHER): Payer: Self-pay | Admitting: *Deleted

## 2012-06-15 NOTE — Telephone Encounter (Signed)
Patient called concerned following speaking to the Coumadin clinic regarding taking Aspirin and Aleve.  Patient was informed by Coumadin clinic that it was not their recommendation for her to take the Aspirin and Aleve.  Advised patient that the medications were for PRN use and for her to try Tylenol instead of the NSAIDs.  Patient states understanding and POC at this time.

## 2012-06-22 ENCOUNTER — Ambulatory Visit (INDEPENDENT_AMBULATORY_CARE_PROVIDER_SITE_OTHER): Payer: Medicare Other | Admitting: *Deleted

## 2012-06-22 DIAGNOSIS — I4891 Unspecified atrial fibrillation: Secondary | ICD-10-CM

## 2012-06-22 DIAGNOSIS — Z7901 Long term (current) use of anticoagulants: Secondary | ICD-10-CM

## 2012-06-22 LAB — POCT INR: INR: 3

## 2012-07-15 ENCOUNTER — Ambulatory Visit (INDEPENDENT_AMBULATORY_CARE_PROVIDER_SITE_OTHER): Payer: Medicare Other | Admitting: *Deleted

## 2012-07-15 DIAGNOSIS — I4891 Unspecified atrial fibrillation: Secondary | ICD-10-CM

## 2012-07-15 DIAGNOSIS — Z7901 Long term (current) use of anticoagulants: Secondary | ICD-10-CM

## 2012-07-15 LAB — POCT INR: INR: 3.1

## 2012-08-02 ENCOUNTER — Ambulatory Visit (INDEPENDENT_AMBULATORY_CARE_PROVIDER_SITE_OTHER): Payer: Medicare Other | Admitting: *Deleted

## 2012-08-02 DIAGNOSIS — Z7901 Long term (current) use of anticoagulants: Secondary | ICD-10-CM

## 2012-08-02 DIAGNOSIS — I4891 Unspecified atrial fibrillation: Secondary | ICD-10-CM

## 2012-08-02 LAB — POCT INR: INR: 2.5

## 2012-09-01 ENCOUNTER — Ambulatory Visit (INDEPENDENT_AMBULATORY_CARE_PROVIDER_SITE_OTHER): Payer: Medicare Other | Admitting: *Deleted

## 2012-09-01 DIAGNOSIS — I4891 Unspecified atrial fibrillation: Secondary | ICD-10-CM

## 2012-09-01 DIAGNOSIS — Z7901 Long term (current) use of anticoagulants: Secondary | ICD-10-CM

## 2012-09-01 LAB — POCT INR: INR: 1.7

## 2012-09-06 ENCOUNTER — Other Ambulatory Visit: Payer: Self-pay | Admitting: Gastroenterology

## 2012-09-16 ENCOUNTER — Ambulatory Visit (INDEPENDENT_AMBULATORY_CARE_PROVIDER_SITE_OTHER): Payer: Medicare Other

## 2012-09-16 DIAGNOSIS — Z7901 Long term (current) use of anticoagulants: Secondary | ICD-10-CM

## 2012-09-16 DIAGNOSIS — I4891 Unspecified atrial fibrillation: Secondary | ICD-10-CM

## 2012-09-16 LAB — POCT INR: INR: 2.2

## 2012-10-07 ENCOUNTER — Ambulatory Visit (INDEPENDENT_AMBULATORY_CARE_PROVIDER_SITE_OTHER): Payer: Medicare Other | Admitting: *Deleted

## 2012-10-07 DIAGNOSIS — I4891 Unspecified atrial fibrillation: Secondary | ICD-10-CM

## 2012-10-07 DIAGNOSIS — Z7901 Long term (current) use of anticoagulants: Secondary | ICD-10-CM

## 2012-10-07 LAB — POCT INR: INR: 1.8

## 2012-10-27 ENCOUNTER — Other Ambulatory Visit: Payer: Self-pay | Admitting: Gastroenterology

## 2012-10-28 ENCOUNTER — Ambulatory Visit (INDEPENDENT_AMBULATORY_CARE_PROVIDER_SITE_OTHER): Payer: Medicare Other | Admitting: Pharmacist

## 2012-10-28 ENCOUNTER — Other Ambulatory Visit (HOSPITAL_COMMUNITY): Payer: Self-pay | Admitting: Family Medicine

## 2012-10-28 DIAGNOSIS — Z1231 Encounter for screening mammogram for malignant neoplasm of breast: Secondary | ICD-10-CM

## 2012-10-28 DIAGNOSIS — I4891 Unspecified atrial fibrillation: Secondary | ICD-10-CM

## 2012-10-28 DIAGNOSIS — Z7901 Long term (current) use of anticoagulants: Secondary | ICD-10-CM

## 2012-10-28 LAB — POCT INR: INR: 1.6

## 2012-11-18 ENCOUNTER — Ambulatory Visit (INDEPENDENT_AMBULATORY_CARE_PROVIDER_SITE_OTHER): Payer: Medicare Other | Admitting: *Deleted

## 2012-11-18 DIAGNOSIS — I4891 Unspecified atrial fibrillation: Secondary | ICD-10-CM

## 2012-11-18 DIAGNOSIS — Z7901 Long term (current) use of anticoagulants: Secondary | ICD-10-CM

## 2012-11-18 LAB — POCT INR: INR: 2.9

## 2012-12-13 ENCOUNTER — Telehealth: Payer: Self-pay | Admitting: Gastroenterology

## 2012-12-13 DIAGNOSIS — R6889 Other general symptoms and signs: Secondary | ICD-10-CM

## 2012-12-13 DIAGNOSIS — D649 Anemia, unspecified: Secondary | ICD-10-CM

## 2012-12-13 NOTE — Telephone Encounter (Signed)
Put lab orders in Left message with patient's husband to have patient call the office back

## 2012-12-13 NOTE — Telephone Encounter (Signed)
ok 

## 2012-12-13 NOTE — Telephone Encounter (Signed)
Patient has an appointment with you on 01-20-13 and wants to know if she can get lab work done before she comes in like she did last year. Labs ordered last year was Anemia Panel and [place] Panel, is this ok to order?

## 2012-12-16 ENCOUNTER — Ambulatory Visit (INDEPENDENT_AMBULATORY_CARE_PROVIDER_SITE_OTHER): Payer: Medicare Other | Admitting: *Deleted

## 2012-12-16 DIAGNOSIS — I4891 Unspecified atrial fibrillation: Secondary | ICD-10-CM

## 2012-12-16 DIAGNOSIS — Z7901 Long term (current) use of anticoagulants: Secondary | ICD-10-CM

## 2012-12-16 LAB — POCT INR: INR: 2.7

## 2013-01-06 ENCOUNTER — Other Ambulatory Visit (HOSPITAL_COMMUNITY): Payer: Self-pay | Admitting: Family Medicine

## 2013-01-06 ENCOUNTER — Ambulatory Visit (HOSPITAL_COMMUNITY)
Admission: RE | Admit: 2013-01-06 | Discharge: 2013-01-06 | Disposition: A | Discharge status: Home / Self Care | Payer: Medicare Other | Source: Ambulatory Visit | Attending: Family Medicine | Admitting: Family Medicine

## 2013-01-06 ENCOUNTER — Encounter: Payer: Self-pay | Admitting: Internal Medicine

## 2013-01-06 ENCOUNTER — Ambulatory Visit (INDEPENDENT_AMBULATORY_CARE_PROVIDER_SITE_OTHER): Payer: Medicare Other | Admitting: *Deleted

## 2013-01-06 ENCOUNTER — Ambulatory Visit (INDEPENDENT_AMBULATORY_CARE_PROVIDER_SITE_OTHER): Payer: Medicare Other | Admitting: Internal Medicine

## 2013-01-06 VITALS — BP 125/83 | HR 71 | Ht 64.0 in | Wt 183.0 lb

## 2013-01-06 DIAGNOSIS — I4891 Unspecified atrial fibrillation: Secondary | ICD-10-CM

## 2013-01-06 DIAGNOSIS — Z7901 Long term (current) use of anticoagulants: Secondary | ICD-10-CM

## 2013-01-06 DIAGNOSIS — I10 Essential (primary) hypertension: Secondary | ICD-10-CM

## 2013-01-06 DIAGNOSIS — Z1231 Encounter for screening mammogram for malignant neoplasm of breast: Secondary | ICD-10-CM

## 2013-01-06 DIAGNOSIS — Z0000 Encounter for general adult medical examination without abnormal findings: Secondary | ICD-10-CM

## 2013-01-06 DIAGNOSIS — E119 Type 2 diabetes mellitus without complications: Secondary | ICD-10-CM

## 2013-01-06 DIAGNOSIS — I498 Other specified cardiac arrhythmias: Secondary | ICD-10-CM

## 2013-01-06 LAB — POCT INR: INR: 2.5

## 2013-01-06 MED ORDER — PROPAFENONE HCL ER 325 MG PO CP12: 325.0000 mg | ORAL_CAPSULE | Freq: Two times a day (BID) | ORAL | Status: DC

## 2013-01-06 MED ORDER — WARFARIN SODIUM 5 MG PO TABS: 2.5000 mg | ORAL_TABLET | Freq: Every day | ORAL | Status: DC

## 2013-01-06 NOTE — Patient Instructions (Addendum)
Your physician wants you to follow-up in: one year with Dr. Caryl Comes.  You will receive a reminder letter in the mail two months in advance. If you don't receive a letter, please call our office to schedule the follow-up appointment.  Your physician recommends that you continue on your current medications as directed. Please refer to the Current Medication list given to you today.

## 2013-01-06 NOTE — Assessment & Plan Note (Signed)
Stable on current dose of Rythmol

## 2013-01-06 NOTE — Assessment & Plan Note (Signed)
We discussed the alternative use of NOACs; she would like to continue her

## 2013-01-06 NOTE — Assessment & Plan Note (Signed)
With recent data, I wonder whether it is worth considering discontinuing her hydralazine and laid her blood pressure. 120 range---140 range  I will defer this to PCP

## 2013-01-06 NOTE — Assessment & Plan Note (Signed)
Holding sinus rhythm. 

## 2013-01-06 NOTE — Progress Notes (Signed)
Patient Care Team: [name] as PCP - General (Family Medicine) [name], MD as Consulting Physician (Gastroenterology) [name], MD as Consulting Physician (Cardiology)   HPI  Sara Hancock is a 78 y.o. female seen in followup for atrial fibrillation that is paroxysmal. It occurs in the setting of background bradycardia and mild first-degree AV block. She is doing quite well without recurrent tachypalpitations.  She is tolerating [name] without complaint  She denies exercise intolerance chest pain or shortness of breath ; has occasional peripheral edema   Past Medical History  Diagnosis Date  . Type II or unspecified type diabetes mellitus without mention of complication, not stated as uncontrolled   . Pure hypercholesterolemia   . HTN (hypertension)   . Anemia   . Biliary cirrhosis   . Diverticulosis of colon with hemorrhage 06/08/2000  . Esophageal stenosis 01/08/11  . Hiatal hernia 01/08/11  . Esophagitis 01/08/11  . Hyperplastic colon polyp 06/08/2000, 07/31/2003, 01/08/11  . Hx of adenomatous colonic polyps 01/08/11  . GERD (gastroesophageal reflux disease)   . Osteopenia   . Cholelithiasis 01/09/12  . Uterine fibroid 01/09/12  . Obesity   . PAF (paroxysmal atrial fibrillation)     occurring in the setting of bradycardia and mild first degree AV block followed by Dr. Jens Som  . Arthritis     Past Surgical History  Procedure Laterality Date  . Thyroidectomy, partial  1980  . Tubal ligation  1972  . Breast cyst excision      left  . Colonoscopy    . Polypectomy    . Ventral hernia repair  05/18/2012    Procedure: LAPAROSCOPIC VENTRAL HERNIA;  Surgeon: Adin Hector, MD;  Location: [facility];  Service: General;  Laterality: N/A;  Laparoscopic Ventral Wall Hernia with Mesh  . Insertion of mesh  05/18/2012    Procedure: INSERTION OF MESH;  Surgeon: Adin Hector, MD;  Location: [facility];  Service: General;  Laterality: N/A;  . Liver biopsy  05/18/2012   Procedure: LIVER BIOPSY;  Surgeon: Adin Hector, MD;  Location: [facility];  Service: General;;    Current Outpatient Prescriptions  Medication Sig Dispense Refill  . calcium-vitamin D (OSCAL WITH D) 250-125 MG-UNIT per tablet Take 1 tablet by mouth daily.      Marland Kitchen FeFum-FePo-FA-B Cmp-C-Zn-Mn-Cu (SE-TAN PLUS) 162-115.2-1 MG CAPS TAKE ONE CAPSULE BY MOUTH EVERY DAY  30 capsule  2  . fexofenadine (ALLEGRA) 180 MG tablet Take 180 mg by mouth daily.       . folic acid (FOLVITE) A999333 MCG tablet Take 400 mcg by mouth daily.      . furosemide (LASIX) 40 MG tablet Take 60 mg by mouth every morning. 1 and 1/2 half 40 mg tablet for a total of 60 mg.      . hydrALAZINE (APRESOLINE) 25 MG tablet Take 25 mg by mouth 2 (two) times daily.      . insulin detemir (LEVEMIR FLEXPEN) 100 UNIT/ML injection Inject 16 Units into the skin at bedtime.      . irbesartan (AVAPRO) 300 MG tablet Take 300 mg by mouth every evening.      Marland Kitchen ketoconazole (NIZORAL) 2 % cream Apply 1 application topically daily as needed. Around skin folds for irritation      . metFORMIN (GLUCOPHAGE) 1000 MG tablet Take 1,000 mg by mouth 2 (two) times daily with a meal.       . metoprolol (LOPRESSOR) 50 MG tablet Take 25-50 mg  by mouth 2 (two) times daily. Take 25 mg in the morning and 50 mg at night      . Multiple Vitamin (MULTIVITAMIN WITH MINERALS) TABS Take 1 tablet by mouth daily.      . pantoprazole (PROTONIX) 40 MG tablet Take 40 mg by mouth daily.      . propafenone (RYTHMOL SR) 325 MG 12 hr capsule Take 1 capsule (325 mg total) by mouth 2 (two) times daily.  180 capsule  3  . simvastatin (ZOCOR) 40 MG tablet Take 40 mg by mouth every evening.       . sitaGLIPtin (JANUVIA) 100 MG tablet Take 100 mg by mouth at bedtime.       . ursodiol (ACTIGALL) 300 MG capsule       . vitamin C (ASCORBIC ACID) 500 MG tablet Take 500 mg by mouth daily.      . vitamin E 400 UNIT capsule Take 400 Units by mouth daily.      Marland Kitchen warfarin (COUMADIN) 5 MG tablet  Take 2.5-5 mg by mouth daily. Take 2.5 mg on Monday, Wednesday and Friday, and take 5 mg on all other days.       No current facility-administered medications for this visit.    Allergies  Allergen Reactions  . Ace Inhibitors Anaphylaxis  . Capoten [Captopril] Anaphylaxis    Throat swells shut  . Neomycin-Bacitracin Zn-Polymyx Rash    Review of Systems negative except from HPI and PMH  Physical Exam BP 125/83  Pulse 71  Ht 5\' 4"  (1.626 m)  Wt 183 lb (83.008 kg)  BMI 31.4 kg/m2 Well developed and well nourished in no acute distress HENT normal E scleral and icterus clear Neck Supple JVP 6-7 cm +HJR  carotids brisk and full Clear to ausculation  Regular rate and rhythm, no murmurs gallops or rub Soft with active bowel sounds No clubbing cyanosis none Edema Alert and oriented, grossly normal motor and sensory function Skin Warm and Dry  ECG demonstrates sinus rhythm at 71 with intervals 20/09/40 24  Assessment and  Plan

## 2013-01-06 NOTE — Assessment & Plan Note (Signed)
As above.

## 2013-01-12 ENCOUNTER — Other Ambulatory Visit (INDEPENDENT_AMBULATORY_CARE_PROVIDER_SITE_OTHER): Payer: Medicare Other

## 2013-01-12 DIAGNOSIS — D649 Anemia, unspecified: Secondary | ICD-10-CM

## 2013-01-12 DIAGNOSIS — R6889 Other general symptoms and signs: Secondary | ICD-10-CM

## 2013-01-12 LAB — CBC WITH DIFFERENTIAL/PLATELET
Basophils Absolute: 0 10*3/uL (ref 0.0–0.1)
Basophils Relative: 0.2 % (ref 0.0–3.0)
Eosinophils Absolute: 0.1 10*3/uL (ref 0.0–0.7)
Eosinophils Relative: 1.1 % (ref 0.0–5.0)
HCT: 34.5 % — ABNORMAL LOW (ref 36.0–46.0)
Hemoglobin: 11.5 g/dL — ABNORMAL LOW (ref 12.0–15.0)
Lymphocytes Relative: 29 % (ref 12.0–46.0)
Lymphs Abs: 1.8 10*3/uL (ref 0.7–4.0)
MCHC: 33.3 g/dL (ref 30.0–36.0)
MCV: 90.1 fl (ref 78.0–100.0)
Monocytes Absolute: 0.6 10*3/uL (ref 0.1–1.0)
Monocytes Relative: 8.9 % (ref 3.0–12.0)
Neutro Abs: 3.8 10*3/uL (ref 1.4–7.7)
Neutrophils Relative %: 60.8 % (ref 43.0–77.0)
Platelets: 278 10*3/uL (ref 150.0–400.0)
RBC: 3.83 Mil/uL — ABNORMAL LOW (ref 3.87–5.11)
RDW: 14.9 % — ABNORMAL HIGH (ref 11.5–14.6)
WBC: 6.3 10*3/uL (ref 4.5–10.5)

## 2013-01-12 LAB — IBC PANEL
Iron: 51 ug/dL (ref 42–145)
Saturation Ratios: 14 % — ABNORMAL LOW (ref 20.0–50.0)
Transferrin: 260.2 mg/dL (ref 212.0–360.0)

## 2013-01-12 LAB — BASIC METABOLIC PANEL
BUN: 20 mg/dL (ref 6–23)
CO2: 31 mEq/L (ref 19–32)
Calcium: 9 mg/dL (ref 8.4–10.5)
Chloride: 98 mEq/L (ref 96–112)
Creatinine, Ser: 0.7 mg/dL (ref 0.4–1.2)
GFR: 82.35 mL/min (ref >60.00)
Glucose, Bld: 194 mg/dL — ABNORMAL HIGH (ref 70–99)
Potassium: 4.3 mEq/L (ref 3.5–5.1)
Sodium: 137 mEq/L (ref 135–145)

## 2013-01-12 LAB — VITAMIN B12: Vitamin B-12: 581 pg/mL (ref 211–911)

## 2013-01-12 LAB — HEPATIC FUNCTION PANEL
ALT: 27 U/L (ref 0–35)
AST: 28 U/L (ref 0–37)
Albumin: 3.4 g/dL — ABNORMAL LOW (ref 3.5–5.2)
Alkaline Phosphatase: 157 U/L — ABNORMAL HIGH (ref 39–117)
Bilirubin, Direct: 0.1 mg/dL (ref 0.0–0.3)
Total Bilirubin: 0.5 mg/dL (ref 0.3–1.2)
Total Protein: 7.6 g/dL (ref 6.0–8.3)

## 2013-01-12 LAB — FOLATE: Folate: >24.8 ng/mL (ref >5.9)

## 2013-01-12 LAB — TSH: TSH: 1.4 u[IU]/mL (ref 0.35–5.50)

## 2013-01-12 LAB — FERRITIN: Ferritin: 21.8 ng/mL (ref 10.0–291.0)

## 2013-01-20 ENCOUNTER — Ambulatory Visit (HOSPITAL_COMMUNITY)
Admission: RE | Admit: 2013-01-20 | Discharge: 2013-01-20 | Disposition: A | Discharge status: Home / Self Care | Payer: Medicare Other | Source: Ambulatory Visit | Attending: Family Medicine | Admitting: Family Medicine

## 2013-01-20 ENCOUNTER — Ambulatory Visit (INDEPENDENT_AMBULATORY_CARE_PROVIDER_SITE_OTHER): Payer: Medicare Other | Admitting: Gastroenterology

## 2013-01-20 ENCOUNTER — Encounter: Payer: Self-pay | Admitting: Gastroenterology

## 2013-01-20 VITALS — BP 122/72 | HR 72 | Ht 62.5 in | Wt 182.8 lb

## 2013-01-20 DIAGNOSIS — I4891 Unspecified atrial fibrillation: Secondary | ICD-10-CM

## 2013-01-20 DIAGNOSIS — K2970 Gastritis, unspecified, without bleeding: Secondary | ICD-10-CM

## 2013-01-20 DIAGNOSIS — Z0000 Encounter for general adult medical examination without abnormal findings: Secondary | ICD-10-CM

## 2013-01-20 DIAGNOSIS — Z1231 Encounter for screening mammogram for malignant neoplasm of breast: Secondary | ICD-10-CM | POA: Insufficient documentation

## 2013-01-20 DIAGNOSIS — Z5181 Encounter for therapeutic drug level monitoring: Secondary | ICD-10-CM

## 2013-01-20 DIAGNOSIS — K745 Biliary cirrhosis, unspecified: Secondary | ICD-10-CM

## 2013-01-20 DIAGNOSIS — K743 Primary biliary cirrhosis: Secondary | ICD-10-CM

## 2013-01-20 DIAGNOSIS — Z7901 Long term (current) use of anticoagulants: Secondary | ICD-10-CM

## 2013-01-20 DIAGNOSIS — D509 Iron deficiency anemia, unspecified: Secondary | ICD-10-CM

## 2013-01-20 MED ORDER — PANTOPRAZOLE SODIUM 40 MG PO TBEC: 40.0000 mg | DELAYED_RELEASE_TABLET | Freq: Every day | ORAL | Status: DC

## 2013-01-20 MED ORDER — URSODIOL 300 MG PO CAPS: 300.0000 mg | ORAL_CAPSULE | Freq: Every day | ORAL | Status: DC

## 2013-01-20 MED ORDER — SE-TAN PLUS 162-115.2-1 MG PO CAPS: ORAL_CAPSULE | ORAL | Status: DC

## 2013-01-20 NOTE — Patient Instructions (Addendum)
We have sent medications to your pharmacy for you to pick up at your convenience. CC:  [name] MD

## 2013-01-20 NOTE — Progress Notes (Signed)
This is a 78 year old Caucasian female with nonprogressive biliary cirrhosis which have followed over the last 30 years.  She has a slight increase in her serum alkaline phosphatase but her liver function tests are otherwise normal, and serum albumin is 3.4 g percent.  She has no GI or hepatobiliary symptoms, specifically no itching, mental status changes, or edema or ascites.  CT scan of the abdomen a year ago showed some lobulation of her liver and no evidence of portal hypertension.  Previous endoscopy section evidence of NSAID induced gastritis and she's had previous NSAID-induced pyloric channel ulcers with some chronic pyloric stenosis. She has chronic guaiac positive stools, and is on iron replacement therapy with a hemoglobin of 11.5.  Patient had repair of a large ventral hernia in January by Dr. Michael Boston.  Liver biopsy that time was consistent with primary biliary cirrhosis inflammation grade 2, fibrosis grade 2, with some evidence of steatohepatitis.  There was no evidence of portal hypertension or ascites at the time of surgery.   Current Medications, Allergies, Past Medical History, Past Surgical History, Family History and Social History were reviewed in [employer] record.  ROS: All systems were reviewed and are negative unless otherwise stated in the HPI.          Physical Exam: Healthy-appearing patient in no acute distress.  I cannot appreciate stigmata of chronic liver disease.  Blood pressure 122/72, pulse 72 and regular and weight 182 with a BMI of 32.88.  Chest is clear and she appears to be in a regular rhythm without murmurs gallops or rubs.  Her abdomen shows some mild distention but no organomegaly, masses or tenderness.  There is no evidence of ascites or peripheral edema.  Mental status is normal and there is no asterixis.    Assessment and Plan:  Stable primary biliary cirrhosis and a 78 year old lady was on Actigall 300 mg a day for many years.   Recent liver biopsy confirm this diagnosis, and is been no evidence of progression to severe cirrhosis, and endoscopy within the last 2 years has not shown evidence of varices, and CT scan has not shown evidence of portal hypertension.  We will continue her current medications, and I do think she needs chronic PPI therapy because of her risk of gastritis and previous peptic ulcer disease.  She also has well-controlled adult onset diabetes but is not on insulin.  Patient does take Coumadin for paroxysmal atrial fibrillation and hypertension.  She is followed by Dr. Virl Axe in cardiology.   We send a copy this note to Dr. Michael Boston , Dr. Virl Axe, and her primary care physician.

## 2013-01-21 ENCOUNTER — Other Ambulatory Visit: Payer: Self-pay | Admitting: Internal Medicine

## 2013-01-24 ENCOUNTER — Telehealth: Payer: Self-pay | Admitting: Gastroenterology

## 2013-01-24 MED ORDER — PANTOPRAZOLE SODIUM 40 MG PO TBEC: 40.0000 mg | DELAYED_RELEASE_TABLET | Freq: Every day | ORAL | Status: DC

## 2013-01-24 MED ORDER — SE-TAN PLUS 162-115.2-1 MG PO CAPS: ORAL_CAPSULE | ORAL | Status: DC

## 2013-01-24 MED ORDER — URSODIOL 300 MG PO CAPS: 300.0000 mg | ORAL_CAPSULE | Freq: Every day | ORAL | Status: DC

## 2013-01-24 NOTE — Telephone Encounter (Signed)
I called patient and verified that I am sending her Iron, Protonix and Ursodiol to [facility] in [place] on [address] for 90 days Patient verbalized understanding  RX sent

## 2013-01-27 ENCOUNTER — Telehealth: Payer: Self-pay | Admitting: *Deleted

## 2013-01-27 NOTE — Telephone Encounter (Deleted)
Ok to switch to one in the system now

## 2013-01-27 NOTE — Telephone Encounter (Signed)
Pharmacy called and patient's iron pill that we sent is not available They only have Tandem Plus  In patient's medication history she had Tandem Plus in 2012 and then refills after that has been this iron that she is on now Is it ok to switch to Tandem Plus?

## 2013-01-27 NOTE — Telephone Encounter (Signed)
Called pharmacy because per Caren Griffins, RN ok to change iron Spoke with Benjamine Mola and RX changed to Tandem Plus

## 2013-02-02 ENCOUNTER — Telehealth: Payer: Self-pay | Admitting: Internal Medicine

## 2013-02-02 NOTE — Telephone Encounter (Signed)
New problem   From last visit in Sept .   Dr. Caryl Comes was eliminate one medication from patient list. Patient is unsure of the medication name.

## 2013-02-02 NOTE — Telephone Encounter (Signed)
Advised per Dr. Caryl Comes last office visit note that she should discuss with her PCP about discontinuing hydralazine. Patient agreeable to plan,.

## 2013-02-03 ENCOUNTER — Ambulatory Visit (INDEPENDENT_AMBULATORY_CARE_PROVIDER_SITE_OTHER): Payer: Medicare Other

## 2013-02-03 DIAGNOSIS — I4891 Unspecified atrial fibrillation: Secondary | ICD-10-CM

## 2013-02-03 DIAGNOSIS — Z7901 Long term (current) use of anticoagulants: Secondary | ICD-10-CM

## 2013-02-03 LAB — POCT INR: INR: 2.7

## 2013-03-03 ENCOUNTER — Ambulatory Visit: Payer: Medicare Other | Admitting: Gastroenterology

## 2013-03-17 ENCOUNTER — Ambulatory Visit (INDEPENDENT_AMBULATORY_CARE_PROVIDER_SITE_OTHER): Payer: Medicare Other | Admitting: General Practice

## 2013-03-17 DIAGNOSIS — I4891 Unspecified atrial fibrillation: Secondary | ICD-10-CM

## 2013-03-17 DIAGNOSIS — Z7901 Long term (current) use of anticoagulants: Secondary | ICD-10-CM

## 2013-03-17 LAB — POCT INR: INR: 2.4

## 2013-04-15 ENCOUNTER — Other Ambulatory Visit: Payer: Self-pay | Admitting: Gastroenterology

## 2013-04-28 ENCOUNTER — Ambulatory Visit (INDEPENDENT_AMBULATORY_CARE_PROVIDER_SITE_OTHER): Payer: Medicare Other | Admitting: *Deleted

## 2013-04-28 DIAGNOSIS — Z7901 Long term (current) use of anticoagulants: Secondary | ICD-10-CM

## 2013-04-28 DIAGNOSIS — I4891 Unspecified atrial fibrillation: Secondary | ICD-10-CM

## 2013-04-28 LAB — POCT INR: INR: 3.2

## 2013-05-09 ENCOUNTER — Telehealth: Payer: Self-pay | Admitting: Gastroenterology

## 2013-05-09 MED ORDER — PANTOPRAZOLE SODIUM 40 MG PO TBEC: 40.0000 mg | DELAYED_RELEASE_TABLET | Freq: Every day | ORAL | Status: DC

## 2013-05-09 NOTE — Telephone Encounter (Signed)
rx sent

## 2013-05-23 ENCOUNTER — Other Ambulatory Visit: Payer: Self-pay

## 2013-05-23 ENCOUNTER — Telehealth: Payer: Self-pay | Admitting: Gastroenterology

## 2013-05-23 DIAGNOSIS — I4891 Unspecified atrial fibrillation: Secondary | ICD-10-CM

## 2013-05-23 DIAGNOSIS — I10 Essential (primary) hypertension: Secondary | ICD-10-CM

## 2013-05-23 MED ORDER — URSODIOL 300 MG PO CAPS: 300.0000 mg | ORAL_CAPSULE | Freq: Every day | ORAL | Status: DC

## 2013-05-23 MED ORDER — SE-TAN PLUS 162-115.2-1 MG PO CAPS: ORAL_CAPSULE | ORAL | Status: DC

## 2013-05-23 MED ORDER — PANTOPRAZOLE SODIUM 40 MG PO TBEC: 40.0000 mg | DELAYED_RELEASE_TABLET | Freq: Every day | ORAL | Status: DC

## 2013-05-23 NOTE — Telephone Encounter (Signed)
Called patient, medications sent

## 2013-06-09 ENCOUNTER — Ambulatory Visit: Payer: Medicare Other | Admitting: Gastroenterology

## 2013-06-09 ENCOUNTER — Ambulatory Visit (INDEPENDENT_AMBULATORY_CARE_PROVIDER_SITE_OTHER): Payer: Medicare Other | Admitting: Pharmacist

## 2013-06-09 DIAGNOSIS — I4891 Unspecified atrial fibrillation: Secondary | ICD-10-CM

## 2013-06-09 DIAGNOSIS — Z5181 Encounter for therapeutic drug level monitoring: Secondary | ICD-10-CM | POA: Insufficient documentation

## 2013-06-09 DIAGNOSIS — Z7901 Long term (current) use of anticoagulants: Secondary | ICD-10-CM

## 2013-06-09 HISTORY — DX: Encounter for therapeutic drug level monitoring: Z51.81

## 2013-06-09 LAB — POCT INR: INR: 3.2

## 2013-07-13 ENCOUNTER — Other Ambulatory Visit: Payer: Self-pay | Admitting: Internal Medicine

## 2013-07-21 ENCOUNTER — Ambulatory Visit (INDEPENDENT_AMBULATORY_CARE_PROVIDER_SITE_OTHER): Payer: Medicare Other

## 2013-07-21 DIAGNOSIS — Z5181 Encounter for therapeutic drug level monitoring: Secondary | ICD-10-CM

## 2013-07-21 DIAGNOSIS — I4891 Unspecified atrial fibrillation: Secondary | ICD-10-CM

## 2013-07-21 DIAGNOSIS — Z7901 Long term (current) use of anticoagulants: Secondary | ICD-10-CM

## 2013-07-21 LAB — POCT INR: INR: 2.6

## 2013-09-01 ENCOUNTER — Ambulatory Visit (INDEPENDENT_AMBULATORY_CARE_PROVIDER_SITE_OTHER): Payer: Medicare Other

## 2013-09-01 DIAGNOSIS — Z7901 Long term (current) use of anticoagulants: Secondary | ICD-10-CM

## 2013-09-01 DIAGNOSIS — I4891 Unspecified atrial fibrillation: Secondary | ICD-10-CM

## 2013-09-01 DIAGNOSIS — Z5181 Encounter for therapeutic drug level monitoring: Secondary | ICD-10-CM

## 2013-09-01 LAB — POCT INR: INR: 3.1

## 2013-10-06 ENCOUNTER — Ambulatory Visit (INDEPENDENT_AMBULATORY_CARE_PROVIDER_SITE_OTHER): Payer: Medicare Other | Admitting: Pharmacist

## 2013-10-06 DIAGNOSIS — I4891 Unspecified atrial fibrillation: Secondary | ICD-10-CM

## 2013-10-06 DIAGNOSIS — Z5181 Encounter for therapeutic drug level monitoring: Secondary | ICD-10-CM

## 2013-10-06 DIAGNOSIS — Z7901 Long term (current) use of anticoagulants: Secondary | ICD-10-CM

## 2013-10-06 LAB — POCT INR: INR: 2.6

## 2013-10-24 ENCOUNTER — Other Ambulatory Visit (HOSPITAL_COMMUNITY): Payer: Self-pay | Admitting: Family Medicine

## 2013-10-24 DIAGNOSIS — Z1231 Encounter for screening mammogram for malignant neoplasm of breast: Secondary | ICD-10-CM

## 2013-10-27 DIAGNOSIS — E119 Type 2 diabetes mellitus without complications: Secondary | ICD-10-CM | POA: Insufficient documentation

## 2013-10-27 HISTORY — DX: Type 2 diabetes mellitus without complications: E11.9

## 2013-10-31 ENCOUNTER — Encounter: Payer: Self-pay | Admitting: Gastroenterology

## 2013-11-17 ENCOUNTER — Ambulatory Visit (INDEPENDENT_AMBULATORY_CARE_PROVIDER_SITE_OTHER): Payer: Medicare Other | Admitting: Pharmacist

## 2013-11-17 DIAGNOSIS — I4891 Unspecified atrial fibrillation: Secondary | ICD-10-CM

## 2013-11-17 DIAGNOSIS — Z7901 Long term (current) use of anticoagulants: Secondary | ICD-10-CM

## 2013-11-17 DIAGNOSIS — Z5181 Encounter for therapeutic drug level monitoring: Secondary | ICD-10-CM

## 2013-11-17 LAB — POCT INR: INR: 2.4

## 2013-12-29 ENCOUNTER — Ambulatory Visit (INDEPENDENT_AMBULATORY_CARE_PROVIDER_SITE_OTHER): Payer: Medicare Other | Admitting: Pharmacist

## 2013-12-29 ENCOUNTER — Other Ambulatory Visit: Payer: Self-pay | Admitting: Internal Medicine

## 2013-12-29 DIAGNOSIS — I4891 Unspecified atrial fibrillation: Secondary | ICD-10-CM

## 2013-12-29 DIAGNOSIS — Z5181 Encounter for therapeutic drug level monitoring: Secondary | ICD-10-CM

## 2013-12-29 DIAGNOSIS — Z7901 Long term (current) use of anticoagulants: Secondary | ICD-10-CM

## 2013-12-29 LAB — POCT INR: INR: 2.8

## 2013-12-29 MED ORDER — WARFARIN SODIUM 5 MG PO TABS: ORAL_TABLET | ORAL | Status: DC

## 2014-01-26 ENCOUNTER — Encounter: Payer: Self-pay | Admitting: Gastroenterology

## 2014-01-26 ENCOUNTER — Ambulatory Visit (HOSPITAL_COMMUNITY)
Admission: RE | Admit: 2014-01-26 | Discharge: 2014-01-26 | Disposition: A | Discharge status: Home / Self Care | Payer: Medicare Other | Source: Ambulatory Visit | Attending: Family Medicine | Admitting: Family Medicine

## 2014-01-26 ENCOUNTER — Other Ambulatory Visit (INDEPENDENT_AMBULATORY_CARE_PROVIDER_SITE_OTHER): Payer: Medicare Other

## 2014-01-26 ENCOUNTER — Ambulatory Visit (INDEPENDENT_AMBULATORY_CARE_PROVIDER_SITE_OTHER): Payer: Medicare Other | Admitting: Gastroenterology

## 2014-01-26 VITALS — BP 126/82 | HR 80 | Ht 62.0 in | Wt 180.0 lb

## 2014-01-26 DIAGNOSIS — Z1231 Encounter for screening mammogram for malignant neoplasm of breast: Secondary | ICD-10-CM | POA: Diagnosis present

## 2014-01-26 DIAGNOSIS — K743 Primary biliary cirrhosis: Secondary | ICD-10-CM

## 2014-01-26 DIAGNOSIS — K745 Biliary cirrhosis, unspecified: Secondary | ICD-10-CM

## 2014-01-26 LAB — HEPATIC FUNCTION PANEL
ALT: 26 U/L (ref 0–35)
AST: 30 U/L (ref 0–37)
Albumin: 3.5 g/dL (ref 3.5–5.2)
Alkaline Phosphatase: 127 U/L — ABNORMAL HIGH (ref 39–117)
Bilirubin, Direct: 0.1 mg/dL (ref 0.0–0.3)
Total Bilirubin: 0.6 mg/dL (ref 0.2–1.2)
Total Protein: 7.8 g/dL (ref 6.0–8.3)

## 2014-01-26 MED ORDER — URSODIOL 300 MG PO CAPS: 300.0000 mg | ORAL_CAPSULE | Freq: Two times a day (BID) | ORAL | Status: DC

## 2014-01-26 NOTE — Patient Instructions (Addendum)
Your physician has requested that you go to the basement for the following lab work before leaving today:Hepatic function panel.  Increase your Actigall 300 mg one tablet by mouth twice daily. A new prescription has been sent to your pharmacy.   Thank you for choosing me and [facility].  Pricilla Riffle. Dagoberto Ligas., MD., Marval Regal

## 2014-01-26 NOTE — Progress Notes (Signed)
    History of Present Illness: This is a 79 year old female previously followed by Dr. Verl Blalock. She has a long history of primary biliary cirrhosis. She underwent liver biopsy at the time of a ventral hernia repair in January 2014 in her liver biopsy as below. She has no gastrointestinal complaints. Her liver function tests remained relatively stable over the years only minor elevations of alkaline phosphatase her last liver function tests were performed in January 2015 with a mildly elevated alkaline phosphatase at 168 and the rest of her liver function tests were normal.  Liver, needle/core biopsy Jan 2014 - CHRONIC ACTIVE HEPATITIS, CONSISTENT WITH PRIMARY BILIARY CIRRHOSIS. PLEASE SEE COMMENT. Microscopic Comment The overall findings are diagnostic for primary biliary cirrhosis, inflammation grade 2, and fibrosis stage 2-3. The presence of patchy hepatocyte ballooning and prominent perisinusoidal fibrosis is suggestive of superimposed steatohepatitis.   Current Medications, Allergies, Past Medical History, Past Surgical History, Family History and Social History were reviewed in [employer] record.  Physical Exam: General: Well developed , well nourished, no acute distress Head: Normocephalic and atraumatic Eyes:  sclerae anicteric, EOMI Ears: Normal auditory acuity Mouth: No deformity or lesions Lungs: Clear throughout to auscultation Heart: Regular rate and rhythm; no murmurs, rubs or bruits Abdomen: Soft, non tender and non distended. No masses, hepatosplenomegaly or hernias noted. Normal Bowel sounds Musculoskeletal: Symmetrical with no gross deformities  Pulses:  Normal pulses noted Extremities: No clubbing, cyanosis, or deformities noted. 1 + pedal/pretibial edema Neurological: Alert oriented x 4, grossly nonfocal Psychological:  Alert and cooperative. Normal mood and affect  Assessment and Recommendations:  1. PBC and probable steatohepatitis.  Liver disease has been stable for years. Liver biopsy results reviewed. Long term low fat, weight loss diet. Actigall 300 mg po bid long term. LFTs today. REV in 1 year.  2. History of GU and GERD. Continue pantoprazole 40 mg daily long term.  3. Chronic coumadin anticoagulation for PAF.

## 2014-02-01 ENCOUNTER — Ambulatory Visit (INDEPENDENT_AMBULATORY_CARE_PROVIDER_SITE_OTHER): Payer: Medicare Other | Admitting: Internal Medicine

## 2014-02-01 ENCOUNTER — Ambulatory Visit (INDEPENDENT_AMBULATORY_CARE_PROVIDER_SITE_OTHER): Payer: Medicare Other | Admitting: *Deleted

## 2014-02-01 ENCOUNTER — Encounter: Payer: Self-pay | Admitting: Internal Medicine

## 2014-02-01 VITALS — BP 134/78 | HR 77 | Ht 62.0 in | Wt 179.0 lb

## 2014-02-01 DIAGNOSIS — I480 Paroxysmal atrial fibrillation: Secondary | ICD-10-CM

## 2014-02-01 DIAGNOSIS — I4891 Unspecified atrial fibrillation: Secondary | ICD-10-CM

## 2014-02-01 DIAGNOSIS — Z5181 Encounter for therapeutic drug level monitoring: Secondary | ICD-10-CM

## 2014-02-01 DIAGNOSIS — Z7901 Long term (current) use of anticoagulants: Secondary | ICD-10-CM

## 2014-02-01 LAB — POCT INR: INR: 2.1

## 2014-02-01 NOTE — Patient Instructions (Signed)
Your physician recommends that you continue on your current medications as directed. Please refer to the Current Medication list given to you today.  Your physician wants you to follow-up in: 1 year with Dr. Klein.  You will receive a reminder letter in the mail two months in advance. If you don't receive a letter, please call our office to schedule the follow-up appointment.  

## 2014-02-01 NOTE — Progress Notes (Signed)
Patient Care Team: [name], DO as PCP - General (Family Medicine) [name], MD as Consulting Physician (Gastroenterology) [name], MD as Consulting Physician (Cardiology)   HPI  Sara Hancock is a 79 y.o. female seen in followup for atrial fibrillation that is paroxysmal. It occurs in the setting of background bradycardia and mild first-degree AV block. She is doing quite well without recurrent tachypalpitations.  She is tolerating [name] without complaint  She denies exercise intolerance chest pain or shortness of breath.,     Past Medical History  Diagnosis Date  . Type II or unspecified type diabetes mellitus without mention of complication, not stated as uncontrolled   . Pure hypercholesterolemia   . HTN (hypertension)   . Anemia   . Biliary cirrhosis   . Diverticulosis of colon with hemorrhage 06/08/2000  . Esophageal stenosis 01/08/11  . Hiatal hernia 01/08/11  . Esophagitis 01/08/11  . Hyperplastic colon polyp 06/08/2000, 07/31/2003, 01/08/11  . Hx of adenomatous colonic polyps 01/08/11  . GERD (gastroesophageal reflux disease)   . Osteopenia   . Cholelithiasis 01/09/12  . Uterine fibroid 01/09/12  . Obesity   . PAF (paroxysmal atrial fibrillation)     occurring in the setting of bradycardia and mild first degree AV block followed by Dr. Jens Som  . Arthritis     Past Surgical History  Procedure Laterality Date  . Thyroidectomy, partial  1980  . Tubal ligation  1972  . Breast cyst excision      left  . Colonoscopy    . Polypectomy    . Ventral hernia repair  05/18/2012    Procedure: LAPAROSCOPIC VENTRAL HERNIA;  Surgeon: Adin Hector, MD;  Location: [facility];  Service: General;  Laterality: N/A;  Laparoscopic Ventral Wall Hernia with Mesh  . Insertion of mesh  05/18/2012    Procedure: INSERTION OF MESH;  Surgeon: Adin Hector, MD;  Location: [facility];  Service: General;  Laterality: N/A;  . Liver biopsy  05/18/2012    Procedure: LIVER BIOPSY;   Surgeon: Adin Hector, MD;  Location: [facility];  Service: General;;    Current Outpatient Prescriptions  Medication Sig Dispense Refill  . calcium-vitamin D (OSCAL WITH D) 250-125 MG-UNIT per tablet Take 1 tablet by mouth daily.      Marland Kitchen FeFum-FePo-FA-B Cmp-C-Zn-Mn-Cu (SE-TAN PLUS) 162-115.2-1 MG CAPS TAKE ONE CAPSULE BY MOUTH EVERY DAY  90 capsule  11  . fexofenadine (ALLEGRA) 180 MG tablet Take 180 mg by mouth daily.       . folic acid (FOLVITE) A999333 MCG tablet Take 400 mcg by mouth daily.      . furosemide (LASIX) 40 MG tablet Take 60 mg by mouth every morning. 1 and 1/2 half 40 mg tablet for a total of 60 mg.      . hydrALAZINE (APRESOLINE) 25 MG tablet Take 25 mg by mouth 2 (two) times daily.      . insulin detemir (LEVEMIR FLEXPEN) 100 UNIT/ML injection Inject 25 Units into the skin at bedtime.       . irbesartan (AVAPRO) 300 MG tablet Take 300 mg by mouth every evening.      Marland Kitchen ketoconazole (NIZORAL) 2 % cream Apply 1 application topically daily as needed. Around skin folds for irritation      . metFORMIN (GLUCOPHAGE) 1000 MG tablet Take 1,000 mg by mouth 2 (two) times daily with a meal.       . metoprolol (LOPRESSOR) 50 MG tablet Take 25-50  mg by mouth 2 (two) times daily. Take 25 mg in the morning and 50 mg at night      . Multiple Vitamin (MULTIVITAMIN WITH MINERALS) TABS Take 1 tablet by mouth daily.      . pantoprazole (PROTONIX) 40 MG tablet Take 1 tablet (40 mg total) by mouth daily.  90 tablet  1  . propafenone (RYTHMOL SR) 325 MG 12 hr capsule TAKE 1 CAPSULE BY MOUTH TWICE DAILY  180 capsule  0  . simvastatin (ZOCOR) 40 MG tablet Take 40 mg by mouth every evening.       . sitaGLIPtin (JANUVIA) 100 MG tablet Take 100 mg by mouth at bedtime.       . ursodiol (ACTIGALL) 300 MG capsule Take 1 capsule (300 mg total) by mouth 2 (two) times daily.  180 capsule  3  . vitamin C (ASCORBIC ACID) 500 MG tablet Take 500 mg by mouth daily.      . vitamin E 400 UNIT capsule Take 400 Units by  mouth daily.      Marland Kitchen warfarin (COUMADIN) 5 MG tablet Take as directed by Coumadin clinic  90 tablet  1   No current facility-administered medications for this visit.    Allergies  Allergen Reactions  . Ace Inhibitors Anaphylaxis  . Capoten [Captopril] Anaphylaxis    Throat swells shut  . Neomycin-Bacitracin Zn-Polymyx Rash    Review of Systems negative except from HPI and PMH  Physical Exam BP 134/78  Pulse 77  Ht 5\' 2"  (1.575 m)  Wt 179 lb (81.194 kg)  BMI 32.73 kg/m2 Well developed and well nourished in no acute distress HENT normal E scleral and icterus clear Neck Supple Of a Clear to ausculation  Regular rate and rhythm, no murmurs gallops or rub Soft with active bowel sounds No clubbing cyanosis none Edema Alert and oriented, grossly normal motor and sensory function Skin Warm and Dry  ECG demonstrates sinus rhythm at 71 with intervals 20/09/40 24  Assessment and  Plan  Atrial fibrillation  Hypertension  She would like to stay on her very complicated regime and is disinclined to pursue consolidation of her various low dose meds  She is also not inclined to try a noac

## 2014-03-16 ENCOUNTER — Ambulatory Visit (INDEPENDENT_AMBULATORY_CARE_PROVIDER_SITE_OTHER): Payer: Medicare Other

## 2014-03-16 DIAGNOSIS — Z7901 Long term (current) use of anticoagulants: Secondary | ICD-10-CM

## 2014-03-16 DIAGNOSIS — Z5181 Encounter for therapeutic drug level monitoring: Secondary | ICD-10-CM

## 2014-03-16 DIAGNOSIS — I4891 Unspecified atrial fibrillation: Secondary | ICD-10-CM

## 2014-03-16 LAB — POCT INR: INR: 2.5

## 2014-03-25 ENCOUNTER — Other Ambulatory Visit: Payer: Self-pay | Admitting: Gastroenterology

## 2014-03-25 ENCOUNTER — Other Ambulatory Visit: Payer: Self-pay | Admitting: Internal Medicine

## 2014-04-27 ENCOUNTER — Ambulatory Visit (INDEPENDENT_AMBULATORY_CARE_PROVIDER_SITE_OTHER): Payer: Medicare Other | Admitting: *Deleted

## 2014-04-27 DIAGNOSIS — I4891 Unspecified atrial fibrillation: Secondary | ICD-10-CM

## 2014-04-27 DIAGNOSIS — Z5181 Encounter for therapeutic drug level monitoring: Secondary | ICD-10-CM

## 2014-04-27 DIAGNOSIS — Z7901 Long term (current) use of anticoagulants: Secondary | ICD-10-CM

## 2014-04-27 LAB — POCT INR: INR: 3

## 2014-06-08 ENCOUNTER — Ambulatory Visit (INDEPENDENT_AMBULATORY_CARE_PROVIDER_SITE_OTHER): Payer: Medicare Other | Admitting: Pharmacist Clinician (PhC)/ Clinical Pharmacy Specialist

## 2014-06-08 DIAGNOSIS — Z7901 Long term (current) use of anticoagulants: Secondary | ICD-10-CM

## 2014-06-08 DIAGNOSIS — I4891 Unspecified atrial fibrillation: Secondary | ICD-10-CM

## 2014-06-08 DIAGNOSIS — Z5181 Encounter for therapeutic drug level monitoring: Secondary | ICD-10-CM

## 2014-06-08 LAB — POCT INR: INR: 3.8

## 2014-06-14 ENCOUNTER — Other Ambulatory Visit: Payer: Self-pay | Admitting: Gastroenterology

## 2014-06-16 ENCOUNTER — Other Ambulatory Visit: Payer: Self-pay | Admitting: Internal Medicine

## 2014-06-16 ENCOUNTER — Other Ambulatory Visit: Payer: Self-pay

## 2014-06-16 MED ORDER — WARFARIN SODIUM 5 MG PO TABS: ORAL_TABLET | ORAL | Status: DC

## 2014-06-16 NOTE — Telephone Encounter (Signed)
Warfarin refilled to pharmacy

## 2014-06-29 ENCOUNTER — Ambulatory Visit (INDEPENDENT_AMBULATORY_CARE_PROVIDER_SITE_OTHER): Payer: Medicare Other | Admitting: *Deleted

## 2014-06-29 DIAGNOSIS — I4891 Unspecified atrial fibrillation: Secondary | ICD-10-CM

## 2014-06-29 DIAGNOSIS — Z7901 Long term (current) use of anticoagulants: Secondary | ICD-10-CM

## 2014-06-29 DIAGNOSIS — Z5181 Encounter for therapeutic drug level monitoring: Secondary | ICD-10-CM

## 2014-06-29 LAB — POCT INR: INR: 3.2

## 2014-07-20 ENCOUNTER — Ambulatory Visit (INDEPENDENT_AMBULATORY_CARE_PROVIDER_SITE_OTHER): Payer: Medicare Other | Admitting: *Deleted

## 2014-07-20 DIAGNOSIS — Z7901 Long term (current) use of anticoagulants: Secondary | ICD-10-CM | POA: Diagnosis not present

## 2014-07-20 DIAGNOSIS — Z5181 Encounter for therapeutic drug level monitoring: Secondary | ICD-10-CM | POA: Diagnosis not present

## 2014-07-20 DIAGNOSIS — I4891 Unspecified atrial fibrillation: Secondary | ICD-10-CM

## 2014-07-20 LAB — POCT INR: INR: 2.6

## 2014-07-31 ENCOUNTER — Other Ambulatory Visit: Payer: Self-pay | Admitting: Dermatology

## 2014-08-10 ENCOUNTER — Ambulatory Visit (INDEPENDENT_AMBULATORY_CARE_PROVIDER_SITE_OTHER): Payer: Medicare Other | Admitting: *Deleted

## 2014-08-10 DIAGNOSIS — Z5181 Encounter for therapeutic drug level monitoring: Secondary | ICD-10-CM

## 2014-08-10 DIAGNOSIS — Z7901 Long term (current) use of anticoagulants: Secondary | ICD-10-CM

## 2014-08-10 DIAGNOSIS — I4891 Unspecified atrial fibrillation: Secondary | ICD-10-CM

## 2014-08-10 LAB — POCT INR: INR: 2.3

## 2014-09-14 ENCOUNTER — Ambulatory Visit (INDEPENDENT_AMBULATORY_CARE_PROVIDER_SITE_OTHER): Payer: Medicare Other | Admitting: *Deleted

## 2014-09-14 DIAGNOSIS — Z7901 Long term (current) use of anticoagulants: Secondary | ICD-10-CM

## 2014-09-14 DIAGNOSIS — Z5181 Encounter for therapeutic drug level monitoring: Secondary | ICD-10-CM

## 2014-09-14 DIAGNOSIS — I4891 Unspecified atrial fibrillation: Secondary | ICD-10-CM

## 2014-09-14 LAB — POCT INR: INR: 2.5

## 2014-10-26 ENCOUNTER — Ambulatory Visit (INDEPENDENT_AMBULATORY_CARE_PROVIDER_SITE_OTHER): Payer: Medicare Other | Admitting: *Deleted

## 2014-10-26 DIAGNOSIS — I4891 Unspecified atrial fibrillation: Secondary | ICD-10-CM | POA: Diagnosis not present

## 2014-10-26 DIAGNOSIS — Z5181 Encounter for therapeutic drug level monitoring: Secondary | ICD-10-CM | POA: Diagnosis not present

## 2014-10-26 DIAGNOSIS — Z7901 Long term (current) use of anticoagulants: Secondary | ICD-10-CM | POA: Diagnosis not present

## 2014-10-26 LAB — POCT INR: INR: 2.8

## 2014-11-30 ENCOUNTER — Other Ambulatory Visit: Payer: Self-pay | Admitting: Internal Medicine

## 2014-11-30 NOTE — Telephone Encounter (Signed)
Coumadin refill

## 2014-12-07 ENCOUNTER — Ambulatory Visit (INDEPENDENT_AMBULATORY_CARE_PROVIDER_SITE_OTHER): Payer: Medicare Other

## 2014-12-07 DIAGNOSIS — I4891 Unspecified atrial fibrillation: Secondary | ICD-10-CM | POA: Diagnosis not present

## 2014-12-07 DIAGNOSIS — Z5181 Encounter for therapeutic drug level monitoring: Secondary | ICD-10-CM | POA: Diagnosis not present

## 2014-12-07 DIAGNOSIS — Z7901 Long term (current) use of anticoagulants: Secondary | ICD-10-CM

## 2014-12-07 LAB — POCT INR: INR: 2.5

## 2014-12-14 ENCOUNTER — Other Ambulatory Visit (HOSPITAL_COMMUNITY): Payer: Self-pay | Admitting: Family Medicine

## 2014-12-14 DIAGNOSIS — Z1231 Encounter for screening mammogram for malignant neoplasm of breast: Secondary | ICD-10-CM

## 2015-01-17 ENCOUNTER — Ambulatory Visit (INDEPENDENT_AMBULATORY_CARE_PROVIDER_SITE_OTHER): Payer: Medicare Other | Admitting: *Deleted

## 2015-01-17 DIAGNOSIS — Z7901 Long term (current) use of anticoagulants: Secondary | ICD-10-CM | POA: Diagnosis not present

## 2015-01-17 DIAGNOSIS — I4891 Unspecified atrial fibrillation: Secondary | ICD-10-CM

## 2015-01-17 DIAGNOSIS — Z5181 Encounter for therapeutic drug level monitoring: Secondary | ICD-10-CM

## 2015-01-17 LAB — POCT INR: INR: 2.9

## 2015-01-22 ENCOUNTER — Telehealth: Payer: Self-pay | Admitting: Gastroenterology

## 2015-01-22 DIAGNOSIS — K745 Biliary cirrhosis, unspecified: Secondary | ICD-10-CM

## 2015-01-22 NOTE — Telephone Encounter (Signed)
CBC, CMP 

## 2015-01-22 NOTE — Telephone Encounter (Signed)
Patient has a follow up with you on 02/01/15.  Do you want her to have labs prior to her appt?

## 2015-01-22 NOTE — Telephone Encounter (Signed)
Patient notified to come at her convenience

## 2015-01-23 ENCOUNTER — Other Ambulatory Visit (INDEPENDENT_AMBULATORY_CARE_PROVIDER_SITE_OTHER): Payer: Medicare Other

## 2015-01-23 DIAGNOSIS — K745 Biliary cirrhosis, unspecified: Secondary | ICD-10-CM | POA: Diagnosis not present

## 2015-01-23 LAB — CBC WITH DIFFERENTIAL/PLATELET
Basophils Absolute: 0 10*3/uL (ref 0.0–0.1)
Basophils Relative: 0.3 % (ref 0.0–3.0)
Eosinophils Absolute: 0 10*3/uL (ref 0.0–0.7)
Eosinophils Relative: 0.6 % (ref 0.0–5.0)
HCT: 31.8 % — ABNORMAL LOW (ref 36.0–46.0)
Hemoglobin: 10.4 g/dL — ABNORMAL LOW (ref 12.0–15.0)
Lymphocytes Relative: 31.9 % (ref 12.0–46.0)
Lymphs Abs: 2.2 10*3/uL (ref 0.7–4.0)
MCHC: 32.8 g/dL (ref 30.0–36.0)
MCV: 85.5 fl (ref 78.0–100.0)
Monocytes Absolute: 0.7 10*3/uL (ref 0.1–1.0)
Monocytes Relative: 10.7 % (ref 3.0–12.0)
Neutro Abs: 3.9 10*3/uL (ref 1.4–7.7)
Neutrophils Relative %: 56.5 % (ref 43.0–77.0)
Platelets: 258 10*3/uL (ref 150.0–400.0)
RBC: 3.72 Mil/uL — ABNORMAL LOW (ref 3.87–5.11)
RDW: 16.8 % — ABNORMAL HIGH (ref 11.5–15.5)
WBC: 6.9 10*3/uL (ref 4.0–10.5)

## 2015-01-23 LAB — COMPREHENSIVE METABOLIC PANEL
ALT: 21 U/L (ref 0–35)
AST: 22 U/L (ref 0–37)
Albumin: 3.9 g/dL (ref 3.5–5.2)
Alkaline Phosphatase: 105 U/L (ref 39–117)
BUN: 25 mg/dL — ABNORMAL HIGH (ref 6–23)
CO2: 31 mEq/L (ref 19–32)
Calcium: 9.3 mg/dL (ref 8.4–10.5)
Chloride: 101 mEq/L (ref 96–112)
Creatinine, Ser: 0.88 mg/dL (ref 0.40–1.20)
GFR: 66.03 mL/min (ref >60.00)
Glucose, Bld: 165 mg/dL — ABNORMAL HIGH (ref 70–99)
Potassium: 4.1 mEq/L (ref 3.5–5.1)
Sodium: 140 mEq/L (ref 135–145)
Total Bilirubin: 0.4 mg/dL (ref 0.2–1.2)
Total Protein: 7.5 g/dL (ref 6.0–8.3)

## 2015-01-24 ENCOUNTER — Other Ambulatory Visit (INDEPENDENT_AMBULATORY_CARE_PROVIDER_SITE_OTHER): Payer: Medicare Other

## 2015-01-24 ENCOUNTER — Other Ambulatory Visit: Payer: Self-pay

## 2015-01-24 DIAGNOSIS — D649 Anemia, unspecified: Secondary | ICD-10-CM | POA: Diagnosis not present

## 2015-01-24 DIAGNOSIS — D509 Iron deficiency anemia, unspecified: Secondary | ICD-10-CM

## 2015-01-24 DIAGNOSIS — Z79899 Other long term (current) drug therapy: Secondary | ICD-10-CM | POA: Diagnosis not present

## 2015-01-24 DIAGNOSIS — K745 Biliary cirrhosis, unspecified: Secondary | ICD-10-CM | POA: Diagnosis not present

## 2015-01-24 LAB — FOLATE: Folate: >24.8 ng/mL (ref >5.9)

## 2015-01-24 LAB — IBC PANEL
Iron: 56 ug/dL (ref 42–145)
Saturation Ratios: 12.5 % — ABNORMAL LOW (ref 20.0–50.0)
Transferrin: 319 mg/dL (ref 212.0–360.0)

## 2015-01-24 LAB — VITAMIN B12: Vitamin B-12: 247 pg/mL (ref 211–911)

## 2015-01-24 LAB — FERRITIN: Ferritin: 11 ng/mL (ref 10.0–291.0)

## 2015-01-25 ENCOUNTER — Other Ambulatory Visit: Payer: Self-pay

## 2015-01-25 DIAGNOSIS — D509 Iron deficiency anemia, unspecified: Secondary | ICD-10-CM

## 2015-01-25 MED ORDER — IRON 325 (65 FE) MG PO TABS: 325.0000 mg | ORAL_TABLET | Freq: Every day | ORAL | Status: DC

## 2015-01-29 ENCOUNTER — Encounter: Payer: Self-pay | Admitting: Gastroenterology

## 2015-02-01 ENCOUNTER — Other Ambulatory Visit (HOSPITAL_COMMUNITY): Payer: Self-pay | Admitting: Family Medicine

## 2015-02-01 ENCOUNTER — Encounter: Payer: Self-pay | Admitting: Gastroenterology

## 2015-02-01 ENCOUNTER — Ambulatory Visit (INDEPENDENT_AMBULATORY_CARE_PROVIDER_SITE_OTHER): Payer: Medicare Other | Admitting: Gastroenterology

## 2015-02-01 ENCOUNTER — Ambulatory Visit (HOSPITAL_COMMUNITY)
Admission: RE | Admit: 2015-02-01 | Discharge: 2015-02-01 | Disposition: A | Discharge status: Home / Self Care | Payer: Medicare Other | Source: Ambulatory Visit | Attending: Family Medicine | Admitting: Family Medicine

## 2015-02-01 VITALS — BP 112/80 | HR 68 | Ht 62.0 in | Wt 176.0 lb

## 2015-02-01 DIAGNOSIS — D508 Other iron deficiency anemias: Secondary | ICD-10-CM | POA: Diagnosis not present

## 2015-02-01 DIAGNOSIS — Z1231 Encounter for screening mammogram for malignant neoplasm of breast: Secondary | ICD-10-CM | POA: Insufficient documentation

## 2015-02-01 DIAGNOSIS — K743 Primary biliary cirrhosis: Secondary | ICD-10-CM

## 2015-02-01 DIAGNOSIS — Z86010 Personal history of colonic polyps: Secondary | ICD-10-CM | POA: Diagnosis not present

## 2015-02-01 MED ORDER — PANTOPRAZOLE SODIUM 40 MG PO TBEC: 40.0000 mg | DELAYED_RELEASE_TABLET | Freq: Every day | ORAL | Status: DC

## 2015-02-01 MED ORDER — URSODIOL 300 MG PO CAPS: 300.0000 mg | ORAL_CAPSULE | Freq: Two times a day (BID) | ORAL | Status: DC

## 2015-02-01 NOTE — Patient Instructions (Signed)
We have sent the following medications to your pharmacy for you to pick up at your convenience: Protonix and Ursodiol  Follow up in one year.  Colonoscopy recall due 12-2015  Thank you for choosing me and [facility].  Pricilla Riffle. Dagoberto Ligas., MD., Marval Regal

## 2015-02-01 NOTE — Progress Notes (Signed)
    History of Present Illness: This is a 80 year old female with PBC and steatohepatitis returning for follow-up. Recurrent iron deficiency anemia with Hb=10.4 was noted on recent blood work and she has resumed iron replacement. Liver function tests were unremarkable. She has no gastrointestinal complaints. He underwent EGD and colonoscopy in August 2012 showing a small adenomatous colon polyp, erosive gastritis, pyloric stenosis, hiatal hernia and esophagitis.  Current Medications, Allergies, Past Medical History, Past Surgical History, Family History and Social History were reviewed in [employer] record.  Physical Exam: General: Well developed , well nourished, no acute distress Head: Normocephalic and atraumatic Eyes:  sclerae anicteric, EOMI Ears: Normal auditory acuity Mouth: No deformity or lesions Lungs: Clear throughout to auscultation Heart: Regular rate and rhythm; no murmurs, rubs or bruits Abdomen: Soft, non tender and non distended. No masses, hepatosplenomegaly or hernias noted. Normal Bowel sounds Musculoskeletal: Symmetrical with no gross deformities  Pulses:  Normal pulses noted Extremities: No clubbing, cyanosis, edema or deformities noted Neurological: Alert oriented x 4, grossly nonfocal Psychological:  Alert and cooperative. Normal mood and affect  Assessment and Recommendations:  1. PBC and probable steatohepatitis. Liver disease has been stable for years. Liver biopsy results reviewed. Long term low fat, weight loss diet supervised by her PCP. Actigall 300 mg po bid long term. REV in 1 year.  2. History of erosive gastritis, GU, esophagitis and GERD. Continue pantoprazole 40 mg daily long term.  3. Personal history of adenomatous colon polyps. 5 year interval surveillance colonoscopy is due in 12/2015.   4. Recurrent Fe def anemia. Likely due to erosive gastritis and anticoagulation. Fe replacement for 3 months and follow up CBC and Fe  studies.   5. Chronic coumadin anticoagulation for PAF.

## 2015-02-12 ENCOUNTER — Other Ambulatory Visit: Payer: Self-pay

## 2015-02-12 ENCOUNTER — Ambulatory Visit (INDEPENDENT_AMBULATORY_CARE_PROVIDER_SITE_OTHER): Payer: Medicare Other | Admitting: Internal Medicine

## 2015-02-12 ENCOUNTER — Encounter: Payer: Self-pay | Admitting: Internal Medicine

## 2015-02-12 VITALS — BP 136/78 | HR 58 | Ht 62.5 in | Wt 176.6 lb

## 2015-02-12 DIAGNOSIS — I480 Paroxysmal atrial fibrillation: Secondary | ICD-10-CM

## 2015-02-12 MED ORDER — PROPAFENONE HCL ER 325 MG PO CP12: 325.0000 mg | ORAL_CAPSULE | Freq: Two times a day (BID) | ORAL | Status: DC

## 2015-02-12 MED ORDER — WARFARIN SODIUM 5 MG PO TABS: ORAL_TABLET | ORAL | Status: DC

## 2015-02-12 NOTE — Progress Notes (Signed)
Patient Care Team: [name], DO as PCP - General (Family Medicine) [name], MD as Consulting Physician (Gastroenterology) [name], MD as Consulting Physician (Cardiology)   HPI  Sara Hancock is a 80 y.o. female seen in followup for atrial fibrillation that is paroxysmal. It occurs in the setting of background bradycardia and mild first-degree AV block. She is doing quite well without recurrent tachypalpitations.   She is tolerating [name] without complaint and this had no interval atrial fibrillation of which she is aware.  She denies exercise intolerance chest pain or shortness of breath;  she's had no edema and denies orthopnea or nocturnal dyspnea.        Past Medical History  Diagnosis Date  . Type II or unspecified type diabetes mellitus without mention of complication, not stated as uncontrolled   . Pure hypercholesterolemia   . HTN (hypertension)   . Anemia   . Biliary cirrhosis ([place])   . Diverticulosis of colon with hemorrhage 06/08/2000  . Esophageal stenosis 01/08/11  . Hiatal hernia 01/08/11  . Esophagitis 01/08/11  . Hyperplastic colon polyp 06/08/2000, 07/31/2003, 01/08/11  . Hx of adenomatous colonic polyps 01/08/11  . GERD (gastroesophageal reflux disease)   . Osteopenia   . Cholelithiasis 01/09/12  . Uterine fibroid 01/09/12  . Obesity   . PAF (paroxysmal atrial fibrillation) ([facility])     occurring in the setting of bradycardia and mild first degree AV block followed by Dr. Jens Som  . Arthritis     Past Surgical History  Procedure Laterality Date  . Thyroidectomy, partial  1980  . Tubal ligation  1972  . Breast cyst excision      left  . Colonoscopy    . Polypectomy    . Ventral hernia repair  05/18/2012    Procedure: LAPAROSCOPIC VENTRAL HERNIA;  Surgeon: Adin Hector, MD;  Location: [facility];  Service: General;  Laterality: N/A;  Laparoscopic Ventral Wall Hernia with Mesh  . Insertion of mesh  05/18/2012    Procedure: INSERTION OF  MESH;  Surgeon: Adin Hector, MD;  Location: [facility];  Service: General;  Laterality: N/A;  . Liver biopsy  05/18/2012    Procedure: LIVER BIOPSY;  Surgeon: Adin Hector, MD;  Location: [facility];  Service: General;;    Current Outpatient Prescriptions  Medication Sig Dispense Refill  . calcium-vitamin D (OSCAL WITH D) 250-125 MG-UNIT per tablet Take 1 tablet by mouth daily.    . Ferrous Sulfate (IRON) 325 (65 FE) MG TABS Take 325 mg by mouth daily. 30 each 0  . fexofenadine (ALLEGRA) 180 MG tablet Take 180 mg by mouth daily.     . folic acid (FOLVITE) A999333 MCG tablet Take 400 mcg by mouth daily.    . furosemide (LASIX) 40 MG tablet Take 60 mg by mouth every morning. 1 and 1/2 half 40 mg tablet for a total of 60 mg.    . hydrALAZINE (APRESOLINE) 25 MG tablet Take 25 mg by mouth 2 (two) times daily.    . insulin detemir (LEVEMIR FLEXPEN) 100 UNIT/ML injection Inject 25 Units into the skin at bedtime.     . irbesartan (AVAPRO) 300 MG tablet Take 300 mg by mouth every evening.    Marland Kitchen ketoconazole (NIZORAL) 2 % cream Apply 1 application topically daily as needed. Around skin folds for irritation    . metFORMIN (GLUCOPHAGE) 1000 MG tablet Take 1,000 mg by mouth 2 (two) times daily with a meal.     .  metoprolol (LOPRESSOR) 50 MG tablet Take 25-50 mg by mouth 2 (two) times daily. Take 25 mg in the morning and 50 mg at night    . Multiple Vitamin (MULTIVITAMIN WITH MINERALS) TABS Take 1 tablet by mouth daily.    . pantoprazole (PROTONIX) 40 MG tablet Take 1 tablet (40 mg total) by mouth daily. 90 tablet 3  . propafenone (RYTHMOL SR) 325 MG 12 hr capsule TAKE ONE CAPSULE BY MOUTH TWICE DAILY 180 capsule 3  . simvastatin (ZOCOR) 40 MG tablet Take 40 mg by mouth every evening.     . sitaGLIPtin (JANUVIA) 100 MG tablet Take 100 mg by mouth at bedtime.     . ursodiol (ACTIGALL) 300 MG capsule Take 1 capsule (300 mg total) by mouth 2 (two) times daily. 180 capsule 3  . vitamin C (ASCORBIC ACID) 500 MG tablet  Take 500 mg by mouth daily.    . vitamin E 400 UNIT capsule Take 400 Units by mouth daily.    Marland Kitchen warfarin (COUMADIN) 5 MG tablet TAKE AS DIRECTED BY COUMADIN CLINIC 90 tablet 1   No current facility-administered medications for this visit.    Allergies  Allergen Reactions  . Ace Inhibitors Anaphylaxis  . Capoten [Captopril] Anaphylaxis    Throat swells shut  . Neomycin-Bacitracin Zn-Polymyx Rash    Review of Systems negative except from HPI and PMH  Physical Exam BP 136/78 mmHg  Pulse 58  Ht 5' 2.5" (1.588 m)  Wt 176 lb 9.6 oz (80.105 kg)  BMI 31.77 kg/m2 Well developed and well nourished in no acute distress HENT normal E scleral and icterus clear Neck Supple Of a Clear to ausculation  Regular rate and rhythm, no murmurs gallops or rub Soft with active bowel sounds No clubbing cyanosis none Edema Alert and oriented, grossly normal motor and sensory function Skin Warm and Dry  ECG demonstrates sinus rhythm at 71 with intervals 20/09/40 24  Assessment and  Plan  Atrial fibrillation  Hypertension  She would like to stay on her very complicated regime and is disinclined to pursue consolidation of her various low dose meds  She is also not inclined to try a noac  We'll continue her on her propafenone.  Her sinus bradycardia does not seem to be problematic

## 2015-02-12 NOTE — Patient Instructions (Signed)
Medication Instructions:  Your physician recommends that you continue on your current medications as directed. Please refer to the Current Medication list given to you today.  Labwork: None ordered  Testing/Procedures: None ordered  Follow-Up: Your physician wants you to follow-up in: 1 year with Dr. Klein.  You will receive a reminder letter in the mail two months in advance. If you don't receive a letter, please call our office to schedule the follow-up appointment.  Any Other Special Instructions Will Be Listed Below (If Applicable). Thank you for choosing [facility]!!        

## 2015-02-28 ENCOUNTER — Ambulatory Visit (INDEPENDENT_AMBULATORY_CARE_PROVIDER_SITE_OTHER): Payer: Medicare Other | Admitting: *Deleted

## 2015-02-28 DIAGNOSIS — Z5181 Encounter for therapeutic drug level monitoring: Secondary | ICD-10-CM

## 2015-02-28 DIAGNOSIS — I4891 Unspecified atrial fibrillation: Secondary | ICD-10-CM | POA: Diagnosis not present

## 2015-02-28 DIAGNOSIS — I480 Paroxysmal atrial fibrillation: Secondary | ICD-10-CM

## 2015-02-28 DIAGNOSIS — Z7901 Long term (current) use of anticoagulants: Secondary | ICD-10-CM

## 2015-02-28 LAB — POCT INR: INR: 3

## 2015-03-05 ENCOUNTER — Ambulatory Visit: Payer: Medicare Other | Admitting: Internal Medicine

## 2015-03-07 ENCOUNTER — Telehealth: Payer: Self-pay | Admitting: Internal Medicine

## 2015-03-07 ENCOUNTER — Telehealth: Payer: Self-pay | Admitting: Gastroenterology

## 2015-03-07 MED ORDER — URSODIOL 300 MG PO CAPS: 300.0000 mg | ORAL_CAPSULE | Freq: Two times a day (BID) | ORAL | Status: DC

## 2015-03-07 NOTE — Telephone Encounter (Signed)
°  STAT if patient is at the pharmacy , call can be transferred to refill team.   1. Which medications need to be refilled? Propafenone 325mg    2. Which pharmacy/location is medication to be sent to?[facility]/[place] phone # [phone]  3. Do they need a 30 day or 90 day supply? 90 days

## 2015-03-07 NOTE — Telephone Encounter (Signed)
Prescription sent to patient's pharmacy for 90 day supply. 

## 2015-03-08 NOTE — Telephone Encounter (Signed)
Called Pharmacy to verify rx sent 02/12/15. Called patient to insure rx was sent and will be ready for pick up later today.

## 2015-04-12 ENCOUNTER — Ambulatory Visit (INDEPENDENT_AMBULATORY_CARE_PROVIDER_SITE_OTHER): Payer: Medicare Other | Admitting: Surgery

## 2015-04-12 DIAGNOSIS — I480 Paroxysmal atrial fibrillation: Secondary | ICD-10-CM

## 2015-04-12 DIAGNOSIS — I4891 Unspecified atrial fibrillation: Secondary | ICD-10-CM

## 2015-04-12 DIAGNOSIS — Z7901 Long term (current) use of anticoagulants: Secondary | ICD-10-CM | POA: Diagnosis not present

## 2015-04-12 DIAGNOSIS — Z5181 Encounter for therapeutic drug level monitoring: Secondary | ICD-10-CM | POA: Diagnosis not present

## 2015-04-12 LAB — POCT INR: INR: 2.7

## 2015-04-25 ENCOUNTER — Other Ambulatory Visit (INDEPENDENT_AMBULATORY_CARE_PROVIDER_SITE_OTHER): Payer: Medicare Other

## 2015-04-25 DIAGNOSIS — D509 Iron deficiency anemia, unspecified: Secondary | ICD-10-CM

## 2015-04-25 LAB — CBC WITH DIFFERENTIAL/PLATELET
Basophils Absolute: 0 10*3/uL (ref 0.0–0.1)
Basophils Relative: 0.3 % (ref 0.0–3.0)
Eosinophils Absolute: 0 10*3/uL (ref 0.0–0.7)
Eosinophils Relative: 0.5 % (ref 0.0–5.0)
HCT: 33.5 % — ABNORMAL LOW (ref 36.0–46.0)
Hemoglobin: 11 g/dL — ABNORMAL LOW (ref 12.0–15.0)
Lymphocytes Relative: 33.1 % (ref 12.0–46.0)
Lymphs Abs: 2 10*3/uL (ref 0.7–4.0)
MCHC: 32.7 g/dL (ref 30.0–36.0)
MCV: 88.4 fl (ref 78.0–100.0)
Monocytes Absolute: 0.5 10*3/uL (ref 0.1–1.0)
Monocytes Relative: 7.9 % (ref 3.0–12.0)
Neutro Abs: 3.5 10*3/uL (ref 1.4–7.7)
Neutrophils Relative %: 58.2 % (ref 43.0–77.0)
Platelets: 228 10*3/uL (ref 150.0–400.0)
RBC: 3.79 Mil/uL — ABNORMAL LOW (ref 3.87–5.11)
RDW: 16.7 % — ABNORMAL HIGH (ref 11.5–15.5)
WBC: 6 10*3/uL (ref 4.0–10.5)

## 2015-04-25 LAB — IBC PANEL
Iron: 40 ug/dL — ABNORMAL LOW (ref 42–145)
Saturation Ratios: 9.7 % — ABNORMAL LOW (ref 20.0–50.0)
Transferrin: 295 mg/dL (ref 212.0–360.0)

## 2015-04-25 LAB — FERRITIN: Ferritin: 11.9 ng/mL (ref 10.0–291.0)

## 2015-05-08 ENCOUNTER — Telehealth: Payer: Self-pay | Admitting: Gastroenterology

## 2015-05-08 NOTE — Telephone Encounter (Signed)
A user error has taken place.

## 2015-05-08 NOTE — Telephone Encounter (Signed)
Left message for patient to call back  

## 2015-05-09 NOTE — Telephone Encounter (Signed)
Patient wants [place] to provide more codes for her lab work.  She does not know which labs were denied.  She will contact Medicare for additional information.  She will contact me back

## 2015-05-24 ENCOUNTER — Ambulatory Visit (INDEPENDENT_AMBULATORY_CARE_PROVIDER_SITE_OTHER): Payer: Medicare Other | Admitting: Pharmacist

## 2015-05-24 DIAGNOSIS — I4891 Unspecified atrial fibrillation: Secondary | ICD-10-CM

## 2015-05-24 DIAGNOSIS — Z5181 Encounter for therapeutic drug level monitoring: Secondary | ICD-10-CM

## 2015-05-24 DIAGNOSIS — I480 Paroxysmal atrial fibrillation: Secondary | ICD-10-CM | POA: Diagnosis not present

## 2015-05-24 DIAGNOSIS — Z7901 Long term (current) use of anticoagulants: Secondary | ICD-10-CM | POA: Diagnosis not present

## 2015-05-24 LAB — POCT INR: INR: 3.6

## 2015-05-31 DIAGNOSIS — E119 Type 2 diabetes mellitus without complications: Secondary | ICD-10-CM | POA: Diagnosis not present

## 2015-06-05 ENCOUNTER — Other Ambulatory Visit: Payer: Self-pay | Admitting: *Deleted

## 2015-06-05 MED ORDER — WARFARIN SODIUM 5 MG PO TABS: ORAL_TABLET | ORAL | Status: DC

## 2015-06-05 NOTE — Telephone Encounter (Signed)
Per patient, her pharmacy sent a request to refill this last week and they still have not received a reply. She stated that she last had this refilled in October, and her bottle says that there are no refills remaining.

## 2015-06-07 DIAGNOSIS — E119 Type 2 diabetes mellitus without complications: Secondary | ICD-10-CM | POA: Diagnosis not present

## 2015-06-07 DIAGNOSIS — Z862 Personal history of diseases of the blood and blood-forming organs and certain disorders involving the immune mechanism: Secondary | ICD-10-CM | POA: Diagnosis not present

## 2015-06-07 DIAGNOSIS — J309 Allergic rhinitis, unspecified: Secondary | ICD-10-CM | POA: Diagnosis not present

## 2015-06-07 DIAGNOSIS — K743 Primary biliary cirrhosis: Secondary | ICD-10-CM | POA: Diagnosis not present

## 2015-06-07 DIAGNOSIS — E784 Other hyperlipidemia: Secondary | ICD-10-CM | POA: Diagnosis not present

## 2015-06-07 DIAGNOSIS — I10 Essential (primary) hypertension: Secondary | ICD-10-CM | POA: Diagnosis not present

## 2015-06-07 DIAGNOSIS — K589 Irritable bowel syndrome without diarrhea: Secondary | ICD-10-CM | POA: Diagnosis not present

## 2015-06-07 DIAGNOSIS — Z0000 Encounter for general adult medical examination without abnormal findings: Secondary | ICD-10-CM | POA: Diagnosis not present

## 2015-06-07 DIAGNOSIS — M19041 Primary osteoarthritis, right hand: Secondary | ICD-10-CM | POA: Diagnosis not present

## 2015-06-07 DIAGNOSIS — I4891 Unspecified atrial fibrillation: Secondary | ICD-10-CM | POA: Diagnosis not present

## 2015-06-18 DIAGNOSIS — J019 Acute sinusitis, unspecified: Secondary | ICD-10-CM | POA: Diagnosis not present

## 2015-06-21 ENCOUNTER — Ambulatory Visit (INDEPENDENT_AMBULATORY_CARE_PROVIDER_SITE_OTHER): Payer: Medicare Other | Admitting: *Deleted

## 2015-06-21 DIAGNOSIS — I480 Paroxysmal atrial fibrillation: Secondary | ICD-10-CM | POA: Diagnosis not present

## 2015-06-21 DIAGNOSIS — Z7901 Long term (current) use of anticoagulants: Secondary | ICD-10-CM | POA: Diagnosis not present

## 2015-06-21 DIAGNOSIS — I4891 Unspecified atrial fibrillation: Secondary | ICD-10-CM | POA: Diagnosis not present

## 2015-06-21 DIAGNOSIS — Z5181 Encounter for therapeutic drug level monitoring: Secondary | ICD-10-CM

## 2015-06-21 LAB — POCT INR: INR: 2.3

## 2015-07-19 ENCOUNTER — Ambulatory Visit (INDEPENDENT_AMBULATORY_CARE_PROVIDER_SITE_OTHER): Payer: Medicare Other | Admitting: *Deleted

## 2015-07-19 DIAGNOSIS — Z7901 Long term (current) use of anticoagulants: Secondary | ICD-10-CM | POA: Diagnosis not present

## 2015-07-19 DIAGNOSIS — I4891 Unspecified atrial fibrillation: Secondary | ICD-10-CM

## 2015-07-19 DIAGNOSIS — I480 Paroxysmal atrial fibrillation: Secondary | ICD-10-CM | POA: Diagnosis not present

## 2015-07-19 DIAGNOSIS — Z5181 Encounter for therapeutic drug level monitoring: Secondary | ICD-10-CM

## 2015-07-19 LAB — POCT INR: INR: 2.2

## 2015-07-25 DIAGNOSIS — M25474 Effusion, right foot: Secondary | ICD-10-CM | POA: Diagnosis not present

## 2015-07-25 DIAGNOSIS — M79671 Pain in right foot: Secondary | ICD-10-CM | POA: Diagnosis not present

## 2015-07-25 DIAGNOSIS — M7989 Other specified soft tissue disorders: Secondary | ICD-10-CM | POA: Diagnosis not present

## 2015-08-23 ENCOUNTER — Ambulatory Visit (INDEPENDENT_AMBULATORY_CARE_PROVIDER_SITE_OTHER): Payer: Medicare Other | Admitting: *Deleted

## 2015-08-23 DIAGNOSIS — I480 Paroxysmal atrial fibrillation: Secondary | ICD-10-CM | POA: Diagnosis not present

## 2015-08-23 DIAGNOSIS — Z5181 Encounter for therapeutic drug level monitoring: Secondary | ICD-10-CM

## 2015-08-23 DIAGNOSIS — I4891 Unspecified atrial fibrillation: Secondary | ICD-10-CM

## 2015-08-23 DIAGNOSIS — Z7901 Long term (current) use of anticoagulants: Secondary | ICD-10-CM | POA: Diagnosis not present

## 2015-08-23 LAB — POCT INR: INR: 2.4

## 2015-09-08 DIAGNOSIS — M25551 Pain in right hip: Secondary | ICD-10-CM | POA: Diagnosis not present

## 2015-09-10 DIAGNOSIS — M25551 Pain in right hip: Secondary | ICD-10-CM | POA: Diagnosis not present

## 2015-09-10 DIAGNOSIS — J329 Chronic sinusitis, unspecified: Secondary | ICD-10-CM | POA: Diagnosis not present

## 2015-09-10 DIAGNOSIS — M5441 Lumbago with sciatica, right side: Secondary | ICD-10-CM | POA: Diagnosis not present

## 2015-09-10 DIAGNOSIS — S32000A Wedge compression fracture of unspecified lumbar vertebra, initial encounter for closed fracture: Secondary | ICD-10-CM | POA: Diagnosis not present

## 2015-09-14 DIAGNOSIS — S30820A Blister (nonthermal) of lower back and pelvis, initial encounter: Secondary | ICD-10-CM | POA: Diagnosis not present

## 2015-09-14 DIAGNOSIS — S32000D Wedge compression fracture of unspecified lumbar vertebra, subsequent encounter for fracture with routine healing: Secondary | ICD-10-CM | POA: Diagnosis not present

## 2015-09-14 DIAGNOSIS — J329 Chronic sinusitis, unspecified: Secondary | ICD-10-CM | POA: Diagnosis not present

## 2015-09-17 DIAGNOSIS — M1811 Unilateral primary osteoarthritis of first carpometacarpal joint, right hand: Secondary | ICD-10-CM | POA: Insufficient documentation

## 2015-09-17 DIAGNOSIS — E119 Type 2 diabetes mellitus without complications: Secondary | ICD-10-CM | POA: Diagnosis not present

## 2015-09-17 DIAGNOSIS — E785 Hyperlipidemia, unspecified: Secondary | ICD-10-CM

## 2015-09-17 DIAGNOSIS — J309 Allergic rhinitis, unspecified: Secondary | ICD-10-CM | POA: Insufficient documentation

## 2015-09-17 DIAGNOSIS — I10 Essential (primary) hypertension: Secondary | ICD-10-CM | POA: Diagnosis not present

## 2015-09-17 DIAGNOSIS — R946 Abnormal results of thyroid function studies: Secondary | ICD-10-CM | POA: Diagnosis not present

## 2015-09-17 DIAGNOSIS — S32000D Wedge compression fracture of unspecified lumbar vertebra, subsequent encounter for fracture with routine healing: Secondary | ICD-10-CM | POA: Diagnosis not present

## 2015-09-17 DIAGNOSIS — D485 Neoplasm of uncertain behavior of skin: Secondary | ICD-10-CM | POA: Diagnosis not present

## 2015-09-17 DIAGNOSIS — R7989 Other specified abnormal findings of blood chemistry: Secondary | ICD-10-CM | POA: Diagnosis not present

## 2015-09-17 HISTORY — DX: Hyperlipidemia, unspecified: E78.5

## 2015-09-17 HISTORY — DX: Allergic rhinitis, unspecified: J30.9

## 2015-09-17 HISTORY — DX: Unilateral primary osteoarthritis of first carpometacarpal joint, right hand: M18.11

## 2015-10-02 ENCOUNTER — Encounter: Payer: Self-pay | Admitting: Gastroenterology

## 2015-10-04 ENCOUNTER — Ambulatory Visit (INDEPENDENT_AMBULATORY_CARE_PROVIDER_SITE_OTHER): Payer: Medicare Other | Admitting: *Deleted

## 2015-10-04 DIAGNOSIS — Z5181 Encounter for therapeutic drug level monitoring: Secondary | ICD-10-CM

## 2015-10-04 DIAGNOSIS — I4891 Unspecified atrial fibrillation: Secondary | ICD-10-CM

## 2015-10-04 DIAGNOSIS — Z7901 Long term (current) use of anticoagulants: Secondary | ICD-10-CM | POA: Diagnosis not present

## 2015-10-04 DIAGNOSIS — I480 Paroxysmal atrial fibrillation: Secondary | ICD-10-CM | POA: Diagnosis not present

## 2015-10-04 LAB — POCT INR: INR: 2.9

## 2015-10-24 ENCOUNTER — Telehealth: Payer: Self-pay | Admitting: Gastroenterology

## 2015-10-24 ENCOUNTER — Other Ambulatory Visit: Payer: Self-pay | Admitting: Family Medicine

## 2015-10-24 DIAGNOSIS — Z1231 Encounter for screening mammogram for malignant neoplasm of breast: Secondary | ICD-10-CM

## 2015-10-24 DIAGNOSIS — K743 Primary biliary cirrhosis: Secondary | ICD-10-CM

## 2015-10-24 NOTE — Telephone Encounter (Signed)
Dr. Fuller Plan would you like her to have labs prior to her next appt with you in September?

## 2015-10-26 NOTE — Telephone Encounter (Signed)
Pt notified to have labs and the order is in EPIC

## 2015-10-26 NOTE — Telephone Encounter (Signed)
Yes. CMP, CBC, PT/INR 1-2 weeks before REV

## 2015-11-15 ENCOUNTER — Ambulatory Visit (INDEPENDENT_AMBULATORY_CARE_PROVIDER_SITE_OTHER): Payer: Medicare Other | Admitting: *Deleted

## 2015-11-15 DIAGNOSIS — Z7901 Long term (current) use of anticoagulants: Secondary | ICD-10-CM | POA: Diagnosis not present

## 2015-11-15 DIAGNOSIS — I4891 Unspecified atrial fibrillation: Secondary | ICD-10-CM | POA: Diagnosis not present

## 2015-11-15 DIAGNOSIS — Z5181 Encounter for therapeutic drug level monitoring: Secondary | ICD-10-CM

## 2015-11-15 DIAGNOSIS — I480 Paroxysmal atrial fibrillation: Secondary | ICD-10-CM

## 2015-11-15 LAB — POCT INR: INR: 2.6

## 2015-11-27 ENCOUNTER — Other Ambulatory Visit: Payer: Self-pay | Admitting: Gastroenterology

## 2015-11-27 ENCOUNTER — Other Ambulatory Visit: Payer: Self-pay | Admitting: Internal Medicine

## 2015-12-26 ENCOUNTER — Ambulatory Visit (INDEPENDENT_AMBULATORY_CARE_PROVIDER_SITE_OTHER): Payer: Medicare Other | Admitting: Pharmacist

## 2015-12-26 DIAGNOSIS — Z5181 Encounter for therapeutic drug level monitoring: Secondary | ICD-10-CM | POA: Diagnosis not present

## 2015-12-26 DIAGNOSIS — Z7901 Long term (current) use of anticoagulants: Secondary | ICD-10-CM | POA: Diagnosis not present

## 2015-12-26 DIAGNOSIS — I4891 Unspecified atrial fibrillation: Secondary | ICD-10-CM | POA: Diagnosis not present

## 2015-12-26 LAB — POCT INR: INR: 2.1

## 2016-01-10 DIAGNOSIS — H43813 Vitreous degeneration, bilateral: Secondary | ICD-10-CM

## 2016-01-10 DIAGNOSIS — H33301 Unspecified retinal break, right eye: Secondary | ICD-10-CM | POA: Diagnosis not present

## 2016-01-10 DIAGNOSIS — E119 Type 2 diabetes mellitus without complications: Secondary | ICD-10-CM | POA: Diagnosis not present

## 2016-01-10 DIAGNOSIS — H2513 Age-related nuclear cataract, bilateral: Secondary | ICD-10-CM | POA: Diagnosis not present

## 2016-01-10 HISTORY — DX: Vitreous degeneration, bilateral: H43.813

## 2016-01-28 DIAGNOSIS — E7800 Pure hypercholesterolemia, unspecified: Secondary | ICD-10-CM | POA: Diagnosis not present

## 2016-01-28 DIAGNOSIS — E119 Type 2 diabetes mellitus without complications: Secondary | ICD-10-CM | POA: Diagnosis not present

## 2016-01-28 DIAGNOSIS — R946 Abnormal results of thyroid function studies: Secondary | ICD-10-CM | POA: Diagnosis not present

## 2016-01-28 DIAGNOSIS — I10 Essential (primary) hypertension: Secondary | ICD-10-CM | POA: Diagnosis not present

## 2016-01-31 ENCOUNTER — Other Ambulatory Visit (INDEPENDENT_AMBULATORY_CARE_PROVIDER_SITE_OTHER): Payer: Medicare Other

## 2016-01-31 DIAGNOSIS — K743 Primary biliary cirrhosis: Secondary | ICD-10-CM

## 2016-01-31 LAB — COMPREHENSIVE METABOLIC PANEL
ALT: 20 U/L (ref 0–35)
AST: 21 U/L (ref 0–37)
Albumin: 3.7 g/dL (ref 3.5–5.2)
Alkaline Phosphatase: 103 U/L (ref 39–117)
BUN: 21 mg/dL (ref 6–23)
CO2: 29 mEq/L (ref 19–32)
Calcium: 9 mg/dL (ref 8.4–10.5)
Chloride: 100 mEq/L (ref 96–112)
Creatinine, Ser: 0.94 mg/dL (ref 0.40–1.20)
GFR: 61.02 mL/min (ref >60.00)
Glucose, Bld: 209 mg/dL — ABNORMAL HIGH (ref 70–99)
Potassium: 4.2 mEq/L (ref 3.5–5.1)
Sodium: 138 mEq/L (ref 135–145)
Total Bilirubin: 0.4 mg/dL (ref 0.2–1.2)
Total Protein: 7.4 g/dL (ref 6.0–8.3)

## 2016-01-31 LAB — CBC WITH DIFFERENTIAL/PLATELET
Basophils Absolute: 0 10*3/uL (ref 0.0–0.1)
Basophils Relative: 0.3 % (ref 0.0–3.0)
Eosinophils Absolute: 0 10*3/uL (ref 0.0–0.7)
Eosinophils Relative: 0.7 % (ref 0.0–5.0)
HCT: 33.8 % — ABNORMAL LOW (ref 36.0–46.0)
Hemoglobin: 11.4 g/dL — ABNORMAL LOW (ref 12.0–15.0)
Lymphocytes Relative: 30.8 % (ref 12.0–46.0)
Lymphs Abs: 1.9 10*3/uL (ref 0.7–4.0)
MCHC: 33.9 g/dL (ref 30.0–36.0)
MCV: 87.3 fl (ref 78.0–100.0)
Monocytes Absolute: 0.5 10*3/uL (ref 0.1–1.0)
Monocytes Relative: 8.8 % (ref 3.0–12.0)
Neutro Abs: 3.6 10*3/uL (ref 1.4–7.7)
Neutrophils Relative %: 59.4 % (ref 43.0–77.0)
Platelets: 256 10*3/uL (ref 150.0–400.0)
RBC: 3.87 Mil/uL (ref 3.87–5.11)
RDW: 14.7 % (ref 11.5–15.5)
WBC: 6.1 10*3/uL (ref 4.0–10.5)

## 2016-01-31 LAB — PROTIME-INR
INR: 3 ratio — ABNORMAL HIGH (ref 0.8–1.0)
Prothrombin Time: 33 s — ABNORMAL HIGH (ref 9.6–13.1)

## 2016-02-04 ENCOUNTER — Other Ambulatory Visit: Payer: Self-pay

## 2016-02-04 DIAGNOSIS — I10 Essential (primary) hypertension: Secondary | ICD-10-CM | POA: Diagnosis not present

## 2016-02-04 DIAGNOSIS — I480 Paroxysmal atrial fibrillation: Secondary | ICD-10-CM | POA: Diagnosis not present

## 2016-02-04 DIAGNOSIS — K743 Primary biliary cirrhosis: Secondary | ICD-10-CM | POA: Diagnosis not present

## 2016-02-04 DIAGNOSIS — D509 Iron deficiency anemia, unspecified: Secondary | ICD-10-CM | POA: Diagnosis not present

## 2016-02-04 DIAGNOSIS — E119 Type 2 diabetes mellitus without complications: Secondary | ICD-10-CM | POA: Diagnosis not present

## 2016-02-04 DIAGNOSIS — Z8719 Personal history of other diseases of the digestive system: Secondary | ICD-10-CM | POA: Diagnosis not present

## 2016-02-04 DIAGNOSIS — E785 Hyperlipidemia, unspecified: Secondary | ICD-10-CM | POA: Diagnosis not present

## 2016-02-04 DIAGNOSIS — J309 Allergic rhinitis, unspecified: Secondary | ICD-10-CM | POA: Diagnosis not present

## 2016-02-05 ENCOUNTER — Ambulatory Visit: Payer: Medicare Other | Admitting: Gastroenterology

## 2016-02-07 ENCOUNTER — Ambulatory Visit: Payer: Medicare Other

## 2016-02-07 ENCOUNTER — Ambulatory Visit (INDEPENDENT_AMBULATORY_CARE_PROVIDER_SITE_OTHER): Payer: Medicare Other | Admitting: Gastroenterology

## 2016-02-07 ENCOUNTER — Other Ambulatory Visit: Payer: Self-pay | Admitting: Gastroenterology

## 2016-02-07 ENCOUNTER — Ambulatory Visit
Admission: RE | Admit: 2016-02-07 | Discharge: 2016-02-07 | Disposition: A | Discharge status: Home / Self Care | Payer: Medicare Other | Source: Ambulatory Visit | Attending: Family Medicine | Admitting: Family Medicine

## 2016-02-07 ENCOUNTER — Encounter: Payer: Self-pay | Admitting: Gastroenterology

## 2016-02-07 ENCOUNTER — Ambulatory Visit (INDEPENDENT_AMBULATORY_CARE_PROVIDER_SITE_OTHER): Payer: Medicare Other | Admitting: *Deleted

## 2016-02-07 VITALS — BP 128/68 | HR 68 | Ht 62.0 in | Wt 176.4 lb

## 2016-02-07 DIAGNOSIS — D509 Iron deficiency anemia, unspecified: Secondary | ICD-10-CM | POA: Diagnosis not present

## 2016-02-07 DIAGNOSIS — I4891 Unspecified atrial fibrillation: Secondary | ICD-10-CM | POA: Diagnosis not present

## 2016-02-07 DIAGNOSIS — Z86010 Personal history of colonic polyps: Secondary | ICD-10-CM

## 2016-02-07 DIAGNOSIS — Z5181 Encounter for therapeutic drug level monitoring: Secondary | ICD-10-CM | POA: Diagnosis not present

## 2016-02-07 DIAGNOSIS — K743 Primary biliary cirrhosis: Secondary | ICD-10-CM | POA: Diagnosis not present

## 2016-02-07 DIAGNOSIS — Z1231 Encounter for screening mammogram for malignant neoplasm of breast: Secondary | ICD-10-CM

## 2016-02-07 DIAGNOSIS — Z7901 Long term (current) use of anticoagulants: Secondary | ICD-10-CM | POA: Diagnosis not present

## 2016-02-07 LAB — POCT INR: INR: 3.4

## 2016-02-07 MED ORDER — URSODIOL 300 MG PO CAPS: ORAL_CAPSULE | ORAL | 3 refills | Status: DC

## 2016-02-07 NOTE — Patient Instructions (Signed)
We have sent the following medications to your pharmacy for you to pick up at your convenience: actigall 300 mg twice daily  You have been scheduled for an abdominal ultrasound at [facility] (1st floor of hospital) on Wednesday 02/13/16 at 8:00 am. Please arrive 15 minutes prior to your appointment for registration. Make certain not to have anything to eat or drink 6 hours prior to your appointment. Should you need to reschedule your appointment, please contact radiology at [phone]. This test typically takes about 30 minutes to perform.  Please follow up with Dr Fuller Plan in 1 year.  CC: Dr [name] [facility])  If you are age 79 or older, your body mass index should be between 23-30. Your Body mass index is 32.26 kg/m. If this is out of the aforementioned range listed, please consider follow up with your Primary Care Provider.  If you are age 79 or younger, your body mass index should be between 19-25. Your Body mass index is 32.26 kg/m. If this is out of the aformentioned range listed, please consider follow up with your Primary Care Provider.

## 2016-02-07 NOTE — Progress Notes (Signed)
    History of Present Illness: This is a 81 year old female returning for follow-up of primary biliary cirrhosis and steatohepatitis. Blood work performed last week was reviewed in detail with the patient and showed a mild normocytic anemia, elevated glucose normal LFTs. INR=3.0 on Coumadin. She has no gastrointestinal complaints.  Current Medications, Allergies, Past Medical History, Past Surgical History, Family History and Social History were reviewed in [employer] record.  Physical Exam: General: Well developed, well nourished, no acute distress Head: Normocephalic and atraumatic Eyes:  sclerae anicteric, EOMI Ears: Normal auditory acuity Mouth: No deformity or lesions Lungs: Clear throughout to auscultation Heart: Regular rate and rhythm; no murmurs, rubs or bruits Abdomen: Soft, non tender and non distended. No masses, hepatosplenomegaly or hernias noted. Normal Bowel sounds Musculoskeletal: Symmetrical with no gross deformities  Pulses:  Normal pulses noted Extremities: No clubbing, cyanosis, edema or deformities noted Neurological: Alert oriented x 4, grossly nonfocal Psychological:  Alert and cooperative. Normal mood and affect  Assessment and Recommendations:  1. PBC and steatohepatitis. Continue Actigall 300 mg po bid. Schedule abdominal ultrasound for [place] screening. Given age, anticoagulation needs, stable LFTs and platelets and comorbidities we will not plan for EGD to screen for varices and she is in agreement with this plan. Repeat US in 6 months. REV in 1 year  2. History of GU, erosive gastritis, GERD. Continue pantoprazole 40 mg daily.  3. Personal history of adenomatous colon polyps. Given age, last colonoscopy findings, anticoagulation needed and comorbidities I recommended to discontinue routine surveillance colonoscopies and she is in agreement.    4. PAF, chronic Coumadin.   5. Iron deficiency anemia. She remains mildly anemic on iron  daily. Follow-up with PCP for further evaluation and treatment.    cc: [name], DO

## 2016-02-08 ENCOUNTER — Telehealth: Payer: Self-pay | Admitting: Gastroenterology

## 2016-02-08 ENCOUNTER — Other Ambulatory Visit: Payer: Self-pay | Admitting: Internal Medicine

## 2016-02-08 NOTE — Telephone Encounter (Signed)
We were able to figure out where Dr Theda Sers is practicing yesterday and Dr Fuller Plan has already reviewed the records.

## 2016-02-13 ENCOUNTER — Ambulatory Visit (HOSPITAL_COMMUNITY): Payer: Medicare Other

## 2016-02-14 ENCOUNTER — Ambulatory Visit (HOSPITAL_COMMUNITY)
Admission: RE | Admit: 2016-02-14 | Discharge: 2016-02-14 | Disposition: A | Discharge status: Home / Self Care | Payer: Medicare Other | Source: Ambulatory Visit | Attending: Gastroenterology | Admitting: Gastroenterology

## 2016-02-14 DIAGNOSIS — K743 Primary biliary cirrhosis: Secondary | ICD-10-CM | POA: Diagnosis present

## 2016-02-14 DIAGNOSIS — K8020 Calculus of gallbladder without cholecystitis without obstruction: Secondary | ICD-10-CM | POA: Diagnosis not present

## 2016-02-14 DIAGNOSIS — K7460 Unspecified cirrhosis of liver: Secondary | ICD-10-CM | POA: Insufficient documentation

## 2016-02-20 ENCOUNTER — Ambulatory Visit: Payer: Medicare Other | Admitting: Internal Medicine

## 2016-03-06 ENCOUNTER — Encounter: Payer: Self-pay | Admitting: Internal Medicine

## 2016-03-06 ENCOUNTER — Ambulatory Visit (INDEPENDENT_AMBULATORY_CARE_PROVIDER_SITE_OTHER): Payer: Medicare Other | Admitting: *Deleted

## 2016-03-06 DIAGNOSIS — Z5181 Encounter for therapeutic drug level monitoring: Secondary | ICD-10-CM

## 2016-03-06 DIAGNOSIS — I4891 Unspecified atrial fibrillation: Secondary | ICD-10-CM | POA: Diagnosis not present

## 2016-03-06 DIAGNOSIS — Z7901 Long term (current) use of anticoagulants: Secondary | ICD-10-CM | POA: Diagnosis not present

## 2016-03-06 LAB — POCT INR: INR: 2.1

## 2016-03-13 DIAGNOSIS — Z01419 Encounter for gynecological examination (general) (routine) without abnormal findings: Secondary | ICD-10-CM | POA: Diagnosis not present

## 2016-03-14 DIAGNOSIS — Z23 Encounter for immunization: Secondary | ICD-10-CM | POA: Diagnosis not present

## 2016-03-16 DIAGNOSIS — J0100 Acute maxillary sinusitis, unspecified: Secondary | ICD-10-CM | POA: Diagnosis not present

## 2016-03-19 ENCOUNTER — Ambulatory Visit (INDEPENDENT_AMBULATORY_CARE_PROVIDER_SITE_OTHER): Payer: Medicare Other | Admitting: Internal Medicine

## 2016-03-19 ENCOUNTER — Encounter (INDEPENDENT_AMBULATORY_CARE_PROVIDER_SITE_OTHER): Payer: Self-pay

## 2016-03-19 VITALS — BP 128/76 | HR 72 | Ht 62.0 in | Wt 176.4 lb

## 2016-03-19 DIAGNOSIS — I480 Paroxysmal atrial fibrillation: Secondary | ICD-10-CM | POA: Diagnosis not present

## 2016-03-19 MED ORDER — PROPAFENONE HCL ER 325 MG PO CP12: ORAL_CAPSULE | ORAL | 3 refills | Status: DC

## 2016-03-19 NOTE — Progress Notes (Signed)
Patient Care Team: [name], DO as PCP - General (Family Medicine) [name], MD as Consulting Physician (Gastroenterology) [name], MD as Consulting Physician (Cardiology)   HPI  Sara Hancock is a 81 y.o. female seen in followup for atrial fibrillation that is paroxysmal. It occurs in the setting of background bradycardia and mild first-degree AV block. She is doing quite well without recurrent tachypalpitations.   She is tolerating [name] without complaint and this had no interval atrial fibrillation of which she is aware.  She has however noticed some slow heartbeats. These are associated with a sensation in her chest. She takes her pulse and notes that it slow and somewhat irregular  She denies exercise intolerance chest pain or shortness of breath;  she's had no edema and denies orthopnea or nocturnal dyspnea.      DATE PR interval QRSduration Dose  9/15 200 98 325  11/117 200 96 325     Past Medical History:  Diagnosis Date  . Anemia   . Arthritis   . Biliary cirrhosis ([place])   . Cholelithiasis 01/09/12  . Diverticulosis of colon with hemorrhage 06/08/2000  . Esophageal stenosis 01/08/11  . Esophagitis 01/08/11  . GERD (gastroesophageal reflux disease)   . Hiatal hernia 01/08/11  . HTN (hypertension)   . Hx of adenomatous colonic polyps 01/08/11  . Hyperplastic colon polyp 06/08/2000, 07/31/2003, 01/08/11  . Obesity   . Osteopenia   . PAF (paroxysmal atrial fibrillation) (HCC)    occurring in the setting of bradycardia and mild first degree AV block followed by Dr. Jens Som  . Pure hypercholesterolemia   . Type II or unspecified type diabetes mellitus without mention of complication, not stated as uncontrolled   . Uterine fibroid 01/09/12    Past Surgical History:  Procedure Laterality Date  . BREAST CYST EXCISION     left  . COLONOSCOPY    . INSERTION OF MESH  05/18/2012   Procedure: INSERTION OF MESH;  Surgeon: Adin Hector, MD;  Location:  [facility];  Service: General;  Laterality: N/A;  . LIVER BIOPSY  05/18/2012   Procedure: LIVER BIOPSY;  Surgeon: Adin Hector, MD;  Location: [facility];  Service: General;;  . POLYPECTOMY    . THYROIDECTOMY, PARTIAL  1980  . TUBAL LIGATION  1972  . VENTRAL HERNIA REPAIR  05/18/2012   Procedure: LAPAROSCOPIC VENTRAL HERNIA;  Surgeon: Adin Hector, MD;  Location: [facility];  Service: General;  Laterality: N/A;  Laparoscopic Ventral Wall Hernia with Mesh    Current Outpatient Prescriptions  Medication Sig Dispense Refill  . calcium-vitamin D (OSCAL WITH D) 250-125 MG-UNIT per tablet Take 1 tablet by mouth daily.    . Ferrous Sulfate (IRON) 325 (65 FE) MG TABS Take 325 mg by mouth daily. 30 each 0  . fexofenadine (ALLEGRA) 180 MG tablet Take 180 mg by mouth daily.     . folic acid (FOLVITE) 122 MCG tablet Take 400 mcg by mouth daily.    . furosemide (LASIX) 40 MG tablet Take 60 mg by mouth every morning. 1 and 1/2 half 40 mg tablet for a total of 60 mg.    . hydrALAZINE (APRESOLINE) 25 MG tablet Take 25 mg by mouth 2 (two) times daily.    . insulin detemir (LEVEMIR FLEXPEN) 100 UNIT/ML injection Inject 25 Units into the skin at bedtime.     . irbesartan (AVAPRO) 300 MG tablet Take 300 mg by mouth every evening.    Marland Kitchen  ketoconazole (NIZORAL) 2 % cream Apply 1 application topically daily as needed. Around skin folds for irritation    . metFORMIN (GLUCOPHAGE) 1000 MG tablet Take 1,000 mg by mouth 2 (two) times daily with a meal.     . metoprolol (LOPRESSOR) 50 MG tablet Take 25-50 mg by mouth 2 (two) times daily. Take 25 mg in the morning and 50 mg at night    . Multiple Vitamin (MULTIVITAMIN WITH MINERALS) TABS Take 1 tablet by mouth daily.    . pantoprazole (PROTONIX) 40 MG tablet TAKE 1 TABLET(40 MG) BY MOUTH DAILY 90 tablet 3  . propafenone (RYTHMOL SR) 325 MG 12 hr capsule TAKE 1 CAPSULE(325 MG) BY MOUTH TWICE DAILY 180 capsule 0  . simvastatin (ZOCOR) 40 MG tablet Take 40 mg by mouth every evening.      . sitaGLIPtin (JANUVIA) 100 MG tablet Take 100 mg by mouth at bedtime.     . ursodiol (ACTIGALL) 300 MG capsule TAKE 1 CAPSULE(300 MG) BY MOUTH TWICE DAILY 180 capsule 3  . vitamin C (ASCORBIC ACID) 500 MG tablet Take 500 mg by mouth daily.    . vitamin E 400 UNIT capsule Take 400 Units by mouth daily.    Marland Kitchen warfarin (COUMADIN) 5 MG tablet TAKE AS DIRECTED BY COUMADIN CLINIC 90 tablet 0   No current facility-administered medications for this visit.     Allergies  Allergen Reactions  . Ace Inhibitors Anaphylaxis  . Capoten [Captopril] Anaphylaxis    Throat swells shut  . Neomycin-Bacitracin Zn-Polymyx Rash    Review of Systems negative except from HPI and PMH  Physical Exam BP 128/76   Pulse 72   Ht 5\' 2"  (1.575 m)   Wt 176 lb 6.4 oz (80 kg)   SpO2 98%   BMI 32.26 kg/m  Well developed and well nourished in no acute distress HENT normal E scleral and icterus clear Neck Supple Of a Clear to ausculation  Regular rate and rhythm, no murmurs gallops or rub Soft with active bowel sounds No clubbing cyanosis none Edema Alert and oriented, grossly normal motor and sensory function Skin Warm and Dry  ECG demonstrates sinus rhythm at 68 20/10/41     Assessment and  Plan  Atrial fibrillation  Hypertension  Brady palpitations   With BP well controlled , we will hold her hydralazine to see how she does BP wise  I am not sure of the cause of her bradypalps, the differential includes PVCs/PACs and sinus dysfunction.  If they persist will need either alivecor or ILR.    She is also not inclined to try a noac  We'll continue her on her propafenone.  QRS is stable   Her sinus bradycardia does not seem to be problematic

## 2016-03-19 NOTE — Patient Instructions (Signed)
Medication Instructions: - Your physician has recommended you make the following change in your medication:  1) Hold hydralazine (may restart if the top number for your blood pressure is >140   Labwork: - none ordered  Procedures/Testing: - none ordered  Follow-Up: - Your physician wants you to follow-up in: 6 months with Roderic Palau, NP in the a-fib clinic.  You will receive a reminder letter in the mail two months in advance. If you don't receive a letter, please call our office to schedule the follow-up appointment.  Any Additional Special Instructions Will Be Listed Below (If Applicable).     If you need a refill on your cardiac medications before your next appointment, please call your pharmacy.

## 2016-04-10 ENCOUNTER — Ambulatory Visit (INDEPENDENT_AMBULATORY_CARE_PROVIDER_SITE_OTHER): Payer: Medicare Other | Admitting: *Deleted

## 2016-04-10 DIAGNOSIS — Z7901 Long term (current) use of anticoagulants: Secondary | ICD-10-CM

## 2016-04-10 DIAGNOSIS — I4891 Unspecified atrial fibrillation: Secondary | ICD-10-CM | POA: Diagnosis not present

## 2016-04-10 DIAGNOSIS — Z5181 Encounter for therapeutic drug level monitoring: Secondary | ICD-10-CM | POA: Diagnosis not present

## 2016-04-10 LAB — POCT INR: INR: 2.9

## 2016-04-30 ENCOUNTER — Other Ambulatory Visit: Payer: Self-pay | Admitting: Internal Medicine

## 2016-05-08 ENCOUNTER — Ambulatory Visit (INDEPENDENT_AMBULATORY_CARE_PROVIDER_SITE_OTHER): Payer: Medicare Other | Admitting: *Deleted

## 2016-05-08 DIAGNOSIS — I4891 Unspecified atrial fibrillation: Secondary | ICD-10-CM

## 2016-05-08 DIAGNOSIS — Z5181 Encounter for therapeutic drug level monitoring: Secondary | ICD-10-CM

## 2016-05-08 DIAGNOSIS — Z7901 Long term (current) use of anticoagulants: Secondary | ICD-10-CM | POA: Diagnosis not present

## 2016-05-08 LAB — POCT INR: INR: 1.7

## 2016-06-02 ENCOUNTER — Ambulatory Visit (INDEPENDENT_AMBULATORY_CARE_PROVIDER_SITE_OTHER): Payer: Medicare Other | Admitting: *Deleted

## 2016-06-02 DIAGNOSIS — I4891 Unspecified atrial fibrillation: Secondary | ICD-10-CM

## 2016-06-02 DIAGNOSIS — Z5181 Encounter for therapeutic drug level monitoring: Secondary | ICD-10-CM

## 2016-06-02 DIAGNOSIS — Z7901 Long term (current) use of anticoagulants: Secondary | ICD-10-CM

## 2016-06-02 LAB — POCT INR: INR: 3.7

## 2016-06-05 DIAGNOSIS — Z1329 Encounter for screening for other suspected endocrine disorder: Secondary | ICD-10-CM | POA: Diagnosis not present

## 2016-06-05 DIAGNOSIS — Z79899 Other long term (current) drug therapy: Secondary | ICD-10-CM | POA: Diagnosis not present

## 2016-06-05 DIAGNOSIS — E119 Type 2 diabetes mellitus without complications: Secondary | ICD-10-CM | POA: Diagnosis not present

## 2016-06-05 DIAGNOSIS — E785 Hyperlipidemia, unspecified: Secondary | ICD-10-CM | POA: Diagnosis not present

## 2016-06-12 DIAGNOSIS — I10 Essential (primary) hypertension: Secondary | ICD-10-CM | POA: Diagnosis not present

## 2016-06-12 DIAGNOSIS — J309 Allergic rhinitis, unspecified: Secondary | ICD-10-CM | POA: Diagnosis not present

## 2016-06-12 DIAGNOSIS — E785 Hyperlipidemia, unspecified: Secondary | ICD-10-CM | POA: Diagnosis not present

## 2016-06-12 DIAGNOSIS — E119 Type 2 diabetes mellitus without complications: Secondary | ICD-10-CM | POA: Diagnosis not present

## 2016-06-12 DIAGNOSIS — D509 Iron deficiency anemia, unspecified: Secondary | ICD-10-CM | POA: Diagnosis not present

## 2016-06-12 DIAGNOSIS — Z0000 Encounter for general adult medical examination without abnormal findings: Secondary | ICD-10-CM | POA: Diagnosis not present

## 2016-06-12 DIAGNOSIS — I480 Paroxysmal atrial fibrillation: Secondary | ICD-10-CM | POA: Diagnosis not present

## 2016-06-12 DIAGNOSIS — K743 Primary biliary cirrhosis: Secondary | ICD-10-CM | POA: Diagnosis not present

## 2016-06-19 ENCOUNTER — Ambulatory Visit (INDEPENDENT_AMBULATORY_CARE_PROVIDER_SITE_OTHER): Payer: Medicare Other | Admitting: *Deleted

## 2016-06-19 DIAGNOSIS — Z5181 Encounter for therapeutic drug level monitoring: Secondary | ICD-10-CM | POA: Diagnosis not present

## 2016-06-19 DIAGNOSIS — Z7901 Long term (current) use of anticoagulants: Secondary | ICD-10-CM | POA: Diagnosis not present

## 2016-06-19 DIAGNOSIS — I4891 Unspecified atrial fibrillation: Secondary | ICD-10-CM

## 2016-06-19 LAB — POCT INR: INR: 2.8

## 2016-07-09 ENCOUNTER — Ambulatory Visit (INDEPENDENT_AMBULATORY_CARE_PROVIDER_SITE_OTHER): Payer: Medicare Other | Admitting: *Deleted

## 2016-07-09 DIAGNOSIS — I4891 Unspecified atrial fibrillation: Secondary | ICD-10-CM | POA: Diagnosis not present

## 2016-07-09 DIAGNOSIS — Z7901 Long term (current) use of anticoagulants: Secondary | ICD-10-CM | POA: Diagnosis not present

## 2016-07-09 DIAGNOSIS — Z5181 Encounter for therapeutic drug level monitoring: Secondary | ICD-10-CM | POA: Diagnosis not present

## 2016-07-09 LAB — POCT INR: INR: 3.1

## 2016-07-15 ENCOUNTER — Encounter (HOSPITAL_COMMUNITY): Payer: Self-pay | Admitting: *Deleted

## 2016-07-25 ENCOUNTER — Other Ambulatory Visit: Payer: Self-pay | Admitting: Internal Medicine

## 2016-07-29 ENCOUNTER — Ambulatory Visit (INDEPENDENT_AMBULATORY_CARE_PROVIDER_SITE_OTHER): Payer: Medicare Other | Admitting: *Deleted

## 2016-07-29 DIAGNOSIS — Z7901 Long term (current) use of anticoagulants: Secondary | ICD-10-CM

## 2016-07-29 DIAGNOSIS — I4891 Unspecified atrial fibrillation: Secondary | ICD-10-CM

## 2016-07-29 DIAGNOSIS — Z5181 Encounter for therapeutic drug level monitoring: Secondary | ICD-10-CM | POA: Diagnosis not present

## 2016-07-29 LAB — POCT INR: INR: 3.2

## 2016-08-13 ENCOUNTER — Other Ambulatory Visit: Payer: Self-pay

## 2016-08-13 MED ORDER — PROPAFENONE HCL ER 325 MG PO CP12: ORAL_CAPSULE | ORAL | 11 refills | Status: DC

## 2016-08-14 ENCOUNTER — Ambulatory Visit (INDEPENDENT_AMBULATORY_CARE_PROVIDER_SITE_OTHER): Payer: Medicare Other | Admitting: *Deleted

## 2016-08-14 DIAGNOSIS — I4891 Unspecified atrial fibrillation: Secondary | ICD-10-CM

## 2016-08-14 DIAGNOSIS — Z5181 Encounter for therapeutic drug level monitoring: Secondary | ICD-10-CM

## 2016-08-14 DIAGNOSIS — Z7901 Long term (current) use of anticoagulants: Secondary | ICD-10-CM | POA: Diagnosis not present

## 2016-08-14 LAB — POCT INR: INR: 3.6

## 2016-08-15 ENCOUNTER — Telehealth: Payer: Self-pay

## 2016-08-15 DIAGNOSIS — K7469 Other cirrhosis of liver: Secondary | ICD-10-CM

## 2016-08-15 NOTE — Telephone Encounter (Signed)
-----   Message from Barron Alvine, RN sent at 02/15/2016  9:38 AM EDT ----- Notes Recorded by Ladene Artist, MD on 02/14/2016 at 7:00 PM EDT No liver lesions Abd [place] in 6 months

## 2016-08-15 NOTE — Telephone Encounter (Signed)
I left a message for the patient to call back to set up [place]

## 2016-08-15 NOTE — Telephone Encounter (Signed)
Patient notified of [place] scheduled at [facility] for 09/10/16 9:00.  She is notified to arrive at 8:45 and be NPO after midnight.

## 2016-08-27 ENCOUNTER — Ambulatory Visit (INDEPENDENT_AMBULATORY_CARE_PROVIDER_SITE_OTHER): Payer: Medicare Other | Admitting: *Deleted

## 2016-08-27 DIAGNOSIS — Z5181 Encounter for therapeutic drug level monitoring: Secondary | ICD-10-CM | POA: Diagnosis not present

## 2016-08-27 DIAGNOSIS — I4891 Unspecified atrial fibrillation: Secondary | ICD-10-CM

## 2016-08-27 DIAGNOSIS — Z7901 Long term (current) use of anticoagulants: Secondary | ICD-10-CM

## 2016-08-27 LAB — POCT INR: INR: 2.8

## 2016-09-10 ENCOUNTER — Ambulatory Visit (HOSPITAL_COMMUNITY)
Admission: RE | Admit: 2016-09-10 | Discharge: 2016-09-10 | Disposition: A | Discharge status: Home / Self Care | Payer: Medicare Other | Source: Ambulatory Visit | Attending: Gastroenterology | Admitting: Gastroenterology

## 2016-09-10 DIAGNOSIS — K7460 Unspecified cirrhosis of liver: Secondary | ICD-10-CM | POA: Diagnosis not present

## 2016-09-10 DIAGNOSIS — K8020 Calculus of gallbladder without cholecystitis without obstruction: Secondary | ICD-10-CM | POA: Diagnosis not present

## 2016-09-10 DIAGNOSIS — K7469 Other cirrhosis of liver: Secondary | ICD-10-CM

## 2016-09-15 ENCOUNTER — Inpatient Hospital Stay (HOSPITAL_COMMUNITY): Admission: RE | Admit: 2016-09-15 | Payer: Medicare Other | Source: Ambulatory Visit | Admitting: Nurse Practitioner

## 2016-09-17 ENCOUNTER — Ambulatory Visit (INDEPENDENT_AMBULATORY_CARE_PROVIDER_SITE_OTHER): Payer: Medicare Other | Admitting: *Deleted

## 2016-09-17 DIAGNOSIS — Z7901 Long term (current) use of anticoagulants: Secondary | ICD-10-CM | POA: Diagnosis not present

## 2016-09-17 DIAGNOSIS — Z5181 Encounter for therapeutic drug level monitoring: Secondary | ICD-10-CM | POA: Diagnosis not present

## 2016-09-17 DIAGNOSIS — I4891 Unspecified atrial fibrillation: Secondary | ICD-10-CM | POA: Diagnosis not present

## 2016-09-17 LAB — POCT INR: INR: 2.3

## 2016-09-22 ENCOUNTER — Ambulatory Visit (HOSPITAL_COMMUNITY)
Admission: RE | Admit: 2016-09-22 | Discharge: 2016-09-22 | Disposition: A | Discharge status: Home / Self Care | Payer: Medicare Other | Source: Ambulatory Visit | Attending: Nurse Practitioner | Admitting: Nurse Practitioner

## 2016-09-22 ENCOUNTER — Encounter (HOSPITAL_COMMUNITY): Payer: Self-pay | Admitting: Nurse Practitioner

## 2016-09-22 VITALS — BP 142/92 | HR 71 | Ht 62.0 in | Wt 171.0 lb

## 2016-09-22 DIAGNOSIS — I480 Paroxysmal atrial fibrillation: Secondary | ICD-10-CM | POA: Insufficient documentation

## 2016-09-22 DIAGNOSIS — Z7901 Long term (current) use of anticoagulants: Secondary | ICD-10-CM | POA: Insufficient documentation

## 2016-09-22 DIAGNOSIS — K449 Diaphragmatic hernia without obstruction or gangrene: Secondary | ICD-10-CM | POA: Diagnosis not present

## 2016-09-22 DIAGNOSIS — E669 Obesity, unspecified: Secondary | ICD-10-CM | POA: Insufficient documentation

## 2016-09-22 DIAGNOSIS — Z823 Family history of stroke: Secondary | ICD-10-CM | POA: Insufficient documentation

## 2016-09-22 DIAGNOSIS — Z79899 Other long term (current) drug therapy: Secondary | ICD-10-CM | POA: Diagnosis not present

## 2016-09-22 DIAGNOSIS — I10 Essential (primary) hypertension: Secondary | ICD-10-CM | POA: Diagnosis not present

## 2016-09-22 DIAGNOSIS — Z794 Long term (current) use of insulin: Secondary | ICD-10-CM | POA: Insufficient documentation

## 2016-09-22 DIAGNOSIS — Z8249 Family history of ischemic heart disease and other diseases of the circulatory system: Secondary | ICD-10-CM | POA: Diagnosis not present

## 2016-09-22 DIAGNOSIS — E119 Type 2 diabetes mellitus without complications: Secondary | ICD-10-CM | POA: Diagnosis not present

## 2016-09-22 DIAGNOSIS — E7800 Pure hypercholesterolemia, unspecified: Secondary | ICD-10-CM | POA: Insufficient documentation

## 2016-09-22 DIAGNOSIS — Z9889 Other specified postprocedural states: Secondary | ICD-10-CM | POA: Insufficient documentation

## 2016-09-22 DIAGNOSIS — K219 Gastro-esophageal reflux disease without esophagitis: Secondary | ICD-10-CM | POA: Diagnosis not present

## 2016-09-22 NOTE — Progress Notes (Signed)
Primary Care Physician: Sara Morning, DO Referring Physician: Dr. Merton Border Hancock is a 82 y.o. female with a h/o afib that is in the afib clinic for 6 month f/u from Dr. Caryl Hancock. She feels well and has not had any occurrences of afib that she is aware.She continues on propafenone 325 mg bid.Continues on warfarin followed at [facility] office.  Today, she denies symptoms of palpitations, chest pain, shortness of breath, orthopnea, PND, lower extremity edema, dizziness, presyncope, syncope, or neurologic sequela. The patient is tolerating medications without difficulties and is otherwise without complaint today.   Past Medical History:  Diagnosis Date  . Anemia   . Arthritis   . Biliary cirrhosis ([place])   . Cholelithiasis 01/09/12  . Diverticulosis of colon with hemorrhage 06/08/2000  . Esophageal stenosis 01/08/11  . Esophagitis 01/08/11  . GERD (gastroesophageal reflux disease)   . Hiatal hernia 01/08/11  . HTN (hypertension)   . Hx of adenomatous colonic polyps 01/08/11  . Hyperplastic colon polyp 06/08/2000, 07/31/2003, 01/08/11  . Obesity   . Osteopenia   . PAF (paroxysmal atrial fibrillation) (HCC)    occurring in the setting of bradycardia and mild first degree AV block followed by Dr. Jens Hancock  . Pure hypercholesterolemia   . Type II or unspecified type diabetes mellitus without mention of complication, not stated as uncontrolled   . Uterine fibroid 01/09/12   Past Surgical History:  Procedure Laterality Date  . BREAST CYST EXCISION     left  . COLONOSCOPY    . INSERTION OF MESH  05/18/2012   Procedure: INSERTION OF MESH;  Surgeon: Sara Hector, MD;  Location: [facility];  Service: General;  Laterality: N/A;  . LIVER BIOPSY  05/18/2012   Procedure: LIVER BIOPSY;  Surgeon: Sara Hector, MD;  Location: [facility];  Service: General;;  . POLYPECTOMY    . THYROIDECTOMY, PARTIAL  1980  . TUBAL LIGATION  1972  . VENTRAL HERNIA REPAIR  05/18/2012   Procedure: LAPAROSCOPIC VENTRAL  HERNIA;  Surgeon: Sara Hector, MD;  Location: [facility];  Service: General;  Laterality: N/A;  Laparoscopic Ventral Wall Hernia with Mesh    Current Outpatient Prescriptions  Medication Sig Dispense Refill  . calcium-vitamin D (OSCAL WITH D) 250-125 MG-UNIT per tablet Take 1 tablet by mouth daily.    . Ferrous Sulfate (IRON) 325 (65 FE) MG TABS Take 325 mg by mouth daily. 30 each 0  . fexofenadine (ALLEGRA) 180 MG tablet Take 180 mg by mouth daily.     . folic acid (FOLVITE) 706 MCG tablet Take 400 mcg by mouth daily.    . furosemide (LASIX) 40 MG tablet Take 60 mg by mouth every Hancock. 1 and 1/2 half 40 mg tablet for a total of 60 mg.    . hydrALAZINE (APRESOLINE) 25 MG tablet Take 25 mg by mouth 2 (two) times daily.    . insulin detemir (LEVEMIR FLEXPEN) 100 UNIT/ML injection Inject 25 Units into the skin at bedtime.     . irbesartan (AVAPRO) 300 MG tablet Take 300 mg by mouth every evening.    Marland Kitchen ketoconazole (NIZORAL) 2 % cream Apply 1 application topically daily as needed. Around skin folds for irritation    . metFORMIN (GLUCOPHAGE) 1000 MG tablet Take 1,000 mg by mouth 2 (two) times daily with a meal.     . metoprolol (LOPRESSOR) 50 MG tablet Take 25-50 mg by mouth 2 (two) times daily. Take 25 mg in the Hancock  and 50 mg at night    . Multiple Vitamin (MULTIVITAMIN WITH MINERALS) TABS Take 1 tablet by mouth daily.    . pantoprazole (PROTONIX) 40 MG tablet TAKE 1 TABLET(40 MG) BY MOUTH DAILY 90 tablet 3  . propafenone (RYTHMOL SR) 325 MG 12 hr capsule TAKE 1 CAPSULE(325 MG) BY MOUTH TWICE DAILY 31 capsule 11  . simvastatin (ZOCOR) 40 MG tablet Take 40 mg by mouth every evening.     . sitaGLIPtin (JANUVIA) 100 MG tablet Take 100 mg by mouth at bedtime.     . ursodiol (ACTIGALL) 300 MG capsule TAKE 1 CAPSULE(300 MG) BY MOUTH TWICE DAILY 180 capsule 3  . vitamin C (ASCORBIC ACID) 500 MG tablet Take 500 mg by mouth daily.    . vitamin E 400 UNIT capsule Take 400 Units by mouth daily.    Marland Kitchen  warfarin (COUMADIN) 5 MG tablet TAKE AS DIRECTED BY COUMADIN CLINIC 90 tablet 1   No current facility-administered medications for this encounter.     Allergies  Allergen Reactions  . Ace Inhibitors Anaphylaxis  . Capoten [Captopril] Anaphylaxis    Throat swells shut  . Neomycin-Bacitracin Zn-Polymyx Rash    Social History   Social History  . Marital status: Married    Spouse name: N/A  . Number of children: 2  . Years of education: N/A   Occupational History  . retired Retired   Social History Main Topics  . Smoking status: Never Smoker  . Smokeless tobacco: Never Used  . Alcohol use No  . Drug use: No  . Sexual activity: Not Currently   Other Topics Concern  . Not on file   Social History Narrative  . No narrative on file    Family History  Problem Relation Age of Onset  . Heart attack Father   . Stroke Mother   . Diabetes Sister        x 2  . Diabetes Brother   . Colon cancer Neg Hx     ROS- All systems are reviewed and negative except as per the HPI above  Physical Exam: Vitals:   09/22/16 1035  BP: (!) 142/92  Pulse: 71  Weight: 171 lb (77.6 kg)  Height: 5\' 2"  (1.575 m)   Wt Readings from Last 3 Encounters:  09/22/16 171 lb (77.6 kg)  03/19/16 176 lb 6.4 oz (80 kg)  02/07/16 176 lb 6 oz (80 kg)    Labs: Lab Results  Component Value Date   NA 138 01/31/2016   K 4.2 01/31/2016   CL 100 01/31/2016   CO2 29 01/31/2016   GLUCOSE 209 (H) 01/31/2016   BUN 21 01/31/2016   CREATININE 0.94 01/31/2016   CALCIUM 9.0 01/31/2016   Lab Results  Component Value Date   INR 2.3 09/17/2016   No results found for: CHOL, HDL, LDLCALC, TRIG   GEN- The patient is well appearing, alert and oriented x 3 today.   Head- normocephalic, atraumatic Eyes-  Sclera clear, conjunctiva pink Ears- hearing intact Oropharynx- clear Neck- supple, no JVP Lymph- no cervical lymphadenopathy Lungs- Clear to ausculation bilaterally, normal work of breathing Heart-  Regular rate and rhythm, no murmurs, rubs or gallops, PMI not laterally displaced GI- soft, NT, ND, + BS Extremities- no clubbing, cyanosis, or edema MS- no significant deformity or atrophy Skin- no rash or lesion Psych- euthymic mood, full affect Neuro- strength and sensation are intact  EKG-NSR at 71 bpm, pr int 186 ms, qrs 83 ms, qtc 443 ms  Epic records reviewed    Assessment and Plan: 1. Paroxysmal afib afib burden is very low On propafenone, continue drug Continue warfarin  F/u with Dr. Caryl Hancock in 6 months  Sara Hancock. Sara Hancock, [facility] [address] [place], [place] [address] [phone]

## 2016-09-25 ENCOUNTER — Telehealth: Payer: Self-pay | Admitting: *Deleted

## 2016-09-25 DIAGNOSIS — M25572 Pain in left ankle and joints of left foot: Secondary | ICD-10-CM | POA: Diagnosis not present

## 2016-09-25 DIAGNOSIS — S99922A Unspecified injury of left foot, initial encounter: Secondary | ICD-10-CM | POA: Diagnosis not present

## 2016-09-25 DIAGNOSIS — M85872 Other specified disorders of bone density and structure, left ankle and foot: Secondary | ICD-10-CM | POA: Diagnosis not present

## 2016-09-25 DIAGNOSIS — M7989 Other specified soft tissue disorders: Secondary | ICD-10-CM | POA: Diagnosis not present

## 2016-09-25 DIAGNOSIS — M79672 Pain in left foot: Secondary | ICD-10-CM | POA: Insufficient documentation

## 2016-09-25 DIAGNOSIS — M79605 Pain in left leg: Secondary | ICD-10-CM | POA: Insufficient documentation

## 2016-09-25 DIAGNOSIS — L03116 Cellulitis of left lower limb: Secondary | ICD-10-CM

## 2016-09-25 DIAGNOSIS — E119 Type 2 diabetes mellitus without complications: Secondary | ICD-10-CM | POA: Diagnosis not present

## 2016-09-25 DIAGNOSIS — I739 Peripheral vascular disease, unspecified: Secondary | ICD-10-CM | POA: Diagnosis not present

## 2016-09-25 HISTORY — DX: Cellulitis of left lower limb: L03.116

## 2016-09-25 HISTORY — DX: Pain in left foot: M79.672

## 2016-09-25 NOTE — Telephone Encounter (Signed)
Pt called to state she is taking Doxycline 100mg  twice a day & will start today for cellulits. Advised pt that the med does interfere with Coumadin & she verbalized understanding. Pt advised to eat green leafy veggies & Appt changed to Monday.

## 2016-09-29 ENCOUNTER — Ambulatory Visit (INDEPENDENT_AMBULATORY_CARE_PROVIDER_SITE_OTHER): Payer: Medicare Other

## 2016-09-29 DIAGNOSIS — L03116 Cellulitis of left lower limb: Secondary | ICD-10-CM | POA: Diagnosis not present

## 2016-09-29 DIAGNOSIS — Z7901 Long term (current) use of anticoagulants: Secondary | ICD-10-CM

## 2016-09-29 DIAGNOSIS — I4891 Unspecified atrial fibrillation: Secondary | ICD-10-CM

## 2016-09-29 DIAGNOSIS — Z5181 Encounter for therapeutic drug level monitoring: Secondary | ICD-10-CM

## 2016-09-29 LAB — POCT INR: INR: 3

## 2016-10-02 DIAGNOSIS — E119 Type 2 diabetes mellitus without complications: Secondary | ICD-10-CM | POA: Diagnosis not present

## 2016-10-02 DIAGNOSIS — L03116 Cellulitis of left lower limb: Secondary | ICD-10-CM | POA: Diagnosis not present

## 2016-10-16 ENCOUNTER — Ambulatory Visit (INDEPENDENT_AMBULATORY_CARE_PROVIDER_SITE_OTHER): Payer: Medicare Other | Admitting: *Deleted

## 2016-10-16 DIAGNOSIS — Z7901 Long term (current) use of anticoagulants: Secondary | ICD-10-CM | POA: Diagnosis not present

## 2016-10-16 DIAGNOSIS — I4891 Unspecified atrial fibrillation: Secondary | ICD-10-CM | POA: Diagnosis not present

## 2016-10-16 DIAGNOSIS — Z5181 Encounter for therapeutic drug level monitoring: Secondary | ICD-10-CM

## 2016-10-16 LAB — POCT INR: INR: 2.6

## 2016-10-30 DIAGNOSIS — E119 Type 2 diabetes mellitus without complications: Secondary | ICD-10-CM | POA: Diagnosis not present

## 2016-11-13 ENCOUNTER — Ambulatory Visit (INDEPENDENT_AMBULATORY_CARE_PROVIDER_SITE_OTHER): Payer: Medicare Other | Admitting: *Deleted

## 2016-11-13 DIAGNOSIS — I480 Paroxysmal atrial fibrillation: Secondary | ICD-10-CM | POA: Diagnosis not present

## 2016-11-13 DIAGNOSIS — Z5181 Encounter for therapeutic drug level monitoring: Secondary | ICD-10-CM

## 2016-11-13 LAB — POCT INR: INR: 3.2

## 2016-11-27 ENCOUNTER — Ambulatory Visit (INDEPENDENT_AMBULATORY_CARE_PROVIDER_SITE_OTHER): Payer: Medicare Other | Admitting: Pharmacist

## 2016-11-27 DIAGNOSIS — Z5181 Encounter for therapeutic drug level monitoring: Secondary | ICD-10-CM | POA: Diagnosis not present

## 2016-11-27 DIAGNOSIS — I480 Paroxysmal atrial fibrillation: Secondary | ICD-10-CM | POA: Diagnosis not present

## 2016-11-27 LAB — POCT INR: INR: 2.8

## 2016-12-18 ENCOUNTER — Ambulatory Visit (INDEPENDENT_AMBULATORY_CARE_PROVIDER_SITE_OTHER): Payer: Medicare Other

## 2016-12-18 DIAGNOSIS — I4891 Unspecified atrial fibrillation: Secondary | ICD-10-CM | POA: Diagnosis not present

## 2016-12-18 DIAGNOSIS — Z5181 Encounter for therapeutic drug level monitoring: Secondary | ICD-10-CM | POA: Diagnosis not present

## 2016-12-18 DIAGNOSIS — I480 Paroxysmal atrial fibrillation: Secondary | ICD-10-CM

## 2016-12-18 LAB — POCT INR: INR: 2.4

## 2016-12-26 ENCOUNTER — Other Ambulatory Visit: Payer: Self-pay | Admitting: Family Medicine

## 2016-12-26 DIAGNOSIS — Z1231 Encounter for screening mammogram for malignant neoplasm of breast: Secondary | ICD-10-CM

## 2016-12-30 DIAGNOSIS — L219 Seborrheic dermatitis, unspecified: Secondary | ICD-10-CM | POA: Diagnosis not present

## 2017-01-02 ENCOUNTER — Telehealth: Payer: Self-pay | Admitting: Gastroenterology

## 2017-01-02 DIAGNOSIS — K743 Primary biliary cirrhosis: Secondary | ICD-10-CM

## 2017-01-02 NOTE — Telephone Encounter (Signed)
Dr. Fuller Plan would you like labs prior to her appt?

## 2017-01-05 NOTE — Telephone Encounter (Signed)
I left a detailed message for the patient to come for labs at her convenience prior to the appt

## 2017-01-05 NOTE — Telephone Encounter (Signed)
Yes CMP, CBC, TSH, PT/INR

## 2017-01-15 ENCOUNTER — Ambulatory Visit (INDEPENDENT_AMBULATORY_CARE_PROVIDER_SITE_OTHER): Payer: Medicare Other | Admitting: *Deleted

## 2017-01-15 DIAGNOSIS — Z5181 Encounter for therapeutic drug level monitoring: Secondary | ICD-10-CM

## 2017-01-15 DIAGNOSIS — I480 Paroxysmal atrial fibrillation: Secondary | ICD-10-CM

## 2017-01-15 DIAGNOSIS — I4891 Unspecified atrial fibrillation: Secondary | ICD-10-CM

## 2017-01-15 LAB — POCT INR: INR: 2.7

## 2017-01-18 ENCOUNTER — Other Ambulatory Visit: Payer: Self-pay | Admitting: Internal Medicine

## 2017-01-18 ENCOUNTER — Other Ambulatory Visit: Payer: Self-pay | Admitting: Gastroenterology

## 2017-01-20 ENCOUNTER — Telehealth: Payer: Self-pay | Admitting: Gastroenterology

## 2017-01-20 NOTE — Telephone Encounter (Signed)
Patient states the pharmacy told her we only gave partial prescription for Ursodiol. Informed patient to call pharmacy and discuss why they gave her partial prescription because our office dispensed #[id number] on 01/19/17. Patient verbalized understanding and will contact [facility].

## 2017-02-12 ENCOUNTER — Ambulatory Visit
Admission: RE | Admit: 2017-02-12 | Discharge: 2017-02-12 | Disposition: A | Discharge status: Home / Self Care | Payer: Medicare Other | Source: Ambulatory Visit | Attending: Family Medicine | Admitting: Family Medicine

## 2017-02-12 ENCOUNTER — Ambulatory Visit (INDEPENDENT_AMBULATORY_CARE_PROVIDER_SITE_OTHER): Payer: Medicare Other | Admitting: *Deleted

## 2017-02-12 DIAGNOSIS — I4891 Unspecified atrial fibrillation: Secondary | ICD-10-CM | POA: Diagnosis not present

## 2017-02-12 DIAGNOSIS — I480 Paroxysmal atrial fibrillation: Secondary | ICD-10-CM

## 2017-02-12 DIAGNOSIS — Z5181 Encounter for therapeutic drug level monitoring: Secondary | ICD-10-CM | POA: Diagnosis not present

## 2017-02-12 DIAGNOSIS — Z1231 Encounter for screening mammogram for malignant neoplasm of breast: Secondary | ICD-10-CM | POA: Diagnosis not present

## 2017-02-12 LAB — POCT INR: INR: 2

## 2017-02-17 DIAGNOSIS — R350 Frequency of micturition: Secondary | ICD-10-CM | POA: Diagnosis not present

## 2017-02-17 DIAGNOSIS — I10 Essential (primary) hypertension: Secondary | ICD-10-CM | POA: Diagnosis not present

## 2017-02-17 DIAGNOSIS — Z23 Encounter for immunization: Secondary | ICD-10-CM | POA: Diagnosis not present

## 2017-02-17 DIAGNOSIS — N958 Other specified menopausal and perimenopausal disorders: Secondary | ICD-10-CM | POA: Diagnosis not present

## 2017-02-17 DIAGNOSIS — E782 Mixed hyperlipidemia: Secondary | ICD-10-CM | POA: Diagnosis not present

## 2017-02-17 DIAGNOSIS — E119 Type 2 diabetes mellitus without complications: Secondary | ICD-10-CM | POA: Diagnosis not present

## 2017-02-17 DIAGNOSIS — I480 Paroxysmal atrial fibrillation: Secondary | ICD-10-CM | POA: Diagnosis not present

## 2017-02-18 DIAGNOSIS — I10 Essential (primary) hypertension: Secondary | ICD-10-CM | POA: Diagnosis not present

## 2017-02-18 DIAGNOSIS — E782 Mixed hyperlipidemia: Secondary | ICD-10-CM | POA: Diagnosis not present

## 2017-02-18 DIAGNOSIS — E1165 Type 2 diabetes mellitus with hyperglycemia: Secondary | ICD-10-CM | POA: Diagnosis not present

## 2017-02-26 ENCOUNTER — Other Ambulatory Visit (INDEPENDENT_AMBULATORY_CARE_PROVIDER_SITE_OTHER): Payer: Medicare Other

## 2017-02-26 DIAGNOSIS — H40003 Preglaucoma, unspecified, bilateral: Secondary | ICD-10-CM | POA: Insufficient documentation

## 2017-02-26 DIAGNOSIS — H25813 Combined forms of age-related cataract, bilateral: Secondary | ICD-10-CM | POA: Insufficient documentation

## 2017-02-26 DIAGNOSIS — K743 Primary biliary cirrhosis: Secondary | ICD-10-CM | POA: Diagnosis not present

## 2017-02-26 DIAGNOSIS — H43813 Vitreous degeneration, bilateral: Secondary | ICD-10-CM | POA: Diagnosis not present

## 2017-02-26 DIAGNOSIS — H33311 Horseshoe tear of retina without detachment, right eye: Secondary | ICD-10-CM | POA: Diagnosis not present

## 2017-02-26 DIAGNOSIS — E119 Type 2 diabetes mellitus without complications: Secondary | ICD-10-CM | POA: Diagnosis not present

## 2017-02-26 HISTORY — DX: Preglaucoma, unspecified, bilateral: H40.003

## 2017-02-26 HISTORY — DX: Combined forms of age-related cataract, bilateral: H25.813

## 2017-02-26 LAB — COMPREHENSIVE METABOLIC PANEL
ALT: 21 U/L (ref 0–35)
AST: 22 U/L (ref 0–37)
Albumin: 3.6 g/dL (ref 3.5–5.2)
Alkaline Phosphatase: 133 U/L — ABNORMAL HIGH (ref 39–117)
BUN: 24 mg/dL — ABNORMAL HIGH (ref 6–23)
CO2: 29 mEq/L (ref 19–32)
Calcium: 9.2 mg/dL (ref 8.4–10.5)
Chloride: 102 mEq/L (ref 96–112)
Creatinine, Ser: 0.96 mg/dL (ref 0.40–1.20)
GFR: 59.4 mL/min — ABNORMAL LOW (ref >60.00)
Glucose, Bld: 243 mg/dL — ABNORMAL HIGH (ref 70–99)
Potassium: 4.5 mEq/L (ref 3.5–5.1)
Sodium: 138 mEq/L (ref 135–145)
Total Bilirubin: 0.4 mg/dL (ref 0.2–1.2)
Total Protein: 7.2 g/dL (ref 6.0–8.3)

## 2017-02-26 LAB — TSH: TSH: 1.67 u[IU]/mL (ref 0.35–4.50)

## 2017-02-26 LAB — CBC WITH DIFFERENTIAL/PLATELET
Basophils Absolute: 0 10*3/uL (ref 0.0–0.1)
Basophils Relative: 0.3 % (ref 0.0–3.0)
Eosinophils Absolute: 0 10*3/uL (ref 0.0–0.7)
Eosinophils Relative: 0.9 % (ref 0.0–5.0)
HCT: 31.7 % — ABNORMAL LOW (ref 36.0–46.0)
Hemoglobin: 10.4 g/dL — ABNORMAL LOW (ref 12.0–15.0)
Lymphocytes Relative: 26.1 % (ref 12.0–46.0)
Lymphs Abs: 1.4 10*3/uL (ref 0.7–4.0)
MCHC: 32.9 g/dL (ref 30.0–36.0)
MCV: 90.7 fl (ref 78.0–100.0)
Monocytes Absolute: 0.5 10*3/uL (ref 0.1–1.0)
Monocytes Relative: 8.4 % (ref 3.0–12.0)
Neutro Abs: 3.4 10*3/uL (ref 1.4–7.7)
Neutrophils Relative %: 64.3 % (ref 43.0–77.0)
Platelets: 232 10*3/uL (ref 150.0–400.0)
RBC: 3.49 Mil/uL — ABNORMAL LOW (ref 3.87–5.11)
RDW: 15.8 % — ABNORMAL HIGH (ref 11.5–15.5)
WBC: 5.4 10*3/uL (ref 4.0–10.5)

## 2017-02-26 LAB — PROTIME-INR
INR: 2.6 ratio — ABNORMAL HIGH (ref 0.8–1.0)
Prothrombin Time: 27.7 s — ABNORMAL HIGH (ref 9.6–13.1)

## 2017-03-05 ENCOUNTER — Ambulatory Visit (INDEPENDENT_AMBULATORY_CARE_PROVIDER_SITE_OTHER): Payer: Medicare Other | Admitting: Gastroenterology

## 2017-03-05 ENCOUNTER — Encounter: Payer: Self-pay | Admitting: Gastroenterology

## 2017-03-05 VITALS — BP 160/84 | HR 80 | Ht 62.0 in | Wt 178.0 lb

## 2017-03-05 DIAGNOSIS — K743 Primary biliary cirrhosis: Secondary | ICD-10-CM

## 2017-03-05 DIAGNOSIS — K7581 Nonalcoholic steatohepatitis (NASH): Secondary | ICD-10-CM

## 2017-03-05 DIAGNOSIS — K219 Gastro-esophageal reflux disease without esophagitis: Secondary | ICD-10-CM

## 2017-03-05 MED ORDER — PANTOPRAZOLE SODIUM 40 MG PO TBEC: DELAYED_RELEASE_TABLET | ORAL | 3 refills | Status: DC

## 2017-03-05 MED ORDER — URSODIOL 300 MG PO CAPS: 300.0000 mg | ORAL_CAPSULE | Freq: Two times a day (BID) | ORAL | 3 refills | Status: DC

## 2017-03-05 NOTE — Patient Instructions (Signed)
We have sent the following medications to your pharmacy for you to pick up at your convenience: Actigall and Protonix.   Thank you for choosing me and [facility].  Pricilla Riffle. Dagoberto Ligas., MD., Marval Regal

## 2017-03-05 NOTE — Progress Notes (Signed)
    History of Present Illness: This is an 82 year old female with PBC and steatohepatitis. Blood work and recent US reviewed with patient. Alk phos=133, other LFTs normal, Hb=10.4.  She has no gastrointestinal complaints.  Her diabetes is under good control.  Current Medications, Allergies, Past Medical History, Past Surgical History, Family History and Social History were reviewed in [employer] record.  Physical Exam: General: Well developed, well nourished, no acute distress Head: Normocephalic and atraumatic Eyes:  sclerae anicteric, EOMI Ears: Normal auditory acuity Mouth: No deformity or lesions Lungs: Clear throughout to auscultation Heart: Regular rate and rhythm; no murmurs, rubs or bruits Abdomen: Soft, non tender and non distended. No masses, hepatosplenomegaly or hernias noted. Normal Bowel sounds Musculoskeletal: Symmetrical with no gross deformities  Pulses:  Normal pulses noted Extremities: No clubbing, cyanosis, edema or deformities noted Neurological: Alert oriented x 4, grossly nonfocal Psychological:  Alert and cooperative. Normal mood and affect  Assessment and Recommendations:  1. PBC and steatohepatitis. Continue Actigall 300 mg po bid. RUQ [place] and blood work in 6 months.   2. History of GU, erosive gastritis, GERD. Continue Protonix 40 mg daily.  3. DM. HbA1C=7.6.

## 2017-03-17 ENCOUNTER — Telehealth: Payer: Self-pay

## 2017-03-17 ENCOUNTER — Other Ambulatory Visit: Payer: Self-pay

## 2017-03-17 DIAGNOSIS — K743 Primary biliary cirrhosis: Secondary | ICD-10-CM

## 2017-03-17 DIAGNOSIS — K7469 Other cirrhosis of liver: Secondary | ICD-10-CM

## 2017-03-17 NOTE — Telephone Encounter (Signed)
-----   Message from Jeoffrey Massed, RN sent at 03/13/2017  2:09 PM EDT -----   ----- Message ----- From: Marlon Pel, RN Sent: 03/13/2017 To: Marlon Pel, RN  Needs [place]- see [place] results on 09/10/16- Fuller Plan

## 2017-03-17 NOTE — Telephone Encounter (Signed)
Tried twice to reach patient by phone, sent letter with scheduled date/time of 6 month follow up appointment.

## 2017-03-17 NOTE — Telephone Encounter (Signed)
Left message for patient to call back, I have scheduled her for the 6 month follow up US. At [facility] on 03/24/17 arrive at 9:45 for 10:00, NPO 6 hours prior.

## 2017-03-20 ENCOUNTER — Telehealth: Payer: Self-pay

## 2017-03-20 NOTE — Telephone Encounter (Signed)
Patient returning the call for the u/s scheduled on 03/24/17. She requests the phone number to reschedule the appointment herself. She declines help in changing the date. Information supplied to the patient.

## 2017-03-23 ENCOUNTER — Telehealth: Payer: Self-pay | Admitting: Gastroenterology

## 2017-03-23 NOTE — Telephone Encounter (Signed)
patient came in wanting records from last labs on 10.18.18. Nurse printed out for patient. No additional notes needed

## 2017-03-24 ENCOUNTER — Ambulatory Visit (HOSPITAL_COMMUNITY): Payer: Medicare Other

## 2017-03-30 ENCOUNTER — Ambulatory Visit (INDEPENDENT_AMBULATORY_CARE_PROVIDER_SITE_OTHER): Payer: Medicare Other | Admitting: Internal Medicine

## 2017-03-30 ENCOUNTER — Encounter: Payer: Self-pay | Admitting: Internal Medicine

## 2017-03-30 ENCOUNTER — Ambulatory Visit (INDEPENDENT_AMBULATORY_CARE_PROVIDER_SITE_OTHER): Payer: Medicare Other | Admitting: *Deleted

## 2017-03-30 VITALS — BP 172/96 | HR 75 | Ht 62.0 in | Wt 181.0 lb

## 2017-03-30 DIAGNOSIS — I480 Paroxysmal atrial fibrillation: Secondary | ICD-10-CM

## 2017-03-30 DIAGNOSIS — Z5181 Encounter for therapeutic drug level monitoring: Secondary | ICD-10-CM

## 2017-03-30 LAB — POCT INR: INR: 2.9

## 2017-03-30 MED ORDER — HYDRALAZINE HCL 50 MG PO TABS: 50.0000 mg | ORAL_TABLET | Freq: Two times a day (BID) | ORAL | 1 refills | Status: DC

## 2017-03-30 NOTE — Progress Notes (Signed)
Patient Care Team: Rochel Brome, MD as PCP - General (Family Medicine) [name], MD as PCP - Cardiology (Cardiology) [name], MD as Consulting Physician (Gastroenterology) [name], MD as Consulting Physician (Cardiology)   HPI  Sara Hancock is a 82 y.o. female seen in followup for atrial fibrillation that is paroxysmal. It occurs in the setting of background bradycardia and mild first-degree AV block. She is doing quite well without recurrent tachypalpitations.   She is tolerating [name] without complaint and this had no interval atrial fibrillation of which she is aware.  She has however noticed some slow heartbeats. These are associated with a sensation in her chest. She takes her pulse and notes that it slow and somewhat irregular  Without chest pain or shortness of breath; she has had more problems of late with swelling.  She denies changes in her diet.  She is copious in her fluid intake.   DATE PR interval QRSduration Dose  9/15 200 98 325  11/17 200 96 325  11/18 244 104 325        Date Hgb  10/18 10.4         Past Medical History:  Diagnosis Date  . Anemia   . Arthritis   . Biliary cirrhosis ([place])   . Cholelithiasis 01/09/12  . Diverticulosis of colon with hemorrhage 06/08/2000  . Esophageal stenosis 01/08/11  . Esophagitis 01/08/11  . GERD (gastroesophageal reflux disease)   . Hiatal hernia 01/08/11  . HTN (hypertension)   . Hx of adenomatous colonic polyps 01/08/11  . Hyperplastic colon polyp 06/08/2000, 07/31/2003, 01/08/11  . Obesity   . Osteopenia   . PAF (paroxysmal atrial fibrillation) (HCC)    occurring in the setting of bradycardia and mild first degree AV block followed by Dr. Jens Som  . Pure hypercholesterolemia   . Type II or unspecified type diabetes mellitus without mention of complication, not stated as uncontrolled   . Uterine fibroid 01/09/12    Past Surgical History:  Procedure Laterality Date  . BREAST CYST  EXCISION     left  . COLONOSCOPY    . INSERTION OF MESH N/A 05/18/2012   Performed by Adin Hector., MD at [facility] ORS  . LAPAROSCOPIC VENTRAL HERNIA N/A 05/18/2012   Performed by Adin Hector., MD at [facility] ORS  . LIVER BIOPSY  05/18/2012   Performed by Adin Hector., MD at [facility] ORS  . POLYPECTOMY    . THYROIDECTOMY, PARTIAL  1980  . TUBAL LIGATION  1972    Current Outpatient Medications  Medication Sig Dispense Refill  . calcium-vitamin D (OSCAL WITH D) 250-125 MG-UNIT per tablet Take 1 tablet by mouth daily.    . Ferrous Sulfate (IRON) 325 (65 FE) MG TABS Take 325 mg by mouth daily. 30 each 0  . fexofenadine (ALLEGRA) 180 MG tablet Take 180 mg by mouth daily.     . folic acid (FOLVITE) 841 MCG tablet Take 400 mcg by mouth daily.    . furosemide (LASIX) 40 MG tablet Take 60 mg by mouth every morning. 1 and 1/2 half 40 mg tablet for a total of 60 mg.    . hydrALAZINE (APRESOLINE) 25 MG tablet Take 25 mg by mouth 2 (two) times daily.    . insulin detemir (LEVEMIR FLEXPEN) 100 UNIT/ML injection Inject 25 Units into the skin as directed. 18 units at bedtime and 32 units in the a.m.    . irbesartan (AVAPRO) 300 MG tablet Take  300 mg by mouth every evening.    Marland Kitchen ketoconazole (NIZORAL) 2 % cream Apply 1 application topically daily as needed. Around skin folds for irritation    . metFORMIN (GLUCOPHAGE) 1000 MG tablet Take 1,000 mg by mouth 2 (two) times daily with a meal.     . metoprolol (LOPRESSOR) 50 MG tablet Take 25-50 mg by mouth 2 (two) times daily. Take 25 mg in the morning and 50 mg at night    . Multiple Vitamin (MULTIVITAMIN WITH MINERALS) TABS Take 1 tablet by mouth daily.    . pantoprazole (PROTONIX) 40 MG tablet TAKE 1 TABLET(40 MG) BY MOUTH DAILY 90 tablet 3  . propafenone (RYTHMOL SR) 325 MG 12 hr capsule TAKE 1 CAPSULE(325 MG) BY MOUTH TWICE DAILY 31 capsule 11  . simvastatin (ZOCOR) 40 MG tablet Take 40 mg by mouth every evening.     . sitaGLIPtin (JANUVIA) 100 MG tablet Take 100  mg by mouth at bedtime.     . ursodiol (ACTIGALL) 300 MG capsule Take 1 capsule (300 mg total) by mouth 2 (two) times daily. 180 capsule 3  . vitamin C (ASCORBIC ACID) 500 MG tablet Take 500 mg by mouth daily.    . vitamin E 400 UNIT capsule Take 400 Units by mouth daily.    Marland Kitchen warfarin (COUMADIN) 5 MG tablet TAKE AS DIRECTED BY COUMADIN CLINIC 90 tablet 0   No current facility-administered medications for this visit.     Allergies  Allergen Reactions  . Ace Inhibitors Anaphylaxis  . Capoten [Captopril] Anaphylaxis    Throat swells shut  . Neomycin-Bacitracin Zn-Polymyx Rash    Review of Systems negative except from HPI and PMH  Physical Exam BP (!) 172/96   Pulse 75   Ht 5\' 2"  (1.575 m)   Wt 181 lb (82.1 kg)   SpO2 90%   BMI 33.11 kg/m  Well developed and well nourished in no acute distress HENT normal E scleral and icterus clear Neck Supple Of a Clear to ausculation  Regular rate and rhythm, no murmurs gallops or rub Soft with active bowel sounds No clubbing cyanosis 1+Edema Alert and oriented, grossly normal motor and sensory function Skin Warm and Dry  ECG demonstrates sinus rhythm at 75 2410/38    Assessment and  Plan  Atrial fibrillation  Hypertension  Bradypalpitations   Abnormal ECG  Edema  Anemia- chronic  The patient had a trivial atrial fibrillation we will continue propafenone Unfortunately, electrocardiographic parameters suggest inordinate with conduction slowing; we will plan to see her in 3 months again to review PR/QRS intervals in the event that they have not returned towards normal we will have to reduce her dose to 225  Blood pressure is quite elevated today.  On repeat was 165.  We will increase her hydralazine from 25--50 twice daily  Anemia is chronic on her warfarin.  She is followed by GI.  Her edema is intermittent.  She is quite exuberant and her fluid intake; I encouraged her to decrease p.o. fluid intake.  She can also  increase her diuretics as needed.

## 2017-03-30 NOTE — Patient Instructions (Signed)
Medication Instructions: Your physician has recommended you make the following change in your medication:  -1) INCREASE HYDRALAZINE 50 MG - Take 1 tablet (50 mg) by mouth twice daily    Labwork: None Ordered  Procedures/Testing: None Ordered  Follow-Up: Your physician recommends that you schedule a follow-up appointment in: 3-4 months with Marinus Maw, PA-C   If you need a refill on your cardiac medications before your next appointment, please call your pharmacy.

## 2017-03-30 NOTE — Patient Instructions (Signed)
Continue taking Coumadin 1 tablet daily except 1/2 tablet on Mondays, Wednesdays, and Fridays.  Recheck in 6 weeks. Continue eating 3 servings of greens a week. Call with new medications or any procedures  [phone]

## 2017-04-13 ENCOUNTER — Other Ambulatory Visit: Payer: Self-pay | Admitting: Internal Medicine

## 2017-04-16 ENCOUNTER — Ambulatory Visit (HOSPITAL_COMMUNITY): Payer: Medicare Other

## 2017-04-17 ENCOUNTER — Ambulatory Visit (HOSPITAL_COMMUNITY): Payer: Medicare Other

## 2017-04-23 ENCOUNTER — Ambulatory Visit (HOSPITAL_COMMUNITY): Payer: Medicare Other

## 2017-05-14 ENCOUNTER — Ambulatory Visit (INDEPENDENT_AMBULATORY_CARE_PROVIDER_SITE_OTHER): Payer: Medicare Other | Admitting: *Deleted

## 2017-05-14 DIAGNOSIS — Z5181 Encounter for therapeutic drug level monitoring: Secondary | ICD-10-CM | POA: Diagnosis not present

## 2017-05-14 DIAGNOSIS — I480 Paroxysmal atrial fibrillation: Secondary | ICD-10-CM

## 2017-05-14 LAB — POCT INR: INR: 2.4

## 2017-05-14 NOTE — Patient Instructions (Signed)
Description   Continue taking Coumadin 1 tablet daily except 1/2 tablet on Mondays, Wednesdays, and Fridays.  Recheck in 6 weeks. Continue eating 3 servings of greens a week. Call with new medications or any procedures  [phone]

## 2017-05-18 DIAGNOSIS — M810 Age-related osteoporosis without current pathological fracture: Secondary | ICD-10-CM | POA: Diagnosis not present

## 2017-05-18 DIAGNOSIS — N958 Other specified menopausal and perimenopausal disorders: Secondary | ICD-10-CM | POA: Diagnosis not present

## 2017-05-22 ENCOUNTER — Other Ambulatory Visit: Payer: Self-pay | Admitting: Internal Medicine

## 2017-05-22 DIAGNOSIS — D649 Anemia, unspecified: Secondary | ICD-10-CM | POA: Diagnosis not present

## 2017-05-22 DIAGNOSIS — E782 Mixed hyperlipidemia: Secondary | ICD-10-CM | POA: Diagnosis not present

## 2017-05-22 DIAGNOSIS — E119 Type 2 diabetes mellitus without complications: Secondary | ICD-10-CM | POA: Diagnosis not present

## 2017-05-22 DIAGNOSIS — I10 Essential (primary) hypertension: Secondary | ICD-10-CM | POA: Diagnosis not present

## 2017-05-22 MED ORDER — PROPAFENONE HCL ER 325 MG PO CP12: ORAL_CAPSULE | ORAL | 3 refills | Status: DC

## 2017-05-25 ENCOUNTER — Ambulatory Visit (HOSPITAL_COMMUNITY): Payer: Medicare Other

## 2017-05-28 DIAGNOSIS — I480 Paroxysmal atrial fibrillation: Secondary | ICD-10-CM | POA: Diagnosis not present

## 2017-05-28 DIAGNOSIS — Z6833 Body mass index (BMI) 33.0-33.9, adult: Secondary | ICD-10-CM | POA: Diagnosis not present

## 2017-05-28 DIAGNOSIS — E782 Mixed hyperlipidemia: Secondary | ICD-10-CM | POA: Diagnosis not present

## 2017-05-28 DIAGNOSIS — E668 Other obesity: Secondary | ICD-10-CM | POA: Diagnosis not present

## 2017-05-28 DIAGNOSIS — K743 Primary biliary cirrhosis: Secondary | ICD-10-CM | POA: Diagnosis not present

## 2017-05-28 DIAGNOSIS — I10 Essential (primary) hypertension: Secondary | ICD-10-CM | POA: Diagnosis not present

## 2017-05-28 DIAGNOSIS — E119 Type 2 diabetes mellitus without complications: Secondary | ICD-10-CM | POA: Diagnosis not present

## 2017-05-29 ENCOUNTER — Ambulatory Visit (HOSPITAL_COMMUNITY)
Admission: RE | Admit: 2017-05-29 | Discharge: 2017-05-29 | Disposition: A | Discharge status: Home / Self Care | Payer: Medicare Other | Source: Ambulatory Visit | Attending: Gastroenterology | Admitting: Gastroenterology

## 2017-05-29 DIAGNOSIS — K8020 Calculus of gallbladder without cholecystitis without obstruction: Secondary | ICD-10-CM | POA: Diagnosis not present

## 2017-05-29 DIAGNOSIS — K743 Primary biliary cirrhosis: Secondary | ICD-10-CM | POA: Diagnosis not present

## 2017-06-03 ENCOUNTER — Telehealth: Payer: Self-pay | Admitting: Gastroenterology

## 2017-06-03 NOTE — Telephone Encounter (Signed)
See results on [place] for additional details.

## 2017-06-25 ENCOUNTER — Ambulatory Visit (INDEPENDENT_AMBULATORY_CARE_PROVIDER_SITE_OTHER): Payer: Medicare Other | Admitting: *Deleted

## 2017-06-25 DIAGNOSIS — Z5181 Encounter for therapeutic drug level monitoring: Secondary | ICD-10-CM | POA: Diagnosis not present

## 2017-06-25 DIAGNOSIS — I480 Paroxysmal atrial fibrillation: Secondary | ICD-10-CM | POA: Diagnosis not present

## 2017-06-25 LAB — POCT INR: INR: 2.9

## 2017-06-25 NOTE — Patient Instructions (Signed)
Description   Continue taking Coumadin 1 tablet daily except 1/2 tablet on Mondays, Wednesdays, and Fridays.  Recheck in 6 weeks. Continue eating 3 servings of greens a week. Call with new medications or any procedures  [phone]

## 2017-07-06 NOTE — Progress Notes (Addendum)
Cardiology Office Note Date:  07/09/2017  Patient ID:  Sara Hancock, DOB 1935-05-07, MRN [id number] PCP:  Rochel Brome, MD  Cardiologist:  Dr. Caryl Comes    Chief Complaint: f/u propafenone dosing  History of Present Illness: NYA MONDS is a 83 y.o. female with history of arthritis, esophageal stenosis, hiatal hernia, GERD, HTN, HLD, DM, obesity, and PAFib, chronic anemia  She comes in ytoday to be seen for Dr. Caryl Comes, last seen by hhim in Nov 2018.  At that tie reported she was doing well without symptoms or AF, tolerating Rythmol with some bradycardia, mild 1st degree AVblock. DATE PR interval QRSduration Dose  9/15 200 98 325  11/17 200 96 325  11/18 244 104 325  07/09/17 204           100         325   Noted intervals prolonged, with plans to re-evaluate in 3 mo, if not recovered plans to reduce her dose to 225mg , her BP was high and hydralazine was increased.  She is feeling quite well.  Once sionce her last visit she felt her HR a little fast, lasted about 30 minutes without associated symptoms.  She denies any kind of CP or SOB, no DOE, no symptoms of PND or orthopnea, no dizziness, near syncope or syncope.  She keeps busy with housework and errands.  Does stretching exercises.  She follows with coumadin clinic, no bleeding or signs of bleeding.  Past Medical History:  Diagnosis Date  . Anemia   . Arthritis   . Biliary cirrhosis ([place])   . Cholelithiasis 01/09/12  . Diverticulosis of colon with hemorrhage 06/08/2000  . Esophageal stenosis 01/08/11  . Esophagitis 01/08/11  . GERD (gastroesophageal reflux disease)   . Hiatal hernia 01/08/11  . HTN (hypertension)   . Hx of adenomatous colonic polyps 01/08/11  . Hyperplastic colon polyp 06/08/2000, 07/31/2003, 01/08/11  . Obesity   . Osteopenia   . PAF (paroxysmal atrial fibrillation) (HCC)    occurring in the setting of bradycardia and mild first degree AV block followed by Dr. Jens Som  . Pure hypercholesterolemia   .  Type II or unspecified type diabetes mellitus without mention of complication, not stated as uncontrolled   . Uterine fibroid 01/09/12    Past Surgical History:  Procedure Laterality Date  . BREAST CYST EXCISION     left  . COLONOSCOPY    . INSERTION OF MESH  05/18/2012   Procedure: INSERTION OF MESH;  Surgeon: Adin Hector, MD;  Location: [facility] ORS;  Service: General;  Laterality: N/A;  . LIVER BIOPSY  05/18/2012   Procedure: LIVER BIOPSY;  Surgeon: Adin Hector, MD;  Location: [facility] ORS;  Service: General;;  . POLYPECTOMY    . THYROIDECTOMY, PARTIAL  1980  . TUBAL LIGATION  1972  . VENTRAL HERNIA REPAIR  05/18/2012   Procedure: LAPAROSCOPIC VENTRAL HERNIA;  Surgeon: Adin Hector, MD;  Location: [facility] ORS;  Service: General;  Laterality: N/A;  Laparoscopic Ventral Wall Hernia with Mesh    Current Outpatient Medications  Medication Sig Dispense Refill  . calcium-vitamin D (OSCAL WITH D) 250-125 MG-UNIT per tablet Take 1 tablet by mouth daily.    . Ferrous Sulfate (IRON) 325 (65 FE) MG TABS Take 325 mg by mouth daily. 30 each 0  . fexofenadine (ALLEGRA) 180 MG tablet Take 180 mg by mouth daily.     . folic acid (FOLVITE) 720 MCG tablet Take 400 mcg by mouth daily.    Marland Kitchen  furosemide (LASIX) 40 MG tablet Take 60 mg by mouth every morning. 1 and 1/2 half 40 mg tablet for a total of 60 mg.    . hydrALAZINE (APRESOLINE) 50 MG tablet Take 1 tablet (50 mg total) 2 (two) times daily by mouth. 180 tablet 1  . insulin detemir (LEVEMIR FLEXPEN) 100 UNIT/ML injection Inject 25 Units into the skin as directed. 18 units at bedtime and 32 units in the a.m.    . irbesartan (AVAPRO) 300 MG tablet Take 300 mg by mouth every evening.    Marland Kitchen ketoconazole (NIZORAL) 2 % cream Apply 1 application topically daily as needed. Around skin folds for irritation    . metFORMIN (GLUCOPHAGE) 1000 MG tablet Take 1,000 mg by mouth 2 (two) times daily with a meal.     . metoprolol (LOPRESSOR) 50 MG tablet Take 25-50 mg by mouth 2  (two) times daily. Take 25 mg in the morning and 50 mg at night    . Multiple Vitamin (MULTIVITAMIN WITH MINERALS) TABS Take 1 tablet by mouth daily.    . pantoprazole (PROTONIX) 40 MG tablet TAKE 1 TABLET(40 MG) BY MOUTH DAILY 90 tablet 3  . propafenone (RYTHMOL SR) 325 MG 12 hr capsule TAKE 1 CAPSULE(325 MG) BY MOUTH TWICE DAILY 180 capsule 3  . simvastatin (ZOCOR) 40 MG tablet Take 40 mg by mouth every evening.     . sitaGLIPtin (JANUVIA) 100 MG tablet Take 100 mg by mouth at bedtime.     . ursodiol (ACTIGALL) 300 MG capsule Take 1 capsule (300 mg total) by mouth 2 (two) times daily. 180 capsule 3  . vitamin C (ASCORBIC ACID) 500 MG tablet Take 500 mg by mouth daily.    . vitamin E 400 UNIT capsule Take 400 Units by mouth daily.    Marland Kitchen warfarin (COUMADIN) 5 MG tablet TAKE AS DIRECTED BY COUMADIN CLINIC 90 tablet 0   No current facility-administered medications for this visit.     Allergies:   Ace inhibitors; Capoten [captopril]; and Neomycin-bacitracin zn-polymyx   Social History:  The patient  reports that  has never smoked. she has never used smokeless tobacco. She reports that she does not drink alcohol or use drugs.   Family History:  The patient's family history includes Diabetes in her brother and sister; Heart attack in her father; Stroke in her mother.  ROS:  Please see the history of present illness.  All other systems are reviewed and otherwise negative.   PHYSICAL EXAM:  VS:  BP 136/74   Pulse 70   Ht 5\' 2"  (1.575 m)   Wt 179 lb (81.2 kg)   SpO2 98%   BMI 32.74 kg/m  BMI: Body mass index is 32.74 kg/m. Well nourished, well developed, in no acute distress  HEENT: normocephalic, atraumatic  Neck: no JVD, carotid bruits or masses Cardiac:  RRR; no significant murmurs, no rubs, or gallops Lungs:  CTA b/l, no wheezing, rhonchi or rales  Abd: soft, nontender MS: no deformity, age appropriate atrophy Ext:  no edema  Skin: warm and dry, no rash Neuro:  No gross deficits  appreciated Psych: euthymic mood, full affect    EKG:  Done today reviewed by myself SR, 69bpm, PR 268ms, QRS 114ms  04/20/12: stress myoview Impression Exercise Capacity:  Lexiscan with no exercise. BP Response:  Normal blood pressure response. Clinical Symptoms:  There is dyspnea. ECG Impression:  No significant ST segment change suggestive of ischemia. Comparison with Prior Nuclear Study: No images to compare  Overall Impression:  Normal stress nuclear study with a small, fixed, distal anterior defect consistent with soft tissue attenuation; no ischemia. LV Ejection Fraction: 80%.  LV Wall Motion:  NL LV Function; NL Wall Motion  Recent Labs: 02/26/2017: ALT 21; BUN 24; Creatinine, Ser 0.96; Hemoglobin 10.4; Platelets 232.0; Potassium 4.5; Sodium 138; TSH 1.67  No results found for requested labs within last 8760 hours.   CrCl cannot be calculated (Patient's most recent lab result is older than the maximum 21 days allowed.).   Wt Readings from Last 3 Encounters:  07/09/17 179 lb (81.2 kg)  03/30/17 181 lb (82.1 kg)  03/05/17 178 lb (80.7 kg)     Other studies reviewed: Additional studies/records reviewed today include: summarized above  ASSESSMENT AND PLAN:  1. Paroxysmal Afib     CHA2DS2Vasc is 5, on warfarin     Rythmol  Intervals look OK today  2. HTN     No changes today   Disposition: F/u with me in 3 mo to monitor EKG intervals, sooner if needed.  If OK again will push out visits again.  Current medicines are reviewed at length with the patient today.  The patient did not have any concerns regarding medicines.  Venetia Night, PA-C 07/09/2017 11:00 AM     [address] [place] [address] [phone] (office)  [phone] (fax)

## 2017-07-09 ENCOUNTER — Encounter: Payer: Self-pay | Admitting: Physician Assistant

## 2017-07-09 ENCOUNTER — Ambulatory Visit (INDEPENDENT_AMBULATORY_CARE_PROVIDER_SITE_OTHER): Payer: Medicare Other | Admitting: Physician Assistant

## 2017-07-09 VITALS — BP 136/74 | HR 70 | Ht 62.0 in | Wt 179.0 lb

## 2017-07-09 DIAGNOSIS — Z79899 Other long term (current) drug therapy: Secondary | ICD-10-CM | POA: Diagnosis not present

## 2017-07-09 DIAGNOSIS — I480 Paroxysmal atrial fibrillation: Secondary | ICD-10-CM

## 2017-07-09 DIAGNOSIS — I10 Essential (primary) hypertension: Secondary | ICD-10-CM | POA: Diagnosis not present

## 2017-07-09 NOTE — Patient Instructions (Signed)
Medication Instructions:   Your physician recommends that you continue on your current medications as directed. Please refer to the Current Medication list given to you today.   If you need a refill on your cardiac medications before your next appointment, please call your pharmacy.  Labwork:  NONE ORDERED  TODAY    Testing/Procedures: NONE ORDERED  TODAY    Follow-Up:  3 MONTHS URSUY    Any Other Special Instructions Will Be Listed Below (If Applicable).

## 2017-07-17 ENCOUNTER — Telehealth: Payer: Self-pay | Admitting: Gastroenterology

## 2017-07-17 DIAGNOSIS — K8301 Primary sclerosing cholangitis: Secondary | ICD-10-CM

## 2017-07-17 DIAGNOSIS — K743 Primary biliary cirrhosis: Secondary | ICD-10-CM

## 2017-07-17 NOTE — Telephone Encounter (Signed)
CMP, CBC, PT/INR and RUQ [place] at 6 months. Office visit not needed unless something not in order with labs and [place].

## 2017-07-17 NOTE — Telephone Encounter (Signed)
Would you like her to have las prior to OV?

## 2017-07-17 NOTE — Telephone Encounter (Signed)
Patient wants to know if she was suppose to have 14M follow up US or just ov. And if follow up labs can be put in the system before her appt scheduled for 4.30.19.

## 2017-07-20 NOTE — Telephone Encounter (Signed)
Patient is due for labs 6 months from last OV last October.  She would like to come for labs prior to the office visit. [place] reminder is due in July

## 2017-08-06 ENCOUNTER — Ambulatory Visit (INDEPENDENT_AMBULATORY_CARE_PROVIDER_SITE_OTHER): Payer: Medicare Other | Admitting: *Deleted

## 2017-08-06 DIAGNOSIS — I480 Paroxysmal atrial fibrillation: Secondary | ICD-10-CM | POA: Diagnosis not present

## 2017-08-06 DIAGNOSIS — Z5181 Encounter for therapeutic drug level monitoring: Secondary | ICD-10-CM

## 2017-08-06 LAB — POCT INR: INR: 3.6

## 2017-08-06 NOTE — Patient Instructions (Signed)
Description   Do not take coumadin today March 28th then continue taking Coumadin 1 tablet daily except 1/2 tablet on Mondays, Wednesdays, and Fridays.  Recheck in 2 weeks. Continue eating 3 servings of greens a week. Call with new medications or any procedures  [phone]

## 2017-08-10 ENCOUNTER — Other Ambulatory Visit: Payer: Self-pay | Admitting: Internal Medicine

## 2017-08-24 ENCOUNTER — Ambulatory Visit (INDEPENDENT_AMBULATORY_CARE_PROVIDER_SITE_OTHER): Payer: Medicare Other | Admitting: *Deleted

## 2017-08-24 DIAGNOSIS — I480 Paroxysmal atrial fibrillation: Secondary | ICD-10-CM | POA: Diagnosis not present

## 2017-08-24 DIAGNOSIS — Z5181 Encounter for therapeutic drug level monitoring: Secondary | ICD-10-CM | POA: Diagnosis not present

## 2017-08-24 LAB — POCT INR: INR: 3.3

## 2017-08-24 NOTE — Patient Instructions (Signed)
Description   Do not take coumadin today,  then continue taking Coumadin 1 tablet daily except 1/2 tablet on Mondays, Wednesdays, and Fridays.  Recheck in 2 weeks. Continue eating 3 servings of greens a week. Call with new medications or any procedures  [phone]

## 2017-08-27 DIAGNOSIS — E119 Type 2 diabetes mellitus without complications: Secondary | ICD-10-CM | POA: Diagnosis not present

## 2017-08-27 DIAGNOSIS — I10 Essential (primary) hypertension: Secondary | ICD-10-CM | POA: Diagnosis not present

## 2017-08-27 DIAGNOSIS — E782 Mixed hyperlipidemia: Secondary | ICD-10-CM | POA: Diagnosis not present

## 2017-09-03 DIAGNOSIS — Z6833 Body mass index (BMI) 33.0-33.9, adult: Secondary | ICD-10-CM | POA: Diagnosis not present

## 2017-09-03 DIAGNOSIS — K743 Primary biliary cirrhosis: Secondary | ICD-10-CM | POA: Diagnosis not present

## 2017-09-03 DIAGNOSIS — N182 Chronic kidney disease, stage 2 (mild): Secondary | ICD-10-CM | POA: Diagnosis not present

## 2017-09-03 DIAGNOSIS — I10 Essential (primary) hypertension: Secondary | ICD-10-CM | POA: Diagnosis not present

## 2017-09-03 DIAGNOSIS — E119 Type 2 diabetes mellitus without complications: Secondary | ICD-10-CM | POA: Diagnosis not present

## 2017-09-03 DIAGNOSIS — E668 Other obesity: Secondary | ICD-10-CM | POA: Diagnosis not present

## 2017-09-03 DIAGNOSIS — D508 Other iron deficiency anemias: Secondary | ICD-10-CM | POA: Diagnosis not present

## 2017-09-03 DIAGNOSIS — D6489 Other specified anemias: Secondary | ICD-10-CM | POA: Diagnosis not present

## 2017-09-03 DIAGNOSIS — E782 Mixed hyperlipidemia: Secondary | ICD-10-CM | POA: Diagnosis not present

## 2017-09-03 DIAGNOSIS — I480 Paroxysmal atrial fibrillation: Secondary | ICD-10-CM | POA: Diagnosis not present

## 2017-09-03 LAB — IRON,TIBC AND FERRITIN PANEL
%SAT: 14
Ferritin: 22
TIBC: 341

## 2017-09-03 LAB — VITAMIN B12: Vitamin B-12: 344

## 2017-09-04 ENCOUNTER — Other Ambulatory Visit (INDEPENDENT_AMBULATORY_CARE_PROVIDER_SITE_OTHER): Payer: Medicare Other

## 2017-09-04 DIAGNOSIS — K743 Primary biliary cirrhosis: Secondary | ICD-10-CM | POA: Diagnosis not present

## 2017-09-04 LAB — CBC WITH DIFFERENTIAL/PLATELET
Basophils Absolute: 0 10*3/uL (ref 0.0–0.1)
Basophils Relative: 0.8 % (ref 0.0–3.0)
Eosinophils Absolute: 0 10*3/uL (ref 0.0–0.7)
Eosinophils Relative: 0.3 % (ref 0.0–5.0)
HCT: 31.4 % — ABNORMAL LOW (ref 36.0–46.0)
Hemoglobin: 10.4 g/dL — ABNORMAL LOW (ref 12.0–15.0)
Lymphocytes Relative: 20.9 % (ref 12.0–46.0)
Lymphs Abs: 1.3 10*3/uL (ref 0.7–4.0)
MCHC: 33.1 g/dL (ref 30.0–36.0)
MCV: 89.6 fl (ref 78.0–100.0)
Monocytes Absolute: 0.5 10*3/uL (ref 0.1–1.0)
Monocytes Relative: 7.7 % (ref 3.0–12.0)
Neutro Abs: 4.3 10*3/uL (ref 1.4–7.7)
Neutrophils Relative %: 70.3 % (ref 43.0–77.0)
Platelets: 228 10*3/uL (ref 150.0–400.0)
RBC: 3.51 Mil/uL — ABNORMAL LOW (ref 3.87–5.11)
RDW: 16.6 % — ABNORMAL HIGH (ref 11.5–15.5)
WBC: 6.1 10*3/uL (ref 4.0–10.5)

## 2017-09-04 LAB — COMPREHENSIVE METABOLIC PANEL
ALT: 15 U/L (ref 0–35)
AST: 19 U/L (ref 0–37)
Albumin: 3.7 g/dL (ref 3.5–5.2)
Alkaline Phosphatase: 107 U/L (ref 39–117)
BUN: 32 mg/dL — ABNORMAL HIGH (ref 6–23)
CO2: 30 mEq/L (ref 19–32)
Calcium: 9.5 mg/dL (ref 8.4–10.5)
Chloride: 103 mEq/L (ref 96–112)
Creatinine, Ser: 1.35 mg/dL — ABNORMAL HIGH (ref 0.40–1.20)
GFR: 40.03 mL/min — ABNORMAL LOW (ref >60.00)
Glucose, Bld: 200 mg/dL — ABNORMAL HIGH (ref 70–99)
Potassium: 4.5 mEq/L (ref 3.5–5.1)
Sodium: 142 mEq/L (ref 135–145)
Total Bilirubin: 0.4 mg/dL (ref 0.2–1.2)
Total Protein: 7.1 g/dL (ref 6.0–8.3)

## 2017-09-04 LAB — PROTIME-INR
INR: 2 ratio — ABNORMAL HIGH (ref 0.8–1.0)
Prothrombin Time: 23.3 s — ABNORMAL HIGH (ref 9.6–13.1)

## 2017-09-07 ENCOUNTER — Telehealth: Payer: Self-pay | Admitting: Physician Assistant

## 2017-09-07 ENCOUNTER — Other Ambulatory Visit: Payer: Self-pay

## 2017-09-07 DIAGNOSIS — K8301 Primary sclerosing cholangitis: Secondary | ICD-10-CM

## 2017-09-07 NOTE — Telephone Encounter (Signed)
Patient c/o Palpitations:  High priority if patient c/o lightheadedness, shortness of breath, or chest pain  How long have you had palpitations/irregular HR/ Afib? Are you having the symptoms now? Yes, last few days  Are you currently experiencing lightheadedness, SOB or CP?  No cp but yes sob when she walks  1) Do you have a history of afib (atrial fibrillation) or irregular heart rhythm? yes  2) Have you checked your BP or HR? (document readings if available):  143/84  Hr 97  3) Are you experiencing any other symptoms?  no

## 2017-09-07 NOTE — Telephone Encounter (Signed)
Spoke with pt who states she is feeling better. When she takes her pulse by hand, she is in the 70s. She stated she plans on keeping her appointment with [name] in a few weeks and will call back if she starts to feel bad again.

## 2017-09-08 ENCOUNTER — Ambulatory Visit (INDEPENDENT_AMBULATORY_CARE_PROVIDER_SITE_OTHER): Payer: Medicare Other | Admitting: Gastroenterology

## 2017-09-08 ENCOUNTER — Other Ambulatory Visit: Payer: Self-pay

## 2017-09-08 ENCOUNTER — Telehealth: Payer: Self-pay

## 2017-09-08 ENCOUNTER — Encounter: Payer: Self-pay | Admitting: Gastroenterology

## 2017-09-08 VITALS — BP 110/74 | HR 72 | Ht 62.0 in | Wt 182.0 lb

## 2017-09-08 DIAGNOSIS — K743 Primary biliary cirrhosis: Secondary | ICD-10-CM

## 2017-09-08 DIAGNOSIS — Z7901 Long term (current) use of anticoagulants: Secondary | ICD-10-CM | POA: Diagnosis not present

## 2017-09-08 DIAGNOSIS — Z86010 Personal history of colonic polyps: Secondary | ICD-10-CM

## 2017-09-08 MED ORDER — URSODIOL 300 MG PO CAPS: 300.0000 mg | ORAL_CAPSULE | Freq: Two times a day (BID) | ORAL | 1 refills | Status: DC

## 2017-09-08 MED ORDER — NA SULFATE-K SULFATE-MG SULF 17.5-3.13-1.6 GM/177ML PO SOLN: 1.0000 | Freq: Once | ORAL | 0 refills | Status: AC

## 2017-09-08 NOTE — Telephone Encounter (Signed)
   Primary Cardiologist:Steven Caryl Comes, MD  Chart reviewed as part of pre-operative protocol coverage.   We will route to CVRR to make recommendations regarding holding Coumadin for upcoming colonoscopy.  Final recommendations regarding clearance can be made by Jens Som, PA-C at f/u 10/01/17.  83 y.o. female with a hx of Parox AF controlled on Rythmol.  She is on coumadin for anticoagulation (CHADS2-VASc=5 - female, HTN, DM, agex2).  Last seen by Tommye Standard, PA-C 07/09/17.  FU appt scheduled for 10/01/17.   Richardson Dopp, PA-C  09/08/2017, 3:01 PM

## 2017-09-08 NOTE — Telephone Encounter (Signed)
Patient with diagnosis of Afib on warfarin for anticoagulation.    Procedure: colonoscopy Date of procedure: 11/17/17  CHADS2-VASc score of  5 (CHF, HTN, AGE, DM2, stroke/tia x 2, CAD, AGE, female)  Per office protocol, patient can hold warfarin for 5 days prior to procedure.

## 2017-09-08 NOTE — Telephone Encounter (Signed)
[facility] Pre-operative Risk Assessment     Request for surgical clearance:     Endoscopy Procedure  What type of surgery is being performed?     Colonoscopy  When is this surgery scheduled?     11/17/17  What type of clearance is required ?   Pharmacy  Are there any medications that need to be held prior to surgery and how long? Coumadin x 5 days  Practice name and name of physician performing surgery?      [facility]  What is your office phone and fax number?      Phone- [phone]  [phone]  Anesthesia type (None, local, MAC, general) ?       MAC

## 2017-09-08 NOTE — Progress Notes (Signed)
    History of Present Illness: This is an 83 year old female returning for follow-up of PBC and steatohepatitis.  Recent LFTs were normal.  Hemoglobin stable at 11.4.  Abdominal ultrasound performed in January showed findings compatible with cirrhosis, no focal liver lesion, cholelithiasis.  She has no GI complaints.  Current Medications, Allergies, Past Medical History, Past Surgical History, Family History and Social History were reviewed in [employer] record.  Physical Exam: General: Well developed, well nourished, no acute distress Head: Normocephalic and atraumatic Eyes:  sclerae anicteric, EOMI Ears: Normal auditory acuity Mouth: No deformity or lesions Lungs: Clear throughout to auscultation Heart: Regular rate and rhythm; no murmurs, rubs or bruits Abdomen: Soft, non tender and non distended. No masses, hepatosplenomegaly or hernias noted. Normal Bowel sounds Musculoskeletal: Symmetrical with no gross deformities  Pulses:  Normal pulses noted Extremities: No clubbing, cyanosis, edema or deformities noted Neurological: Alert oriented x 4, grossly nonfocal Psychological:  Alert and cooperative. Normal mood and affect  Assessment and Recommendations:    1.  PBC and steatohepatitis.  Continue Actigall 300 mg twice daily.  Repeat blood work, including AFP in 6 months and RUQ ultrasound in July 2019.  Encouraged a long-term weight loss program with a carb modified and fat modified diet.  2.  History of GU, erosive aside esophagitis, GERD.  Continue pantoprazole 40 mg daily and follow standard antireflux measures.  3. Cholelithiasis, asymptomatic.  4.  History of adenomatous colon polyps, overdue for surveillance colonoscopy. 5-year interval colonoscopy recall letter was sent to the patient however she does not recall receiving it. Schedule colonoscopy.  The risks (including bleeding, perforation, infection, missed lesions, medication reactions and possible  hospitalization or surgery if complications occur), benefits, and alternatives to colonoscopy with possible biopsy and possible polypectomy were discussed with the patient and they consent to proceed.   5.  Chronic warfarin anticoagulation. Hold warfarin 5 days before procedure - will instruct when and how to resume after procedure. Low but real risk of cardiovascular event such as heart attack, stroke, embolism, thrombosis or ischemia/infarct of other organs off warfarin explained and need to seek urgent help if this occurs. The patient consents to proceed. Will communicate by phone or EMR with patient's prescribing provider to confirm that holding warfarin is reasonable in this case.

## 2017-09-08 NOTE — Patient Instructions (Signed)
You have been scheduled for a colonoscopy. Please follow written instructions given to you at your visit today.  Please pick up your prep supplies at the pharmacy within the next 1-3 days. If you use inhalers (even only as needed), please bring them with you on the day of your procedure. Your physician has requested that you go to www.startemmi.com and enter the access code given to you at your visit today. This web site gives a general overview about your procedure. However, you should still follow specific instructions given to you by our office regarding your preparation for the procedure.  Normal BMI (Body Mass Index- based on height and weight) is between 23 and 30. Your BMI today is Body mass index is 33.29 kg/m. Marland Kitchen Please consider follow up  regarding your BMI with your Primary Care Provider.  Thank you for choosing me and [facility].  Pricilla Riffle. Dagoberto Ligas., MD., Marval Regal

## 2017-09-09 NOTE — Telephone Encounter (Signed)
  I will route this recommendation to the requesting party via Epic fax function and remove from pre-op pool.  Final recommendations regarding clearance can be made by Jens Som, PA-C at f/u 10/01/17.  Please call with questions.  [place], [place] 09/09/2017, 1:37 PM

## 2017-09-10 ENCOUNTER — Ambulatory Visit (INDEPENDENT_AMBULATORY_CARE_PROVIDER_SITE_OTHER): Payer: Medicare Other | Admitting: *Deleted

## 2017-09-10 DIAGNOSIS — I480 Paroxysmal atrial fibrillation: Secondary | ICD-10-CM | POA: Diagnosis not present

## 2017-09-10 DIAGNOSIS — Z5181 Encounter for therapeutic drug level monitoring: Secondary | ICD-10-CM

## 2017-09-10 LAB — POCT INR: INR: 2.8

## 2017-09-10 NOTE — Patient Instructions (Signed)
Description   Continue taking 1 tablet daily except 1/2 tablet on Mondays, Wednesdays and Fridays.  Recheck in 3 weeks. Continue eating 3 servings of greens a week. Call with new medications or any procedures  [phone]

## 2017-09-17 ENCOUNTER — Other Ambulatory Visit (INDEPENDENT_AMBULATORY_CARE_PROVIDER_SITE_OTHER): Payer: Medicare Other

## 2017-09-17 DIAGNOSIS — K8301 Primary sclerosing cholangitis: Secondary | ICD-10-CM

## 2017-09-17 LAB — BASIC METABOLIC PANEL
BUN: 29 mg/dL — ABNORMAL HIGH (ref 6–23)
CO2: 33 mEq/L — ABNORMAL HIGH (ref 19–32)
Calcium: 9.3 mg/dL (ref 8.4–10.5)
Chloride: 100 mEq/L (ref 96–112)
Creatinine, Ser: 1.11 mg/dL (ref 0.40–1.20)
GFR: 50.16 mL/min — ABNORMAL LOW (ref >60.00)
Glucose, Bld: 98 mg/dL (ref 70–99)
Potassium: 4 mEq/L (ref 3.5–5.1)
Sodium: 140 mEq/L (ref 135–145)

## 2017-09-18 ENCOUNTER — Telehealth: Payer: Self-pay

## 2017-09-18 NOTE — Telephone Encounter (Signed)
I spoke with her this morning and told her the lab results, recommendation to increase po intake and follow-up with her PCP.

## 2017-09-21 DIAGNOSIS — N183 Chronic kidney disease, stage 3 (moderate): Secondary | ICD-10-CM | POA: Diagnosis not present

## 2017-09-21 DIAGNOSIS — D508 Other iron deficiency anemias: Secondary | ICD-10-CM | POA: Diagnosis not present

## 2017-09-30 NOTE — Progress Notes (Deleted)
Cardiology Office Note Date:  09/30/2017  Patient ID:  Sara Hancock, DOB Aug 07, 1934, MRN [id number] PCP:  Rochel Brome, MD  Cardiologist:  Dr. Caryl Comes    Chief Complaint: f/u visit  History of Present Illness: Sara Hancock is a 83 y.o. female with history of arthritis, esophageal stenosis, hiatal hernia, GERD, HTN, HLD, DM, obesity, and PAFib, chronic anemia  She comes in ytoday to be seen for Dr. Caryl Comes, last seen by hhim in Nov 2018.  At that time reported she was doing well without symptoms or AF, tolerating Rythmol with some bradycardia, mild 1st degree AVblock. DATE PR interval QRSduration Dose  9/15 200 98 325  11/17 200 96 325  11/18 244 104 325  07/09/17 204           100         325   Noted intervals prolonged, with plans to re-evaluate in 3 mo, if not recovered plans to reduce her dose to 225mg , her BP was high and hydralazine was increased.  I saw her back in Feb 2019, she was feeling quite well.  She mentioned feeling her HR a little fast, lasted about 30 minutes without associated symptoms.  She denied any kind of CP or SOB, no DOE, no symptoms of PND or orthopnea, no dizziness, near syncope or syncope.  She was busy with housework and errands.  Doing stretching exercises.     *** symptoms *** Rythmol EKG *** bleeding/warfarin *** She follows with coumadin clinic, no bleeding or signs of bleeding.  Past Medical History:  Diagnosis Date  . Anemia   . Arthritis   . Biliary cirrhosis ([place])   . Cholelithiasis 01/09/12  . Diverticulosis of colon with hemorrhage 06/08/2000  . Esophageal stenosis 01/08/11  . Esophagitis 01/08/11  . GERD (gastroesophageal reflux disease)   . Hiatal hernia 01/08/11  . HTN (hypertension)   . Hx of adenomatous colonic polyps 01/08/11  . Hyperplastic colon polyp 06/08/2000, 07/31/2003, 01/08/11  . Obesity   . Osteopenia   . PAF (paroxysmal atrial fibrillation) (HCC)    occurring in the setting of bradycardia and mild first degree AV  block followed by Dr. Jens Som  . Pure hypercholesterolemia   . Type II or unspecified type diabetes mellitus without mention of complication, not stated as uncontrolled   . Uterine fibroid 01/09/12    Past Surgical History:  Procedure Laterality Date  . BREAST CYST EXCISION     left  . COLONOSCOPY    . INSERTION OF MESH  05/18/2012   Procedure: INSERTION OF MESH;  Surgeon: Adin Hector, MD;  Location: [facility];  Service: General;  Laterality: N/A;  . LIVER BIOPSY  05/18/2012   Procedure: LIVER BIOPSY;  Surgeon: Adin Hector, MD;  Location: [facility];  Service: General;;  . POLYPECTOMY    . THYROIDECTOMY, PARTIAL  1980  . TUBAL LIGATION  1972  . VENTRAL HERNIA REPAIR  05/18/2012   Procedure: LAPAROSCOPIC VENTRAL HERNIA;  Surgeon: Adin Hector, MD;  Location: [facility];  Service: General;  Laterality: N/A;  Laparoscopic Ventral Wall Hernia with Mesh    Current Outpatient Medications  Medication Sig Dispense Refill  . calcium-vitamin D (OSCAL WITH D) 250-125 MG-UNIT per tablet Take 1 tablet by mouth daily.    . Ferrous Sulfate (IRON) 325 (65 FE) MG TABS Take 325 mg by mouth daily. 30 each 0  . fexofenadine (ALLEGRA) 180 MG tablet Take 180 mg by mouth daily.     Marland Kitchen  folic acid (FOLVITE) 272 MCG tablet Take 400 mcg by mouth daily.    . furosemide (LASIX) 40 MG tablet Take 80 mg by mouth every morning.     . hydrALAZINE (APRESOLINE) 50 MG tablet Take 1 tablet (50 mg total) 2 (two) times daily by mouth. 180 tablet 1  . insulin detemir (LEVEMIR FLEXPEN) 100 UNIT/ML injection Inject 25 Units into the skin as directed. 18 units at bedtime and 32 units in the a.m.    . irbesartan (AVAPRO) 300 MG tablet Take 300 mg by mouth every evening.    Marland Kitchen ketoconazole (NIZORAL) 2 % cream Apply 1 application topically daily as needed. Around skin folds for irritation    . metFORMIN (GLUCOPHAGE) 1000 MG tablet Take 1,000 mg by mouth 2 (two) times daily with a meal.     . metoprolol (LOPRESSOR) 50 MG tablet Take 25 mg  by mouth 2 (two) times daily.     . Multiple Vitamin (MULTIVITAMIN WITH MINERALS) TABS Take 1 tablet by mouth daily.    . pantoprazole (PROTONIX) 40 MG tablet TAKE 1 TABLET(40 MG) BY MOUTH DAILY 90 tablet 3  . propafenone (RYTHMOL SR) 325 MG 12 hr capsule TAKE 1 CAPSULE(325 MG) BY MOUTH TWICE DAILY 180 capsule 3  . simvastatin (ZOCOR) 40 MG tablet Take 40 mg by mouth every evening.     . sitaGLIPtin (JANUVIA) 100 MG tablet Take 100 mg by mouth at bedtime.     . ursodiol (ACTIGALL) 300 MG capsule Take 1 capsule (300 mg total) by mouth 2 (two) times daily. 180 capsule 1  . vitamin C (ASCORBIC ACID) 500 MG tablet Take 500 mg by mouth daily.    . vitamin E 400 UNIT capsule Take 400 Units by mouth daily.    Marland Kitchen warfarin (COUMADIN) 5 MG tablet TAKE AS DIRECTED BY COUMADIN CLINIC 90 tablet 0   No current facility-administered medications for this visit.     Allergies:   Ace inhibitors; Capoten [captopril]; and Neomycin-bacitracin zn-polymyx   Social History:  The patient  reports that she has never smoked. She has never used smokeless tobacco. She reports that she does not drink alcohol or use drugs.   Family History:  The patient's family history includes Diabetes in her brother and sister; Heart attack in her father; Stroke in her mother.  ROS:  Please see the history of present illness.  All other systems are reviewed and otherwise negative.   PHYSICAL EXAM:  VS:  There were no vitals taken for this visit. BMI: There is no height or weight on file to calculate BMI. Well nourished, well developed, in no acute distress  HEENT: normocephalic, atraumatic  Neck: no JVD, carotid bruits or masses Cardiac:  *** RRR; no significant murmurs, no rubs, or gallops Lungs: ***  CTA b/l, no wheezing, rhonchi or rales  Abd: soft, nontender MS: no deformity, age appropriate atrophy Ext: *** no edema  Skin: warm and dry, no rash Neuro:  No gross deficits appreciated Psych: euthymic mood, full  affect    EKG:  Done today reviewed by myself ***  04/20/12: stress myoview Impression Exercise Capacity:  Lexiscan with no exercise. BP Response:  Normal blood pressure response. Clinical Symptoms:  There is dyspnea. ECG Impression:  No significant ST segment change suggestive of ischemia. Comparison with Prior Nuclear Study: No images to compare Overall Impression:  Normal stress nuclear study with a small, fixed, distal anterior defect consistent with soft tissue attenuation; no ischemia. LV Ejection Fraction: 80%.  LV Wall Motion:  NL LV Function; NL Wall Motion  Recent Labs: 02/26/2017: TSH 1.67 09/04/2017: ALT 15; Hemoglobin 10.4; Platelets 228.0 09/17/2017: BUN 29; Creatinine, Ser 1.11; Potassium 4.0; Sodium 140  No results found for requested labs within last 8760 hours.   CrCl cannot be calculated (Unknown ideal weight.).   Wt Readings from Last 3 Encounters:  09/08/17 182 lb (82.6 kg)  07/09/17 179 lb (81.2 kg)  03/30/17 181 lb (82.1 kg)     Other studies reviewed: Additional studies/records reviewed today include: summarized above  ASSESSMENT AND PLAN:  1. Paroxysmal Afib     CHA2DS2Vasc is 5, on warfarin     *** Rythmol  Intervals look OK today  2. HTN     *** No changes today   Disposition: ***  Current medicines are reviewed at length with the patient today.  The patient did not have any concerns regarding medicines.  Venetia Night, PA-C 09/30/2017 7:28 PM     [place] [address] [place] [address] [phone] (office)  [phone] (fax)

## 2017-10-01 ENCOUNTER — Ambulatory Visit (INDEPENDENT_AMBULATORY_CARE_PROVIDER_SITE_OTHER): Payer: Medicare Other

## 2017-10-01 ENCOUNTER — Ambulatory Visit: Payer: Medicare Other | Admitting: Physician Assistant

## 2017-10-01 DIAGNOSIS — Z5181 Encounter for therapeutic drug level monitoring: Secondary | ICD-10-CM | POA: Diagnosis not present

## 2017-10-01 DIAGNOSIS — I480 Paroxysmal atrial fibrillation: Secondary | ICD-10-CM

## 2017-10-01 LAB — POCT INR: INR: 2.6 (ref 2.0–3.0)

## 2017-10-01 NOTE — Patient Instructions (Signed)
Description   Continue on same dosage 1 tablet daily except 1/2 tablet on Mondays, Wednesdays and Fridays.  Recheck in 4 weeks. Continue eating 3 servings of greens a week. Call with new medications or any procedures  [phone]

## 2017-10-02 NOTE — Telephone Encounter (Signed)
Patient was seen on 10/01/17 and we were informed you would give recommendations on warfarin clearance prior to procedure scheduled for 11/17/17. Please advise.

## 2017-10-08 DIAGNOSIS — L304 Erythema intertrigo: Secondary | ICD-10-CM | POA: Diagnosis not present

## 2017-10-08 DIAGNOSIS — R609 Edema, unspecified: Secondary | ICD-10-CM | POA: Diagnosis not present

## 2017-10-08 DIAGNOSIS — D2272 Melanocytic nevi of left lower limb, including hip: Secondary | ICD-10-CM | POA: Diagnosis not present

## 2017-10-08 DIAGNOSIS — Z86018 Personal history of other benign neoplasm: Secondary | ICD-10-CM | POA: Diagnosis not present

## 2017-10-08 DIAGNOSIS — I872 Venous insufficiency (chronic) (peripheral): Secondary | ICD-10-CM | POA: Diagnosis not present

## 2017-10-08 DIAGNOSIS — D225 Melanocytic nevi of trunk: Secondary | ICD-10-CM | POA: Diagnosis not present

## 2017-10-08 DIAGNOSIS — D692 Other nonthrombocytopenic purpura: Secondary | ICD-10-CM | POA: Diagnosis not present

## 2017-10-08 DIAGNOSIS — Z85828 Personal history of other malignant neoplasm of skin: Secondary | ICD-10-CM | POA: Diagnosis not present

## 2017-10-08 DIAGNOSIS — L821 Other seborrheic keratosis: Secondary | ICD-10-CM | POA: Diagnosis not present

## 2017-10-08 DIAGNOSIS — D235 Other benign neoplasm of skin of trunk: Secondary | ICD-10-CM | POA: Diagnosis not present

## 2017-10-08 DIAGNOSIS — D2230 Melanocytic nevi of unspecified part of face: Secondary | ICD-10-CM | POA: Diagnosis not present

## 2017-10-12 ENCOUNTER — Telehealth: Payer: Self-pay

## 2017-10-12 NOTE — Telephone Encounter (Signed)
This patient had an appointment 5/23 for pre op clearance but had to cancel it (her spouse passed away). It was rescheduled for 7/11- though her colonoscopy was scheduled for 7/9. I will see if she can be seen by an APP sooner for pre op clearance. As far as her Coumadin goes, that has been addressed- OK to hold 5 days pre op if needed.  Kerin Ransom PA-C 10/12/2017 1:37 PM

## 2017-10-12 NOTE — Telephone Encounter (Signed)
Called patient see preop clearance note.

## 2017-10-12 NOTE — Telephone Encounter (Signed)
See phone note attempted to call patient at 1:31pm. Left message for patient to contact office.

## 2017-10-13 NOTE — Telephone Encounter (Signed)
Patient has an appointment with Tommye Standard, PA on 6/6. Will route message to her so that she is aware of pending cardiac clearance.

## 2017-10-14 DIAGNOSIS — J0100 Acute maxillary sinusitis, unspecified: Secondary | ICD-10-CM | POA: Diagnosis not present

## 2017-10-14 NOTE — Progress Notes (Signed)
Cardiology Office Note Date:  10/15/2017  Patient ID:  Sara Hancock, DOB [birth year], MRN [id number] PCP:  Rochel Brome, MD  Cardiologist:  Dr. Caryl Comes    Chief Complaint: f/u visit, need colonoscopy clearance   History of Present Illness: Sara Hancock is a 83 y.o. female with history of arthritis, esophageal stenosis, hiatal hernia, GERD, HTN, HLD, DM, obesity, and PAFib, chronic anemia  She comes in ytoday to be seen for Dr. Caryl Comes, last seen by hhim in Nov 2018.  At that time reported she was doing well without symptoms or AF, tolerating Rythmol with some bradycardia, mild 1st degree AVblock.Noted intervals prolonged, with plans to re-evaluate in 3 mo, if not recovered plans to reduce her dose to 225mg , her BP was high and hydralazine was increased.  DATE PR interval QRSduration Dose  9/15 200 98 325  11/17 200 96 325  11/18 244 104 325  07/09/17 204           100         325          10/15/17                   AF                       98                   325    I saw her back in Feb 2019, she was feeling quite well.  She mentioned feeling her HR a little fast, lasted about 30 minutes without associated symptoms.  She denied any kind of CP or SOB, no DOE, no symptoms of PND or orthopnea, no dizziness, near syncope or syncope.  She was busy with housework and errands.  Doing stretching exercises.  ECG intervals were OK, no changes were made to her Propafenone.  Sadly her husband passed away unexpectedly 2 weeks ago and is making that transition though reports excellent family and church support, and doing "OK".  She physically is feeling well.  She denies any kind of CP, palpitations or SOB, no cardiac awareness at all.  No dizziness, near syncope or syncope.  She denies symptoms of PND or orthopnea.  No exertional intolerances.  She is pending a colonoscopy for routine screening, she thinks is the 1st week of July.  RCRI: 1 point, risk 0.9%  DASI score is 42.7, 7.9METS  Past  Medical History:  Diagnosis Date  . Anemia   . Arthritis   . Biliary cirrhosis ([place])   . Cholelithiasis 01/09/12  . Diverticulosis of colon with hemorrhage 06/08/2000  . Esophageal stenosis 01/08/11  . Esophagitis 01/08/11  . GERD (gastroesophageal reflux disease)   . Hiatal hernia 01/08/11  . HTN (hypertension)   . Hx of adenomatous colonic polyps 01/08/11  . Hyperplastic colon polyp 06/08/2000, 07/31/2003, 01/08/11  . Obesity   . Osteopenia   . PAF (paroxysmal atrial fibrillation) ([facility])    occurring in the setting of bradycardia and mild first degree AV block followed by Dr. Jens Som  . Pure hypercholesterolemia   . Type II or unspecified type diabetes mellitus without mention of complication, not stated as uncontrolled   . Uterine fibroid 01/09/12    Past Surgical History:  Procedure Laterality Date  . BREAST CYST EXCISION     left  . COLONOSCOPY    . INSERTION OF MESH  05/18/2012   Procedure: INSERTION OF MESH;  Surgeon: Adin Hector, MD;  Location: [facility];  Service: General;  Laterality: N/A;  . LIVER BIOPSY  05/18/2012   Procedure: LIVER BIOPSY;  Surgeon: Adin Hector, MD;  Location: [facility];  Service: General;;  . POLYPECTOMY    . THYROIDECTOMY, PARTIAL  1980  . TUBAL LIGATION  1972  . VENTRAL HERNIA REPAIR  05/18/2012   Procedure: LAPAROSCOPIC VENTRAL HERNIA;  Surgeon: Adin Hector, MD;  Location: [facility];  Service: General;  Laterality: N/A;  Laparoscopic Ventral Wall Hernia with Mesh    Current Outpatient Medications  Medication Sig Dispense Refill  . calcium-vitamin D (OSCAL WITH D) 250-125 MG-UNIT per tablet Take 1 tablet by mouth daily.    . Ferrous Sulfate (IRON) 325 (65 FE) MG TABS Take 325 mg by mouth daily. 30 each 0  . fexofenadine (ALLEGRA) 180 MG tablet Take 180 mg by mouth daily.     . folic acid (FOLVITE) 570 MCG tablet Take 400 mcg by mouth daily.    . furosemide (LASIX) 40 MG tablet Take 80 mg by mouth every morning.     . hydrALAZINE (APRESOLINE) 50 MG  tablet Take 1 tablet (50 mg total) 2 (two) times daily by mouth. 180 tablet 1  . insulin detemir (LEVEMIR FLEXPEN) 100 UNIT/ML injection Inject 25 Units into the skin as directed. 18 units at bedtime and 32 units in the a.m.    . irbesartan (AVAPRO) 300 MG tablet Take 300 mg by mouth every evening.    Marland Kitchen ketoconazole (NIZORAL) 2 % cream Apply 1 application topically daily as needed. Around skin folds for irritation    . metFORMIN (GLUCOPHAGE) 1000 MG tablet Take 1,000 mg by mouth 2 (two) times daily with a meal.     . metoprolol (LOPRESSOR) 50 MG tablet Take 25 mg by mouth 2 (two) times daily.     . Multiple Vitamin (MULTIVITAMIN WITH MINERALS) TABS Take 1 tablet by mouth daily.    . pantoprazole (PROTONIX) 40 MG tablet TAKE 1 TABLET(40 MG) BY MOUTH DAILY 90 tablet 3  . propafenone (RYTHMOL SR) 325 MG 12 hr capsule TAKE 1 CAPSULE(325 MG) BY MOUTH TWICE DAILY 180 capsule 3  . simvastatin (ZOCOR) 40 MG tablet Take 40 mg by mouth every evening.     . sitaGLIPtin (JANUVIA) 100 MG tablet Take 100 mg by mouth at bedtime.     . ursodiol (ACTIGALL) 300 MG capsule Take 1 capsule (300 mg total) by mouth 2 (two) times daily. 180 capsule 1  . vitamin C (ASCORBIC ACID) 500 MG tablet Take 500 mg by mouth daily.    . vitamin E 400 UNIT capsule Take 400 Units by mouth daily.    Marland Kitchen warfarin (COUMADIN) 5 MG tablet TAKE AS DIRECTED BY COUMADIN CLINIC 90 tablet 0   No current facility-administered medications for this visit.     Allergies:   Ace inhibitors; Capoten [captopril]; and Neomycin-bacitracin zn-polymyx   Social History:  The patient  reports that she has never smoked. She has never used smokeless tobacco. She reports that she does not drink alcohol or use drugs.   Family History:  The patient's family history includes Diabetes in her brother and sister; Heart attack in her father; Stroke in her mother.  ROS:  Please see the history of present illness.  All other systems are reviewed and otherwise  negative.   PHYSICAL EXAM:  VS:  Ht 5\' 2"  (1.575 m)   Wt 176 lb (79.8 kg)   BMI 32.19  kg/m  BMI: Body mass index is 32.19 kg/m. Well nourished, well developed, in no acute distress  HEENT: normocephalic, atraumatic  Neck: no JVD, carotid bruits or masses Cardiac:  iRRR; no significant murmurs, no rubs, or gallops Lungs: CTA b/l, no wheezing, rhonchi or rales  Abd: soft, nontender MS: no deformity, age appropriate atrophy Ext: no edema  Skin: warm and dry, no rash Neuro:  No gross deficits appreciated Psych: euthymic mood, full affect    EKG:  Done today reviewed by myself AFib, 93bpm, QRS 71ms, QTc 485ms  04/20/12: stress myoview Impression Exercise Capacity:  Lexiscan with no exercise. BP Response:  Normal blood pressure response. Clinical Symptoms:  There is dyspnea. ECG Impression:  No significant ST segment change suggestive of ischemia. Comparison with Prior Nuclear Study: No images to compare Overall Impression:  Normal stress nuclear study with a small, fixed, distal anterior defect consistent with soft tissue attenuation; no ischemia. LV Ejection Fraction: 80%.  LV Wall Motion:  NL LV Function; NL Wall Motion  Recent Labs: 02/26/2017: TSH 1.67 09/04/2017: ALT 15; Hemoglobin 10.4; Platelets 228.0 09/17/2017: BUN 29; Creatinine, Ser 1.11; Potassium 4.0; Sodium 140  No results found for requested labs within last 8760 hours.   CrCl cannot be calculated (Patient's most recent lab result is older than the maximum 21 days allowed.).   Wt Readings from Last 3 Encounters:  10/15/17 176 lb (79.8 kg)  09/08/17 182 lb (82.6 kg)  07/09/17 179 lb (81.2 kg)     Other studies reviewed: Additional studies/records reviewed today include: summarized above  ASSESSMENT AND PLAN:  1. Paroxysmal Afib     CHA2DS2Vasc is 5, on warfarin, monitored and managed by her PMD     Rythmol  In rate controlled AFib today, asymptomatic, perhaps provoked by the recent emotional stress with  her husband's death.   Will update/get an echo (not needed prior to her colonoscopy) and plan for her to see the AFib clinic in a couple weeks to re-assess rhythm.   Will not pursue DCCV now given she will be having an interruption in her warfarin for her colonoscopy.  2. HTN     Looks OK     No changes today  3. Colonoscopy     She has low cardiac risk score, low risk procedure, good DUKE score, no need for further pre-procedure cardiac testing is required      [facility] has already addressed her warfarin, cleared for 5 days off pre-colonoscopy, she will resume ASAP at her GI MD recommendation post procedure.    Disposition: AFib clinic in 2 weeks, here in 3-4 months, sooner if needed.  Current medicines are reviewed at length with the patient today.  The patient did not have any concerns regarding medicines.  Venetia Night, PA-C 10/15/2017 9:49 AM     [facility] [address] [place] [address] [phone] (office)  [phone] (fax)

## 2017-10-15 ENCOUNTER — Ambulatory Visit (INDEPENDENT_AMBULATORY_CARE_PROVIDER_SITE_OTHER): Payer: Medicare Other | Admitting: Physician Assistant

## 2017-10-15 VITALS — BP 134/82 | HR 93 | Ht 62.0 in | Wt 176.0 lb

## 2017-10-15 DIAGNOSIS — Z01818 Encounter for other preprocedural examination: Secondary | ICD-10-CM

## 2017-10-15 DIAGNOSIS — I10 Essential (primary) hypertension: Secondary | ICD-10-CM

## 2017-10-15 DIAGNOSIS — I480 Paroxysmal atrial fibrillation: Secondary | ICD-10-CM | POA: Diagnosis not present

## 2017-10-15 NOTE — Telephone Encounter (Signed)
See Cardiology office note on 10/15/17.   Per note: [facility] has already addressed her warfarin, cleared for 5 days off pre-colonoscopy, she will resume ASAP at her GI MD recommendation post procedure.

## 2017-10-15 NOTE — Patient Instructions (Addendum)
Medication Instructions:   Your physician recommends that you continue on your current medications as directed. Please refer to the Current Medication list given to you today.   If you need a refill on your cardiac medications before your next appointment, please call your pharmacy.  Labwork: NONE ORDERED  TODAY    Testing/Procedures:  NONE ORDERED  TODAY    Follow-Up:  WITH AFIB CLINIC 2 WEEKS   PARKING CODE 1300 FOR AFIB CLINIC  IN  2 TO 3 MONTHS WITH DR Caryl Comes    Any Other Special Instructions Will Be Listed Below (If Applicable).

## 2017-10-29 ENCOUNTER — Ambulatory Visit (INDEPENDENT_AMBULATORY_CARE_PROVIDER_SITE_OTHER): Payer: Medicare Other | Admitting: *Deleted

## 2017-10-29 DIAGNOSIS — I480 Paroxysmal atrial fibrillation: Secondary | ICD-10-CM

## 2017-10-29 DIAGNOSIS — Z5181 Encounter for therapeutic drug level monitoring: Secondary | ICD-10-CM | POA: Diagnosis not present

## 2017-10-29 LAB — POCT INR: INR: 3.7 — AB (ref 2.0–3.0)

## 2017-10-29 NOTE — Patient Instructions (Addendum)
Description   Hold coumadin today June 20th then continue on same dosage 1 tablet daily except 1/2 tablet on Mondays, Wednesdays and Fridays.  Recheck in 2 weeks. Continue eating 3 servings of greens a week. Call with new medications or any procedures  [phone] Is scheduled for colonoscopy on July 9th see office note of 10/15/2017 and clearance note of 09/08/2017

## 2017-11-02 ENCOUNTER — Ambulatory Visit (HOSPITAL_COMMUNITY)
Admission: RE | Admit: 2017-11-02 | Discharge: 2017-11-02 | Disposition: A | Discharge status: Home / Self Care | Payer: Medicare Other | Source: Ambulatory Visit | Attending: Nurse Practitioner | Admitting: Nurse Practitioner

## 2017-11-02 ENCOUNTER — Other Ambulatory Visit: Payer: Self-pay

## 2017-11-02 ENCOUNTER — Encounter (HOSPITAL_COMMUNITY): Payer: Self-pay | Admitting: Nurse Practitioner

## 2017-11-02 ENCOUNTER — Ambulatory Visit (HOSPITAL_BASED_OUTPATIENT_CLINIC_OR_DEPARTMENT_OTHER): Payer: Medicare Other

## 2017-11-02 VITALS — BP 128/74 | HR 93 | Ht 62.0 in | Wt 179.0 lb

## 2017-11-02 DIAGNOSIS — Z794 Long term (current) use of insulin: Secondary | ICD-10-CM | POA: Insufficient documentation

## 2017-11-02 DIAGNOSIS — M8580 Other specified disorders of bone density and structure, unspecified site: Secondary | ICD-10-CM | POA: Insufficient documentation

## 2017-11-02 DIAGNOSIS — I4819 Other persistent atrial fibrillation: Secondary | ICD-10-CM

## 2017-11-02 DIAGNOSIS — I481 Persistent atrial fibrillation: Secondary | ICD-10-CM | POA: Diagnosis not present

## 2017-11-02 DIAGNOSIS — Z79899 Other long term (current) drug therapy: Secondary | ICD-10-CM | POA: Insufficient documentation

## 2017-11-02 DIAGNOSIS — E669 Obesity, unspecified: Secondary | ICD-10-CM | POA: Diagnosis not present

## 2017-11-02 DIAGNOSIS — Z6832 Body mass index (BMI) 32.0-32.9, adult: Secondary | ICD-10-CM | POA: Insufficient documentation

## 2017-11-02 DIAGNOSIS — Z7901 Long term (current) use of anticoagulants: Secondary | ICD-10-CM | POA: Diagnosis not present

## 2017-11-02 DIAGNOSIS — E7800 Pure hypercholesterolemia, unspecified: Secondary | ICD-10-CM | POA: Diagnosis not present

## 2017-11-02 DIAGNOSIS — E119 Type 2 diabetes mellitus without complications: Secondary | ICD-10-CM | POA: Diagnosis not present

## 2017-11-02 DIAGNOSIS — K219 Gastro-esophageal reflux disease without esophagitis: Secondary | ICD-10-CM | POA: Insufficient documentation

## 2017-11-02 DIAGNOSIS — Z01818 Encounter for other preprocedural examination: Secondary | ICD-10-CM | POA: Diagnosis not present

## 2017-11-02 DIAGNOSIS — I10 Essential (primary) hypertension: Secondary | ICD-10-CM | POA: Insufficient documentation

## 2017-11-02 LAB — ECHOCARDIOGRAM COMPLETE
Height: 62 in
Weight: 2864 oz

## 2017-11-02 NOTE — Progress Notes (Signed)
Primary Care Physician: Rochel Brome, MD Referring Physician: Tommye Standard, PA EP: Dr. Marolyn Haller is a 83 y.o. female with a h/o afib on propafenone. She had been staying in [place] but,  sadly her husband passed away unexpectedly 4 weeks ago and is making that transition though reports excellent family and church support, and doing "OK".  She physically is feeling well. She saw [name] early June and was in afib , pending colonoscopy early July. She denies any kind of CP, palpitations or SOB, no cardiac awareness at all.  No dizziness, near syncope or syncope.  She denies symptoms of PND or orthopnea.  No exertional intolerances.   In the afib clinic 6/24 for f/u. She is not aware of afib. Still plans  to have colonoscopy and has instructions re stopping her coumadin soon. She is rate controlled. Unfortunately, cannot do much to change antiarrythmic's or plan for  cardioversion 2/2 the planned interruption of her anticoagulation.  Today, she denies symptoms of palpitations, chest pain, shortness of breath, orthopnea, PND, lower extremity edema, dizziness, presyncope, syncope, or neurologic sequela. The patient is tolerating medications without difficulties and is otherwise without complaint today.   Past Medical History:  Diagnosis Date  . Anemia   . Arthritis   . Biliary cirrhosis ([place])   . Cholelithiasis 01/09/12  . Diverticulosis of colon with hemorrhage 06/08/2000  . Esophageal stenosis 01/08/11  . Esophagitis 01/08/11  . GERD (gastroesophageal reflux disease)   . Hiatal hernia 01/08/11  . HTN (hypertension)   . Hx of adenomatous colonic polyps 01/08/11  . Hyperplastic colon polyp 06/08/2000, 07/31/2003, 01/08/11  . Obesity   . Osteopenia   . PAF (paroxysmal atrial fibrillation) (HCC)    occurring in the setting of bradycardia and mild first degree AV block followed by Dr. Jens Som  . Pure hypercholesterolemia   . Type II or unspecified type diabetes mellitus without mention of  complication, not stated as uncontrolled   . Uterine fibroid 01/09/12   Past Surgical History:  Procedure Laterality Date  . BREAST CYST EXCISION     left  . COLONOSCOPY    . INSERTION OF MESH  05/18/2012   Procedure: INSERTION OF MESH;  Surgeon: Adin Hector, MD;  Location: [facility];  Service: General;  Laterality: N/A;  . LIVER BIOPSY  05/18/2012   Procedure: LIVER BIOPSY;  Surgeon: Adin Hector, MD;  Location: [facility];  Service: General;;  . POLYPECTOMY    . THYROIDECTOMY, PARTIAL  1980  . TUBAL LIGATION  1972  . VENTRAL HERNIA REPAIR  05/18/2012   Procedure: LAPAROSCOPIC VENTRAL HERNIA;  Surgeon: Adin Hector, MD;  Location: [facility];  Service: General;  Laterality: N/A;  Laparoscopic Ventral Wall Hernia with Mesh    Current Outpatient Medications  Medication Sig Dispense Refill  . calcium-vitamin D (OSCAL WITH D) 250-125 MG-UNIT per tablet Take 1 tablet by mouth daily.    . Ferrous Sulfate (IRON) 325 (65 FE) MG TABS Take 325 mg by mouth daily. 30 each 0  . fexofenadine (ALLEGRA) 180 MG tablet Take 180 mg by mouth daily.     . folic acid (FOLVITE) 629 MCG tablet Take 400 mcg by mouth daily.    . furosemide (LASIX) 40 MG tablet Take 80 mg by mouth every morning.     . hydrALAZINE (APRESOLINE) 50 MG tablet Take 1 tablet (50 mg total) 2 (two) times daily by mouth. 180 tablet 1  . insulin detemir (LEVEMIR FLEXPEN) 100  UNIT/ML injection Inject 25 Units into the skin as directed. 18 units at bedtime and 32 units in the a.m.    . irbesartan (AVAPRO) 300 MG tablet Take 300 mg by mouth every evening.    Marland Kitchen ketoconazole (NIZORAL) 2 % cream Apply 1 application topically daily as needed. Around skin folds for irritation    . metFORMIN (GLUCOPHAGE) 1000 MG tablet Take 1,000 mg by mouth 2 (two) times daily with a meal.     . metoprolol (LOPRESSOR) 50 MG tablet Take 25 mg by mouth as directed. Take 1/2 tablet in the morning and 1 tablet in the evening    . Multiple Vitamin (MULTIVITAMIN WITH  MINERALS) TABS Take 1 tablet by mouth daily.    . pantoprazole (PROTONIX) 40 MG tablet TAKE 1 TABLET(40 MG) BY MOUTH DAILY 90 tablet 3  . propafenone (RYTHMOL SR) 325 MG 12 hr capsule TAKE 1 CAPSULE(325 MG) BY MOUTH TWICE DAILY 180 capsule 3  . simvastatin (ZOCOR) 40 MG tablet Take 40 mg by mouth every evening.     . sitaGLIPtin (JANUVIA) 100 MG tablet Take 100 mg by mouth at bedtime.     . ursodiol (ACTIGALL) 300 MG capsule Take 1 capsule (300 mg total) by mouth 2 (two) times daily. 180 capsule 1  . vitamin C (ASCORBIC ACID) 500 MG tablet Take 500 mg by mouth daily.    . vitamin E 400 UNIT capsule Take 400 Units by mouth daily.    Marland Kitchen warfarin (COUMADIN) 5 MG tablet TAKE AS DIRECTED BY COUMADIN CLINIC 90 tablet 0   No current facility-administered medications for this encounter.     Allergies  Allergen Reactions  . Ace Inhibitors Anaphylaxis  . Capoten [Captopril] Anaphylaxis    Throat swells shut  . Neomycin-Bacitracin Zn-Polymyx Rash    Social History   Socioeconomic History  . Marital status: Married    Spouse name: Not on file  . Number of children: 2  . Years of education: Not on file  . Highest education level: Not on file  Occupational History  . Occupation: retired    Fish farm manager: RETIRED  Social Needs  . Financial resource strain: Not on file  . Food insecurity:    Worry: Not on file    Inability: Not on file  . Transportation needs:    Medical: Not on file    Non-medical: Not on file  Tobacco Use  . Smoking status: Never Smoker  . Smokeless tobacco: Never Used  Substance and Sexual Activity  . Alcohol use: No  . Drug use: No  . Sexual activity: Not Currently  Lifestyle  . Physical activity:    Days per week: Not on file    Minutes per session: Not on file  . Stress: Not on file  Relationships  . Social connections:    Talks on phone: Not on file    Gets together: Not on file    Attends religious service: Not on file    Active member of club or  organization: Not on file    Attends meetings of clubs or organizations: Not on file    Relationship status: Not on file  . Intimate partner violence:    Fear of current or ex partner: Not on file    Emotionally abused: Not on file    Physically abused: Not on file    Forced sexual activity: Not on file  Other Topics Concern  . Not on file  Social History Narrative  . Not on file  Family History  Problem Relation Age of Onset  . Heart attack Father   . Stroke Mother   . Diabetes Sister        x 2  . Diabetes Brother   . Colon cancer Neg Hx     ROS- All systems are reviewed and negative except as per the HPI above  Physical Exam: Vitals:   11/02/17 1057  BP: 128/74  Pulse: 93  Weight: 179 lb (81.2 kg)  Height: 5\' 2"  (1.575 m)   Wt Readings from Last 3 Encounters:  11/02/17 179 lb (81.2 kg)  10/15/17 176 lb (79.8 kg)  09/08/17 182 lb (82.6 kg)    Labs: Lab Results  Component Value Date   NA 140 09/17/2017   K 4.0 09/17/2017   CL 100 09/17/2017   CO2 33 (H) 09/17/2017   GLUCOSE 98 09/17/2017   BUN 29 (H) 09/17/2017   CREATININE 1.11 09/17/2017   CALCIUM 9.3 09/17/2017   Lab Results  Component Value Date   INR 3.7 (A) 10/29/2017   No results found for: CHOL, HDL, LDLCALC, TRIG   GEN- The patient is well appearing, alert and oriented x 3 today.   Head- normocephalic, atraumatic Eyes-  Sclera clear, conjunctiva pink Ears- hearing intact Oropharynx- clear Neck- supple, no JVP Lymph- no cervical lymphadenopathy Lungs- Clear to ausculation bilaterally, normal work of breathing Heart- irregular  rate and rhythm, no murmurs, rubs or gallops, PMI not laterally displaced GI- soft, NT, ND, + BS Extremities- no clubbing, cyanosis, or edema MS- no significant deformity or atrophy Skin- no rash or lesion Psych- euthymic mood, full affect Neuro- strength and sensation are intact  EKG-afib at 93 bpm, qrs int 96 ms, qtc at 392 ms    Assessment and  Plan: 1. Persistent  afib Pt is asymptomatic in afib and is rate controlled She should be able to have her colonoscopy  as long as she is rate controlled and reminded her to continue her rate control meds peri procedure  Pt is aware to stop warfarin per instructed for colonoscopy Will not stop propafenone at this time as pt may opt to have cardioversion after she is back on warfarin with therapeutic INR's x 4 weeks  Will have her appointment with Dr. Caryl Comes moved up from  late  August until end of July to further discuss options.  Geroge Baseman Crixus Mcaulay, [facility] [address] [place], [place] [address] [phone]

## 2017-11-05 ENCOUNTER — Other Ambulatory Visit: Payer: Self-pay | Admitting: Internal Medicine

## 2017-11-06 ENCOUNTER — Encounter: Payer: Self-pay | Admitting: Internal Medicine

## 2017-11-10 ENCOUNTER — Ambulatory Visit (INDEPENDENT_AMBULATORY_CARE_PROVIDER_SITE_OTHER): Payer: Medicare Other | Admitting: *Deleted

## 2017-11-10 DIAGNOSIS — I480 Paroxysmal atrial fibrillation: Secondary | ICD-10-CM

## 2017-11-10 DIAGNOSIS — Z5181 Encounter for therapeutic drug level monitoring: Secondary | ICD-10-CM | POA: Diagnosis not present

## 2017-11-10 LAB — POCT INR: INR: 2.9 (ref 2.0–3.0)

## 2017-11-10 NOTE — Patient Instructions (Signed)
Description   Continue on same dosage 1 tablet daily except 1/2 tablet on Mondays, Wednesdays and Fridays.  Recheck in 3 weeks. Continue eating 3 servings of greens a week. Call with new medications or any procedures  [phone]. Was  scheduled for colonoscopy on July 9th see office note of 10/15/2017 and clearance note of 09/08/2017, will let us know when rescheduled.

## 2017-11-16 ENCOUNTER — Encounter: Payer: Self-pay | Admitting: Internal Medicine

## 2017-11-16 ENCOUNTER — Ambulatory Visit (INDEPENDENT_AMBULATORY_CARE_PROVIDER_SITE_OTHER): Payer: Medicare Other | Admitting: Internal Medicine

## 2017-11-16 ENCOUNTER — Other Ambulatory Visit: Payer: Medicare Other | Admitting: *Deleted

## 2017-11-16 VITALS — BP 126/80 | HR 105 | Ht 62.0 in | Wt 177.4 lb

## 2017-11-16 DIAGNOSIS — I480 Paroxysmal atrial fibrillation: Secondary | ICD-10-CM | POA: Diagnosis not present

## 2017-11-16 NOTE — Progress Notes (Signed)
Patient Care Team: Rochel Brome, MD as PCP - General (Family Medicine) [name], MD as PCP - Cardiology (Cardiology) [name], MD as Consulting Physician (Gastroenterology) [name], MD as Consulting Physician (Cardiology)   HPI  Sara Hancock is a 83 y.o. female seen in followup for atrial fibrillation that is paroxysmal. It occurs in the setting of background bradycardia and mild first-degree AV block. She is doing quite well without recurrent tachypalpitations.   She is tolerating her rhythmol  She has noted her HR is faster over the last 2 months 80-90s vs 60-70s  She has noted no change in functional status  NO prior cardioversion       DATE PR interval QRSduration Dose  9/15 200 98 325  11/17 200 96 325  11/18 244 104 325  2/19 204 104         Date Hgb  10/18 10.4   4/19 10.4     Past Medical History:  Diagnosis Date  . Anemia   . Arthritis   . Biliary cirrhosis ([place])   . Cholelithiasis 01/09/12  . Diverticulosis of colon with hemorrhage 06/08/2000  . Esophageal stenosis 01/08/11  . Esophagitis 01/08/11  . GERD (gastroesophageal reflux disease)   . Hiatal hernia 01/08/11  . HTN (hypertension)   . Hx of adenomatous colonic polyps 01/08/11  . Hyperplastic colon polyp 06/08/2000, 07/31/2003, 01/08/11  . Obesity   . Osteopenia   . PAF (paroxysmal atrial fibrillation) (HCC)    occurring in the setting of bradycardia and mild first degree AV block followed by Dr. Jens Som  . Pure hypercholesterolemia   . Type II or unspecified type diabetes mellitus without mention of complication, not stated as uncontrolled   . Uterine fibroid 01/09/12    Past Surgical History:  Procedure Laterality Date  . BREAST CYST EXCISION     left  . COLONOSCOPY    . INSERTION OF MESH  05/18/2012   Procedure: INSERTION OF MESH;  Surgeon: Adin Hector, MD;  Location: [facility];  Service: General;  Laterality: N/A;  . LIVER BIOPSY  05/18/2012   Procedure: LIVER  BIOPSY;  Surgeon: Adin Hector, MD;  Location: [facility];  Service: General;;  . POLYPECTOMY    . THYROIDECTOMY, PARTIAL  1980  . TUBAL LIGATION  1972  . VENTRAL HERNIA REPAIR  05/18/2012   Procedure: LAPAROSCOPIC VENTRAL HERNIA;  Surgeon: Adin Hector, MD;  Location: [facility];  Service: General;  Laterality: N/A;  Laparoscopic Ventral Wall Hernia with Mesh    Current Outpatient Medications  Medication Sig Dispense Refill  . calcium-vitamin D (OSCAL WITH D) 250-125 MG-UNIT per tablet Take 1 tablet by mouth daily.    . Ferrous Sulfate (IRON) 325 (65 FE) MG TABS Take 325 mg by mouth daily. 30 each 0  . fexofenadine (ALLEGRA) 180 MG tablet Take 180 mg by mouth daily.     . folic acid (FOLVITE) 644 MCG tablet Take 400 mcg by mouth daily.    . furosemide (LASIX) 40 MG tablet Take 80 mg by mouth every morning.     . hydrALAZINE (APRESOLINE) 50 MG tablet Take 1 tablet (50 mg total) 2 (two) times daily by mouth. 180 tablet 1  . insulin detemir (LEVEMIR FLEXPEN) 100 UNIT/ML injection Inject 25 Units into the skin as directed. 18 units at bedtime and 32 units in the a.m.    . irbesartan (AVAPRO) 300 MG tablet Take 300 mg by mouth every evening.    Marland Kitchen  ketoconazole (NIZORAL) 2 % cream Apply 1 application topically daily as needed. Around skin folds for irritation    . metFORMIN (GLUCOPHAGE) 1000 MG tablet Take 1,000 mg by mouth 2 (two) times daily with a meal.     . metoprolol (LOPRESSOR) 50 MG tablet Take 25 mg by mouth as directed. Take 1/2 tablet in the morning and 1 tablet in the evening    . Multiple Vitamin (MULTIVITAMIN WITH MINERALS) TABS Take 1 tablet by mouth daily.    . pantoprazole (PROTONIX) 40 MG tablet TAKE 1 TABLET(40 MG) BY MOUTH DAILY 90 tablet 3  . propafenone (RYTHMOL SR) 325 MG 12 hr capsule TAKE 1 CAPSULE(325 MG) BY MOUTH TWICE DAILY 180 capsule 3  . simvastatin (ZOCOR) 40 MG tablet Take 40 mg by mouth every evening.     . sitaGLIPtin (JANUVIA) 100 MG tablet Take 100 mg by mouth at  bedtime.     . ursodiol (ACTIGALL) 300 MG capsule Take 1 capsule (300 mg total) by mouth 2 (two) times daily. 180 capsule 1  . vitamin C (ASCORBIC ACID) 500 MG tablet Take 500 mg by mouth daily.    . vitamin E 400 UNIT capsule Take 400 Units by mouth daily.    Marland Kitchen warfarin (COUMADIN) 5 MG tablet TAKE AS DIRECTED BY COUMADIN CLINIC 90 tablet 1   No current facility-administered medications for this visit.     Allergies  Allergen Reactions  . Ace Inhibitors Anaphylaxis  . Capoten [Captopril] Anaphylaxis    Throat swells shut  . Neomycin-Bacitracin Zn-Polymyx Rash    Review of Systems negative except from HPI and PMH  Physical Exam BP 126/80   Pulse (!) 105   Ht 5\' 2"  (1.575 m)   Wt 177 lb 6.4 oz (80.5 kg)   SpO2 95%   BMI 32.45 kg/m  Well developed and nourished in no acute distress HENT normal Neck supple with JVP-flat Carotids brisk and full without bruits Clear Irregularly irregular rate and rhythm with controlled ventricular response, no murmurs or gallops Abd-soft with active BS without hepatomegaly No Clubbing cyanosis edema Skin-warm and dry A & Oriented  Grossly normal sensory and motor function   ECG demonstrates atrial fibrillation105 -/010/34   Assessment and  Plan  Atrial fibrillation-persistent  Hypertension  Bradypalpitations   Abnormal ECG  Anemia- chronic    The patient has persistent atrial fibrillation probably of 1-2 months duration based on her change in blood pressure machine recordings at home.  She is strikingly few symptoms; however, without having been previously cardioverted, it seems reasonable to undertake cardioversion at this time rather than consign her to rate control.  We have discussed this.  She is agreeable to proceeding.  In the event that she reverts to atrial fibrillation, will probably undertake a rate control strategy.   her INR1 week ago was 2.9  We spent more than 50% of our >25 min visit in face to face counseling  regarding the above

## 2017-11-16 NOTE — Patient Instructions (Addendum)
Medication Instructions:  Your physician recommends that you continue on your current medications as directed. Please refer to the Current Medication list given to you today.  Labwork: Your physician recommends that you return for lab work before your Cardioversion.   Testing/Procedures: Your physician has recommended that you have a Cardioversion (DCCV). Electrical Cardioversion uses a jolt of electricity to your heart either through paddles or wired patches attached to your chest. This is a controlled, usually prescheduled, procedure. Defibrillation is done under light anesthesia in the hospital, and you usually go home the day of the procedure. This is done to get your heart back into a normal rhythm. You are not awake for the procedure. Please see the instruction sheet given to you today.  Please call me tomorrow with your availability. [phone]   Follow-Up: Your physician wants you to follow-up in: One Year with Dr Caryl Comes. You will receive a reminder letter in the mail two months in advance. If you don't receive a letter, please call our office to schedule the follow-up appointment.   Any Other Special Instructions Will Be Listed Below (If Applicable).     If you need a refill on your cardiac medications before your next appointment, please call your pharmacy.

## 2017-11-16 NOTE — H&P (View-Only) (Signed)
Patient Care Team: Rochel Brome, MD as PCP - General (Family Medicine) [name], MD as PCP - Cardiology (Cardiology) [name], MD as Consulting Physician (Gastroenterology) [name], MD as Consulting Physician (Cardiology)   HPI  Sara Hancock is a 83 y.o. female seen in followup for atrial fibrillation that is paroxysmal. It occurs in the setting of background bradycardia and mild first-degree AV block. She is doing quite well without recurrent tachypalpitations.   She is tolerating her rhythmol  She has noted her HR is faster over the last 2 months 80-90s vs 60-70s  She has noted no change in functional status  NO prior cardioversion       DATE PR interval QRSduration Dose  9/15 200 98 325  11/17 200 96 325  11/18 244 104 325  2/19 204 104         Date Hgb  10/18 10.4   4/19 10.4     Past Medical History:  Diagnosis Date  . Anemia   . Arthritis   . Biliary cirrhosis ([place])   . Cholelithiasis 01/09/12  . Diverticulosis of colon with hemorrhage 06/08/2000  . Esophageal stenosis 01/08/11  . Esophagitis 01/08/11  . GERD (gastroesophageal reflux disease)   . Hiatal hernia 01/08/11  . HTN (hypertension)   . Hx of adenomatous colonic polyps 01/08/11  . Hyperplastic colon polyp 06/08/2000, 07/31/2003, 01/08/11  . Obesity   . Osteopenia   . PAF (paroxysmal atrial fibrillation) (HCC)    occurring in the setting of bradycardia and mild first degree AV block followed by Dr. Jens Som  . Pure hypercholesterolemia   . Type II or unspecified type diabetes mellitus without mention of complication, not stated as uncontrolled   . Uterine fibroid 01/09/12    Past Surgical History:  Procedure Laterality Date  . BREAST CYST EXCISION     left  . COLONOSCOPY    . INSERTION OF MESH  05/18/2012   Procedure: INSERTION OF MESH;  Surgeon: Adin Hector, MD;  [facility];  Service: General;  Laterality: N/A;  . LIVER BIOPSY  05/18/2012   Procedure: LIVER  BIOPSY;  Surgeon: Adin Hector, MD;  Location: [facility];  Service: General;;  . POLYPECTOMY    . THYROIDECTOMY, PARTIAL  1980  . TUBAL LIGATION  1972  . VENTRAL HERNIA REPAIR  05/18/2012   Procedure: LAPAROSCOPIC VENTRAL HERNIA;  Surgeon: Adin Hector, MD;  Location: [facility];  Service: General;  Laterality: N/A;  Laparoscopic Ventral Wall Hernia with Mesh    Current Outpatient Medications  Medication Sig Dispense Refill  . calcium-vitamin D (OSCAL WITH D) 250-125 MG-UNIT per tablet Take 1 tablet by mouth daily.    . Ferrous Sulfate (IRON) 325 (65 FE) MG TABS Take 325 mg by mouth daily. 30 each 0  . fexofenadine (ALLEGRA) 180 MG tablet Take 180 mg by mouth daily.     . folic acid (FOLVITE) 702 MCG tablet Take 400 mcg by mouth daily.    . furosemide (LASIX) 40 MG tablet Take 80 mg by mouth every morning.     . hydrALAZINE (APRESOLINE) 50 MG tablet Take 1 tablet (50 mg total) 2 (two) times daily by mouth. 180 tablet 1  . insulin detemir (LEVEMIR FLEXPEN) 100 UNIT/ML injection Inject 25 Units into the skin as directed. 18 units at bedtime and 32 units in the a.m.    . irbesartan (AVAPRO) 300 MG tablet Take 300 mg by mouth every evening.    Marland Kitchen  ketoconazole (NIZORAL) 2 % cream Apply 1 application topically daily as needed. Around skin folds for irritation    . metFORMIN (GLUCOPHAGE) 1000 MG tablet Take 1,000 mg by mouth 2 (two) times daily with a meal.     . metoprolol (LOPRESSOR) 50 MG tablet Take 25 mg by mouth as directed. Take 1/2 tablet in the morning and 1 tablet in the evening    . Multiple Vitamin (MULTIVITAMIN WITH MINERALS) TABS Take 1 tablet by mouth daily.    . pantoprazole (PROTONIX) 40 MG tablet TAKE 1 TABLET(40 MG) BY MOUTH DAILY 90 tablet 3  . propafenone (RYTHMOL SR) 325 MG 12 hr capsule TAKE 1 CAPSULE(325 MG) BY MOUTH TWICE DAILY 180 capsule 3  . simvastatin (ZOCOR) 40 MG tablet Take 40 mg by mouth every evening.     . sitaGLIPtin (JANUVIA) 100 MG tablet Take 100 mg by mouth at  bedtime.     . ursodiol (ACTIGALL) 300 MG capsule Take 1 capsule (300 mg total) by mouth 2 (two) times daily. 180 capsule 1  . vitamin C (ASCORBIC ACID) 500 MG tablet Take 500 mg by mouth daily.    . vitamin E 400 UNIT capsule Take 400 Units by mouth daily.    Marland Kitchen warfarin (COUMADIN) 5 MG tablet TAKE AS DIRECTED BY COUMADIN CLINIC 90 tablet 1   No current facility-administered medications for this visit.     Allergies  Allergen Reactions  . Ace Inhibitors Anaphylaxis  . Capoten [Captopril] Anaphylaxis    Throat swells shut  . Neomycin-Bacitracin Zn-Polymyx Rash    Review of Systems negative except from HPI and PMH  Physical Exam BP 126/80   Pulse (!) 105   Ht 5\' 2"  (1.575 m)   Wt 177 lb 6.4 oz (80.5 kg)   SpO2 95%   BMI 32.45 kg/m  Well developed and nourished in no acute distress HENT normal Neck supple with JVP-flat Carotids brisk and full without bruits Clear Irregularly irregular rate and rhythm with controlled ventricular response, no murmurs or gallops Abd-soft with active BS without hepatomegaly No Clubbing cyanosis edema Skin-warm and dry A & Oriented  Grossly normal sensory and motor function   ECG demonstrates atrial fibrillation105 -/010/34   Assessment and  Plan  Atrial fibrillation-persistent  Hypertension  Bradypalpitations   Abnormal ECG  Anemia- chronic    The patient has persistent atrial fibrillation probably of 1-2 months duration based on her change in blood pressure machine recordings at home.  She is strikingly few symptoms; however, without having been previously cardioverted, it seems reasonable to undertake cardioversion at this time rather than consign her to rate control.  We have discussed this.  She is agreeable to proceeding.  In the event that she reverts to atrial fibrillation, will probably undertake a rate control strategy.   her INR1 week ago was 2.9  We spent more than 50% of our >25 min visit in face to face counseling  regarding the above

## 2017-11-17 ENCOUNTER — Encounter: Payer: Medicare Other | Admitting: Gastroenterology

## 2017-11-17 LAB — CBC WITH DIFFERENTIAL/PLATELET
Basophils Absolute: 0 10*3/uL (ref 0.0–0.2)
Basos: 0 %
EOS (ABSOLUTE): 0 10*3/uL (ref 0.0–0.4)
Eos: 1 %
Hematocrit: 32.5 % — ABNORMAL LOW (ref 34.0–46.6)
Hemoglobin: 10.6 g/dL — ABNORMAL LOW (ref 11.1–15.9)
Immature Grans (Abs): 0 10*3/uL (ref 0.0–0.1)
Immature Granulocytes: 0 %
Lymphocytes Absolute: 1.5 10*3/uL (ref 0.7–3.1)
Lymphs: 23 %
MCH: 28.7 pg (ref 26.6–33.0)
MCHC: 32.6 g/dL (ref 31.5–35.7)
MCV: 88 fL (ref 79–97)
Monocytes Absolute: 0.8 10*3/uL (ref 0.1–0.9)
Monocytes: 12 %
Neutrophils Absolute: 4.3 10*3/uL (ref 1.4–7.0)
Neutrophils: 64 %
Platelets: 256 10*3/uL (ref 150–450)
RBC: 3.69 x10E6/uL — ABNORMAL LOW (ref 3.77–5.28)
RDW: 16.3 % — ABNORMAL HIGH (ref 12.3–15.4)
WBC: 6.5 10*3/uL (ref 3.4–10.8)

## 2017-11-17 LAB — BASIC METABOLIC PANEL
BUN/Creatinine Ratio: 33 — ABNORMAL HIGH (ref 12–28)
BUN: 40 mg/dL — ABNORMAL HIGH (ref 8–27)
CO2: 27 mmol/L (ref 20–29)
Calcium: 9.5 mg/dL (ref 8.7–10.3)
Chloride: 101 mmol/L (ref 96–106)
Creatinine, Ser: 1.22 mg/dL — ABNORMAL HIGH (ref 0.57–1.00)
GFR calc Af Amer: 48 mL/min/{1.73_m2} — ABNORMAL LOW (ref >59)
GFR calc non Af Amer: 42 mL/min/{1.73_m2} — ABNORMAL LOW (ref >59)
Glucose: 72 mg/dL (ref 65–99)
Potassium: 4 mmol/L (ref 3.5–5.2)
Sodium: 143 mmol/L (ref 134–144)

## 2017-11-17 LAB — PROTIME-INR
INR: 3.6 — ABNORMAL HIGH (ref 0.8–1.2)
Prothrombin Time: 34.7 s — ABNORMAL HIGH (ref 9.1–12.0)

## 2017-11-17 NOTE — Addendum Note (Signed)
Encounter addended by: Sherran Needs, NP on: 11/17/2017 4:34 PM  Actions taken: LOS modified

## 2017-11-19 ENCOUNTER — Ambulatory Visit: Payer: Medicare Other | Admitting: Physician Assistant

## 2017-11-27 ENCOUNTER — Ambulatory Visit (HOSPITAL_COMMUNITY): Payer: Medicare Other | Admitting: Certified Registered"

## 2017-11-27 ENCOUNTER — Ambulatory Visit (HOSPITAL_COMMUNITY)
Admission: RE | Admit: 2017-11-27 | Discharge: 2017-11-27 | Disposition: A | Discharge status: Home / Self Care | Payer: Medicare Other | Source: Ambulatory Visit | Attending: Cardiovascular Disease | Admitting: Cardiovascular Disease

## 2017-11-27 ENCOUNTER — Encounter (HOSPITAL_COMMUNITY): Payer: Self-pay | Admitting: *Deleted

## 2017-11-27 ENCOUNTER — Encounter (HOSPITAL_COMMUNITY)
Admission: RE | Disposition: A | Discharge status: Home / Self Care | Payer: Self-pay | Source: Ambulatory Visit | Attending: Cardiovascular Disease

## 2017-11-27 DIAGNOSIS — D6489 Other specified anemias: Secondary | ICD-10-CM | POA: Insufficient documentation

## 2017-11-27 DIAGNOSIS — Z7901 Long term (current) use of anticoagulants: Secondary | ICD-10-CM | POA: Diagnosis not present

## 2017-11-27 DIAGNOSIS — I10 Essential (primary) hypertension: Secondary | ICD-10-CM | POA: Insufficient documentation

## 2017-11-27 DIAGNOSIS — Z794 Long term (current) use of insulin: Secondary | ICD-10-CM | POA: Insufficient documentation

## 2017-11-27 DIAGNOSIS — M1990 Unspecified osteoarthritis, unspecified site: Secondary | ICD-10-CM | POA: Diagnosis not present

## 2017-11-27 DIAGNOSIS — M8580 Other specified disorders of bone density and structure, unspecified site: Secondary | ICD-10-CM | POA: Diagnosis not present

## 2017-11-27 DIAGNOSIS — I480 Paroxysmal atrial fibrillation: Secondary | ICD-10-CM

## 2017-11-27 DIAGNOSIS — I481 Persistent atrial fibrillation: Secondary | ICD-10-CM | POA: Insufficient documentation

## 2017-11-27 DIAGNOSIS — K219 Gastro-esophageal reflux disease without esophagitis: Secondary | ICD-10-CM | POA: Diagnosis not present

## 2017-11-27 DIAGNOSIS — I4891 Unspecified atrial fibrillation: Secondary | ICD-10-CM | POA: Diagnosis not present

## 2017-11-27 DIAGNOSIS — E7800 Pure hypercholesterolemia, unspecified: Secondary | ICD-10-CM | POA: Insufficient documentation

## 2017-11-27 DIAGNOSIS — E669 Obesity, unspecified: Secondary | ICD-10-CM | POA: Diagnosis not present

## 2017-11-27 DIAGNOSIS — I4819 Other persistent atrial fibrillation: Secondary | ICD-10-CM

## 2017-11-27 DIAGNOSIS — E119 Type 2 diabetes mellitus without complications: Secondary | ICD-10-CM | POA: Diagnosis not present

## 2017-11-27 DIAGNOSIS — R9431 Abnormal electrocardiogram [ECG] [EKG]: Secondary | ICD-10-CM | POA: Diagnosis not present

## 2017-11-27 DIAGNOSIS — I440 Atrioventricular block, first degree: Secondary | ICD-10-CM | POA: Insufficient documentation

## 2017-11-27 DIAGNOSIS — Z6832 Body mass index (BMI) 32.0-32.9, adult: Secondary | ICD-10-CM | POA: Insufficient documentation

## 2017-11-27 HISTORY — PX: CARDIOVERSION: SHX1299

## 2017-11-27 LAB — GLUCOSE, CAPILLARY: Glucose-Capillary: 99 mg/dL (ref 70–99)

## 2017-11-27 SURGERY — CARDIOVERSION
Anesthesia: General

## 2017-11-27 MED ORDER — PROPOFOL 10 MG/ML IV BOLUS
INTRAVENOUS | Status: DC | PRN
  Administered 2017-11-27: 45 mg via INTRAVENOUS

## 2017-11-27 MED ORDER — LIDOCAINE 2% (20 MG/ML) 5 ML SYRINGE
INTRAMUSCULAR | Status: DC | PRN
  Administered 2017-11-27: 80 mg via INTRAVENOUS

## 2017-11-27 MED ORDER — SODIUM CHLORIDE 0.9 % IV SOLN
INTRAVENOUS | Status: DC
  Administered 2017-11-27: 12:00:00 via INTRAVENOUS

## 2017-11-27 NOTE — Anesthesia Procedure Notes (Signed)
Procedure Name: General with mask airway Date/Time: 11/27/2017 12:34 PM Performed by: Imagene Riches, CRNA Pre-anesthesia Checklist: Patient identified, Emergency Drugs available, Suction available and Patient being monitored Patient Re-evaluated:Patient Re-evaluated prior to induction Oxygen Delivery Method: Ambu bag Preoxygenation: Pre-oxygenation with 100% oxygen Induction Type: IV induction

## 2017-11-27 NOTE — Anesthesia Preprocedure Evaluation (Signed)
Anesthesia Evaluation  Patient identified by MRN, date of birth, ID band Patient awake    Reviewed: Allergy & Precautions, NPO status , Patient's Chart, lab work & pertinent test results  Airway Mallampati: II  TM Distance: >3 FB Neck ROM: Full    Dental no notable dental hx.    Pulmonary neg pulmonary ROS,    Pulmonary exam normal breath sounds clear to auscultation       Cardiovascular hypertension, Pt. on medications + dysrhythmias Atrial Fibrillation  Rhythm:Irregular Rate:Normal     Neuro/Psych negative neurological ROS  negative psych ROS   GI/Hepatic Neg liver ROS, hiatal hernia, PUD, GERD  ,  Endo/Other  negative endocrine ROSdiabetes, Type 2  Renal/GU negative Renal ROS  negative genitourinary   Musculoskeletal negative musculoskeletal ROS (+)   Abdominal   Peds negative pediatric ROS (+)  Hematology negative hematology ROS (+)   Anesthesia Other Findings   Reproductive/Obstetrics negative OB ROS                             Anesthesia Physical Anesthesia Plan  ASA: III  Anesthesia Plan: General   Post-op Pain Management:    Induction: Intravenous  PONV Risk Score and Plan: 3 and Treatment may vary due to age or medical condition  Airway Management Planned: Mask  Additional Equipment:   Intra-op Plan:   Post-operative Plan:   Informed Consent: I have reviewed the patients History and Physical, chart, labs and discussed the procedure including the risks, benefits and alternatives for the proposed anesthesia with the patient or authorized representative who has indicated his/her understanding and acceptance.   Dental advisory given  Plan Discussed with: CRNA  Anesthesia Plan Comments:         Anesthesia Quick Evaluation

## 2017-11-27 NOTE — Op Note (Signed)
Procedure: Electrical Cardioversion Indications:  Atrial Fibrillation  Procedure Details:  Consent: Risks of procedure as well as the alternatives and risks of each were explained to the (patient/caregiver).  Consent for procedure obtained.  Time Out: Verified patient identification, verified procedure, site/side was marked, verified correct patient position, special equipment/implants available, medications/allergies/relevent history reviewed, required imaging and test results available.  Performed  Patient placed on cardiac monitor, pulse oximetry, supplemental oxygen as necessary.  Sedation given: IV propofol 45 mg, Dr. Marcell Barlow Pacer pads placed anterior and posterior chest.  Cardioverted 1 time(s).  Cardioversion with synchronized biphasic 120J shock.  Evaluation: Findings: Post procedure EKG shows: NSR Complications: None Patient did tolerate procedure well.  Time Spent Directly with the Patient:  30 minutes   Rosealyn Little 11/27/2017, 12:45 PM

## 2017-11-27 NOTE — Anesthesia Postprocedure Evaluation (Signed)
Anesthesia Post Note  Patient: Sara Hancock  Procedure(s) Performed: CARDIOVERSION (N/A )     Patient location during evaluation: Endoscopy Anesthesia Type: General Level of consciousness: awake and alert Pain management: pain level controlled Vital Signs Assessment: post-procedure vital signs reviewed and stable Respiratory status: spontaneous breathing, nonlabored ventilation, respiratory function stable and patient connected to nasal cannula oxygen Cardiovascular status: blood pressure returned to baseline and stable Postop Assessment: no apparent nausea or vomiting Anesthetic complications: no    Last Vitals:  Vitals:   11/27/17 1043 11/27/17 1249  BP: 135/89 (!) 95/52  Pulse: 90 61  Resp: 16 16  Temp: 36.6 C   SpO2: 96% 95%    Last Pain:  Vitals:   11/27/17 1249  TempSrc:   PainSc: 0-No pain                 Montez Hageman

## 2017-11-27 NOTE — Discharge Instructions (Signed)
Electrical Cardioversion, Care After °This sheet gives you information about how to care for yourself after your procedure. Your health care provider may also give you more specific instructions. If you have problems or questions, contact your health care provider. °What can I expect after the procedure? °After the procedure, it is common to have: °· Some redness on the skin where the shocks were given. ° °Follow these instructions at home: °· Do not drive for 24 hours if you were given a medicine to help you relax (sedative). °· Take over-the-counter and prescription medicines only as told by your health care provider. °· Ask your health care provider how to check your pulse. Check it often. °· Rest for 48 hours after the procedure or as told by your health care provider. °· Avoid or limit your caffeine use as told by your health care provider. °Contact a health care provider if: °· You feel like your heart is beating too quickly or your pulse is not regular. °· You have a serious muscle cramp that does not go away. °Get help right away if: °· You have discomfort in your chest. °· You are dizzy or you feel faint. °· You have trouble breathing or you are short of breath. °· Your speech is slurred. °· You have trouble moving an arm or leg on one side of your body. °· Your fingers or toes turn cold or blue. °This information is not intended to replace advice given to you by your health care provider. Make sure you discuss any questions you have with your health care provider. °Document Released: 02/16/2013 Document Revised: 11/30/2015 Document Reviewed: 11/02/2015 °Elsevier Interactive Patient Education © 2018 [employer] ° °

## 2017-11-27 NOTE — Interval H&P Note (Signed)
History and Physical Interval Note:  11/27/2017 10:49 AM  Sara Hancock  has presented today for surgery, with the diagnosis of afib  The various methods of treatment have been discussed with the patient and family. After consideration of risks, benefits and other options for treatment, the patient has consented to  Procedure(s): CARDIOVERSION (N/A) as a surgical intervention .  The patient's history has been reviewed, patient examined, no change in status, stable for surgery.  I have reviewed the patient's chart and labs.  Questions were answered to the patient's satisfaction.     [name]

## 2017-11-27 NOTE — Transfer of Care (Signed)
Immediate Anesthesia Transfer of Care Note  Patient: Sara Hancock  Procedure(s) Performed: CARDIOVERSION (N/A )  Patient Location: Endoscopy Unit  Anesthesia Type:General  Level of Consciousness: drowsy  Airway & Oxygen Therapy: Patient Spontanous Breathing  Post-op Assessment: Report given to RN and Post -op Vital signs reviewed and stable  Post vital signs: Reviewed and stable  Last Vitals:  Vitals Value Taken Time  BP    Temp    Pulse    Resp    SpO2      Last Pain:  Vitals:   11/27/17 1043  TempSrc: Oral  PainSc: 0-No pain         Complications: No apparent anesthesia complications

## 2017-11-30 ENCOUNTER — Telehealth: Payer: Self-pay

## 2017-11-30 ENCOUNTER — Encounter (HOSPITAL_COMMUNITY): Payer: Self-pay | Admitting: Cardiovascular Disease

## 2017-11-30 DIAGNOSIS — K743 Primary biliary cirrhosis: Secondary | ICD-10-CM

## 2017-11-30 NOTE — Telephone Encounter (Signed)
-----   Message from Marlon Pel, RN sent at 06/02/2017  9:32 AM EST ----- Needs [place] see results 1/22

## 2017-11-30 NOTE — Telephone Encounter (Signed)
[place] scheudled for 12/03/17 8:45 arrival for 9:00.  Patient notified to be NPO after midnight.

## 2017-12-01 ENCOUNTER — Ambulatory Visit: Payer: Medicare Other | Admitting: Internal Medicine

## 2017-12-01 DIAGNOSIS — Z7901 Long term (current) use of anticoagulants: Secondary | ICD-10-CM | POA: Diagnosis not present

## 2017-12-01 DIAGNOSIS — M62838 Other muscle spasm: Secondary | ICD-10-CM | POA: Diagnosis not present

## 2017-12-01 DIAGNOSIS — I4891 Unspecified atrial fibrillation: Secondary | ICD-10-CM | POA: Diagnosis not present

## 2017-12-01 DIAGNOSIS — M25512 Pain in left shoulder: Secondary | ICD-10-CM | POA: Diagnosis not present

## 2017-12-01 DIAGNOSIS — N289 Disorder of kidney and ureter, unspecified: Secondary | ICD-10-CM | POA: Diagnosis not present

## 2017-12-01 DIAGNOSIS — E119 Type 2 diabetes mellitus without complications: Secondary | ICD-10-CM | POA: Diagnosis not present

## 2017-12-01 DIAGNOSIS — R079 Chest pain, unspecified: Secondary | ICD-10-CM | POA: Diagnosis not present

## 2017-12-01 DIAGNOSIS — N19 Unspecified kidney failure: Secondary | ICD-10-CM | POA: Diagnosis not present

## 2017-12-01 DIAGNOSIS — R791 Abnormal coagulation profile: Secondary | ICD-10-CM | POA: Diagnosis not present

## 2017-12-02 DIAGNOSIS — E1169 Type 2 diabetes mellitus with other specified complication: Secondary | ICD-10-CM | POA: Diagnosis not present

## 2017-12-02 DIAGNOSIS — M542 Cervicalgia: Secondary | ICD-10-CM | POA: Diagnosis not present

## 2017-12-03 ENCOUNTER — Ambulatory Visit (HOSPITAL_COMMUNITY)
Admission: RE | Admit: 2017-12-03 | Discharge: 2017-12-03 | Disposition: A | Discharge status: Home / Self Care | Payer: Medicare Other | Source: Ambulatory Visit | Attending: Gastroenterology | Admitting: Gastroenterology

## 2017-12-03 ENCOUNTER — Ambulatory Visit (INDEPENDENT_AMBULATORY_CARE_PROVIDER_SITE_OTHER): Payer: Medicare Other | Admitting: *Deleted

## 2017-12-03 DIAGNOSIS — K8020 Calculus of gallbladder without cholecystitis without obstruction: Secondary | ICD-10-CM | POA: Diagnosis not present

## 2017-12-03 DIAGNOSIS — K743 Primary biliary cirrhosis: Secondary | ICD-10-CM | POA: Insufficient documentation

## 2017-12-03 DIAGNOSIS — Z5181 Encounter for therapeutic drug level monitoring: Secondary | ICD-10-CM

## 2017-12-03 DIAGNOSIS — K7460 Unspecified cirrhosis of liver: Secondary | ICD-10-CM | POA: Diagnosis not present

## 2017-12-03 DIAGNOSIS — I480 Paroxysmal atrial fibrillation: Secondary | ICD-10-CM | POA: Diagnosis not present

## 2017-12-03 LAB — POCT INR: INR: 1.9 — AB (ref 2.0–3.0)

## 2017-12-03 NOTE — Patient Instructions (Signed)
Description   Today take 1.5 tablets, then change your dose to 1/2 tablet daily except 1 tablet on Tuesdays, Thursdays and Saturdays.   Recheck in 2 weeks. Continue eating 3 servings of greens a week. Call with new medications or any procedures  [phone]. Was  scheduled for colonoscopy on July 9th see office note of 10/15/2017 and clearance note of 09/08/2017, will let us know when rescheduled.

## 2017-12-10 DIAGNOSIS — E782 Mixed hyperlipidemia: Secondary | ICD-10-CM | POA: Diagnosis not present

## 2017-12-10 DIAGNOSIS — I10 Essential (primary) hypertension: Secondary | ICD-10-CM | POA: Diagnosis not present

## 2017-12-10 DIAGNOSIS — E119 Type 2 diabetes mellitus without complications: Secondary | ICD-10-CM | POA: Diagnosis not present

## 2017-12-10 DIAGNOSIS — N182 Chronic kidney disease, stage 2 (mild): Secondary | ICD-10-CM | POA: Diagnosis not present

## 2017-12-17 ENCOUNTER — Ambulatory Visit (INDEPENDENT_AMBULATORY_CARE_PROVIDER_SITE_OTHER): Payer: Medicare Other | Admitting: *Deleted

## 2017-12-17 DIAGNOSIS — K743 Primary biliary cirrhosis: Secondary | ICD-10-CM | POA: Diagnosis not present

## 2017-12-17 DIAGNOSIS — Z5181 Encounter for therapeutic drug level monitoring: Secondary | ICD-10-CM

## 2017-12-17 DIAGNOSIS — I480 Paroxysmal atrial fibrillation: Secondary | ICD-10-CM | POA: Diagnosis not present

## 2017-12-17 DIAGNOSIS — N183 Chronic kidney disease, stage 3 (moderate): Secondary | ICD-10-CM | POA: Diagnosis not present

## 2017-12-17 DIAGNOSIS — D508 Other iron deficiency anemias: Secondary | ICD-10-CM | POA: Diagnosis not present

## 2017-12-17 DIAGNOSIS — N182 Chronic kidney disease, stage 2 (mild): Secondary | ICD-10-CM | POA: Diagnosis not present

## 2017-12-17 DIAGNOSIS — E782 Mixed hyperlipidemia: Secondary | ICD-10-CM | POA: Diagnosis not present

## 2017-12-17 DIAGNOSIS — M546 Pain in thoracic spine: Secondary | ICD-10-CM | POA: Diagnosis not present

## 2017-12-17 DIAGNOSIS — R600 Localized edema: Secondary | ICD-10-CM | POA: Diagnosis not present

## 2017-12-17 DIAGNOSIS — Z6833 Body mass index (BMI) 33.0-33.9, adult: Secondary | ICD-10-CM | POA: Diagnosis not present

## 2017-12-17 DIAGNOSIS — I10 Essential (primary) hypertension: Secondary | ICD-10-CM | POA: Diagnosis not present

## 2017-12-17 DIAGNOSIS — E668 Other obesity: Secondary | ICD-10-CM | POA: Diagnosis not present

## 2017-12-17 DIAGNOSIS — E1122 Type 2 diabetes mellitus with diabetic chronic kidney disease: Secondary | ICD-10-CM | POA: Diagnosis not present

## 2017-12-17 LAB — POCT INR: INR: 2.8 (ref 2.0–3.0)

## 2017-12-17 NOTE — Patient Instructions (Addendum)
Description   Start taking 1/2 tablet daily except 1 tablet on Tuesdays and Saturdays. Recheck in 2 weeks. Continue eating 3 servings of greens a week now going to eat 1-2 a week.  Call with new medications or any procedures  [phone]. Was  scheduled for colonoscopy on July 9th see office note of 10/15/2017 and clearance note of 09/08/2017, will let us know when rescheduled.

## 2017-12-23 DIAGNOSIS — I10 Essential (primary) hypertension: Secondary | ICD-10-CM | POA: Diagnosis not present

## 2017-12-24 ENCOUNTER — Telehealth: Payer: Self-pay | Admitting: Internal Medicine

## 2017-12-24 ENCOUNTER — Encounter: Payer: Self-pay | Admitting: Internal Medicine

## 2017-12-24 ENCOUNTER — Ambulatory Visit (INDEPENDENT_AMBULATORY_CARE_PROVIDER_SITE_OTHER): Payer: Medicare Other | Admitting: Internal Medicine

## 2017-12-24 VITALS — BP 144/90 | HR 96 | Ht 62.0 in | Wt 177.0 lb

## 2017-12-24 DIAGNOSIS — I480 Paroxysmal atrial fibrillation: Secondary | ICD-10-CM | POA: Diagnosis not present

## 2017-12-24 DIAGNOSIS — I481 Persistent atrial fibrillation: Secondary | ICD-10-CM | POA: Diagnosis not present

## 2017-12-24 DIAGNOSIS — Z79899 Other long term (current) drug therapy: Secondary | ICD-10-CM

## 2017-12-24 DIAGNOSIS — I4819 Other persistent atrial fibrillation: Secondary | ICD-10-CM

## 2017-12-24 MED ORDER — METOPROLOL TARTRATE 75 MG PO TABS: 75.0000 mg | ORAL_TABLET | ORAL | 3 refills | Status: DC

## 2017-12-24 NOTE — Patient Instructions (Addendum)
Medication Instructions:  Your physician has recommended you make the following change in your medication:   1. Increase your Metoprolol Tartrate to 75mg , 1 1/2 tablet, two times per day 2. Stop your Rhythmol  Labwork: None ordered.  Testing/Procedures: None ordered.  Follow-Up: Your physician wants you to follow-up in: 6 months with Tommye Standard, PA. You will receive a reminder letter in the mail two months in advance. If you don't receive a letter, please call our office to schedule the follow-up appointment.   Any Other Special Instructions Will Be Listed Below (If Applicable).     If you need a refill on your cardiac medications before your next appointment, please call your pharmacy.

## 2017-12-24 NOTE — Telephone Encounter (Signed)
Dr Caryl Comes has never filled this for the patient. Just want to clarify okay to order as patient was seen today but medication wasn't ordered. Please advise. Thanks, MI

## 2017-12-24 NOTE — Progress Notes (Signed)
Patient Care Team: Rochel Brome, MD as PCP - General (Family Medicine) [name], MD as PCP - Cardiology (Cardiology) [name], MD as Consulting Physician (Gastroenterology) [name], MD as Consulting Physician (Cardiology)   HPI  Sara Hancock is a 83 y.o. female seen in followup for atrial fibrillation that is paroxysmal. It occurs in the setting of background bradycardia and mild first-degree AV block. She is doing quite well without recurrent tachypalpitations.   She is tolerating her rhythmol  She has noted her HR is faster over the last 2 months 80-90s vs 60-70s  She has noted no change in functional status  NO prior cardioversion       DATE PR interval QRSduration Dose  9/15 200 98 325  11/17 200 96 325  11/18 244 104 325  2/19 204 104         Date Hgb  10/18 10.4   4/19 10.4     Past Medical History:  Diagnosis Date  . Anemia   . Arthritis   . Biliary cirrhosis ([place])   . Cholelithiasis 01/09/12  . Diverticulosis of colon with hemorrhage 06/08/2000  . Esophageal stenosis 01/08/11  . Esophagitis 01/08/11  . GERD (gastroesophageal reflux disease)   . Hiatal hernia 01/08/11  . HTN (hypertension)   . Hx of adenomatous colonic polyps 01/08/11  . Hyperplastic colon polyp 06/08/2000, 07/31/2003, 01/08/11  . Obesity   . Osteopenia   . PAF (paroxysmal atrial fibrillation) ([facility])    occurring in the setting of bradycardia and mild first degree AV block followed by Dr. Jens Som  . Pure hypercholesterolemia   . Type II or unspecified type diabetes mellitus without mention of complication, not stated as uncontrolled   . Uterine fibroid 01/09/12    Past Surgical History:  Procedure Laterality Date  . BREAST CYST EXCISION     left  . CARDIOVERSION N/A 11/27/2017   Procedure: CARDIOVERSION;  Surgeon: Sanda Renard Caperton, MD;  Location: [facility];  Service: Cardiovascular;  Laterality: N/A;  . COLONOSCOPY    . INSERTION OF MESH  05/18/2012    Procedure: INSERTION OF MESH;  Surgeon: Adin Hector, MD;  Location: [facility];  Service: General;  Laterality: N/A;  . LIVER BIOPSY  05/18/2012   Procedure: LIVER BIOPSY;  Surgeon: Adin Hector, MD;  Location: [facility];  Service: General;;  . POLYPECTOMY    . THYROIDECTOMY, PARTIAL  1980  . TUBAL LIGATION  1972  . VENTRAL HERNIA REPAIR  05/18/2012   Procedure: LAPAROSCOPIC VENTRAL HERNIA;  Surgeon: Adin Hector, MD;  Location: [facility];  Service: General;  Laterality: N/A;  Laparoscopic Ventral Wall Hernia with Mesh    Current Outpatient Medications  Medication Sig Dispense Refill  . calcium-vitamin D (OSCAL WITH D) 250-125 MG-UNIT per tablet Take 1 tablet by mouth daily.    . Cholecalciferol (VITAMIN D3) 1000 units CAPS Take 1,000 Units by mouth daily.    . Ferrous Sulfate (IRON) 325 (65 FE) MG TABS Take 325 mg by mouth daily. 30 each 0  . fexofenadine (ALLEGRA) 180 MG tablet Take 180 mg by mouth daily.     . folic acid (FOLVITE) 720 MCG tablet Take 400 mcg by mouth daily.    . furosemide (LASIX) 40 MG tablet Take 1 tablet by mouth 2 (two) times daily.    . hydrALAZINE (APRESOLINE) 50 MG tablet Take 1 tablet (50 mg total) 2 (two) times daily by mouth. 180 tablet 1  . insulin detemir (LEVEMIR  FLEXPEN) 100 UNIT/ML injection Inject 25 Units into the skin as directed. 18 units at bedtime and 32 units in the a.m.    . irbesartan (AVAPRO) 300 MG tablet Take 300 mg by mouth every evening.    Marland Kitchen ketoconazole (NIZORAL) 2 % cream Apply 1 application topically daily as needed. Around skin folds for irritation    . metFORMIN (GLUCOPHAGE) 1000 MG tablet Take 1,000 mg by mouth 2 (two) times daily with a meal.     . metoprolol (LOPRESSOR) 50 MG tablet Take 25 mg by mouth as directed. Take 25mg  in the morning and 50mg  in the evening    . Multiple Vitamin (MULTIVITAMIN WITH MINERALS) TABS Take 1 tablet by mouth daily.    . pantoprazole (PROTONIX) 40 MG tablet TAKE 1 TABLET(40 MG) BY MOUTH DAILY 90 tablet 3   . propafenone (RYTHMOL SR) 325 MG 12 hr capsule TAKE 1 CAPSULE(325 MG) BY MOUTH TWICE DAILY 180 capsule 3  . simvastatin (ZOCOR) 40 MG tablet Take 40 mg by mouth every evening.     . sitaGLIPtin (JANUVIA) 100 MG tablet Take 100 mg by mouth at bedtime.     . ursodiol (ACTIGALL) 300 MG capsule Take 1 capsule (300 mg total) by mouth 2 (two) times daily. 180 capsule 1  . vitamin B-12 (CYANOCOBALAMIN) 1000 MCG tablet Take 1,000 mcg by mouth daily.    . vitamin C (ASCORBIC ACID) 500 MG tablet Take 500 mg by mouth daily.    . vitamin E 400 UNIT capsule Take 400 Units by mouth daily.    Marland Kitchen warfarin (COUMADIN) 5 MG tablet TAKE AS DIRECTED BY COUMADIN CLINIC (Patient taking differently: TAKE 2.5MG  BY MOUTH ON MONDAY, WEDNESDAY AND FRIDAY, AND 5MG  TUESDAY, THURSDAY, SATURDAY AND SUNDAY) 90 tablet 1   No current facility-administered medications for this visit.     Allergies  Allergen Reactions  . Ace Inhibitors Anaphylaxis  . Capoten [Captopril] Anaphylaxis    Throat swells shut  . Neomycin-Bacitracin Zn-Polymyx Rash    Review of Systems negative except from HPI and PMH  Physical Exam BP (!) 144/90   Pulse 96   Ht 5\' 2"  (1.575 m)   Wt 177 lb (80.3 kg)   SpO2 93%   BMI 32.37 kg/m  Well developed and nourished in no acute distress HENT normal Neck supple with JVP-flat Carotids brisk and full without bruits Clear Irregularly irregular rate and rhythm with controlled ventricular response, no murmurs or gallops Abd-soft with active BS without hepatomegaly No Clubbing cyanosis edema Skin-warm and dry A & Oriented  Grossly normal sensory and motor function    ECG demonstrates atrial fibrillation  -/10/36  Assessment and  Plan  Atrial fibrillation-persistent  Hypertension  Abnormal ECG  Anemia- chronic   wil continue rate control   On Anticoagulation;  No bleeding issues   Will stop propafenone; will increase metoprolol 25/50>>75 bid  If BP < 120 with the adjustment, will  decrease apresoline 50>>25 bid

## 2017-12-24 NOTE — Telephone Encounter (Signed)
New Message        *STAT* If patient is at the pharmacy, call can be transferred to refill team.   1. Which medications need to be refilled? (please list name of each medication and dose if known) Metoprolol 75 mg  2. Which pharmacy/location (including street and city if local pharmacy) is medication to be sent to? [facility] [employer], [place]  3. Do they need a 30 day or 90 day supply? 90     Patient needs a new Rx 75 mg

## 2017-12-28 ENCOUNTER — Other Ambulatory Visit: Payer: Self-pay | Admitting: Family Medicine

## 2017-12-28 DIAGNOSIS — Z1231 Encounter for screening mammogram for malignant neoplasm of breast: Secondary | ICD-10-CM

## 2017-12-29 ENCOUNTER — Telehealth: Payer: Self-pay | Admitting: Internal Medicine

## 2017-12-29 NOTE — Telephone Encounter (Signed)
New Message    Patient calling because she states that she was previously cleared to have a colonoscopy but it had to be cancelled because she needed a cardioversion. She is wanting to know can she go ahead and have the colonoscopy now. Please call.

## 2017-12-29 NOTE — Telephone Encounter (Signed)
LVM regarding her surgical clearance. I advised pt to have her GI dr send [place] in a new surgical clearance form. There may be new recommendations now since she has been cardioverted and she remains in Afib.

## 2017-12-31 ENCOUNTER — Ambulatory Visit (INDEPENDENT_AMBULATORY_CARE_PROVIDER_SITE_OTHER): Payer: Medicare Other | Admitting: *Deleted

## 2017-12-31 DIAGNOSIS — I480 Paroxysmal atrial fibrillation: Secondary | ICD-10-CM | POA: Diagnosis not present

## 2017-12-31 DIAGNOSIS — Z5181 Encounter for therapeutic drug level monitoring: Secondary | ICD-10-CM

## 2017-12-31 LAB — POCT INR: INR: 2.4 (ref 2.0–3.0)

## 2017-12-31 NOTE — Patient Instructions (Signed)
Description   Continue taking 1/2 tablet daily except 1 tablet on Tuesdays and Saturdays. Recheck in 3 weeks. Continue eating 3 servings of greens a week now going to eat 1-2 a week.  Call with new medications or any procedures  [phone]. Was  scheduled for colonoscopy on July 9th see office note of 10/15/2017 and clearance note of 09/08/2017, will let us know when rescheduled.

## 2018-01-06 ENCOUNTER — Ambulatory Visit: Payer: Medicare Other | Admitting: Internal Medicine

## 2018-01-07 ENCOUNTER — Ambulatory Visit: Payer: Medicare Other | Admitting: Internal Medicine

## 2018-01-21 ENCOUNTER — Ambulatory Visit (INDEPENDENT_AMBULATORY_CARE_PROVIDER_SITE_OTHER): Payer: Medicare Other

## 2018-01-21 DIAGNOSIS — I480 Paroxysmal atrial fibrillation: Secondary | ICD-10-CM

## 2018-01-21 DIAGNOSIS — E668 Other obesity: Secondary | ICD-10-CM | POA: Diagnosis not present

## 2018-01-21 DIAGNOSIS — Z0000 Encounter for general adult medical examination without abnormal findings: Secondary | ICD-10-CM | POA: Diagnosis not present

## 2018-01-21 DIAGNOSIS — Z6834 Body mass index (BMI) 34.0-34.9, adult: Secondary | ICD-10-CM | POA: Diagnosis not present

## 2018-01-21 DIAGNOSIS — Z5181 Encounter for therapeutic drug level monitoring: Secondary | ICD-10-CM | POA: Diagnosis not present

## 2018-01-21 LAB — POCT INR: INR: 2.4 (ref 2.0–3.0)

## 2018-01-21 NOTE — Patient Instructions (Signed)
Description   Continue on same dosage 1/2 tablet daily except 1 tablet on Tuesdays and Saturdays. Recheck in 4 weeks.  Call with new medications or any procedures  [phone].

## 2018-02-05 DIAGNOSIS — Z23 Encounter for immunization: Secondary | ICD-10-CM | POA: Diagnosis not present

## 2018-02-18 ENCOUNTER — Other Ambulatory Visit (INDEPENDENT_AMBULATORY_CARE_PROVIDER_SITE_OTHER): Payer: Medicare Other

## 2018-02-18 ENCOUNTER — Ambulatory Visit (INDEPENDENT_AMBULATORY_CARE_PROVIDER_SITE_OTHER): Payer: Medicare Other | Admitting: Gastroenterology

## 2018-02-18 ENCOUNTER — Encounter: Payer: Self-pay | Admitting: Gastroenterology

## 2018-02-18 ENCOUNTER — Ambulatory Visit (INDEPENDENT_AMBULATORY_CARE_PROVIDER_SITE_OTHER): Payer: Medicare Other | Admitting: *Deleted

## 2018-02-18 ENCOUNTER — Ambulatory Visit
Admission: RE | Admit: 2018-02-18 | Discharge: 2018-02-18 | Disposition: A | Discharge status: Home / Self Care | Payer: Medicare Other | Source: Ambulatory Visit | Attending: Family Medicine | Admitting: Family Medicine

## 2018-02-18 ENCOUNTER — Telehealth: Payer: Self-pay

## 2018-02-18 VITALS — BP 110/70 | HR 80 | Ht 62.0 in | Wt 178.5 lb

## 2018-02-18 DIAGNOSIS — K2100 Gastro-esophageal reflux disease with esophagitis, without bleeding: Secondary | ICD-10-CM

## 2018-02-18 DIAGNOSIS — K743 Primary biliary cirrhosis: Secondary | ICD-10-CM

## 2018-02-18 DIAGNOSIS — Z1231 Encounter for screening mammogram for malignant neoplasm of breast: Secondary | ICD-10-CM

## 2018-02-18 DIAGNOSIS — I480 Paroxysmal atrial fibrillation: Secondary | ICD-10-CM | POA: Diagnosis not present

## 2018-02-18 DIAGNOSIS — Z7901 Long term (current) use of anticoagulants: Secondary | ICD-10-CM | POA: Diagnosis not present

## 2018-02-18 DIAGNOSIS — Z5181 Encounter for therapeutic drug level monitoring: Secondary | ICD-10-CM | POA: Diagnosis not present

## 2018-02-18 DIAGNOSIS — Z86010 Personal history of colonic polyps: Secondary | ICD-10-CM | POA: Diagnosis not present

## 2018-02-18 LAB — HEPATIC FUNCTION PANEL
ALT: 21 U/L (ref 0–35)
AST: 24 U/L (ref 0–37)
Albumin: 3.8 g/dL (ref 3.5–5.2)
Alkaline Phosphatase: 109 U/L (ref 39–117)
Bilirubin, Direct: 0.2 mg/dL (ref 0.0–0.3)
Total Bilirubin: 0.5 mg/dL (ref 0.2–1.2)
Total Protein: 7.5 g/dL (ref 6.0–8.3)

## 2018-02-18 LAB — POCT INR: INR: 2.1 (ref 2.0–3.0)

## 2018-02-18 MED ORDER — PANTOPRAZOLE SODIUM 40 MG PO TBEC: DELAYED_RELEASE_TABLET | ORAL | 3 refills | Status: DC

## 2018-02-18 MED ORDER — NA SULFATE-K SULFATE-MG SULF 17.5-3.13-1.6 GM/177ML PO SOLN: 1.0000 | Freq: Once | ORAL | 0 refills | Status: AC

## 2018-02-18 MED ORDER — URSODIOL 300 MG PO CAPS: 300.0000 mg | ORAL_CAPSULE | Freq: Two times a day (BID) | ORAL | 1 refills | Status: DC

## 2018-02-18 NOTE — Patient Instructions (Signed)
Description   Continue on same dosage 1/2 tablet daily except 1 tablet on Tuesdays and Saturdays. Recheck in 4 weeks.  Call with new medications or any procedures  [phone].

## 2018-02-18 NOTE — Telephone Encounter (Signed)
Pharm please address coumadin for colonoscopy, note states brilinta but I do not see that she is on Brilinta.  Thanks.

## 2018-02-18 NOTE — Telephone Encounter (Signed)
Patient with diagnosis of Afib on warfarin for anticoagulation.    Procedure: colonoscopy Date of procedure: 03/23/18  CHADS2-VASc score of  5 (CHF, HTN, AGE, DM2, stroke/tia x 2, CAD, AGE, female)  Per office protocol, patient can hold warfarin for 5 days prior to procedure.   Patient will not need bridging with Lovenox (enoxaparin) around procedure.

## 2018-02-18 NOTE — Progress Notes (Signed)
    History of Present Illness: This is an 83 year old female with PBC, steatohepatitis.  She was diagnosed with anemia and started on iron per her PCP.  Office notes and lab reports are not available today.  She states her hemoglobin has improved on iron.  She has no gastrointestinal complaints. Denies weight loss, abdominal pain, constipation, diarrhea, change in stool caliber, melena, hematochezia, nausea, vomiting, dysphagia, reflux symptoms, chest pain.   Current Medications, Allergies, Past Medical History, Past Surgical History, Family History and Social History were reviewed in [employer] record.  Physical Exam: General: Well developed, well nourished, no acute distress Head: Normocephalic and atraumatic Eyes:  sclerae anicteric, EOMI Ears: Normal auditory acuity Mouth: No deformity or lesions Lungs: Clear throughout to auscultation Heart: Regular rate and rhythm; no murmurs, rubs or bruits Abdomen: Soft, non tender and non distended. No masses, hepatosplenomegaly or hernias noted. Normal Bowel sounds Rectal: Deferred to colonoscopy Musculoskeletal: Symmetrical with no gross deformities  Pulses:  Normal pulses noted Extremities: No clubbing, cyanosis, edema or deformities noted Neurological: Alert oriented x 4, grossly nonfocal Psychological:  Alert and cooperative. Normal mood and affect   Assessment and Recommendations:  1.  PBC and steatohepatitis.  Continue Actigall 300 mg p.o. twice daily.  Long-term carb modified, fat modified, weight loss diet supervised by her PCP.  LFTs today.  2.  History of GU, GERD with erosive esophagitis.  Continue pantoprazole 40 mg daily.  3.  Cholelithiasis, asymptomatic.   4.  Anemia, possible iron deficiency.  Request records from PCPs office.  Schedule colonoscopy. The risks (including bleeding, perforation, infection, missed lesions, medication reactions and possible hospitalization or surgery if complications  occur), benefits, and alternatives to colonoscopy with possible biopsy and possible polypectomy were discussed with the patient and they consent to proceed.   5. Personal history of adenomatous colon polyps.  Colonoscopy as above.  6.  Atrial fibrillation with cardioversion performed in July.  Will obtain clearance for colonoscopy given recent cardioversion.  Hold Coumadin 5 days before procedure - will instruct when and how to resume after procedure. Low but real risk of cardiovascular event such as heart attack, stroke, embolism, thrombosis or ischemia/infarct of other organs off Coumadin explained and need to seek urgent help if this occurs. The patient consents to proceed. Will communicate by phone or EMR with patient's prescribing provider to confirm that holding Coumadin is reasonable in this case.

## 2018-02-18 NOTE — Telephone Encounter (Signed)
Dr. Caryl Comes, just checking - I see pt is now persistent a fib and on BB alone with coumadin.  OK for colonoscopy in Nov.  ?

## 2018-02-18 NOTE — Patient Instructions (Signed)
Your provider has requested that you go to the basement level for lab work before leaving today. Press "B" on the elevator. The lab is located at the first door on the left as you exit the elevator.  We have sent the following medications to your pharmacy for you to pick up at your convenience:Protonix and Actigall.  You have been scheduled for a colonoscopy. Please follow written instructions given to you at your visit today.  Please pick up your prep supplies at the pharmacy within the next 1-3 days. If you use inhalers (even only as needed), please bring them with you on the day of your procedure. Your physician has requested that you go to www.startemmi.com and enter the access code given to you at your visit today. This web site gives a general overview about your procedure. However, you should still follow specific instructions given to you by our office regarding your preparation for the procedure.  Thank you for choosing me and [facility].  Pricilla Riffle. Dagoberto Ligas., MD., Marval Regal

## 2018-02-18 NOTE — Telephone Encounter (Signed)
[facility] Pre-operative Risk Assessment     Request for surgical clearance:     Endoscopy Procedure  What type of surgery is being performed?     Colonoscopy  When is this surgery scheduled?     03/23/18  What type of clearance is required ?   Pharmacy  Are there any medications that need to be held prior to surgery and how long? Brilinta x 7 days  Practice name and name of physician performing surgery?      [facility]  What is your office phone and fax number?      Phone- [phone]  [phone]  Anesthesia type (None, local, MAC, general) ?       MAC   PLEASE GIVE CARDIAC CLEARANCE FOR POST CARDIOVERSION

## 2018-02-19 NOTE — Telephone Encounter (Signed)
   Primary Cardiologist: Virl Axe, MD  Chart reviewed as part of pre-operative protocol coverage. Patient was contacted 02/19/2018 in reference to pre-operative risk assessment for pending surgery as outlined below.  Sara Hancock was last seen on 12/24/17 by Dr. Caryl Comes.  Since that day, Sara Hancock has done well, no chest pain and no SOB.  No awareness of racing heart rate.  she is now in persistent a fib and rate is from 80 to 96.  Ok to hold coumadin for 5 days prior to procedure and resume as soon as possible thereafter  No need for crossover with Lovenox.    Did review with Dr. Caryl Comes and he agrees acceptable risk to proceed with colonoscopy.    Please note Pre-op note mentions Brilinta BUT pat is not taking Brilinta.   Therefore, based on ACC/AHA guidelines, the patient would be at acceptable risk for the planned procedure without further cardiovascular testing.   I will route this recommendation to the requesting party via Epic fax function and remove from pre-op pool.  Please call with questions.  Cecilie Kicks, NP 02/19/2018, 8:45 AM

## 2018-02-19 NOTE — Telephone Encounter (Signed)
Patient notified to hold coumadin 5 days prior to your procedure per Cardiology. Patient agreed and verbalized understanding.

## 2018-03-11 DIAGNOSIS — E119 Type 2 diabetes mellitus without complications: Secondary | ICD-10-CM | POA: Diagnosis not present

## 2018-03-11 DIAGNOSIS — H25813 Combined forms of age-related cataract, bilateral: Secondary | ICD-10-CM | POA: Diagnosis not present

## 2018-03-11 DIAGNOSIS — H40003 Preglaucoma, unspecified, bilateral: Secondary | ICD-10-CM | POA: Diagnosis not present

## 2018-03-11 DIAGNOSIS — Z794 Long term (current) use of insulin: Secondary | ICD-10-CM | POA: Diagnosis not present

## 2018-03-11 DIAGNOSIS — H43813 Vitreous degeneration, bilateral: Secondary | ICD-10-CM | POA: Diagnosis not present

## 2018-03-11 DIAGNOSIS — Z8669 Personal history of other diseases of the nervous system and sense organs: Secondary | ICD-10-CM | POA: Diagnosis not present

## 2018-03-15 ENCOUNTER — Ambulatory Visit (INDEPENDENT_AMBULATORY_CARE_PROVIDER_SITE_OTHER): Payer: Medicare Other | Admitting: *Deleted

## 2018-03-15 DIAGNOSIS — I480 Paroxysmal atrial fibrillation: Secondary | ICD-10-CM | POA: Diagnosis not present

## 2018-03-15 DIAGNOSIS — Z5181 Encounter for therapeutic drug level monitoring: Secondary | ICD-10-CM | POA: Diagnosis not present

## 2018-03-15 LAB — POCT INR: INR: 2.2 (ref 2.0–3.0)

## 2018-03-15 NOTE — Patient Instructions (Addendum)
Last dose of Coumadin 03/17/18 then start holding for the procedure.  After your procedure, you should resume your when MD states ok to do so. Once you resume you will take 1/2 tablet more with normal dose for 2 days.  Call with any questions [phone] Description   Continue on same dosage 1/2 tablet daily except 1 tablet on Tuesdays and Saturdays. Please refer to pt instructions for holding procedure.  Recheck in 1 week after procedure.  Call with new medications or any procedures  [phone].

## 2018-03-19 DIAGNOSIS — E782 Mixed hyperlipidemia: Secondary | ICD-10-CM | POA: Diagnosis not present

## 2018-03-19 DIAGNOSIS — I10 Essential (primary) hypertension: Secondary | ICD-10-CM | POA: Diagnosis not present

## 2018-03-19 DIAGNOSIS — R7301 Impaired fasting glucose: Secondary | ICD-10-CM | POA: Diagnosis not present

## 2018-03-19 DIAGNOSIS — E1122 Type 2 diabetes mellitus with diabetic chronic kidney disease: Secondary | ICD-10-CM | POA: Diagnosis not present

## 2018-03-23 ENCOUNTER — Encounter: Payer: Self-pay | Admitting: Gastroenterology

## 2018-03-23 ENCOUNTER — Ambulatory Visit (AMBULATORY_SURGERY_CENTER): Payer: Medicare Other | Admitting: Gastroenterology

## 2018-03-23 VITALS — BP 118/72 | HR 79 | Temp 97.1°F | Resp 14 | Ht 62.0 in | Wt 178.0 lb

## 2018-03-23 DIAGNOSIS — D124 Benign neoplasm of descending colon: Secondary | ICD-10-CM | POA: Diagnosis not present

## 2018-03-23 DIAGNOSIS — I4891 Unspecified atrial fibrillation: Secondary | ICD-10-CM | POA: Diagnosis not present

## 2018-03-23 DIAGNOSIS — D127 Benign neoplasm of rectosigmoid junction: Secondary | ICD-10-CM | POA: Diagnosis not present

## 2018-03-23 DIAGNOSIS — D128 Benign neoplasm of rectum: Secondary | ICD-10-CM | POA: Diagnosis not present

## 2018-03-23 DIAGNOSIS — E119 Type 2 diabetes mellitus without complications: Secondary | ICD-10-CM | POA: Diagnosis not present

## 2018-03-23 DIAGNOSIS — Z86010 Personal history of colonic polyps: Secondary | ICD-10-CM | POA: Diagnosis not present

## 2018-03-23 DIAGNOSIS — D125 Benign neoplasm of sigmoid colon: Secondary | ICD-10-CM

## 2018-03-23 DIAGNOSIS — I10 Essential (primary) hypertension: Secondary | ICD-10-CM | POA: Diagnosis not present

## 2018-03-23 DIAGNOSIS — D129 Benign neoplasm of anus and anal canal: Secondary | ICD-10-CM

## 2018-03-23 LAB — HM COLONOSCOPY

## 2018-03-23 MED ORDER — SODIUM CHLORIDE 0.9 % IV SOLN: 500.0000 mL | Freq: Once | INTRAVENOUS | Status: DC

## 2018-03-23 NOTE — Op Note (Signed)
[place] Patient Name: Sara Hancock Procedure Date: 03/23/2018 2:44 PM MRN: [id number] Endoscopist: Ladene Artist , MD Age: 83 Referring MD:  Date of Birth: July 09, 1934 Gender: Female Account #: [id number] Procedure:                Colonoscopy Indications:              Surveillance: Personal history of adenomatous                            polyps on last colonoscopy > 5 years ago Medicines:                Monitored Anesthesia Care Procedure:                Pre-Anesthesia Assessment:                           - Prior to the procedure, a History and Physical                            was performed, and patient medications and                            allergies were reviewed. The patient's tolerance of                            previous anesthesia was also reviewed. The risks                            and benefits of the procedure and the sedation                            options and risks were discussed with the patient.                            All questions were answered, and informed consent                            was obtained. Prior Anticoagulants: The patient has                            taken Coumadin (warfarin), last dose was 5 days                            prior to procedure. ASA Grade Assessment: III - A                            patient with severe systemic disease. After                            reviewing the risks and benefits, the patient was                            deemed in satisfactory condition to undergo the  procedure.                           After obtaining informed consent, the colonoscope                            was passed under direct vision. Throughout the                            procedure, the patient's blood pressure, pulse, and                            oxygen saturations were monitored continuously. The                            Colonoscope was introduced through the anus and                         advanced to the the cecum, identified by                            appendiceal orifice and ileocecal valve. The                            ileocecal valve, appendiceal orifice, and rectum                            were photographed. The quality of the bowel                            preparation was good. The colonoscopy was performed                            without difficulty. The patient tolerated the                            procedure well. Scope In: 2:51:48 PM Scope Out: 3:14:22 PM Scope Withdrawal Time: 0 hours 18 minutes 53 seconds  Total Procedure Duration: 0 hours 22 minutes 34 seconds  Findings:                 The perianal and digital rectal examinations were                            normal.                           Seven sessile polyps were found in the rectum (1),                            sigmoid colon (3) and descending colon (3). The                            polyps were 6 to 8 mm in size. These polyps were  removed with a cold snare. Resection and retrieval                            were complete.                           Internal hemorrhoids were found during                            retroflexion. The hemorrhoids were small and Grade                            I (internal hemorrhoids that do not prolapse).                           The exam was otherwise without abnormality on                            direct and retroflexion views. Complications:            No immediate complications. Estimated blood loss:                            None. Estimated Blood Loss:     Estimated blood loss: none. Impression:               - Seven 6 to 8 mm polyps in the rectum, in the                            sigmoid colon and in the descending colon, removed                            with a cold snare. Resected and retrieved.                           - Internal hemorrhoids.                           - The examination was  otherwise normal on direct                            and retroflexion views. Recommendation:           - Patient has a contact number available for                            emergencies. The signs and symptoms of potential                            delayed complications were discussed with the                            patient. Return to normal activities tomorrow.                            Written discharge instructions were provided to the  patient.                           - Resume previous diet.                           - Continue present medications.                           - Await pathology results.                           - No aspirin, ibuprofen, naproxen, or other                            non-steroidal anti-inflammatory drugs for 2 weeks                            after polyp removal.                           - Resume Coumadin (warfarin) at prior dose                            tomorrow. Refer to managing physician for further                            adjustment of therapy.                           - No repeat colonoscopy due to age. Ladene Artist, MD 03/23/2018 3:19:22 PM This report has been signed electronically.

## 2018-03-23 NOTE — Patient Instructions (Signed)
Handouts Provided:  Polyps  No aspirin, ibuprofen, naproxen, or other non-steroidal anti-inflammatory drugs for 2 weeks after polyp removal  Resume Coumadin at prior dose tomorrow.  Please contact managing physician for further adjustment of therapy.   YOU HAD AN ENDOSCOPIC PROCEDURE TODAY AT [facility]:   Refer to the procedure report that was given to you for any specific questions about what was found during the examination.  If the procedure report does not answer your questions, please call your gastroenterologist to clarify.  If you requested that your care partner not be given the details of your procedure findings, then the procedure report has been included in a sealed envelope for you to review at your convenience later.  YOU SHOULD EXPECT: Some feelings of bloating in the abdomen. Passage of more gas than usual.  Walking can help get rid of the air that was put into your GI tract during the procedure and reduce the bloating. If you had a lower endoscopy (such as a colonoscopy or flexible sigmoidoscopy) you may notice spotting of blood in your stool or on the toilet paper. If you underwent a bowel prep for your procedure, you may not have a normal bowel movement for a few days.  Please Note:  You might notice some irritation and congestion in your nose or some drainage.  This is from the oxygen used during your procedure.  There is no need for concern and it should clear up in a day or so.  SYMPTOMS TO REPORT IMMEDIATELY:   Following lower endoscopy (colonoscopy or flexible sigmoidoscopy):  Excessive amounts of blood in the stool  Significant tenderness or worsening of abdominal pains  Swelling of the abdomen that is new, acute  Fever of 100F or higher  For urgent or emergent issues, a gastroenterologist can be reached at any hour by calling [phone].   DIET:  We do recommend a small meal at first, but then you may proceed to your regular diet.  Drink plenty  of fluids but you should avoid alcoholic beverages for 24 hours.  ACTIVITY:  You should plan to take it easy for the rest of today and you should NOT DRIVE or use heavy machinery until tomorrow (because of the sedation medicines used during the test).    FOLLOW UP: Our staff will call the number listed on your records the next business day following your procedure to check on you and address any questions or concerns that you may have regarding the information given to you following your procedure. If we do not reach you, we will leave a message.  However, if you are feeling well and you are not experiencing any problems, there is no need to return our call.  We will assume that you have returned to your regular daily activities without incident.  If any biopsies were taken you will be contacted by phone or by letter within the next 1-3 weeks.  Please call us at [phone] if you have not heard about the biopsies in 3 weeks.    SIGNATURES/CONFIDENTIALITY: You and/or your care partner have signed paperwork which will be entered into your electronic medical record.  These signatures attest to the fact that that the information above on your After Visit Summary has been reviewed and is understood.  Full responsibility of the confidentiality of this discharge information lies with you and/or your care-partner.

## 2018-03-23 NOTE — Progress Notes (Signed)
Report given to PACU, vss 

## 2018-03-23 NOTE — Progress Notes (Signed)
Called to room to assist during endoscopic procedure.  Patient ID and intended procedure confirmed with present staff. Received instructions for my participation in the procedure from the performing physician.  

## 2018-03-23 NOTE — Progress Notes (Signed)
Pt's states no medical or surgical changes since previsit or office visit. 

## 2018-03-24 ENCOUNTER — Telehealth: Payer: Self-pay | Admitting: *Deleted

## 2018-03-24 NOTE — Telephone Encounter (Signed)
  Follow up Call-  Call back number 03/23/2018  Post procedure Call Back phone  # [phone]  Permission to leave phone message Yes  Some recent data might be hidden     Patient questions:  Do you have a fever, pain , or abdominal swelling? No. Pain Score  0 *  Have you tolerated food without any problems? Yes.    Have you been able to return to your normal activities? Yes.    Do you have any questions about your discharge instructions: Diet   No. Medications  No. Follow up visit  No.  Do you have questions or concerns about your Care? No.  Actions: * If pain score is 4 or above: No action needed, pain <4.

## 2018-03-25 DIAGNOSIS — D508 Other iron deficiency anemias: Secondary | ICD-10-CM | POA: Diagnosis not present

## 2018-03-25 DIAGNOSIS — N183 Chronic kidney disease, stage 3 (moderate): Secondary | ICD-10-CM | POA: Diagnosis not present

## 2018-03-25 DIAGNOSIS — I131 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, with stage 1 through stage 4 chronic kidney disease, or unspecified chronic kidney disease: Secondary | ICD-10-CM | POA: Diagnosis not present

## 2018-03-25 DIAGNOSIS — M546 Pain in thoracic spine: Secondary | ICD-10-CM | POA: Diagnosis not present

## 2018-03-25 DIAGNOSIS — I480 Paroxysmal atrial fibrillation: Secondary | ICD-10-CM | POA: Diagnosis not present

## 2018-03-25 DIAGNOSIS — E782 Mixed hyperlipidemia: Secondary | ICD-10-CM | POA: Diagnosis not present

## 2018-03-25 DIAGNOSIS — E668 Other obesity: Secondary | ICD-10-CM | POA: Diagnosis not present

## 2018-03-25 DIAGNOSIS — Z6834 Body mass index (BMI) 34.0-34.9, adult: Secondary | ICD-10-CM | POA: Diagnosis not present

## 2018-03-25 DIAGNOSIS — R600 Localized edema: Secondary | ICD-10-CM | POA: Diagnosis not present

## 2018-03-25 DIAGNOSIS — K743 Primary biliary cirrhosis: Secondary | ICD-10-CM | POA: Diagnosis not present

## 2018-03-25 DIAGNOSIS — E1122 Type 2 diabetes mellitus with diabetic chronic kidney disease: Secondary | ICD-10-CM | POA: Diagnosis not present

## 2018-04-01 ENCOUNTER — Ambulatory Visit (INDEPENDENT_AMBULATORY_CARE_PROVIDER_SITE_OTHER): Payer: Medicare Other

## 2018-04-01 DIAGNOSIS — Z5181 Encounter for therapeutic drug level monitoring: Secondary | ICD-10-CM

## 2018-04-01 DIAGNOSIS — I480 Paroxysmal atrial fibrillation: Secondary | ICD-10-CM | POA: Diagnosis not present

## 2018-04-01 LAB — POCT INR: INR: 2 (ref 2.0–3.0)

## 2018-04-01 NOTE — Patient Instructions (Signed)
Description   Continue on same dosage 1/2 tablet daily except 1 tablet on Tuesdays and Saturdays.  Recheck in 4 weeks.  Call with new medications or any procedures  [phone].

## 2018-04-19 ENCOUNTER — Encounter: Payer: Self-pay | Admitting: Gastroenterology

## 2018-04-22 ENCOUNTER — Other Ambulatory Visit: Payer: Self-pay | Admitting: Internal Medicine

## 2018-04-29 ENCOUNTER — Ambulatory Visit (INDEPENDENT_AMBULATORY_CARE_PROVIDER_SITE_OTHER): Payer: Medicare Other | Admitting: *Deleted

## 2018-04-29 DIAGNOSIS — I480 Paroxysmal atrial fibrillation: Secondary | ICD-10-CM

## 2018-04-29 DIAGNOSIS — Z5181 Encounter for therapeutic drug level monitoring: Secondary | ICD-10-CM | POA: Diagnosis not present

## 2018-04-29 LAB — POCT INR: INR: 2.1 (ref 2.0–3.0)

## 2018-04-29 NOTE — Patient Instructions (Addendum)
  Description   Continue on same dosage 1/2 tablet daily except 1 tablet on Tuesdays and Saturdays.  Recheck in 5 weeks.  Call with new medications or any procedures  [phone].

## 2018-05-26 DIAGNOSIS — J018 Other acute sinusitis: Secondary | ICD-10-CM | POA: Diagnosis not present

## 2018-06-03 ENCOUNTER — Ambulatory Visit (INDEPENDENT_AMBULATORY_CARE_PROVIDER_SITE_OTHER): Payer: Medicare Other | Admitting: *Deleted

## 2018-06-03 DIAGNOSIS — Z5181 Encounter for therapeutic drug level monitoring: Secondary | ICD-10-CM

## 2018-06-03 DIAGNOSIS — I480 Paroxysmal atrial fibrillation: Secondary | ICD-10-CM | POA: Diagnosis not present

## 2018-06-03 LAB — POCT INR: INR: 2.2 (ref 2.0–3.0)

## 2018-06-03 NOTE — Patient Instructions (Signed)
Description   Continue on same dosage 1/2 tablet daily except 1 tablet on Tuesdays and Saturdays.  Recheck in 6 weeks.  Call with new medications or any procedures  [phone].

## 2018-06-17 ENCOUNTER — Ambulatory Visit (INDEPENDENT_AMBULATORY_CARE_PROVIDER_SITE_OTHER): Payer: Medicare Other | Admitting: Nurse Practitioner

## 2018-06-17 VITALS — BP 112/70 | HR 86 | Ht 62.0 in | Wt 179.0 lb

## 2018-06-17 DIAGNOSIS — I10 Essential (primary) hypertension: Secondary | ICD-10-CM

## 2018-06-17 DIAGNOSIS — I4821 Permanent atrial fibrillation: Secondary | ICD-10-CM

## 2018-06-17 MED ORDER — METOPROLOL TARTRATE 75 MG PO TABS: 75.0000 mg | ORAL_TABLET | ORAL | 3 refills | Status: DC

## 2018-06-17 MED ORDER — WARFARIN SODIUM 5 MG PO TABS: ORAL_TABLET | ORAL | 1 refills | Status: DC

## 2018-06-17 NOTE — Patient Instructions (Signed)
Medication Instructions:  NONE If you need a refill on your cardiac medications before your next appointment, please call your pharmacy.   Lab work: NONE If you have labs (blood work) drawn today and your tests are completely normal, you will receive your results only by: Marland Kitchen MyChart Message (if you have MyChart) OR . A paper copy in the mail If you have any lab test that is abnormal or we need to change your treatment, we will call you to review the results.  Testing/Procedures: NONE  Follow-Up: At [facility], you and your health needs are our priority.  As part of our continuing mission to provide you with exceptional heart care, we have created designated Provider Care Teams.  These Care Teams include your primary Cardiologist (physician) and Advanced Practice Providers (APPs -  Physician Assistants and Nurse Practitioners) who all work together to provide you with the care you need, when you need it. You will need a follow up appointment in 1 years.  Please call our office 2 months in advance to schedule this appointment.  You may see Dr Caryl Comes or one of the following Advanced Practice Providers on your designated Care Team:   Chanetta Marshall, NP . Tommye Standard, PA-C  Any Other Special Instructions Will Be Listed Below (If Applicable).

## 2018-06-17 NOTE — Progress Notes (Signed)
Electrophysiology Office Note Date: 06/17/2018  ID:  Sara Hancock, DOB December 12, 1934, MRN [id number]  PCP: Rochel Brome, MD Electrophysiologist: Caryl Comes  CC: AF follow up  Sara Hancock is a 84 y.o. female seen today for Dr Caryl Comes.  She presents today for routine electrophysiology followup.  Since last being seen in our clinic, the patient reports doing very well.  She denies chest pain, palpitations, dyspnea, PND, orthopnea, nausea, vomiting, dizziness, syncope, edema, weight gain, or early satiety.  Past Medical History:  Diagnosis Date  . Anemia   . Arthritis   . Biliary cirrhosis ([place])   . Cholelithiasis 01/09/12  . Diverticulosis of colon with hemorrhage 06/08/2000  . Esophageal stenosis 01/08/11  . Esophagitis 01/08/11  . GERD (gastroesophageal reflux disease)   . Hiatal hernia 01/08/11  . HTN (hypertension)   . Hx of adenomatous colonic polyps 01/08/11  . Hyperplastic colon polyp 06/08/2000, 07/31/2003, 01/08/11  . Obesity   . Osteopenia   . PAF (paroxysmal atrial fibrillation) ([facility])    occurring in the setting of bradycardia and mild first degree AV block followed by Dr. Jens Som  . Pure hypercholesterolemia   . Type II or unspecified type diabetes mellitus without mention of complication, not stated as uncontrolled   . Uterine fibroid 01/09/12   Past Surgical History:  Procedure Laterality Date  . BREAST CYST EXCISION     left  . CARDIOVERSION N/A 11/27/2017   Procedure: CARDIOVERSION;  Surgeon: Sanda Klein, MD;  Location: [facility];  Service: Cardiovascular;  Laterality: N/A;  . COLONOSCOPY    . INSERTION OF MESH  05/18/2012   Procedure: INSERTION OF MESH;  Surgeon: Adin Hector, MD;  Location: [facility];  Service: General;  Laterality: N/A;  . LIVER BIOPSY  05/18/2012   Procedure: LIVER BIOPSY;  Surgeon: Adin Hector, MD;  Location: [facility];  Service: General;;  . POLYPECTOMY    . THYROIDECTOMY, PARTIAL  1980  . TUBAL LIGATION  1972  . VENTRAL HERNIA REPAIR   05/18/2012   Procedure: LAPAROSCOPIC VENTRAL HERNIA;  Surgeon: Adin Hector, MD;  Location: [facility];  Service: General;  Laterality: N/A;  Laparoscopic Ventral Wall Hernia with Mesh    Current Outpatient Medications  Medication Sig Dispense Refill  . calcium-vitamin D (OSCAL WITH D) 250-125 MG-UNIT per tablet Take 1 tablet by mouth daily.    . Cholecalciferol (VITAMIN D3) 1000 units CAPS Take 1,000 Units by mouth daily.    . Ferrous Sulfate (IRON) 325 (65 FE) MG TABS Take 325 mg by mouth daily. 30 each 0  . fexofenadine (ALLEGRA) 180 MG tablet Take 180 mg by mouth daily.     . folic acid (FOLVITE) 998 MCG tablet Take 400 mcg by mouth daily.    . furosemide (LASIX) 40 MG tablet Take 1 tablet by mouth 2 (two) times daily.    . hydrALAZINE (APRESOLINE) 100 MG tablet Take 1 tablet by mouth 2 (two) times daily.  1  . insulin detemir (LEVEMIR FLEXPEN) 100 UNIT/ML injection Inject 25 Units into the skin as directed. 18 units at bedtime and 32 units in the a.m.    . irbesartan (AVAPRO) 300 MG tablet Take 300 mg by mouth every evening.    Marland Kitchen ketoconazole (NIZORAL) 2 % cream Apply 1 application topically daily as needed. Around skin folds for irritation    . metFORMIN (GLUCOPHAGE) 1000 MG tablet Take 1,000 mg by mouth 2 (two) times daily with a meal.     .  Metoprolol Tartrate 75 MG TABS Take 75 mg by mouth as directed. Take 75mg  in the morning and 75mg  in the evening 180 tablet 3  . Multiple Vitamin (MULTIVITAMIN WITH MINERALS) TABS Take 1 tablet by mouth daily.    . pantoprazole (PROTONIX) 40 MG tablet TAKE 1 TABLET(40 MG) BY MOUTH DAILY 90 tablet 3  . simvastatin (ZOCOR) 40 MG tablet Take 40 mg by mouth every evening.     . sitaGLIPtin (JANUVIA) 100 MG tablet Take 100 mg by mouth at bedtime.     . ursodiol (ACTIGALL) 300 MG capsule Take 1 capsule (300 mg total) by mouth 2 (two) times daily. 180 capsule 1  . vitamin B-12 (CYANOCOBALAMIN) 1000 MCG tablet Take 1,000 mcg by mouth daily.    . vitamin C  (ASCORBIC ACID) 500 MG tablet Take 500 mg by mouth daily.    . vitamin E 400 UNIT capsule Take 400 Units by mouth daily.    Marland Kitchen warfarin (COUMADIN) 5 MG tablet TAKE AS DIRECTED BY COUMADIN CLINIC 90 tablet 1   No current facility-administered medications for this visit.     Allergies:   Ace inhibitors; Capoten [captopril]; and Neomycin-bacitracin zn-polymyx   Social History: Social History   Socioeconomic History  . Marital status: Married    Spouse name: Not on file  . Number of children: 2  . Years of education: Not on file  . Highest education level: Not on file  Occupational History  . Occupation: retired    Fish farm manager: RETIRED  Social Needs  . Financial resource strain: Not on file  . Food insecurity:    Worry: Not on file    Inability: Not on file  . Transportation needs:    Medical: Not on file    Non-medical: Not on file  Tobacco Use  . Smoking status: Never Smoker  . Smokeless tobacco: Never Used  Substance and Sexual Activity  . Alcohol use: No  . Drug use: No  . Sexual activity: Not Currently  Lifestyle  . Physical activity:    Days per week: Not on file    Minutes per session: Not on file  . Stress: Not on file  Relationships  . Social connections:    Talks on phone: Not on file    Gets together: Not on file    Attends religious service: Not on file    Active member of club or organization: Not on file    Attends meetings of clubs or organizations: Not on file    Relationship status: Not on file  . Intimate partner violence:    Fear of current or ex partner: Not on file    Emotionally abused: Not on file    Physically abused: Not on file    Forced sexual activity: Not on file  Other Topics Concern  . Not on file  Social History Narrative  . Not on file    Family History: Family History  Problem Relation Age of Onset  . Heart attack Father   . Stroke Mother   . Diabetes Sister        x 2  . Diabetes Brother   . Colon cancer Neg Hx   .  Esophageal cancer Neg Hx   . Stomach cancer Neg Hx   . Rectal cancer Neg Hx     Review of Systems: All other systems reviewed and are otherwise negative except as noted above.   Physical Exam: VS:  BP 112/70   Pulse 86   Ht  5\' 2"  (1.575 m)   Wt 179 lb (81.2 kg)   BMI 32.74 kg/m  , BMI Body mass index is 32.74 kg/m. Wt Readings from Last 3 Encounters:  06/17/18 179 lb (81.2 kg)  03/23/18 178 lb (80.7 kg)  02/18/18 178 lb 8 oz (81 kg)    GEN- The patient is elderly appearing, alert and oriented x 3 today.   HEENT: normocephalic, atraumatic; sclera clear, conjunctiva pink; hearing intact; oropharynx clear; neck supple  Lungs- Clear to ausculation bilaterally, normal work of breathing.  No wheezes, rales, rhonchi Heart- Irregular rate and rhythm  GI- soft, non-tender, non-distended, bowel sounds present  Extremities- no clubbing, cyanosis, +trace edema MS- no significant deformity or atrophy Skin- warm and dry, no rash or lesion  Psych- euthymic mood, full affect Neuro- strength and sensation are intact   EKG:  EKG is ordered today. The ekg ordered today shows atrial fibrillation, rate 86  Recent Labs: 11/16/2017: BUN 40; Creatinine, Ser 1.22; Hemoglobin 10.6; Platelets 256; Potassium 4.0; Sodium 143 02/18/2018: ALT 21    Other studies Reviewed: Additional studies/ records that were reviewed today include: Dr Olin Pia office notes  Assessment and Plan:  1.  Permanent atrial fibrillation Asymptomatic  V rates controlled  Continue Warfarin for CHADS2VASC of 5  2.  HTN Stable No change required today    Current medicines are reviewed at length with the patient today.   The patient does not have concerns regarding her medicines.  The following changes were made today:  none  Labs/ tests ordered today include: none Orders Placed This Encounter  Procedures  . EKG 12-Lead     Disposition:   Follow up with Dr Caryl Comes 1 year   Signed, Chanetta Marshall,  NP 06/17/2018 10:36 AM   [place] [place] [address] [phone] (office) [phone] (fax)

## 2018-06-28 DIAGNOSIS — E782 Mixed hyperlipidemia: Secondary | ICD-10-CM | POA: Diagnosis not present

## 2018-06-28 DIAGNOSIS — E1122 Type 2 diabetes mellitus with diabetic chronic kidney disease: Secondary | ICD-10-CM | POA: Diagnosis not present

## 2018-06-28 DIAGNOSIS — I10 Essential (primary) hypertension: Secondary | ICD-10-CM | POA: Diagnosis not present

## 2018-07-01 DIAGNOSIS — D508 Other iron deficiency anemias: Secondary | ICD-10-CM | POA: Diagnosis not present

## 2018-07-01 DIAGNOSIS — Z6833 Body mass index (BMI) 33.0-33.9, adult: Secondary | ICD-10-CM | POA: Diagnosis not present

## 2018-07-01 DIAGNOSIS — I131 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, with stage 1 through stage 4 chronic kidney disease, or unspecified chronic kidney disease: Secondary | ICD-10-CM | POA: Diagnosis not present

## 2018-07-01 DIAGNOSIS — E1122 Type 2 diabetes mellitus with diabetic chronic kidney disease: Secondary | ICD-10-CM | POA: Diagnosis not present

## 2018-07-01 DIAGNOSIS — N183 Chronic kidney disease, stage 3 (moderate): Secondary | ICD-10-CM | POA: Diagnosis not present

## 2018-07-01 DIAGNOSIS — K743 Primary biliary cirrhosis: Secondary | ICD-10-CM | POA: Diagnosis not present

## 2018-07-01 DIAGNOSIS — Z794 Long term (current) use of insulin: Secondary | ICD-10-CM | POA: Diagnosis not present

## 2018-07-01 DIAGNOSIS — E782 Mixed hyperlipidemia: Secondary | ICD-10-CM | POA: Diagnosis not present

## 2018-07-01 DIAGNOSIS — E668 Other obesity: Secondary | ICD-10-CM | POA: Diagnosis not present

## 2018-07-01 DIAGNOSIS — I480 Paroxysmal atrial fibrillation: Secondary | ICD-10-CM | POA: Diagnosis not present

## 2018-07-11 ENCOUNTER — Other Ambulatory Visit: Payer: Self-pay | Admitting: Gastroenterology

## 2018-07-15 ENCOUNTER — Ambulatory Visit (INDEPENDENT_AMBULATORY_CARE_PROVIDER_SITE_OTHER): Payer: Medicare Other | Admitting: Pharmacist

## 2018-07-15 DIAGNOSIS — Z5181 Encounter for therapeutic drug level monitoring: Secondary | ICD-10-CM

## 2018-07-15 DIAGNOSIS — I480 Paroxysmal atrial fibrillation: Secondary | ICD-10-CM | POA: Diagnosis not present

## 2018-07-15 LAB — POCT INR: INR: 2.3 (ref 2.0–3.0)

## 2018-07-15 NOTE — Patient Instructions (Signed)
Continue on same dosage 1/2 tablet daily except 1 tablet on Tuesdays and Saturdays.  Recheck in 6 weeks.  Call with new medications or any procedures  [phone].

## 2018-08-23 ENCOUNTER — Telehealth: Payer: Self-pay | Admitting: *Deleted

## 2018-08-23 NOTE — Telephone Encounter (Signed)

## 2018-08-24 ENCOUNTER — Other Ambulatory Visit: Payer: Self-pay

## 2018-08-24 ENCOUNTER — Ambulatory Visit (INDEPENDENT_AMBULATORY_CARE_PROVIDER_SITE_OTHER): Payer: Medicare Other | Admitting: *Deleted

## 2018-08-24 DIAGNOSIS — I480 Paroxysmal atrial fibrillation: Secondary | ICD-10-CM | POA: Diagnosis not present

## 2018-08-24 DIAGNOSIS — Z5181 Encounter for therapeutic drug level monitoring: Secondary | ICD-10-CM

## 2018-08-24 LAB — POCT INR: INR: 2.2 (ref 2.0–3.0)

## 2018-09-27 DIAGNOSIS — E782 Mixed hyperlipidemia: Secondary | ICD-10-CM | POA: Diagnosis not present

## 2018-09-27 DIAGNOSIS — N183 Chronic kidney disease, stage 3 (moderate): Secondary | ICD-10-CM | POA: Diagnosis not present

## 2018-09-27 DIAGNOSIS — E1122 Type 2 diabetes mellitus with diabetic chronic kidney disease: Secondary | ICD-10-CM | POA: Diagnosis not present

## 2018-09-27 DIAGNOSIS — I131 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, with stage 1 through stage 4 chronic kidney disease, or unspecified chronic kidney disease: Secondary | ICD-10-CM | POA: Diagnosis not present

## 2018-09-30 DIAGNOSIS — D508 Other iron deficiency anemias: Secondary | ICD-10-CM | POA: Diagnosis not present

## 2018-09-30 DIAGNOSIS — K743 Primary biliary cirrhosis: Secondary | ICD-10-CM | POA: Diagnosis not present

## 2018-09-30 DIAGNOSIS — I480 Paroxysmal atrial fibrillation: Secondary | ICD-10-CM | POA: Diagnosis not present

## 2018-09-30 DIAGNOSIS — I131 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, with stage 1 through stage 4 chronic kidney disease, or unspecified chronic kidney disease: Secondary | ICD-10-CM | POA: Diagnosis not present

## 2018-09-30 DIAGNOSIS — E782 Mixed hyperlipidemia: Secondary | ICD-10-CM | POA: Diagnosis not present

## 2018-09-30 DIAGNOSIS — E1122 Type 2 diabetes mellitus with diabetic chronic kidney disease: Secondary | ICD-10-CM | POA: Diagnosis not present

## 2018-09-30 DIAGNOSIS — N183 Chronic kidney disease, stage 3 (moderate): Secondary | ICD-10-CM | POA: Diagnosis not present

## 2018-09-30 DIAGNOSIS — Z6832 Body mass index (BMI) 32.0-32.9, adult: Secondary | ICD-10-CM | POA: Diagnosis not present

## 2018-09-30 DIAGNOSIS — Z794 Long term (current) use of insulin: Secondary | ICD-10-CM | POA: Diagnosis not present

## 2018-09-30 DIAGNOSIS — E668 Other obesity: Secondary | ICD-10-CM | POA: Diagnosis not present

## 2018-10-09 DIAGNOSIS — S61206A Unspecified open wound of right little finger without damage to nail, initial encounter: Secondary | ICD-10-CM | POA: Diagnosis not present

## 2018-10-11 ENCOUNTER — Telehealth: Payer: Self-pay

## 2018-10-11 NOTE — Telephone Encounter (Signed)

## 2018-10-12 ENCOUNTER — Ambulatory Visit (INDEPENDENT_AMBULATORY_CARE_PROVIDER_SITE_OTHER): Payer: Medicare Other | Admitting: *Deleted

## 2018-10-12 ENCOUNTER — Other Ambulatory Visit: Payer: Self-pay

## 2018-10-12 DIAGNOSIS — I480 Paroxysmal atrial fibrillation: Secondary | ICD-10-CM

## 2018-10-12 DIAGNOSIS — Z5181 Encounter for therapeutic drug level monitoring: Secondary | ICD-10-CM | POA: Diagnosis not present

## 2018-10-12 LAB — POCT INR: INR: 2.1 (ref 2.0–3.0)

## 2018-10-12 NOTE — Patient Instructions (Signed)
Description   Continue on same dosage 1/2 tablet daily except 1 tablet on Tuesdays and Saturdays.  Recheck in 6 weeks.  Call with new medications or any procedures  [phone].

## 2018-11-16 ENCOUNTER — Telehealth: Payer: Self-pay

## 2018-11-16 NOTE — Telephone Encounter (Signed)
lmom for prescreen  

## 2018-11-16 NOTE — Telephone Encounter (Signed)

## 2018-11-23 ENCOUNTER — Other Ambulatory Visit: Payer: Self-pay

## 2018-11-23 ENCOUNTER — Ambulatory Visit (INDEPENDENT_AMBULATORY_CARE_PROVIDER_SITE_OTHER): Payer: Medicare Other | Admitting: *Deleted

## 2018-11-23 DIAGNOSIS — Z5181 Encounter for therapeutic drug level monitoring: Secondary | ICD-10-CM

## 2018-11-23 DIAGNOSIS — I480 Paroxysmal atrial fibrillation: Secondary | ICD-10-CM | POA: Diagnosis not present

## 2018-11-23 LAB — POCT INR: INR: 2.1 (ref 2.0–3.0)

## 2018-11-23 NOTE — Patient Instructions (Signed)
Description   Continue on same dosage 1/2 tablet daily except 1 tablet on Tuesdays and Saturdays.  Recheck in 6 weeks.  Call with new medications or any procedures  [phone].

## 2019-01-04 ENCOUNTER — Other Ambulatory Visit: Payer: Self-pay

## 2019-01-04 ENCOUNTER — Ambulatory Visit (INDEPENDENT_AMBULATORY_CARE_PROVIDER_SITE_OTHER): Payer: Medicare Other | Admitting: *Deleted

## 2019-01-04 DIAGNOSIS — Z5181 Encounter for therapeutic drug level monitoring: Secondary | ICD-10-CM

## 2019-01-04 DIAGNOSIS — I480 Paroxysmal atrial fibrillation: Secondary | ICD-10-CM

## 2019-01-04 LAB — POCT INR: INR: 3.1 — AB (ref 2.0–3.0)

## 2019-01-04 NOTE — Patient Instructions (Signed)
Description   Today only take 1/2 tablet, eat a large serving of leafy green vegetable, the continue on same dosage 1/2 tablet daily except 1 tablet on Tuesdays and Saturdays.  Recheck in 4 weeks.  Call with new medications or any procedures  [phone].

## 2019-01-06 DIAGNOSIS — M545 Low back pain: Secondary | ICD-10-CM | POA: Diagnosis not present

## 2019-01-06 DIAGNOSIS — M542 Cervicalgia: Secondary | ICD-10-CM | POA: Diagnosis not present

## 2019-01-06 DIAGNOSIS — M50323 Other cervical disc degeneration at C6-C7 level: Secondary | ICD-10-CM | POA: Diagnosis not present

## 2019-01-06 DIAGNOSIS — M47812 Spondylosis without myelopathy or radiculopathy, cervical region: Secondary | ICD-10-CM | POA: Diagnosis not present

## 2019-01-06 DIAGNOSIS — M546 Pain in thoracic spine: Secondary | ICD-10-CM | POA: Diagnosis not present

## 2019-01-14 DIAGNOSIS — M545 Low back pain: Secondary | ICD-10-CM | POA: Diagnosis not present

## 2019-01-24 DIAGNOSIS — D508 Other iron deficiency anemias: Secondary | ICD-10-CM | POA: Diagnosis not present

## 2019-01-24 DIAGNOSIS — I131 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, with stage 1 through stage 4 chronic kidney disease, or unspecified chronic kidney disease: Secondary | ICD-10-CM | POA: Diagnosis not present

## 2019-01-24 DIAGNOSIS — E782 Mixed hyperlipidemia: Secondary | ICD-10-CM | POA: Diagnosis not present

## 2019-01-24 DIAGNOSIS — E1122 Type 2 diabetes mellitus with diabetic chronic kidney disease: Secondary | ICD-10-CM | POA: Diagnosis not present

## 2019-01-27 DIAGNOSIS — I480 Paroxysmal atrial fibrillation: Secondary | ICD-10-CM | POA: Diagnosis not present

## 2019-01-27 DIAGNOSIS — M545 Low back pain: Secondary | ICD-10-CM | POA: Diagnosis not present

## 2019-01-27 DIAGNOSIS — K743 Primary biliary cirrhosis: Secondary | ICD-10-CM | POA: Diagnosis not present

## 2019-01-27 DIAGNOSIS — E668 Other obesity: Secondary | ICD-10-CM | POA: Diagnosis not present

## 2019-01-27 DIAGNOSIS — E782 Mixed hyperlipidemia: Secondary | ICD-10-CM | POA: Diagnosis not present

## 2019-01-27 DIAGNOSIS — Z794 Long term (current) use of insulin: Secondary | ICD-10-CM | POA: Diagnosis not present

## 2019-01-27 DIAGNOSIS — E1122 Type 2 diabetes mellitus with diabetic chronic kidney disease: Secondary | ICD-10-CM | POA: Diagnosis not present

## 2019-01-27 DIAGNOSIS — Z23 Encounter for immunization: Secondary | ICD-10-CM | POA: Diagnosis not present

## 2019-01-27 DIAGNOSIS — D508 Other iron deficiency anemias: Secondary | ICD-10-CM | POA: Diagnosis not present

## 2019-01-27 DIAGNOSIS — Z6833 Body mass index (BMI) 33.0-33.9, adult: Secondary | ICD-10-CM | POA: Diagnosis not present

## 2019-01-27 DIAGNOSIS — I131 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, with stage 1 through stage 4 chronic kidney disease, or unspecified chronic kidney disease: Secondary | ICD-10-CM | POA: Diagnosis not present

## 2019-01-27 DIAGNOSIS — N183 Chronic kidney disease, stage 3 (moderate): Secondary | ICD-10-CM | POA: Diagnosis not present

## 2019-02-01 ENCOUNTER — Ambulatory Visit (INDEPENDENT_AMBULATORY_CARE_PROVIDER_SITE_OTHER): Payer: Medicare Other | Admitting: Pharmacist Clinician (PhC)/ Clinical Pharmacy Specialist

## 2019-02-01 ENCOUNTER — Other Ambulatory Visit: Payer: Self-pay

## 2019-02-01 DIAGNOSIS — Z5181 Encounter for therapeutic drug level monitoring: Secondary | ICD-10-CM

## 2019-02-01 DIAGNOSIS — I4821 Permanent atrial fibrillation: Secondary | ICD-10-CM | POA: Diagnosis not present

## 2019-02-01 DIAGNOSIS — I480 Paroxysmal atrial fibrillation: Secondary | ICD-10-CM

## 2019-02-01 LAB — POCT INR: INR: 3.1 — AB (ref 2.0–3.0)

## 2019-02-02 DIAGNOSIS — Z0000 Encounter for general adult medical examination without abnormal findings: Secondary | ICD-10-CM | POA: Diagnosis not present

## 2019-02-08 DIAGNOSIS — S22080A Wedge compression fracture of T11-T12 vertebra, initial encounter for closed fracture: Secondary | ICD-10-CM | POA: Diagnosis not present

## 2019-02-08 DIAGNOSIS — M542 Cervicalgia: Secondary | ICD-10-CM | POA: Diagnosis not present

## 2019-02-10 ENCOUNTER — Telehealth: Payer: Self-pay | Admitting: Gastroenterology

## 2019-02-10 ENCOUNTER — Other Ambulatory Visit: Payer: Self-pay | Admitting: Family Medicine

## 2019-02-10 DIAGNOSIS — K743 Primary biliary cirrhosis: Secondary | ICD-10-CM

## 2019-02-10 DIAGNOSIS — Z1231 Encounter for screening mammogram for malignant neoplasm of breast: Secondary | ICD-10-CM

## 2019-02-10 NOTE — Telephone Encounter (Signed)
Pt is requesting order for blood work sent to the lab before her appt with Dr. Fuller Plan on 12/2.

## 2019-02-11 ENCOUNTER — Other Ambulatory Visit: Payer: Self-pay | Admitting: Neurological Surgery

## 2019-02-11 DIAGNOSIS — M542 Cervicalgia: Secondary | ICD-10-CM

## 2019-02-14 DIAGNOSIS — J018 Other acute sinusitis: Secondary | ICD-10-CM | POA: Diagnosis not present

## 2019-02-14 DIAGNOSIS — M810 Age-related osteoporosis without current pathological fracture: Secondary | ICD-10-CM | POA: Diagnosis not present

## 2019-02-15 NOTE — Telephone Encounter (Signed)
Dr. Fuller Plan, do you want labs prior to her appt in December?

## 2019-02-15 NOTE — Telephone Encounter (Signed)
Patient notified that she can come for labs prior to OV

## 2019-02-15 NOTE — Telephone Encounter (Signed)
CBC, CMP a week or two prior to REV

## 2019-03-07 ENCOUNTER — Ambulatory Visit
Admission: RE | Admit: 2019-03-07 | Discharge: 2019-03-07 | Disposition: A | Discharge status: Home / Self Care | Payer: Medicare Other | Source: Ambulatory Visit | Attending: Neurological Surgery | Admitting: Neurological Surgery

## 2019-03-07 ENCOUNTER — Other Ambulatory Visit: Payer: Self-pay

## 2019-03-07 DIAGNOSIS — M542 Cervicalgia: Secondary | ICD-10-CM

## 2019-03-07 DIAGNOSIS — M5030 Other cervical disc degeneration, unspecified cervical region: Secondary | ICD-10-CM | POA: Diagnosis not present

## 2019-03-15 ENCOUNTER — Other Ambulatory Visit: Payer: Self-pay

## 2019-03-15 ENCOUNTER — Ambulatory Visit (INDEPENDENT_AMBULATORY_CARE_PROVIDER_SITE_OTHER): Payer: Medicare Other | Admitting: Pharmacist

## 2019-03-15 DIAGNOSIS — Z5181 Encounter for therapeutic drug level monitoring: Secondary | ICD-10-CM

## 2019-03-15 DIAGNOSIS — I480 Paroxysmal atrial fibrillation: Secondary | ICD-10-CM | POA: Diagnosis not present

## 2019-03-15 LAB — POCT INR: INR: 2.1 (ref 2.0–3.0)

## 2019-03-15 NOTE — Patient Instructions (Signed)
Description   Continue on same dosage 1/2 tablet daily except 1 tablet on Tuesdays and Saturdays. Recheck in 4 weeks.  Call with new medications or any procedures  [phone].      

## 2019-04-08 ENCOUNTER — Other Ambulatory Visit: Payer: Self-pay | Admitting: Gastroenterology

## 2019-04-13 ENCOUNTER — Ambulatory Visit: Payer: Medicare Other

## 2019-04-13 ENCOUNTER — Ambulatory Visit: Payer: Medicare Other | Admitting: Gastroenterology

## 2019-04-14 ENCOUNTER — Other Ambulatory Visit (INDEPENDENT_AMBULATORY_CARE_PROVIDER_SITE_OTHER): Payer: Medicare Other

## 2019-04-14 DIAGNOSIS — H33311 Horseshoe tear of retina without detachment, right eye: Secondary | ICD-10-CM | POA: Diagnosis not present

## 2019-04-14 DIAGNOSIS — E119 Type 2 diabetes mellitus without complications: Secondary | ICD-10-CM | POA: Diagnosis not present

## 2019-04-14 DIAGNOSIS — H40003 Preglaucoma, unspecified, bilateral: Secondary | ICD-10-CM | POA: Diagnosis not present

## 2019-04-14 DIAGNOSIS — K743 Primary biliary cirrhosis: Secondary | ICD-10-CM | POA: Diagnosis not present

## 2019-04-14 DIAGNOSIS — H43813 Vitreous degeneration, bilateral: Secondary | ICD-10-CM | POA: Diagnosis not present

## 2019-04-14 DIAGNOSIS — H25813 Combined forms of age-related cataract, bilateral: Secondary | ICD-10-CM | POA: Diagnosis not present

## 2019-04-14 LAB — CBC
HCT: 35.6 % — ABNORMAL LOW (ref 36.0–46.0)
Hemoglobin: 11.9 g/dL — ABNORMAL LOW (ref 12.0–15.0)
MCHC: 33.4 g/dL (ref 30.0–36.0)
MCV: 92.8 fl (ref 78.0–100.0)
Platelets: 204 10*3/uL (ref 150.0–400.0)
RBC: 3.83 Mil/uL — ABNORMAL LOW (ref 3.87–5.11)
RDW: 15.7 % — ABNORMAL HIGH (ref 11.5–15.5)
WBC: 5.9 10*3/uL (ref 4.0–10.5)

## 2019-04-14 LAB — COMPREHENSIVE METABOLIC PANEL
ALT: 20 U/L (ref 0–35)
AST: 23 U/L (ref 0–37)
Albumin: 4 g/dL (ref 3.5–5.2)
Alkaline Phosphatase: 103 U/L (ref 39–117)
BUN: 39 mg/dL — ABNORMAL HIGH (ref 6–23)
CO2: 28 mEq/L (ref 19–32)
Calcium: 10 mg/dL (ref 8.4–10.5)
Chloride: 102 mEq/L (ref 96–112)
Creatinine, Ser: 1.34 mg/dL — ABNORMAL HIGH (ref 0.40–1.20)
GFR: 37.83 mL/min — ABNORMAL LOW (ref >60.00)
Glucose, Bld: 198 mg/dL — ABNORMAL HIGH (ref 70–99)
Potassium: 4.1 mEq/L (ref 3.5–5.1)
Sodium: 141 mEq/L (ref 135–145)
Total Bilirubin: 0.5 mg/dL (ref 0.2–1.2)
Total Protein: 7.2 g/dL (ref 6.0–8.3)

## 2019-04-19 ENCOUNTER — Other Ambulatory Visit: Payer: Self-pay

## 2019-04-19 ENCOUNTER — Telehealth: Payer: Self-pay | Admitting: Gastroenterology

## 2019-04-19 ENCOUNTER — Ambulatory Visit (INDEPENDENT_AMBULATORY_CARE_PROVIDER_SITE_OTHER): Payer: Medicare Other | Admitting: Pharmacist

## 2019-04-19 DIAGNOSIS — I480 Paroxysmal atrial fibrillation: Secondary | ICD-10-CM | POA: Diagnosis not present

## 2019-04-19 DIAGNOSIS — Z5181 Encounter for therapeutic drug level monitoring: Secondary | ICD-10-CM | POA: Diagnosis not present

## 2019-04-19 LAB — POCT INR: INR: 1.9 — AB (ref 2.0–3.0)

## 2019-04-19 NOTE — Telephone Encounter (Signed)
See result note-pt advised of lab result

## 2019-04-19 NOTE — Telephone Encounter (Signed)
Pt returned a nurse's call. Pls call her again.

## 2019-04-19 NOTE — Patient Instructions (Signed)
Description   Take 1.5 tablets today, then continue on same dosage 1/2 tablet daily except 1 tablet on Tuesdays and Saturdays.  Recheck in 4 weeks.  Call with new medications or any procedures  [phone].

## 2019-04-26 ENCOUNTER — Other Ambulatory Visit: Payer: Medicare Other

## 2019-04-26 ENCOUNTER — Ambulatory Visit (INDEPENDENT_AMBULATORY_CARE_PROVIDER_SITE_OTHER): Payer: Medicare Other | Admitting: Gastroenterology

## 2019-04-26 ENCOUNTER — Encounter: Payer: Self-pay | Admitting: Gastroenterology

## 2019-04-26 VITALS — BP 144/84 | HR 84 | Temp 97.4°F | Ht 62.0 in | Wt 177.1 lb

## 2019-04-26 DIAGNOSIS — K743 Primary biliary cirrhosis: Secondary | ICD-10-CM

## 2019-04-26 NOTE — Patient Instructions (Signed)
Your provider has requested that you go to the basement level for lab work before leaving today. Press "B" on the elevator. The lab is located at the first door on the left as you exit the elevator.  You have been scheduled for an abdominal ultrasound at [facility] (1st floor of hospital) on 05/02/19 at 8:30am. Please arrive 15 minutes prior to your appointment for registration. Make certain not to have anything to eat or drink 6 hours prior to your appointment. Should you need to reschedule your appointment, please contact radiology at [phone]. This test typically takes about 30 minutes to perform.  We will contact in 6 months for repeat lab work and ultrasound.   Normal BMI (Body Mass Index- based on height and weight) is between 23 and 30. Your BMI today is Body mass index is 32.4 kg/m. Marland Kitchen Please consider follow up  regarding your BMI with your Primary Care Provider.  Thank you for choosing me and [facility].  Pricilla Riffle. Dagoberto Ligas., MD., Marval Regal

## 2019-04-26 NOTE — Progress Notes (Signed)
    History of Present Illness: This is an 84 year old female with PBC.  No gastrointestinal complaints.  04/14/2019 blood work: LFTs normal, platelets normal, mild normocytic anemia stable, INR elevated on warfarin.   Current Medications, Allergies, Past Medical History, Past Surgical History, Family History and Social History were reviewed in [employer] record.   Physical Exam: General: Well developed, well nourished, no acute distress Head: Normocephalic and atraumatic Eyes:  sclerae anicteric, EOMI Ears: Normal auditory acuity Mouth: No deformity or lesions Lungs: Clear throughout to auscultation Heart: Regular rate and rhythm; no murmurs, rubs or bruits Abdomen: Soft, non tender and non distended. No masses, hepatosplenomegaly or hernias noted. Normal Bowel sounds Rectal: Not done Musculoskeletal: Symmetrical with no gross deformities  Pulses:  Normal pulses noted Extremities: No clubbing, cyanosis, edema or deformities noted Neurological: Alert oriented x 4, grossly nonfocal Psychological:  Alert and cooperative. Normal mood and affect   Assessment and Recommendations:  1.  PBC, stable. no evidence of decompensation. RUQ [place] now in repeat in 6 months for [facility] screening. AFP, HBsAb, HAV IgG now. CMP, CBC, AFP every 6 months and REV in 1 year.

## 2019-04-27 ENCOUNTER — Other Ambulatory Visit: Payer: Self-pay

## 2019-04-27 LAB — HEPATITIS B SURFACE ANTIBODY,QUALITATIVE: Hep B S Ab: NONREACTIVE

## 2019-04-27 LAB — HEPATITIS A ANTIBODY, TOTAL: Hepatitis A AB,Total: NONREACTIVE

## 2019-04-27 LAB — AFP TUMOR MARKER: AFP-Tumor Marker: 1.2 ng/mL

## 2019-05-02 ENCOUNTER — Ambulatory Visit (HOSPITAL_COMMUNITY): Payer: Medicare Other

## 2019-05-03 ENCOUNTER — Ambulatory Visit
Admission: RE | Admit: 2019-05-03 | Discharge: 2019-05-03 | Disposition: A | Discharge status: Home / Self Care | Payer: Medicare Other | Source: Ambulatory Visit | Attending: Family Medicine | Admitting: Family Medicine

## 2019-05-03 ENCOUNTER — Ambulatory Visit (INDEPENDENT_AMBULATORY_CARE_PROVIDER_SITE_OTHER): Payer: Medicare Other | Admitting: Gastroenterology

## 2019-05-03 ENCOUNTER — Ambulatory Visit (HOSPITAL_COMMUNITY): Payer: Medicare Other

## 2019-05-03 ENCOUNTER — Other Ambulatory Visit: Payer: Self-pay

## 2019-05-03 DIAGNOSIS — Z1231 Encounter for screening mammogram for malignant neoplasm of breast: Secondary | ICD-10-CM

## 2019-05-03 DIAGNOSIS — Z23 Encounter for immunization: Secondary | ICD-10-CM | POA: Diagnosis not present

## 2019-05-03 LAB — HM MAMMOGRAPHY

## 2019-05-04 ENCOUNTER — Ambulatory Visit (HOSPITAL_COMMUNITY): Payer: Medicare Other

## 2019-05-10 ENCOUNTER — Ambulatory Visit (HOSPITAL_COMMUNITY)
Admission: RE | Admit: 2019-05-10 | Discharge: 2019-05-10 | Disposition: A | Discharge status: Home / Self Care | Payer: Medicare Other | Source: Ambulatory Visit | Attending: Gastroenterology | Admitting: Gastroenterology

## 2019-05-10 ENCOUNTER — Ambulatory Visit (INDEPENDENT_AMBULATORY_CARE_PROVIDER_SITE_OTHER): Payer: Medicare Other | Admitting: Gastroenterology

## 2019-05-10 ENCOUNTER — Other Ambulatory Visit: Payer: Self-pay

## 2019-05-10 DIAGNOSIS — K743 Primary biliary cirrhosis: Secondary | ICD-10-CM | POA: Diagnosis present

## 2019-05-10 DIAGNOSIS — Z23 Encounter for immunization: Secondary | ICD-10-CM

## 2019-05-17 ENCOUNTER — Other Ambulatory Visit: Payer: Self-pay

## 2019-05-17 ENCOUNTER — Ambulatory Visit (INDEPENDENT_AMBULATORY_CARE_PROVIDER_SITE_OTHER): Payer: Medicare Other | Admitting: Pharmacist

## 2019-05-17 DIAGNOSIS — I480 Paroxysmal atrial fibrillation: Secondary | ICD-10-CM | POA: Diagnosis not present

## 2019-05-17 DIAGNOSIS — Z5181 Encounter for therapeutic drug level monitoring: Secondary | ICD-10-CM

## 2019-05-17 LAB — POCT INR: INR: 2 (ref 2.0–3.0)

## 2019-05-31 ENCOUNTER — Telehealth: Payer: Self-pay | Admitting: Gastroenterology

## 2019-05-31 NOTE — Telephone Encounter (Signed)
I spoke with [facility] pharmacist and there is no contraindication to taking the Twinrix vaccine,  Patient notified.  She declines to keep the appt for her 3rd vaccine of Twinrix.  She will call back when she is ready to resume her Twinrix series. She feels the "medical worker at the health department wouldn't have told her that if it weren't true. She is advised that if she does not complete the Twinrix in the recommended vaccine window, she will not be protected from hep A or B.  She verbalized understanding.

## 2019-05-31 NOTE — Telephone Encounter (Signed)
Patient agrees to call back to schedule the 3rd Twinrix 1 month after 2nd Covid vaccine

## 2019-05-31 NOTE — Telephone Encounter (Signed)
Let's ask [name] pharmacy for advice on timing of 3rd Twinrix.

## 2019-05-31 NOTE — Telephone Encounter (Signed)
Pt would like to speak with you about hep AB shots that she usually gets and Covid vaccine. She stated that she got her first dose of Covid vaccine last Saturday 1/16 and has to get the second dose in three weeks. She needs to get her AB shot in between the Covid vaccine, she wants to know if she should postpone her Hep AB shots or not.

## 2019-05-31 NOTE — Telephone Encounter (Signed)
Please encourage her to have 3rd Twinrix about 1 month after she receives the second SARS-CoV-2 dose.

## 2019-05-31 NOTE — Telephone Encounter (Signed)
Patient was told to not get any other vaccines with the COVID vaccine.  She had her COVID vaccine today and is due for her 3rd Twinrix next week.  Please advise if she should postpone her next Twinrix

## 2019-06-21 ENCOUNTER — Ambulatory Visit (INDEPENDENT_AMBULATORY_CARE_PROVIDER_SITE_OTHER): Payer: Medicare Other | Admitting: Pharmacist Clinician (PhC)/ Clinical Pharmacy Specialist

## 2019-06-21 ENCOUNTER — Other Ambulatory Visit: Payer: Self-pay

## 2019-06-21 DIAGNOSIS — I480 Paroxysmal atrial fibrillation: Secondary | ICD-10-CM | POA: Diagnosis not present

## 2019-06-21 DIAGNOSIS — Z5181 Encounter for therapeutic drug level monitoring: Secondary | ICD-10-CM | POA: Diagnosis not present

## 2019-06-21 LAB — POCT INR: INR: 2.1 (ref 2.0–3.0)

## 2019-06-21 NOTE — Patient Instructions (Signed)
Continue on same dosage 1/2 tablet daily except 1 tablet on Tuesdays and Saturdays.  Recheck in 5 weeks. Call with new medications or any procedures  [phone].

## 2019-07-02 ENCOUNTER — Other Ambulatory Visit: Payer: Self-pay | Admitting: Family Medicine

## 2019-07-02 ENCOUNTER — Other Ambulatory Visit: Payer: Self-pay | Admitting: Gastroenterology

## 2019-07-05 ENCOUNTER — Other Ambulatory Visit: Payer: Self-pay | Admitting: Family Medicine

## 2019-07-23 ENCOUNTER — Other Ambulatory Visit: Payer: Self-pay | Admitting: Family Medicine

## 2019-07-25 ENCOUNTER — Other Ambulatory Visit: Payer: Self-pay

## 2019-07-25 ENCOUNTER — Encounter: Payer: Self-pay | Admitting: Internal Medicine

## 2019-07-25 ENCOUNTER — Encounter (INDEPENDENT_AMBULATORY_CARE_PROVIDER_SITE_OTHER): Payer: Self-pay

## 2019-07-25 ENCOUNTER — Ambulatory Visit (INDEPENDENT_AMBULATORY_CARE_PROVIDER_SITE_OTHER): Payer: Medicare Other | Admitting: Internal Medicine

## 2019-07-25 VITALS — BP 138/90 | HR 95 | Ht 62.0 in | Wt 179.0 lb

## 2019-07-25 DIAGNOSIS — I4821 Permanent atrial fibrillation: Secondary | ICD-10-CM

## 2019-07-25 NOTE — Progress Notes (Signed)
Patient Care Team: Rochel Brome, MD as PCP - General (Family Medicine) [name], MD as PCP - Cardiology (Cardiology) [name], MD as Consulting Physician (Gastroenterology) [name], MD as Consulting Physician (Cardiology)   HPI  Sara Hancock is a 85 y.o. female seen in followup for atrial fibrillation that is paroxysmal. It occurs in the setting of background bradycardia and mild first-degree AV block. She is doing quite well without recurrent tachypalpitations.   She is tolerating her rhythmol  She has noted her HR is faster over the last 2 months 80-90s vs 60-70s  She has noted no change in functional status  NO prior cardioversion       DATE PR interval QRSduration Dose  9/15 200 98 325  11/17 200 96 325  11/18 244 104 325  2/19 204 104         Date Cr K Hgb  10/18   10.4   4/19   10.4  12/20 1.34 4.1 11.9     Past Medical History:  Diagnosis Date  . Anemia   . Arthritis   . Biliary cirrhosis ([place])   . Cholelithiasis 01/09/12  . Diverticulosis of colon with hemorrhage 06/08/2000  . Esophageal stenosis 01/08/11  . Esophagitis 01/08/11  . GERD (gastroesophageal reflux disease)   . Hiatal hernia 01/08/11  . HTN (hypertension)   . Hx of adenomatous colonic polyps 01/08/11  . Hyperplastic colon polyp 06/08/2000, 07/31/2003, 01/08/11  . Obesity   . Osteopenia   . PAF (paroxysmal atrial fibrillation) ([facility])    occurring in the setting of bradycardia and mild first degree AV block followed by Dr. Jens Som  . Pure hypercholesterolemia   . Type II or unspecified type diabetes mellitus without mention of complication, not stated as uncontrolled   . Uterine fibroid 01/09/12    Past Surgical History:  Procedure Laterality Date  . BREAST CYST EXCISION     left  . CARDIOVERSION N/A 11/27/2017   Procedure: CARDIOVERSION;  Surgeon: Sanda Jayde Mcallister, MD;  Location: [facility];  Service: Cardiovascular;  Laterality: N/A;  . COLONOSCOPY    . INSERTION  OF MESH  05/18/2012   Procedure: INSERTION OF MESH;  Surgeon: Adin Hector, MD;  Location: [facility];  Service: General;  Laterality: N/A;  . LIVER BIOPSY  05/18/2012   Procedure: LIVER BIOPSY;  Surgeon: Adin Hector, MD;  Location: [facility];  Service: General;;  . POLYPECTOMY    . THYROIDECTOMY, PARTIAL  1980  . TUBAL LIGATION  1972  . VENTRAL HERNIA REPAIR  05/18/2012   Procedure: LAPAROSCOPIC VENTRAL HERNIA;  Surgeon: Adin Hector, MD;  Location: [facility];  Service: General;  Laterality: N/A;  Laparoscopic Ventral Wall Hernia with Mesh    Current Outpatient Medications  Medication Sig Dispense Refill  . Accu-Chek FastClix Lancets MISC USE AS DIRECTED TO CHECK BLOOD SUGAR TWICE DAILY 204 each 3  . calcium-vitamin D (OSCAL WITH D) 250-125 MG-UNIT per tablet Take 1 tablet by mouth daily.    . Cholecalciferol (VITAMIN D3) 1000 units CAPS Take 1,000 Units by mouth daily.    . Ferrous Sulfate (IRON) 325 (65 FE) MG TABS Take 325 mg by mouth daily. 30 each 0  . fexofenadine (ALLEGRA) 180 MG tablet Take 180 mg by mouth daily.     . folic acid (FOLVITE) 893 MCG tablet Take 400 mcg by mouth daily.    . furosemide (LASIX) 40 MG tablet TAKE 1 TABLET BY MOUTH TWICE DAILY 180 tablet  1  . hydrALAZINE (APRESOLINE) 100 MG tablet Take 1 tablet by mouth 2 (two) times daily.  1  . insulin detemir (LEVEMIR FLEXPEN) 100 UNIT/ML injection Inject into the skin as directed. 18 units at bedtime and 32 units in the a.m.    . irbesartan (AVAPRO) 300 MG tablet TAKE 1 TABLET BY MOUTH DAILY 90 tablet 1  . JANUMET 50-1000 MG tablet TAKE 1 TABLET BY MOUTH TWICE DAILY WITH MEALS 180 tablet 1  . ketoconazole (NIZORAL) 2 % cream Apply 1 application topically daily as needed. Around skin folds for irritation    . Metoprolol Tartrate 75 MG TABS TAKE 1 TABLET(75 MG) BY MOUTH TWICE DAILY 180 tablet 3  . Multiple Vitamin (MULTIVITAMIN WITH MINERALS) TABS Take 1 tablet by mouth daily.    . pantoprazole (PROTONIX) 40 MG tablet TAKE 1  TABLET(40 MG) BY MOUTH DAILY 90 tablet 3  . simvastatin (ZOCOR) 40 MG tablet TAKE 1 TABLET BY MOUTH AT BEDTIME 90 tablet 1  . ursodiol (ACTIGALL) 300 MG capsule TAKE 1 CAPSULE(300 MG) BY MOUTH TWICE DAILY 180 capsule 0  . vitamin B-12 (CYANOCOBALAMIN) 1000 MCG tablet Take 1,000 mcg by mouth daily.    . vitamin C (ASCORBIC ACID) 500 MG tablet Take 500 mg by mouth daily.    . vitamin E 400 UNIT capsule Take 400 Units by mouth daily.    Marland Kitchen warfarin (COUMADIN) 5 MG tablet TAKE AS DIRECTED BY COUMADIN CLINIC 90 tablet 1   No current facility-administered medications for this visit.    Allergies  Allergen Reactions  . Ace Inhibitors Anaphylaxis  . Capoten [Captopril] Anaphylaxis    Throat swells shut  . Neomycin-Bacitracin Zn-Polymyx Rash    Review of Systems negative except from HPI and PMH  Physical Exam BP 138/90   Pulse 95   Ht 5\' 2"  (1.575 m)   Wt 179 lb (81.2 kg)   SpO2 98%   BMI 32.74 kg/m  Well developed and nourished in no acute distress HENT normal Neck supple with JVP-flat Carotids brisk and full without bruits Clear Irregularly irregular rate and rhythm with controlled  ventricular response, no murmurs or gallops Abd-soft with active BS without hepatomegaly No Clubbing cyanosis edema Skin-warm and dry A & Oriented  Grossly normal sensory and motor function   ECG  Afib 945 -08/35  Assessment and  Plan  Atrial fibrillation-permanent   Hypertension  Abnormal ECG  Anemia- chronic    Blood pressure well controlled  No bleeding on Coumadin.  Hemoglobin in fact much better.  Atrial fibrillation without symptoms.  If need be Avapro can be down titrated metoprolol uptitrated if evidence of rapid heart rates ensue.

## 2019-07-25 NOTE — Patient Instructions (Signed)
Medication Instructions:  Your physician recommends that you continue on your current medications as directed. Please refer to the Current Medication list given to you today. *If you need a refill on your cardiac medications before your next appointment, please call your pharmacy*   Lab Work: None ordered.  If you have labs (blood work) drawn today and your tests are completely normal, you will receive your results only by: Marland Kitchen MyChart Message (if you have MyChart) OR . A paper copy in the mail If you have any lab test that is abnormal or we need to change your treatment, we will call you to review the results.   Testing/Procedures: None ordered.    Follow-Up: At [facility], you and your health needs are our priority.  As part of our continuing mission to provide you with exceptional heart care, we have created designated Provider Care Teams.  These Care Teams include your primary Cardiologist (physician) and Advanced Practice Providers (APPs -  Physician Assistants and Nurse Practitioners) who all work together to provide you with the care you need, when you need it.  We recommend signing up for the patient portal called "MyChart".  Sign up information is provided on this After Visit Summary.  MyChart is used to connect with patients for Virtual Visits (Telemedicine).  Patients are able to view lab/test results, encounter notes, upcoming appointments, etc.  Non-urgent messages can be sent to your provider as well.   To learn more about what you can do with MyChart, go to [email or web address].    Your next appointment:   12 months  The format for your next appointment:   In person  Provider:   Dr Caryl Comes

## 2019-08-01 ENCOUNTER — Telehealth: Payer: Self-pay | Admitting: Gastroenterology

## 2019-08-01 DIAGNOSIS — N959 Unspecified menopausal and perimenopausal disorder: Secondary | ICD-10-CM | POA: Diagnosis not present

## 2019-08-01 DIAGNOSIS — M810 Age-related osteoporosis without current pathological fracture: Secondary | ICD-10-CM | POA: Diagnosis not present

## 2019-08-01 LAB — HM DEXA SCAN

## 2019-08-01 NOTE — Telephone Encounter (Signed)
Patient wants to resume her Twinrix shots.  She was due for Twinrix #3 in January.  She understands that she is off schedule, but can continue with her series.  She will come in on 08/08/19 at 10:30 to get #3

## 2019-08-02 ENCOUNTER — Other Ambulatory Visit: Payer: Self-pay

## 2019-08-02 ENCOUNTER — Ambulatory Visit (INDEPENDENT_AMBULATORY_CARE_PROVIDER_SITE_OTHER): Payer: Medicare Other | Admitting: Pharmacist Clinician (PhC)/ Clinical Pharmacy Specialist

## 2019-08-02 DIAGNOSIS — Z5181 Encounter for therapeutic drug level monitoring: Secondary | ICD-10-CM | POA: Diagnosis not present

## 2019-08-02 DIAGNOSIS — I480 Paroxysmal atrial fibrillation: Secondary | ICD-10-CM

## 2019-08-02 LAB — POCT INR: INR: 1.7 — AB (ref 2.0–3.0)

## 2019-08-02 NOTE — Patient Instructions (Signed)
Take 1.5 tablets (7.5 mg) today Tuesday March 23, then continue with 1/2 tablet daily except 1 tablet on Tuesdays and Saturdays.  Recheck in 3 weeks. Call with new medications or any procedures  [phone].

## 2019-08-08 ENCOUNTER — Ambulatory Visit (INDEPENDENT_AMBULATORY_CARE_PROVIDER_SITE_OTHER): Payer: Medicare Other | Admitting: Gastroenterology

## 2019-08-08 DIAGNOSIS — Z23 Encounter for immunization: Secondary | ICD-10-CM | POA: Diagnosis not present

## 2019-08-10 ENCOUNTER — Encounter: Payer: Self-pay | Admitting: Family Medicine

## 2019-08-10 ENCOUNTER — Encounter: Payer: Self-pay | Admitting: Nurse Practitioner

## 2019-08-10 ENCOUNTER — Ambulatory Visit (INDEPENDENT_AMBULATORY_CARE_PROVIDER_SITE_OTHER): Payer: Medicare Other | Admitting: Nurse Practitioner

## 2019-08-10 ENCOUNTER — Other Ambulatory Visit: Payer: Self-pay

## 2019-08-10 VITALS — BP 138/92 | HR 90 | Temp 97.7°F | Ht 61.0 in | Wt 177.8 lb

## 2019-08-10 DIAGNOSIS — I10 Essential (primary) hypertension: Secondary | ICD-10-CM | POA: Diagnosis not present

## 2019-08-10 NOTE — Assessment & Plan Note (Addendum)
Patients blood pressure is not well controlled. Blood pressure elevated prior to medication administration. Provided education on taking blood pressure medication as prescribed. reviewed current medications. Education provided on reducing salt in diet. One week blood pressure log is recommended  to monitor blood pressure elevation and follow up nurse visit to recheck blood pressure.  Patient knows to call if blood pressure continues to be uncontrolled.   Blood pressure reading repeated before completing office visit. Results documented in chart under vitals.  Patient reminded to Bring Blood pressure machine on next visit to be checked.

## 2019-08-10 NOTE — Patient Instructions (Addendum)
Essential hypertension Blood pressure elevated prior to medication administration. Provided education on taking blood pressure medication as prescribed. reviewed current medications. Education provided on reducing salt in diet. One week blood pressure log is recommended  to monitor blood pressure elevation and follow up nurse visit to recheck blood pressure.  Patient knows to call if blood pressure is uncontrolled.   Blood pressure reading repeated before office visit is completed. Results documented in chat vitals. Bring blood pressure machine on next visit.   Follow up as needed for uncontrolled Blood pressure.    Hypertension, Adult Hypertension is another name for high blood pressure. High blood pressure forces your heart to work harder to pump blood. This can cause problems over time. There are two numbers in a blood pressure reading. There is a top number (systolic) over a bottom number (diastolic). It is best to have a blood pressure that is below 120/80. Healthy choices can help lower your blood pressure, or you may need medicine to help lower it. What are the causes? The cause of this condition is not known. Some conditions may be related to high blood pressure. What increases the risk?  Smoking.  Having type 2 diabetes mellitus, high cholesterol, or both.  Not getting enough exercise or physical activity.  Being overweight.  Having too much fat, sugar, calories, or salt (sodium) in your diet.  Drinking too much alcohol.  Having long-term (chronic) kidney disease.  Having a family history of high blood pressure.  Age. Risk increases with age.  Race. You may be at higher risk if you are African American.  Gender. Men are at higher risk than women before age 99. After age 60, women are at higher risk than men.  Having obstructive sleep apnea.  Stress. What are the signs or symptoms?  High blood pressure may not cause symptoms. Very high blood pressure (hypertensive  crisis) may cause: ? Headache. ? Feelings of worry or nervousness (anxiety). ? Shortness of breath. ? Nosebleed. ? A feeling of being sick to your stomach (nausea). ? Throwing up (vomiting). ? Changes in how you see. ? Very bad chest pain. ? Seizures. How is this treated?  This condition is treated by making healthy lifestyle changes, such as: ? Eating healthy foods. ? Exercising more. ? Drinking less alcohol.  Your health care provider may prescribe medicine if lifestyle changes are not enough to get your blood pressure under control, and if: ? Your top number is above 130. ? Your bottom number is above 80.  Your personal target blood pressure may vary. Follow these instructions at home: Eating and drinking   If told, follow the DASH eating plan. To follow this plan: ? Fill one half of your plate at each meal with fruits and vegetables. ? Fill one fourth of your plate at each meal with whole grains. Whole grains include whole-wheat pasta, brown rice, and whole-grain bread. ? Eat or drink low-fat dairy products, such as skim milk or low-fat yogurt. ? Fill one fourth of your plate at each meal with low-fat (lean) proteins. Low-fat proteins include fish, chicken without skin, eggs, beans, and tofu. ? Avoid fatty meat, cured and processed meat, or chicken with skin. ? Avoid pre-made or processed food.  Eat less than 1,500 mg of salt each day.  Do not drink alcohol if: ? Your doctor tells you not to drink. ? You are pregnant, may be pregnant, or are planning to become pregnant.  If you drink alcohol: ? Limit how much  you use to:  0-1 drink a day for women.  0-2 drinks a day for men. ? Be aware of how much alcohol is in your drink. In the [place] one drink equals one 12 oz bottle of beer (355 mL), one 5 oz glass of wine (148 mL), or one 1 oz glass of hard liquor (44 mL). Lifestyle   Work with your doctor to stay at a healthy weight or to lose weight. Ask your doctor what  the best weight is for you.  Get at least 30 minutes of exercise most days of the week. This may include walking, swimming, or biking.  Get at least 30 minutes of exercise that strengthens your muscles (resistance exercise) at least 3 days a week. This may include lifting weights or doing Pilates.  Do not use any products that contain nicotine or tobacco, such as cigarettes, e-cigarettes, and chewing tobacco. If you need help quitting, ask your doctor.  Check your blood pressure at home as told by your doctor.  Keep all follow-up visits as told by your doctor. This is important. Medicines  Take over-the-counter and prescription medicines only as told by your doctor. Follow directions carefully.  Do not skip doses of blood pressure medicine. The medicine does not work as well if you skip doses. Skipping doses also puts you at risk for problems.  Ask your doctor about side effects or reactions to medicines that you should watch for. Contact a doctor if you:  Think you are having a reaction to the medicine you are taking.  Have headaches that keep coming back (recurring).  Feel dizzy.  Have swelling in your ankles.  Have trouble with your vision. Get help right away if you:  Get a very bad headache.  Start to feel mixed up (confused).  Feel weak or numb.  Feel faint.  Have very bad pain in your: ? Chest. ? Belly (abdomen).  Throw up more than once.  Have trouble breathing. Summary  Hypertension is another name for high blood pressure.  High blood pressure forces your heart to work harder to pump blood.  For most people, a normal blood pressure is less than 120/80.  Making healthy choices can help lower blood pressure. If your blood pressure does not get lower with healthy choices, you may need to take medicine. This information is not intended to replace advice given to you by your health care provider. Make sure you discuss any questions you have with your health  care provider. Document Revised: 01/06/2018 Document Reviewed: 01/06/2018 Elsevier Patient Education  2020 [employer].

## 2019-08-10 NOTE — Progress Notes (Addendum)
Established Patient Office Visit  Subjective:  Patient ID: Sara Hancock, female    DOB: 03-04-1935  Age: 85 y.o. MRN: [id number]  CC: Patient  is a 85 old female. She is here for hypertension The patient's medications were reviewed and reconciled since the patient's last visit.  History details were provided by the patient. The history appears to be reliable.     Chief Complaint  Patient presents with  . Hypertension    Patient had high BP reading at home of 166/104    HPI Sara Hancock presents with hypertension. Patient was diagnosed over 15 years ago.  Current medication; lasix 40 mg twice daily, Hydralazine 100 mg twice a day, irbesartan 300 mg, metoprolol 75 mg .The patient is tolerating the medication well without side effects. Compliance with treatment has been good; including taking medication as directed , maintains a healthy diet and regular exercise regimen,  and following up as directed.   Past Medical History:  Diagnosis Date  . Anemia   . Arthritis   . Biliary cirrhosis ([place])   . Cholelithiasis 01/09/12  . Chronic kidney disease, stage 3   . Diverticulosis of colon with hemorrhage 06/08/2000  . Esophageal stenosis 01/08/11  . Esophagitis 01/08/11  . GERD (gastroesophageal reflux disease)   . Hiatal hernia 01/08/11  . HTN (hypertension)   . Hx of adenomatous colonic polyps 01/08/11  . Hyperplastic colon polyp 06/08/2000, 07/31/2003, 01/08/11  . 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 Localized edema   . Long term (current) use of insulin ([place])   . Mixed hyperlipidemia   . Obesity   . Osteopenia   . Other iron deficiency anemias   . Other specified menopausal and perimenopausal disorders   . PAF (paroxysmal atrial fibrillation) (HCC)    occurring in the setting of bradycardia and mild first degree AV block followed by Dr. Jens Som  . Pain  in thoracic spine   . Pure hypercholesterolemia   . Type II or unspecified type diabetes mellitus without mention of complication, not stated as uncontrolled   . Uterine fibroid 01/09/12    Past Surgical History:  Procedure Laterality Date  . BREAST CYST EXCISION     left  . CARDIOVERSION N/A 11/27/2017   Procedure: CARDIOVERSION;  Surgeon: Sanda Klein, MD;  [facility];  Service: Cardiovascular;  Laterality: N/A;  . COLONOSCOPY    . INSERTION OF MESH  05/18/2012   Procedure: INSERTION OF MESH;  Surgeon: Adin Hector, MD;  Location: [facility];  Service: General;  Laterality: N/A;  . LIVER BIOPSY  05/18/2012   Procedure: LIVER BIOPSY;  Surgeon: Adin Hector, MD;  Location: [facility];  Service: General;;  . POLYPECTOMY    . THYROIDECTOMY, PARTIAL  1980  . TUBAL LIGATION  1972  . VENTRAL HERNIA REPAIR  05/18/2012   Procedure: LAPAROSCOPIC VENTRAL HERNIA;  Surgeon: Adin Hector, MD;  Location: [facility];  Service: General;  Laterality: N/A;  Laparoscopic Ventral Wall Hernia with Mesh    Family History  Problem Relation Age of Onset  . Heart attack Father   . Stroke Mother   . Diabetes Sister        x 2  . Aneurysm Sister   . Stroke Sister   . Congestive Heart Failure Sister   . Diabetes Brother   . Kidney failure Other   . Hyperlipidemia  Other   . Arrhythmia Other   . Congestive Heart Failure Other   . Hypertension Other   . Arthritis Other   . Colon cancer Neg Hx   . Esophageal cancer Neg Hx   . Stomach cancer Neg Hx   . Rectal cancer Neg Hx     Social History   Socioeconomic History  . Marital status: Married    Spouse name: Not on file  . Number of children: 2  . Years of education: Not on file  . Highest education level: Not on file  Occupational History  . Occupation: retired    Fish farm manager: RETIRED  Tobacco Use  . Smoking status: Never Smoker  . Smokeless tobacco: Never Used  Substance and Sexual Activity  . Alcohol use: No  . Drug use: No  .  Sexual activity: Not Currently  Other Topics Concern  . Not on file  Social History Narrative  . Not on file   Social Determinants of Health   Financial Resource Strain:   . Difficulty of Paying Living Expenses:   Food Insecurity:   . Worried About Charity fundraiser in the Last Year:   . Arboriculturist in the Last Year:   Transportation Needs:   . Film/video editor (Medical):   Marland Kitchen Lack of Transportation (Non-Medical):   Physical Activity:   . Days of Exercise per Week:   . Minutes of Exercise per Session:   Stress:   . Feeling of Stress :   Social Connections:   . Frequency of Communication with Friends and Family:   . Frequency of Social Gatherings with Friends and Family:   . Attends Religious Services:   . Active Member of Clubs or Organizations:   . Attends Archivist Meetings:   Marland Kitchen Marital Status:   Intimate Partner Violence:   . Fear of Current or Ex-Partner:   . Emotionally Abused:   Marland Kitchen Physically Abused:   . Sexually Abused:     Outpatient Medications Prior to Visit  Medication Sig Dispense Refill  . ACCU-CHEK AVIVA PLUS test strip USE TO TEST TWICE DAILY    . Accu-Chek FastClix Lancets MISC USE AS DIRECTED TO CHECK BLOOD SUGAR TWICE DAILY 204 each 3  . calcium-vitamin D (OSCAL WITH D) 250-125 MG-UNIT per tablet Take 1 tablet by mouth daily.    . Cholecalciferol (VITAMIN D3) 1000 units CAPS Take 1,000 Units by mouth daily.    . Ferrous Sulfate (IRON) 325 (65 FE) MG TABS Take 325 mg by mouth daily. 30 each 0  . fexofenadine (ALLEGRA) 180 MG tablet Take 180 mg by mouth daily.     . fluticasone (FLONASE) 50 MCG/ACT nasal spray Place into both nostrils daily.    . folic acid (FOLVITE) 169 MCG tablet Take 400 mcg by mouth daily.    . furosemide (LASIX) 40 MG tablet TAKE 1 TABLET BY MOUTH TWICE DAILY 180 tablet 1  . insulin detemir (LEVEMIR FLEXPEN) 100 UNIT/ML injection Inject into the skin as directed. 18 units at bedtime and 32 units in the a.m.    .  irbesartan (AVAPRO) 300 MG tablet TAKE 1 TABLET BY MOUTH DAILY 90 tablet 1  . JANUMET 50-1000 MG tablet TAKE 1 TABLET BY MOUTH TWICE DAILY WITH MEALS 180 tablet 1  . ketoconazole (NIZORAL) 2 % cream Apply 1 application topically daily as needed. Around skin folds for irritation    . Multiple Vitamin (MULTIVITAMIN WITH MINERALS) TABS Take 1 tablet by mouth daily.    Marland Kitchen  pantoprazole (PROTONIX) 40 MG tablet TAKE 1 TABLET(40 MG) BY MOUTH DAILY 90 tablet 3  . simvastatin (ZOCOR) 40 MG tablet TAKE 1 TABLET BY MOUTH AT BEDTIME 90 tablet 1  . ursodiol (ACTIGALL) 300 MG capsule TAKE 1 CAPSULE(300 MG) BY MOUTH TWICE DAILY 180 capsule 0  . vitamin B-12 (CYANOCOBALAMIN) 1000 MCG tablet Take 1,000 mcg by mouth daily.    . vitamin C (ASCORBIC ACID) 500 MG tablet Take 500 mg by mouth daily.    . vitamin E 400 UNIT capsule Take 400 Units by mouth daily.    Marland Kitchen warfarin (COUMADIN) 5 MG tablet TAKE AS DIRECTED BY COUMADIN CLINIC 90 tablet 1  . fluticasone (FLONASE SENSIMIST) 27.5 MCG/SPRAY nasal spray Place 2 sprays into the nose daily.    . hydrALAZINE (APRESOLINE) 100 MG tablet Take 1 tablet by mouth 2 (two) times daily.  1  . Metoprolol Tartrate 75 MG TABS TAKE 1 TABLET(75 MG) BY MOUTH TWICE DAILY 180 tablet 3   No facility-administered medications prior to visit.    Allergies  Allergen Reactions  . Ace Inhibitors Anaphylaxis  . Capoten [Captopril] Anaphylaxis    Throat swells shut  . Neomycin-Bacitracin Zn-Polymyx Rash    ROS Review of Systems  Constitutional: Negative for activity change, appetite change and fatigue.  HENT: Negative for congestion.   Eyes: Positive for redness.  Respiratory: Negative for cough, chest tightness and shortness of breath.   Cardiovascular: Negative for chest pain and palpitations.  Genitourinary: Negative for difficulty urinating.  Musculoskeletal: Negative for arthralgias and myalgias.  Skin: Negative for rash.  Neurological: Negative for light-headedness and  headaches.  Psychiatric/Behavioral: Negative for confusion.      Objective:    Physical Exam  Constitutional: She is oriented to person, place, and time. She appears well-developed and well-nourished.  HENT:  Mouth/Throat: Oropharynx is clear and moist.  Eyes: Conjunctivae are normal.  Cardiovascular: Normal rate, regular rhythm and normal heart sounds.  Pulmonary/Chest: Effort normal and breath sounds normal. She exhibits no tenderness.  Abdominal: Bowel sounds are normal.  Musculoskeletal:        General: No tenderness. Normal range of motion.     Cervical back: Neck supple.  Neurological: She is alert and oriented to person, place, and time.  Skin: Skin is warm. No rash noted.  Psychiatric: Judgment normal.    BP (!) 138/92   Pulse 90   Temp 97.7 F (36.5 C) (Temporal)   Ht 5\' 1"  (1.549 m)   Wt 177 lb 12.8 oz (80.6 kg)   SpO2 98%   BMI 33.60 kg/m  Wt Readings from Last 3 Encounters:  08/18/19 175 lb 9.6 oz (79.7 kg)  08/10/19 177 lb 12.8 oz (80.6 kg)  07/25/19 179 lb (81.2 kg)     Health Maintenance Due  Topic Date Due  . FOOT EXAM  Never done  . OPHTHALMOLOGY EXAM  Never done  . COVID-19 Vaccine (1) Never done  . HEMOGLOBIN A1C  07/01/2014    There are no preventive care reminders to display for this patient.  Lab Results  Component Value Date   TSH 1.67 02/26/2017   Lab Results  Component Value Date   WBC 5.9 04/14/2019   HGB 11.9 (L) 04/14/2019   HCT 35.6 (L) 04/14/2019   MCV 92.8 04/14/2019   PLT 204.0 04/14/2019   Lab Results  Component Value Date   NA 141 04/14/2019   K 4.1 04/14/2019   CO2 28 04/14/2019   GLUCOSE 198 (H) 04/14/2019  BUN 39 (H) 04/14/2019   CREATININE 1.34 (H) 04/14/2019   BILITOT 0.5 04/14/2019   ALKPHOS 103 04/14/2019   AST 23 04/14/2019   ALT 20 04/14/2019   PROT 7.2 04/14/2019   ALBUMIN 4.0 04/14/2019   CALCIUM 10.0 04/14/2019   GFR 37.83 (L) 04/14/2019      Assessment & Plan:  Hypertension associated with  diabetes ([place]) Patients blood pressure is not well controlled. Blood pressure elevated prior to medication administration. Provided education on taking blood pressure medication as prescribed. reviewed current medications. Education provided on reducing salt in diet. One week blood pressure log is recommended  to monitor blood pressure elevation and follow up nurse visit to recheck blood pressure.  Patient knows to call if blood pressure continues to be uncontrolled.   Blood pressure reading repeated before completing office visit. Results documented in chart under vitals.  Patient reminded to Bring Blood pressure machine on next visit to be checked.  Problem List Items Addressed This Visit      Cardiovascular and Mediastinum   Hypertension associated with diabetes ([place]) - Primary    Patients blood pressure is not well controlled. Blood pressure elevated prior to medication administration. Provided education on taking blood pressure medication as prescribed. reviewed current medications. Education provided on reducing salt in diet. One week blood pressure log is recommended  to monitor blood pressure elevation and follow up nurse visit to recheck blood pressure.  Patient knows to call if blood pressure continues to be uncontrolled.   Blood pressure reading repeated before completing office visit. Results documented in chart under vitals.  Patient reminded to Bring Blood pressure machine on next visit to be checked.         No orders of the defined types were placed in this encounter.   Follow-up: Return if symptoms worsen or fail to improve.    Ivy Lynn, NP

## 2019-08-12 ENCOUNTER — Telehealth: Payer: Self-pay | Admitting: Internal Medicine

## 2019-08-12 ENCOUNTER — Telehealth: Payer: Self-pay

## 2019-08-12 DIAGNOSIS — I10 Essential (primary) hypertension: Secondary | ICD-10-CM | POA: Diagnosis not present

## 2019-08-12 DIAGNOSIS — E119 Type 2 diabetes mellitus without complications: Secondary | ICD-10-CM | POA: Diagnosis not present

## 2019-08-12 DIAGNOSIS — I4891 Unspecified atrial fibrillation: Secondary | ICD-10-CM | POA: Diagnosis not present

## 2019-08-12 DIAGNOSIS — Z79899 Other long term (current) drug therapy: Secondary | ICD-10-CM | POA: Diagnosis not present

## 2019-08-12 LAB — PROTIME-INR

## 2019-08-12 NOTE — Telephone Encounter (Signed)
Patient called and stated that her blood pressure readings are much higher than what they were, she stated that one of her readings are 171/102. Patient also stated that she has took all her medication this morning at least 2 hours before checking her bp. Onyeje was consulted with and she called the patient herself and stated that the patient needed to go to the ER to be evaluated for her bp being so high because she stated there was nothing else we could do. LA

## 2019-08-12 NOTE — Telephone Encounter (Signed)
Spoke with pt who states she saw her PCP last week due to elevated B/P.  Pt reports she has another B/P check next week at B/P office.  Pt states no changes were made last week with medications and she is to keep a log of B/p readings.  Pt states her B/P today was 175/115.  Requested pt retake B/P while on the phone with RN and she received a reading of 171/101  which is fairly consistent t with readings as listed below.  Pt denies CP, SOB, weakness or headache.  Pt states she has had some occasional mild dizziness but not currently.  Pt reports she is taking medications as prescribed.  Pt advised to contact her PCP office today and advise them of today's reading as well as readings over the last 3 days.  Keep B/P check appointment next week as scheduled with PCP unless instructed otherwise by PCP.  Pt advised if she develops CP, SOB, weakness greater on one side, HA or dizziness she will need to go to ED for further evaluation.  Pt verbalizes understanding and states she will contact her PCP.

## 2019-08-12 NOTE — Telephone Encounter (Signed)
New Message  Pt c/o BP issue: STAT if pt c/o blurred vision, one-sided weakness or slurred speech  1. What are your last 5 BP readings? 170/106; 72; 163/99; 89; 164/101; 71; 148/88; 76; 167/101; 75  2. Are you having any other symptoms (ex. Dizziness, headache, blurred vision, passed out)? Dizziness, headache but not severe  3. What is your BP issue? Really high blood pressure! Current BP reading is 175/115; 85

## 2019-08-17 NOTE — Progress Notes (Signed)
Established Patient Office Visit  Subjective:  Patient ID: Sara Hancock, female    DOB: 06-May-1935  Age: 85 y.o. MRN: [id number]  CC:  Chief Complaint  Patient presents with  . Hospitalization Follow-up    HPI Sara Hancock presents for follow up from ED because her bp was worsening. She was asymptomatic, but due to worsening bp (160-170s/90-115) was sent there. Labs reviewed and were stable. EKG was in controlled atrial fibrillation. Her metoprolol tartrate was increased to 100 mg one twice a day. She continues to have be asymptomatic.   Past Medical History:  Diagnosis Date  . Anemia   . Arthritis   . Biliary cirrhosis ([place])   . Cholelithiasis 01/09/12  . Chronic kidney disease, stage 3   . Diverticulosis of colon with hemorrhage 06/08/2000  . Esophageal stenosis 01/08/11  . Esophagitis 01/08/11  . GERD (gastroesophageal reflux disease)   . Hiatal hernia 01/08/11  . HTN (hypertension)   . Hx of adenomatous colonic polyps 01/08/11  . Hyperplastic colon polyp 06/08/2000, 07/31/2003, 01/08/11  . 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 Localized edema   . Long term (current) use of insulin ([place])   . Mixed hyperlipidemia   . Obesity   . Osteopenia   . Other iron deficiency anemias   . Other specified menopausal and perimenopausal disorders   . PAF (paroxysmal atrial fibrillation) ([facility])    occurring in the setting of bradycardia and mild first degree AV block followed by Dr. Jens Som  . Pain in thoracic spine   . Pure hypercholesterolemia   . Type II or unspecified type diabetes mellitus without mention of complication, not stated as uncontrolled   . Uterine fibroid 01/09/12    Past Surgical History:  Procedure Laterality Date  . BREAST CYST EXCISION     left  . CARDIOVERSION N/A 11/27/2017   Procedure: CARDIOVERSION;  Surgeon: Sanda Klein, MD;   [facility];  Service: Cardiovascular;  Laterality: N/A;  . COLONOSCOPY    . INSERTION OF MESH  05/18/2012   Procedure: INSERTION OF MESH;  Surgeon: Adin Hector, MD;  Location: [facility];  Service: General;  Laterality: N/A;  . LIVER BIOPSY  05/18/2012   Procedure: LIVER BIOPSY;  Surgeon: Adin Hector, MD;  Location: [facility];  Service: General;;  . POLYPECTOMY    . THYROIDECTOMY, PARTIAL  1980  . TUBAL LIGATION  1972  . VENTRAL HERNIA REPAIR  05/18/2012   Procedure: LAPAROSCOPIC VENTRAL HERNIA;  Surgeon: Adin Hector, MD;  Location: [facility];  Service: General;  Laterality: N/A;  Laparoscopic Ventral Wall Hernia with Mesh    Family History  Problem Relation Age of Onset  . Heart attack Father   . Stroke Mother   . Diabetes Sister        x 2  . Aneurysm Sister   . Stroke Sister   . Congestive Heart Failure Sister   . Diabetes Brother   . Kidney failure Other   . Hyperlipidemia Other   . Arrhythmia Other   . Congestive Heart Failure Other   . Hypertension Other   . Arthritis Other   . Colon cancer Neg Hx   . Esophageal cancer Neg Hx   . Stomach cancer Neg Hx   . Rectal cancer Neg Hx     Social History   Socioeconomic History  . Marital status: Married  Spouse name: Not on file  . Number of children: 2  . Years of education: Not on file  . Highest education level: Not on file  Occupational History  . Occupation: retired    Fish farm manager: RETIRED  Tobacco Use  . Smoking status: Never Smoker  . Smokeless tobacco: Never Used  Substance and Sexual Activity  . Alcohol use: No  . Drug use: No  . Sexual activity: Not Currently  Other Topics Concern  . Not on file  Social History Narrative  . Not on file   Social Determinants of Health   Financial Resource Strain:   . Difficulty of Paying Living Expenses:   Food Insecurity:   . Worried About Charity fundraiser in the Last Year:   . Arboriculturist in the Last Year:   Transportation Needs:   . Lexicographer (Medical):   Marland Kitchen Lack of Transportation (Non-Medical):   Physical Activity:   . Days of Exercise per Week:   . Minutes of Exercise per Session:   Stress:   . Feeling of Stress :   Social Connections:   . Frequency of Communication with Friends and Family:   . Frequency of Social Gatherings with Friends and Family:   . Attends Religious Services:   . Active Member of Clubs or Organizations:   . Attends Archivist Meetings:   Marland Kitchen Marital Status:   Intimate Partner Violence:   . Fear of Current or Ex-Partner:   . Emotionally Abused:   Marland Kitchen Physically Abused:   . Sexually Abused:     Outpatient Medications Prior to Visit  Medication Sig Dispense Refill  . ACCU-CHEK AVIVA PLUS test strip USE TO TEST TWICE DAILY    . Accu-Chek FastClix Lancets MISC USE AS DIRECTED TO CHECK BLOOD SUGAR TWICE DAILY 204 each 3  . calcium-vitamin D (OSCAL WITH D) 250-125 MG-UNIT per tablet Take 1 tablet by mouth daily.    . Cholecalciferol (VITAMIN D3) 1000 units CAPS Take 1,000 Units by mouth daily.    . Ferrous Sulfate (IRON) 325 (65 FE) MG TABS Take 325 mg by mouth daily. 30 each 0  . fexofenadine (ALLEGRA) 180 MG tablet Take 180 mg by mouth daily.     . fluticasone (FLONASE) 50 MCG/ACT nasal spray Place into both nostrils daily.    . folic acid (FOLVITE) 937 MCG tablet Take 400 mcg by mouth daily.    . furosemide (LASIX) 40 MG tablet TAKE 1 TABLET BY MOUTH TWICE DAILY 180 tablet 1  . insulin detemir (LEVEMIR FLEXPEN) 100 UNIT/ML injection Inject into the skin as directed. 18 units at bedtime and 32 units in the a.m.    . irbesartan (AVAPRO) 300 MG tablet TAKE 1 TABLET BY MOUTH DAILY 90 tablet 1  . JANUMET 50-1000 MG tablet TAKE 1 TABLET BY MOUTH TWICE DAILY WITH MEALS 180 tablet 1  . ketoconazole (NIZORAL) 2 % cream Apply 1 application topically daily as needed. Around skin folds for irritation    . Multiple Vitamin (MULTIVITAMIN WITH MINERALS) TABS Take 1 tablet by mouth daily.    .  pantoprazole (PROTONIX) 40 MG tablet TAKE 1 TABLET(40 MG) BY MOUTH DAILY 90 tablet 3  . simvastatin (ZOCOR) 40 MG tablet TAKE 1 TABLET BY MOUTH AT BEDTIME 90 tablet 1  . ursodiol (ACTIGALL) 300 MG capsule TAKE 1 CAPSULE(300 MG) BY MOUTH TWICE DAILY 180 capsule 0  . vitamin B-12 (CYANOCOBALAMIN) 1000 MCG tablet Take 1,000 mcg by mouth daily.    Marland Kitchen  vitamin C (ASCORBIC ACID) 500 MG tablet Take 500 mg by mouth daily.    . vitamin E 400 UNIT capsule Take 400 Units by mouth daily.    Marland Kitchen warfarin (COUMADIN) 5 MG tablet TAKE AS DIRECTED BY COUMADIN CLINIC 90 tablet 1  . fluticasone (FLONASE SENSIMIST) 27.5 MCG/SPRAY nasal spray Place 2 sprays into the nose daily.    . hydrALAZINE (APRESOLINE) 100 MG tablet Take 1 tablet by mouth 2 (two) times daily.  1  . metoprolol succinate (TOPROL-XL) 100 MG 24 hr tablet Take 100 mg by mouth daily. Take with or immediately following a meal.    . Metoprolol Tartrate 75 MG TABS TAKE 1 TABLET(75 MG) BY MOUTH TWICE DAILY 180 tablet 3   No facility-administered medications prior to visit.    Allergies  Allergen Reactions  . Ace Inhibitors Anaphylaxis  . Capoten [Captopril] Anaphylaxis    Throat swells shut  . Neomycin-Bacitracin Zn-Polymyx Rash    ROS Review of Systems  Constitutional: Negative for chills, fatigue and fever.  HENT: Negative for congestion, ear pain, rhinorrhea and sore throat.   Respiratory: Negative for cough and shortness of breath.   Cardiovascular: Negative for chest pain.  Gastrointestinal: Negative for abdominal pain, constipation, diarrhea, nausea and vomiting.  Genitourinary: Negative for dysuria and urgency.  Musculoskeletal: Negative for back pain and myalgias.  Neurological: Negative for dizziness, weakness, light-headedness and headaches.  Psychiatric/Behavioral: Negative for dysphoric mood. The patient is not nervous/anxious.       Objective:    Physical Exam  Constitutional: She is oriented to person, place, and time. She  appears well-developed and well-nourished.  Cardiovascular: Normal rate and normal heart sounds.  Irregularly irregular.   Pulmonary/Chest: Effort normal and breath sounds normal.  Musculoskeletal:        General: No edema.  Neurological: She is alert and oriented to person, place, and time.  Psychiatric: She has a normal mood and affect. Her behavior is normal.    BP 140/78   Pulse 84   Temp 98 F (36.7 C)   Ht 5\' 1"  (1.549 m)   Wt 175 lb 9.6 oz (79.7 kg)   BMI 33.18 kg/m  Wt Readings from Last 3 Encounters:  08/18/19 175 lb 9.6 oz (79.7 kg)  08/10/19 177 lb 12.8 oz (80.6 kg)  07/25/19 179 lb (81.2 kg)     Health Maintenance Due  Topic Date Due  . FOOT EXAM  Never done  . OPHTHALMOLOGY EXAM  Never done  . COVID-19 Vaccine (1) Never done  . HEMOGLOBIN A1C  07/01/2014    There are no preventive care reminders to display for this patient.  Lab Results  Component Value Date   TSH 1.67 02/26/2017   Lab Results  Component Value Date   WBC 5.9 04/14/2019   HGB 11.9 (L) 04/14/2019   HCT 35.6 (L) 04/14/2019   MCV 92.8 04/14/2019   PLT 204.0 04/14/2019   Lab Results  Component Value Date   NA 141 04/14/2019   K 4.1 04/14/2019   CO2 28 04/14/2019   GLUCOSE 198 (H) 04/14/2019   BUN 39 (H) 04/14/2019   CREATININE 1.34 (H) 04/14/2019   BILITOT 0.5 04/14/2019   ALKPHOS 103 04/14/2019   AST 23 04/14/2019   ALT 20 04/14/2019   PROT 7.2 04/14/2019   ALBUMIN 4.0 04/14/2019   CALCIUM 10.0 04/14/2019   GFR 37.83 (L) 04/14/2019   No results found for: CHOL No results found for: HDL No results found for: LDLCALC No  results found for: TRIG No results found for: CHOLHDL No results found for: HGBA1C    Assessment & Plan:  1. Hypertension associated with diabetes ([place]) Increase hydralazine 100 mg one three times a day. Continue metoprolol tartrate 100 mg twice daily.   - Amb Referral to Clinical Pharmacist  2. Persistent atrial fibrillation ([facility]) Continue  metoprolol and coumadin.      Follow-up: Return in about 3 weeks (around 09/08/2019) for Please move chronic appt back. fasting. Rochel Brome, MD

## 2019-08-18 ENCOUNTER — Ambulatory Visit (INDEPENDENT_AMBULATORY_CARE_PROVIDER_SITE_OTHER): Payer: Medicare Other | Admitting: Family Medicine

## 2019-08-18 ENCOUNTER — Other Ambulatory Visit: Payer: Self-pay | Admitting: Family Medicine

## 2019-08-18 ENCOUNTER — Encounter: Payer: Self-pay | Admitting: Family Medicine

## 2019-08-18 ENCOUNTER — Ambulatory Visit: Payer: Medicare Other

## 2019-08-18 ENCOUNTER — Other Ambulatory Visit: Payer: Self-pay

## 2019-08-18 VITALS — BP 140/78 | HR 84 | Temp 98.0°F | Ht 61.0 in | Wt 175.6 lb

## 2019-08-18 DIAGNOSIS — I4819 Other persistent atrial fibrillation: Secondary | ICD-10-CM

## 2019-08-18 DIAGNOSIS — E1159 Type 2 diabetes mellitus with other circulatory complications: Secondary | ICD-10-CM

## 2019-08-18 DIAGNOSIS — I10 Essential (primary) hypertension: Secondary | ICD-10-CM | POA: Diagnosis not present

## 2019-08-18 MED ORDER — HYDRALAZINE HCL 100 MG PO TABS: 100.0000 mg | ORAL_TABLET | Freq: Three times a day (TID) | ORAL | 1 refills | Status: DC

## 2019-08-18 MED ORDER — METOPROLOL SUCCINATE ER 100 MG PO TB24: 100.0000 mg | ORAL_TABLET | Freq: Every day | ORAL | 0 refills | Status: DC

## 2019-08-18 NOTE — Patient Instructions (Addendum)
Hypertension Increase hydralazine 100 mg one three times a day. Continue metoprolol 100 mg twice daily.

## 2019-08-19 ENCOUNTER — Other Ambulatory Visit: Payer: Self-pay

## 2019-08-22 ENCOUNTER — Other Ambulatory Visit: Payer: Self-pay

## 2019-08-22 ENCOUNTER — Other Ambulatory Visit: Payer: Medicare Other

## 2019-08-22 ENCOUNTER — Other Ambulatory Visit: Payer: Self-pay | Admitting: Family Medicine

## 2019-08-22 ENCOUNTER — Telehealth: Payer: Self-pay | Admitting: Family Medicine

## 2019-08-22 MED ORDER — METOPROLOL TARTRATE 100 MG PO TABS: 100.0000 mg | ORAL_TABLET | Freq: Two times a day (BID) | ORAL | 1 refills | Status: DC

## 2019-08-22 MED ORDER — METOPROLOL TARTRATE 100 MG PO TABS: 100.0000 mg | ORAL_TABLET | Freq: Two times a day (BID) | ORAL | 0 refills | Status: DC

## 2019-08-22 NOTE — Progress Notes (Signed)
  Chronic Care Management   Note  08/22/2019 Name: Sara Hancock MRN: [id number] DOB: 1935-03-29  Sara Hancock is a 85 y.o. year old female who is a primary care patient of Cox, Kirsten, MD. I reached out to [name] by phone today in response to a referral sent by Ms. Lanora Manis Brase's PCP, Cox, Kirsten, MD.   [name] was given information about Chronic Care Management services today including:  1. CCM service includes personalized support from designated clinical staff supervised by her physician, including individualized plan of care and coordination with other care providers 2. 24/7 contact phone numbers for assistance for urgent and routine care needs. 3. Service will only be billed when office clinical staff spend 20 minutes or more in a month to coordinate care. 4. Only one practitioner may furnish and bill the service in a calendar month. 5. The patient may stop CCM services at any time (effective at the end of the month) by phone call to the office staff.   Patient did not agree to services and wishes to consider information provided before deciding about enrollment in care management services.   Follow up plan:   Earney [place] [facility] Scheduler

## 2019-08-23 ENCOUNTER — Ambulatory Visit (INDEPENDENT_AMBULATORY_CARE_PROVIDER_SITE_OTHER): Payer: Medicare Other | Admitting: Cardiology

## 2019-08-23 ENCOUNTER — Other Ambulatory Visit: Payer: Self-pay

## 2019-08-23 DIAGNOSIS — Z5181 Encounter for therapeutic drug level monitoring: Secondary | ICD-10-CM

## 2019-08-23 DIAGNOSIS — I480 Paroxysmal atrial fibrillation: Secondary | ICD-10-CM | POA: Diagnosis not present

## 2019-08-23 LAB — POCT INR: INR: 1.9 — AB (ref 2.0–3.0)

## 2019-08-24 ENCOUNTER — Inpatient Hospital Stay: Payer: Medicare Other | Admitting: Family Medicine

## 2019-08-24 ENCOUNTER — Telehealth: Payer: Self-pay

## 2019-08-24 NOTE — Telephone Encounter (Signed)
Called the pt and discussed the reason for scheduling the next week was due to my availability on my schedule and they stated that it was ok and they verbalized  understanding

## 2019-08-24 NOTE — Telephone Encounter (Signed)
Please call - 3 weeks to next check is okay

## 2019-08-24 NOTE — Telephone Encounter (Signed)
Patient calling to speak with Haleigh. She states that Cyril Mourning told her to come back to the coumadin clinic on 09/06/19 but her appt is for 09/13/19.

## 2019-08-25 ENCOUNTER — Ambulatory Visit: Payer: Medicare Other | Admitting: Family Medicine

## 2019-08-28 ENCOUNTER — Encounter: Payer: Self-pay | Admitting: Family Medicine

## 2019-08-28 DIAGNOSIS — I4819 Other persistent atrial fibrillation: Secondary | ICD-10-CM

## 2019-08-28 HISTORY — DX: Other persistent atrial fibrillation: I48.19

## 2019-08-31 NOTE — Addendum Note (Signed)
Addended by: Ivy Lynn on: 08/31/2019 12:10 PM   Modules accepted: Level of Service

## 2019-09-02 ENCOUNTER — Other Ambulatory Visit: Payer: Self-pay | Admitting: Family Medicine

## 2019-09-02 ENCOUNTER — Other Ambulatory Visit: Payer: Medicare Other

## 2019-09-02 ENCOUNTER — Other Ambulatory Visit: Payer: Self-pay

## 2019-09-02 VITALS — BP 180/120 | HR 75

## 2019-09-02 DIAGNOSIS — E7800 Pure hypercholesterolemia, unspecified: Secondary | ICD-10-CM

## 2019-09-02 DIAGNOSIS — E119 Type 2 diabetes mellitus without complications: Secondary | ICD-10-CM | POA: Diagnosis not present

## 2019-09-02 DIAGNOSIS — I10 Essential (primary) hypertension: Secondary | ICD-10-CM

## 2019-09-02 DIAGNOSIS — D508 Other iron deficiency anemias: Secondary | ICD-10-CM | POA: Diagnosis not present

## 2019-09-02 MED ORDER — HYDROCHLOROTHIAZIDE 25 MG PO TABS: 25.0000 mg | ORAL_TABLET | Freq: Every day | ORAL | 0 refills | Status: DC

## 2019-09-02 NOTE — Progress Notes (Signed)
Patient complained of elevated home BP readings and asked [place] to take it in the office.  Due to high BP Dr Tobie Poet added HCTZ 25 mg daily.  She has an appointment with her next week to review labs taken today.  She was given the provider on call number to notify if her BP does not come down over the weekend.  She has an appointment with Dr Tobie Poet next week and will bring her home BP log to her appointment.  She denies chest pain, numbness, or nausea.  She has complaints of feeling "swimmy-headed".

## 2019-09-03 LAB — COMPREHENSIVE METABOLIC PANEL
ALT: 17 IU/L (ref 0–32)
AST: 23 IU/L (ref 0–40)
Albumin/Globulin Ratio: 1.5 (ref 1.2–2.2)
Albumin: 4.2 g/dL (ref 3.6–4.6)
Alkaline Phosphatase: 101 IU/L (ref 39–117)
BUN/Creatinine Ratio: 24 (ref 12–28)
BUN: 32 mg/dL — ABNORMAL HIGH (ref 8–27)
Bilirubin Total: 0.3 mg/dL (ref 0.0–1.2)
CO2: 25 mmol/L (ref 20–29)
Calcium: 9.6 mg/dL (ref 8.7–10.3)
Chloride: 103 mmol/L (ref 96–106)
Creatinine, Ser: 1.36 mg/dL — ABNORMAL HIGH (ref 0.57–1.00)
GFR calc Af Amer: 42 mL/min/{1.73_m2} — ABNORMAL LOW (ref >59)
GFR calc non Af Amer: 36 mL/min/{1.73_m2} — ABNORMAL LOW (ref >59)
Globulin, Total: 2.8 g/dL (ref 1.5–4.5)
Glucose: 151 mg/dL — ABNORMAL HIGH (ref 65–99)
Potassium: 3.8 mmol/L (ref 3.5–5.2)
Sodium: 142 mmol/L (ref 134–144)
Total Protein: 7 g/dL (ref 6.0–8.5)

## 2019-09-03 LAB — CBC WITH DIFFERENTIAL/PLATELET
Basophils Absolute: 0 10*3/uL (ref 0.0–0.2)
Basos: 0 %
EOS (ABSOLUTE): 0 10*3/uL (ref 0.0–0.4)
Eos: 1 %
Hematocrit: 34 % (ref 34.0–46.6)
Hemoglobin: 11.7 g/dL (ref 11.1–15.9)
Immature Grans (Abs): 0 10*3/uL (ref 0.0–0.1)
Immature Granulocytes: 0 %
Lymphocytes Absolute: 1.1 10*3/uL (ref 0.7–3.1)
Lymphs: 17 %
MCH: 31.6 pg (ref 26.6–33.0)
MCHC: 34.4 g/dL (ref 31.5–35.7)
MCV: 92 fL (ref 79–97)
Monocytes Absolute: 0.6 10*3/uL (ref 0.1–0.9)
Monocytes: 9 %
Neutrophils Absolute: 4.7 10*3/uL (ref 1.4–7.0)
Neutrophils: 73 %
Platelets: 212 10*3/uL (ref 150–450)
RBC: 3.7 x10E6/uL — ABNORMAL LOW (ref 3.77–5.28)
RDW: 13.7 % (ref 11.7–15.4)
WBC: 6.5 10*3/uL (ref 3.4–10.8)

## 2019-09-03 LAB — HGB A1C W/O EAG: Hgb A1c MFr Bld: 7.5 % — ABNORMAL HIGH (ref 4.8–5.6)

## 2019-09-03 LAB — LIPID PANEL W/O CHOL/HDL RATIO
Cholesterol, Total: 123 mg/dL (ref 100–199)
HDL: 48 mg/dL (ref >39)
LDL Chol Calc (NIH): 58 mg/dL (ref 0–99)
Triglycerides: 85 mg/dL (ref 0–149)
VLDL Cholesterol Cal: 17 mg/dL (ref 5–40)

## 2019-09-03 LAB — CARDIOVASCULAR RISK ASSESSMENT

## 2019-09-06 NOTE — Progress Notes (Signed)
Subjective:  Patient ID: Sara Hancock, female    DOB: 29-Aug-1934  Age: 85 y.o. MRN: [id number]  Chief Complaint  Patient presents with  . Diabetes  . Hyperlipidemia  . Hypertension    HPI  Sara Hancock presents with type 2 diabetes mellitus with complications of hyperlipidemia.  Compliance with treatment has been good; she takes her medication as directed, maintains her diet and exercise regimen, follows up as directed, and is keeping a glucose diary.  Date of diagnosis 2004.  Current meds include an oral hypoglycemic ( janumet ) and insulin/injectable ( levemir 32 U in am and 18 U in pm ).  She reports home blood glucose readings have averaged fasting readings in the < 130 mg/dL range. She checks her glucose 2 times per day.  In regard to preventative care, she performs foot self-exams daily and her last ophthalmology exam was in 04/14/2019.      Pt presents with hyperlipidemia.  Current treatment includes Zocor.  Compliance with treatment has been good; she takes her medication as directed, maintains her low cholesterol diet, follows up as directed, and maintains her exercise regimen.  She denies experiencing any hypercholesterolemia related symptoms.      Pt presents for follow up of hypertension.  Her current cardiac medication regimen includes Metoprolol 100 mg one twice a day, Irbesartan 300 mg once daily, hctz 25 mg once daily, and hydralazine 100 mg one three times a day.  She is tolerating the medication well without side effects.  Compliance with treatment has been good; she takes her medication as directed, maintains her diet and exercise regimen, and follows up as directed.      Follow up of paroxysmal atrial fibrillation.  The patient follows up with Dr. Caryl Comes [facility] every six months. Monitored by the coumadin clinic.    Tymber presents with a diagnosis of primary biliary cirrhosis.  The course has been stable and nonprogressive.  Pt sees Dr. Fuller Plan.  Had her annual visit in  December 2020. She gets an [place] every 6-12 months. She received a good report. Has gotten hepatitis A and B vaccines.   Past Medical History:  Diagnosis Date  . Anemia   . Arthritis   . Biliary cirrhosis ([place])   . Cholelithiasis 01/09/12  . Chronic kidney disease, stage 3   . Diverticulosis of colon with hemorrhage 06/08/2000  . Esophageal stenosis 01/08/11  . Esophagitis 01/08/11  . GERD (gastroesophageal reflux disease)   . Hiatal hernia 01/08/11  . HTN (hypertension)   . Hx of adenomatous colonic polyps 01/08/11  . Hyperplastic colon polyp 06/08/2000, 07/31/2003, 01/08/11  . 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 Localized edema   . Long term (current) use of insulin ([place])   . Mixed hyperlipidemia   . Obesity   . Osteopenia   . Other iron deficiency anemias   . Other specified menopausal and perimenopausal disorders   . PAF (paroxysmal atrial fibrillation) (HCC)    occurring in the setting of bradycardia and mild first degree AV block followed by Dr. Jens Som  . Pain in thoracic spine   . Pure hypercholesterolemia   . Type II or unspecified type diabetes mellitus without mention of complication, not stated as uncontrolled   . Uterine fibroid 01/09/12   Past Surgical History:  Procedure Laterality Date  . BREAST CYST EXCISION     left  . CARDIOVERSION  N/A 11/27/2017   Procedure: CARDIOVERSION;  Surgeon: Sanda Klein, MD;  Location: [facility];  Service: Cardiovascular;  Laterality: N/A;  . COLONOSCOPY    . INSERTION OF MESH  05/18/2012   Procedure: INSERTION OF MESH;  Surgeon: Adin Hector, MD;  Location: [facility];  Service: General;  Laterality: N/A;  . LIVER BIOPSY  05/18/2012   Procedure: LIVER BIOPSY;  Surgeon: Adin Hector, MD;  Location: [facility];  Service: General;;  . POLYPECTOMY    . THYROIDECTOMY, PARTIAL  1980  . TUBAL LIGATION  1972  . VENTRAL  HERNIA REPAIR  05/18/2012   Procedure: LAPAROSCOPIC VENTRAL HERNIA;  Surgeon: Adin Hector, MD;  Location: [facility];  Service: General;  Laterality: N/A;  Laparoscopic Ventral Wall Hernia with Mesh    Family History  Problem Relation Age of Onset  . Heart attack Father   . Stroke Mother   . Diabetes Sister        x 2  . Aneurysm Sister   . Stroke Sister   . Congestive Heart Failure Sister   . Diabetes Brother   . Kidney failure Other   . Hyperlipidemia Other   . Arrhythmia Other   . Congestive Heart Failure Other   . Hypertension Other   . Arthritis Other   . Colon cancer Neg Hx   . Esophageal cancer Neg Hx   . Stomach cancer Neg Hx   . Rectal cancer Neg Hx    Social History   Socioeconomic History  . Marital status: Married    Spouse name: Not on file  . Number of children: 2  . Years of education: Not on file  . Highest education level: Not on file  Occupational History  . Occupation: retired    Fish farm manager: RETIRED  Tobacco Use  . Smoking status: Never Smoker  . Smokeless tobacco: Never Used  Substance and Sexual Activity  . Alcohol use: No  . Drug use: No  . Sexual activity: Not Currently  Other Topics Concern  . Not on file  Social History Narrative  . Not on file   Social Determinants of Health   Financial Resource Strain:   . Difficulty of Paying Living Expenses:   Food Insecurity:   . Worried About Charity fundraiser in the Last Year:   . Arboriculturist in the Last Year:   Transportation Needs:   . Film/video editor (Medical):   Marland Kitchen Lack of Transportation (Non-Medical):   Physical Activity:   . Days of Exercise per Week:   . Minutes of Exercise per Session:   Stress:   . Feeling of Stress :   Social Connections:   . Frequency of Communication with Friends and Family:   . Frequency of Social Gatherings with Friends and Family:   . Attends Religious Services:   . Active Member of Clubs or Organizations:   . Attends Archivist  Meetings:   Marland Kitchen Marital Status:     Review of Systems  Constitutional: Negative for fatigue and fever.  HENT: Positive for rhinorrhea. Negative for congestion, ear pain, sinus pressure and sore throat.   Eyes: Negative for pain.  Respiratory: Negative for cough, chest tightness, shortness of breath and wheezing.   Cardiovascular: Negative for chest pain and palpitations.  Gastrointestinal: Negative for abdominal pain, constipation, diarrhea, nausea and vomiting.  Endocrine: Positive for polydipsia and polyuria.  Genitourinary: Negative for dysuria and hematuria.  Musculoskeletal: Positive for back pain (low back pain. Tizanidine. ).  Negative for arthralgias, joint swelling and myalgias.  Skin: Negative for rash.  Neurological: Negative for dizziness, weakness and headaches.  Psychiatric/Behavioral: Negative for dysphoric mood. The patient is not nervous/anxious.      Objective:  BP 118/78   Pulse 92   Temp (!) 97.5 F (36.4 C)   Ht 5' 2.5" (1.588 m)   Wt 169 lb (76.7 kg)   SpO2 95%   BMI 30.42 kg/m   BP/Weight 09/07/2019 8/41/6606 3/0/1601  Systolic BP 093 235 573  Diastolic BP 78 220 78  Wt. (Lbs) 169 - 175.6  BMI 30.42 - 33.18    Physical Exam Vitals reviewed.  Constitutional:      Appearance: Normal appearance. She is obese.  Neck:     Vascular: No carotid bruit.  Cardiovascular:     Rate and Rhythm: Normal rate and regular rhythm.     Pulses: Normal pulses.     Heart sounds: Normal heart sounds.  Pulmonary:     Effort: Pulmonary effort is normal. No respiratory distress.     Breath sounds: Normal breath sounds.  Abdominal:     General: Bowel sounds are normal.     Palpations: Abdomen is soft.     Tenderness: There is no abdominal tenderness.  Neurological:     Mental Status: She is alert and oriented to person, place, and time.  Psychiatric:        Mood and Affect: Mood normal.        Behavior: Behavior normal.    Diabetic Foot Exam - Simple   Simple  Foot Form Diabetic Foot exam was performed with the following findings: Yes 09/06/2019 12:38 AM  Visual Inspection No deformities, no ulcerations, no other skin breakdown bilaterally: Yes Sensation Testing Intact to touch and monofilament testing bilaterally: Yes Pulse Check Posterior Tibialis and Dorsalis pulse intact bilaterally: Yes Comments     Lab Results  Component Value Date   WBC 6.5 09/02/2019   HGB 11.7 09/02/2019   HCT 34.0 09/02/2019   PLT 212 09/02/2019   GLUCOSE 151 (H) 09/02/2019   CHOL 123 09/02/2019   TRIG 85 09/02/2019   HDL 48 09/02/2019   LDLCALC 58 09/02/2019   ALT 17 09/02/2019   AST 23 09/02/2019   NA 142 09/02/2019   K 3.8 09/02/2019   CL 103 09/02/2019   CREATININE 1.36 (H) 09/02/2019   BUN 32 (H) 09/02/2019   CO2 25 09/02/2019   TSH 1.67 02/26/2017   INR 1.9 (A) 08/23/2019   HGBA1C 7.5 (H) 09/02/2019      Assessment & Plan:  1. Combined hyperlipidemia associated with type 2 diabetes mellitus ([place]) Control: Fairly well controlled. Recommend check sugars fasting daily. Recommend check feet daily. Recommend annual eye exams. Medicines: no changes. Continue to work on eating a healthy diet and exercise.  Labs drawn today.    2. Hypertension associated with diabetes ([place]) Well controlled.  No changes to medicines.  Continue to work on eating a healthy diet and exercise.  Labs drawn today.   3. Mixed hyperlipidemia Well controlled.  No changes to medicines.  Continue to work on eating a healthy diet and exercise.  Labs drawn today.   4. Persistent atrial fibrillation (HCC) The current medical regimen is effective;  continue present plan and medications.  5. Primary biliary cholangitis ([place]) Managed by GI.  6. Hypertensive renal disease. CKD stage 3b. The current medical regimen is effective;  continue present plan and medications. - hydrochlorothiazide (HYDRODIURIL) 25 MG tablet; TAKE  1 TABLET(25 MG) BY MOUTH DAILY  Dispense: 90  tablet; Refill: 0  7. Acquired thrombophilia ([place]) Due to anticoagulation with coumadin which is necessary for atiral fibrillation.  8. Lumbar back pain. Refill of zanaflex.   Meds ordered this encounter  Medications  . tizanidine (ZANAFLEX) 2 MG capsule    Sig: Take 1 capsule (2 mg total) by mouth 3 (three) times daily.    Dispense:  90 capsule    Refill:  0  . hydrochlorothiazide (HYDRODIURIL) 25 MG tablet    Sig: TAKE 1 TABLET(25 MG) BY MOUTH DAILY    Dispense:  90 tablet    Refill:  0    **Patient requests 90 days supply**    Follow-up: Return in about 3 months (around 12/07/2019) for fasting.  An After Visit Summary was printed and given to the patient.  Rochel Brome [facility] [phone]

## 2019-09-07 ENCOUNTER — Other Ambulatory Visit: Payer: Self-pay

## 2019-09-07 ENCOUNTER — Ambulatory Visit (INDEPENDENT_AMBULATORY_CARE_PROVIDER_SITE_OTHER): Payer: Medicare Other | Admitting: Family Medicine

## 2019-09-07 ENCOUNTER — Encounter: Payer: Self-pay | Admitting: Family Medicine

## 2019-09-07 VITALS — BP 118/78 | HR 92 | Temp 97.5°F | Ht 62.5 in | Wt 169.0 lb

## 2019-09-07 DIAGNOSIS — E782 Mixed hyperlipidemia: Secondary | ICD-10-CM | POA: Diagnosis not present

## 2019-09-07 DIAGNOSIS — I129 Hypertensive chronic kidney disease with stage 1 through stage 4 chronic kidney disease, or unspecified chronic kidney disease: Secondary | ICD-10-CM

## 2019-09-07 DIAGNOSIS — E1169 Type 2 diabetes mellitus with other specified complication: Secondary | ICD-10-CM | POA: Diagnosis not present

## 2019-09-07 DIAGNOSIS — I4819 Other persistent atrial fibrillation: Secondary | ICD-10-CM

## 2019-09-07 DIAGNOSIS — I10 Essential (primary) hypertension: Secondary | ICD-10-CM | POA: Diagnosis not present

## 2019-09-07 DIAGNOSIS — N1832 Chronic kidney disease, stage 3b: Secondary | ICD-10-CM

## 2019-09-07 DIAGNOSIS — D6869 Other thrombophilia: Secondary | ICD-10-CM

## 2019-09-07 DIAGNOSIS — E1159 Type 2 diabetes mellitus with other circulatory complications: Secondary | ICD-10-CM | POA: Diagnosis not present

## 2019-09-07 DIAGNOSIS — I152 Hypertension secondary to endocrine disorders: Secondary | ICD-10-CM

## 2019-09-07 DIAGNOSIS — K743 Primary biliary cirrhosis: Secondary | ICD-10-CM | POA: Diagnosis not present

## 2019-09-07 MED ORDER — TIZANIDINE HCL 2 MG PO CAPS: 2.0000 mg | ORAL_CAPSULE | Freq: Three times a day (TID) | ORAL | 0 refills | Status: DC

## 2019-09-07 MED ORDER — HYDROCHLOROTHIAZIDE 25 MG PO TABS: ORAL_TABLET | ORAL | 0 refills | Status: DC

## 2019-09-11 ENCOUNTER — Encounter: Payer: Self-pay | Admitting: Family Medicine

## 2019-09-11 DIAGNOSIS — N1832 Chronic kidney disease, stage 3b: Secondary | ICD-10-CM

## 2019-09-11 DIAGNOSIS — D6869 Other thrombophilia: Secondary | ICD-10-CM

## 2019-09-11 HISTORY — DX: Other thrombophilia: D68.69

## 2019-09-11 HISTORY — DX: Chronic kidney disease, stage 3b: N18.32

## 2019-09-13 ENCOUNTER — Ambulatory Visit (INDEPENDENT_AMBULATORY_CARE_PROVIDER_SITE_OTHER): Payer: Medicare Other | Admitting: *Deleted

## 2019-09-13 ENCOUNTER — Other Ambulatory Visit: Payer: Self-pay

## 2019-09-13 DIAGNOSIS — Z5181 Encounter for therapeutic drug level monitoring: Secondary | ICD-10-CM

## 2019-09-13 DIAGNOSIS — I480 Paroxysmal atrial fibrillation: Secondary | ICD-10-CM | POA: Diagnosis not present

## 2019-09-13 LAB — POCT INR: INR: 3.5 — AB (ref 2.0–3.0)

## 2019-09-13 NOTE — Patient Instructions (Signed)
Hold warfarin today then resume 1/2 tablet daily except 1 tablet each Tuesday, Thursday and Saturday  Increase greens/salads  Recheck in 3 weeks. Call with new medications or any procedures  [phone]

## 2019-09-15 ENCOUNTER — Other Ambulatory Visit: Payer: Self-pay

## 2019-09-19 ENCOUNTER — Other Ambulatory Visit: Payer: Self-pay

## 2019-09-19 MED ORDER — TIZANIDINE HCL 2 MG PO TABS: 2.0000 mg | ORAL_TABLET | Freq: Three times a day (TID) | ORAL | 2 refills | Status: DC

## 2019-10-01 ENCOUNTER — Other Ambulatory Visit: Payer: Self-pay | Admitting: Gastroenterology

## 2019-10-01 ENCOUNTER — Other Ambulatory Visit: Payer: Self-pay | Admitting: Family Medicine

## 2019-10-04 ENCOUNTER — Other Ambulatory Visit: Payer: Self-pay

## 2019-10-04 ENCOUNTER — Ambulatory Visit (INDEPENDENT_AMBULATORY_CARE_PROVIDER_SITE_OTHER): Payer: Medicare Other | Admitting: Pharmacist

## 2019-10-04 DIAGNOSIS — Z5181 Encounter for therapeutic drug level monitoring: Secondary | ICD-10-CM | POA: Diagnosis not present

## 2019-10-04 DIAGNOSIS — I480 Paroxysmal atrial fibrillation: Secondary | ICD-10-CM | POA: Diagnosis not present

## 2019-10-04 LAB — POCT INR: INR: 4.2 — AB (ref 2.0–3.0)

## 2019-10-04 NOTE — Patient Instructions (Signed)
Description   Hold warfarin today and tomorrow.  then resume 1/2 tablet daily except 1 tablet each Tuesday, Thursday and Saturday  Increase greens/salads  Recheck in 1 weeks. Call with new medications or any procedures  [phone].

## 2019-10-11 ENCOUNTER — Ambulatory Visit (INDEPENDENT_AMBULATORY_CARE_PROVIDER_SITE_OTHER): Payer: Medicare Other | Admitting: Pharmacist

## 2019-10-11 ENCOUNTER — Other Ambulatory Visit: Payer: Self-pay

## 2019-10-11 DIAGNOSIS — I480 Paroxysmal atrial fibrillation: Secondary | ICD-10-CM | POA: Diagnosis not present

## 2019-10-11 DIAGNOSIS — Z5181 Encounter for therapeutic drug level monitoring: Secondary | ICD-10-CM

## 2019-10-11 LAB — POCT INR: INR: 2.1 (ref 2.0–3.0)

## 2019-10-11 NOTE — Patient Instructions (Signed)
Description   Continue taking 1/2 tablet daily except 1 tablet each Tuesday, Thursday and Saturday  Stay consistent with greens/salads Recheck in 3 weeks. Call with new medications or any procedures  [phone].

## 2019-10-12 ENCOUNTER — Telehealth: Payer: Self-pay

## 2019-10-12 ENCOUNTER — Telehealth: Payer: Self-pay | Admitting: Gastroenterology

## 2019-10-12 ENCOUNTER — Other Ambulatory Visit: Payer: Self-pay

## 2019-10-12 DIAGNOSIS — K743 Primary biliary cirrhosis: Secondary | ICD-10-CM

## 2019-10-12 NOTE — Telephone Encounter (Signed)
-----   Message from Marzella Schlein, [place] sent at 04/26/2019 10:57 AM EST ----- Pt needs repeat 6 month f/u labs ( CMP, CBC, AFP) and RUQ abd ultrasound for [facility] screening.

## 2019-10-12 NOTE — Telephone Encounter (Signed)
Left message for patient to return my call.

## 2019-10-12 NOTE — Telephone Encounter (Signed)
See previous phone note.  

## 2019-10-12 NOTE — Telephone Encounter (Signed)
Scheduled patient for RUQ ultrasound for 11/17/19 at 10:30am at [facility] radiology.Informed patient npo 6 hours prior. Patient will come by for labs in our office the day of the ultrasound. Patient verbalized understanding.

## 2019-11-08 ENCOUNTER — Ambulatory Visit (INDEPENDENT_AMBULATORY_CARE_PROVIDER_SITE_OTHER): Payer: Medicare Other | Admitting: Pharmacist

## 2019-11-08 ENCOUNTER — Other Ambulatory Visit: Payer: Self-pay

## 2019-11-08 DIAGNOSIS — Z5181 Encounter for therapeutic drug level monitoring: Secondary | ICD-10-CM | POA: Diagnosis not present

## 2019-11-08 DIAGNOSIS — I480 Paroxysmal atrial fibrillation: Secondary | ICD-10-CM | POA: Diagnosis not present

## 2019-11-08 LAB — POCT INR: INR: 2.8 (ref 2.0–3.0)

## 2019-11-08 NOTE — Patient Instructions (Signed)
Continue taking 1/2 tablet daily except 1 tablet each Tuesday, Thursday and Saturday  Stay consistent with greens/salads Recheck in 4 weeks. Call with new medications or any procedures  [phone].

## 2019-11-10 ENCOUNTER — Ambulatory Visit (HOSPITAL_COMMUNITY): Payer: Medicare Other

## 2019-11-13 ENCOUNTER — Other Ambulatory Visit: Payer: Self-pay | Admitting: Family Medicine

## 2019-11-17 ENCOUNTER — Ambulatory Visit (HOSPITAL_COMMUNITY): Payer: Medicare Other

## 2019-11-29 ENCOUNTER — Other Ambulatory Visit: Payer: Self-pay

## 2019-11-29 ENCOUNTER — Ambulatory Visit (HOSPITAL_COMMUNITY)
Admission: RE | Admit: 2019-11-29 | Discharge: 2019-11-29 | Disposition: A | Discharge status: Home / Self Care | Payer: Medicare Other | Source: Ambulatory Visit | Attending: Gastroenterology | Admitting: Gastroenterology

## 2019-11-29 ENCOUNTER — Other Ambulatory Visit (INDEPENDENT_AMBULATORY_CARE_PROVIDER_SITE_OTHER): Payer: Medicare Other

## 2019-11-29 DIAGNOSIS — K743 Primary biliary cirrhosis: Secondary | ICD-10-CM

## 2019-11-29 DIAGNOSIS — K8020 Calculus of gallbladder without cholecystitis without obstruction: Secondary | ICD-10-CM | POA: Diagnosis not present

## 2019-11-29 LAB — COMPREHENSIVE METABOLIC PANEL
ALT: 18 U/L (ref 0–35)
AST: 21 U/L (ref 0–37)
Albumin: 4.2 g/dL (ref 3.5–5.2)
Alkaline Phosphatase: 76 U/L (ref 39–117)
BUN: 87 mg/dL (ref 6–23)
CO2: 33 mEq/L — ABNORMAL HIGH (ref 19–32)
Calcium: 10.8 mg/dL — ABNORMAL HIGH (ref 8.4–10.5)
Chloride: 97 mEq/L (ref 96–112)
Creatinine, Ser: 2.2 mg/dL — ABNORMAL HIGH (ref 0.40–1.20)
GFR: 21.32 mL/min — ABNORMAL LOW (ref >60.00)
Glucose, Bld: 126 mg/dL — ABNORMAL HIGH (ref 70–99)
Potassium: 3.4 mEq/L — ABNORMAL LOW (ref 3.5–5.1)
Sodium: 138 mEq/L (ref 135–145)
Total Bilirubin: 0.4 mg/dL (ref 0.2–1.2)
Total Protein: 7.3 g/dL (ref 6.0–8.3)

## 2019-11-29 LAB — CBC WITH DIFFERENTIAL/PLATELET
Basophils Absolute: 0 10*3/uL (ref 0.0–0.1)
Basophils Relative: 0.4 % (ref 0.0–3.0)
Eosinophils Absolute: 0 10*3/uL (ref 0.0–0.7)
Eosinophils Relative: 0.6 % (ref 0.0–5.0)
HCT: 33.4 % — ABNORMAL LOW (ref 36.0–46.0)
Hemoglobin: 11.3 g/dL — ABNORMAL LOW (ref 12.0–15.0)
Lymphocytes Relative: 21.3 % (ref 12.0–46.0)
Lymphs Abs: 1.6 10*3/uL (ref 0.7–4.0)
MCHC: 33.9 g/dL (ref 30.0–36.0)
MCV: 93.9 fl (ref 78.0–100.0)
Monocytes Absolute: 0.7 10*3/uL (ref 0.1–1.0)
Monocytes Relative: 8.9 % (ref 3.0–12.0)
Neutro Abs: 5.3 10*3/uL (ref 1.4–7.7)
Neutrophils Relative %: 68.8 % (ref 43.0–77.0)
Platelets: 200 10*3/uL (ref 150.0–400.0)
RBC: 3.56 Mil/uL — ABNORMAL LOW (ref 3.87–5.11)
RDW: 15.3 % (ref 11.5–15.5)
WBC: 7.7 10*3/uL (ref 4.0–10.5)

## 2019-11-30 LAB — AFP TUMOR MARKER: AFP-Tumor Marker: 1.3 ng/mL

## 2019-12-01 ENCOUNTER — Telehealth: Payer: Self-pay

## 2019-12-01 NOTE — Telephone Encounter (Signed)
Sara Hancock called to report that she had labs with her GI physician Dr. Jacinto Halim and he stopped her furosemide and her HCTZ for now and follow-up with Dr. Tobie Poet.  Dr.Cox has reviewed labs.  Patient is scheduled for follow-up.  She is going to monitor her bp and call us back before her appointment is needed.

## 2019-12-05 ENCOUNTER — Telehealth: Payer: Self-pay | Admitting: Internal Medicine

## 2019-12-05 ENCOUNTER — Other Ambulatory Visit: Payer: Medicare Other

## 2019-12-05 NOTE — Telephone Encounter (Signed)
Patient called to speak with [facility] regarding a conflict with her coumadin appt tomorrow at 1030am. Transferred call to [facility].

## 2019-12-06 ENCOUNTER — Telehealth: Payer: Self-pay

## 2019-12-06 NOTE — Telephone Encounter (Signed)
Sara Hancock called to report that she is retaining fluid since stopping the furosemide and hctz.  Her weight is up from 169 last week to 178 lb.  She had increased shortness of breath.  Dr. Tobie Poet advised that she restart the fursemide 40 mg to 1 daily.  She is rescheduled to follow-up in the office next week.

## 2019-12-08 ENCOUNTER — Ambulatory Visit: Payer: Medicare Other | Admitting: Family Medicine

## 2019-12-09 ENCOUNTER — Other Ambulatory Visit: Payer: Self-pay

## 2019-12-09 ENCOUNTER — Other Ambulatory Visit: Payer: Medicare Other

## 2019-12-09 DIAGNOSIS — I10 Essential (primary) hypertension: Secondary | ICD-10-CM

## 2019-12-09 DIAGNOSIS — E785 Hyperlipidemia, unspecified: Secondary | ICD-10-CM | POA: Diagnosis not present

## 2019-12-09 DIAGNOSIS — E119 Type 2 diabetes mellitus without complications: Secondary | ICD-10-CM

## 2019-12-10 LAB — CARDIOVASCULAR RISK ASSESSMENT

## 2019-12-10 LAB — COMPREHENSIVE METABOLIC PANEL
ALT: 23 IU/L (ref 0–32)
AST: 27 IU/L (ref 0–40)
Albumin/Globulin Ratio: 1.5 (ref 1.2–2.2)
Albumin: 4 g/dL (ref 3.6–4.6)
Alkaline Phosphatase: 96 IU/L (ref 48–121)
BUN/Creatinine Ratio: 24 (ref 12–28)
BUN: 38 mg/dL — ABNORMAL HIGH (ref 8–27)
Bilirubin Total: 0.3 mg/dL (ref 0.0–1.2)
CO2: 20 mmol/L (ref 20–29)
Calcium: 9.7 mg/dL (ref 8.7–10.3)
Chloride: 106 mmol/L (ref 96–106)
Creatinine, Ser: 1.57 mg/dL — ABNORMAL HIGH (ref 0.57–1.00)
GFR calc Af Amer: 35 mL/min/{1.73_m2} — ABNORMAL LOW (ref >59)
GFR calc non Af Amer: 30 mL/min/{1.73_m2} — ABNORMAL LOW (ref >59)
Globulin, Total: 2.6 g/dL (ref 1.5–4.5)
Glucose: 138 mg/dL — ABNORMAL HIGH (ref 65–99)
Potassium: 3.6 mmol/L (ref 3.5–5.2)
Sodium: 143 mmol/L (ref 134–144)
Total Protein: 6.6 g/dL (ref 6.0–8.5)

## 2019-12-10 LAB — CBC WITH DIFFERENTIAL/PLATELET
Basophils Absolute: 0 10*3/uL (ref 0.0–0.2)
Basos: 1 %
EOS (ABSOLUTE): 0 10*3/uL (ref 0.0–0.4)
Eos: 1 %
Hematocrit: 30.9 % — ABNORMAL LOW (ref 34.0–46.6)
Hemoglobin: 10.3 g/dL — ABNORMAL LOW (ref 11.1–15.9)
Immature Grans (Abs): 0 10*3/uL (ref 0.0–0.1)
Immature Granulocytes: 1 %
Lymphocytes Absolute: 1 10*3/uL (ref 0.7–3.1)
Lymphs: 15 %
MCH: 32 pg (ref 26.6–33.0)
MCHC: 33.3 g/dL (ref 31.5–35.7)
MCV: 96 fL (ref 79–97)
Monocytes Absolute: 0.6 10*3/uL (ref 0.1–0.9)
Monocytes: 9 %
Neutrophils Absolute: 4.9 10*3/uL (ref 1.4–7.0)
Neutrophils: 73 %
Platelets: 193 10*3/uL (ref 150–450)
RBC: 3.22 x10E6/uL — ABNORMAL LOW (ref 3.77–5.28)
RDW: 14.1 % (ref 11.7–15.4)
WBC: 6.6 10*3/uL (ref 3.4–10.8)

## 2019-12-10 LAB — LIPID PANEL
Chol/HDL Ratio: 2.3 ratio (ref 0.0–4.4)
Cholesterol, Total: 110 mg/dL (ref 100–199)
HDL: 48 mg/dL (ref >39)
LDL Chol Calc (NIH): 46 mg/dL (ref 0–99)
Triglycerides: 78 mg/dL (ref 0–149)
VLDL Cholesterol Cal: 16 mg/dL (ref 5–40)

## 2019-12-10 LAB — HEMOGLOBIN A1C
Est. average glucose Bld gHb Est-mCnc: 163 mg/dL
Hgb A1c MFr Bld: 7.3 % — ABNORMAL HIGH (ref 4.8–5.6)

## 2019-12-13 ENCOUNTER — Telehealth: Payer: Self-pay | Admitting: Cardiology

## 2019-12-13 ENCOUNTER — Ambulatory Visit (INDEPENDENT_AMBULATORY_CARE_PROVIDER_SITE_OTHER): Payer: Medicare Other

## 2019-12-13 ENCOUNTER — Encounter: Payer: Self-pay | Admitting: Family Medicine

## 2019-12-13 ENCOUNTER — Other Ambulatory Visit: Payer: Self-pay | Admitting: Family Medicine

## 2019-12-13 ENCOUNTER — Other Ambulatory Visit: Payer: Self-pay

## 2019-12-13 ENCOUNTER — Ambulatory Visit (INDEPENDENT_AMBULATORY_CARE_PROVIDER_SITE_OTHER): Payer: Medicare Other | Admitting: Family Medicine

## 2019-12-13 VITALS — BP 130/60 | HR 72 | Temp 97.2°F | Resp 14 | Ht 61.0 in | Wt 180.0 lb

## 2019-12-13 DIAGNOSIS — R0602 Shortness of breath: Secondary | ICD-10-CM

## 2019-12-13 DIAGNOSIS — D6869 Other thrombophilia: Secondary | ICD-10-CM | POA: Diagnosis not present

## 2019-12-13 DIAGNOSIS — D649 Anemia, unspecified: Secondary | ICD-10-CM

## 2019-12-13 DIAGNOSIS — N184 Chronic kidney disease, stage 4 (severe): Secondary | ICD-10-CM | POA: Diagnosis not present

## 2019-12-13 DIAGNOSIS — E785 Hyperlipidemia, unspecified: Secondary | ICD-10-CM

## 2019-12-13 DIAGNOSIS — R600 Localized edema: Secondary | ICD-10-CM | POA: Insufficient documentation

## 2019-12-13 DIAGNOSIS — E1122 Type 2 diabetes mellitus with diabetic chronic kidney disease: Secondary | ICD-10-CM

## 2019-12-13 DIAGNOSIS — I480 Paroxysmal atrial fibrillation: Secondary | ICD-10-CM | POA: Diagnosis not present

## 2019-12-13 DIAGNOSIS — I4819 Other persistent atrial fibrillation: Secondary | ICD-10-CM

## 2019-12-13 DIAGNOSIS — K743 Primary biliary cirrhosis: Secondary | ICD-10-CM | POA: Diagnosis not present

## 2019-12-13 DIAGNOSIS — D631 Anemia in chronic kidney disease: Secondary | ICD-10-CM | POA: Diagnosis not present

## 2019-12-13 DIAGNOSIS — Z5181 Encounter for therapeutic drug level monitoring: Secondary | ICD-10-CM

## 2019-12-13 HISTORY — DX: Shortness of breath: R06.02

## 2019-12-13 HISTORY — DX: Localized edema: R60.0

## 2019-12-13 LAB — CBC WITH DIFFERENTIAL/PLATELET
Basophils Absolute: 0 10*3/uL (ref 0.0–0.2)
Basos: 0 %
EOS (ABSOLUTE): 0 10*3/uL (ref 0.0–0.4)
Eos: 0 %
Hematocrit: 29.6 % — ABNORMAL LOW (ref 34.0–46.6)
Hemoglobin: 9.9 g/dL — ABNORMAL LOW (ref 11.1–15.9)
Immature Grans (Abs): 0 10*3/uL (ref 0.0–0.1)
Immature Granulocytes: 1 %
Lymphocytes Absolute: 0.8 10*3/uL (ref 0.7–3.1)
Lymphs: 13 %
MCH: 31.9 pg (ref 26.6–33.0)
MCHC: 33.4 g/dL (ref 31.5–35.7)
MCV: 96 fL (ref 79–97)
Monocytes Absolute: 0.5 10*3/uL (ref 0.1–0.9)
Monocytes: 8 %
Neutrophils Absolute: 4.7 10*3/uL (ref 1.4–7.0)
Neutrophils: 78 %
Platelets: 209 10*3/uL (ref 150–450)
RBC: 3.1 x10E6/uL — ABNORMAL LOW (ref 3.77–5.28)
RDW: 14.2 % (ref 11.7–15.4)
WBC: 6 10*3/uL (ref 3.4–10.8)

## 2019-12-13 LAB — POCT INR: INR: 2.5 (ref 2.0–3.0)

## 2019-12-13 MED ORDER — POTASSIUM CHLORIDE ER 20 MEQ PO TBCR: 20.0000 meq | EXTENDED_RELEASE_TABLET | Freq: Every day | ORAL | 1 refills | Status: DC

## 2019-12-13 NOTE — Progress Notes (Signed)
Subjective:  Patient ID: Sara Hancock, female    DOB: [birth year]  Age: 85 y.o. MRN: [id number]  Chief Complaint  Patient presents with  . Diabetes  . Hyperlipidemia  . Hypertension  . Shortness of Breath    HPI  Sara Hancock presents with type 2 diabetes mellitus with complications of hyperlipidemia.  Compliance with treatment has been good; she takes her medication as directed, maintains her diet and exercise regimen, follows up as directed, and is keeping a glucose diary.  Date of diagnosis 2004.  Current meds include an oral hypoglycemic ( janumet ) and insulin/injectable ( levemir 32 U in am and 18 U in pm ).  She reports home blood glucose readings have averaged fasting readings in the < 130 mg/dL range. She checks her glucose 2 times per day. Her A1C this week was 7.2. In regard to preventative care, she performs foot self-exams daily and her last ophthalmology exam was in 04/14/2019.      Pt presents with hyperlipidemia.  Current treatment includes Zocor.  Compliance with treatment has been good; she takes her medication as directed, maintains her low cholesterol diet, follows up as directed, and maintains her exercise regimen.  She denies experiencing any hypercholesterolemia related symptoms.      Pt presents for follow up of hypertension.  Her current cardiac medication regimen includes Metoprolol 100 mg one twice a day, Irbesartan 300 mg once daily, hctz 25 mg once daily, and hydralazine 100 mg one three times a day.  She is tolerating the medication well without side effects.  Compliance with treatment has been good; she takes her medication as directed, maintains her diet and exercise regimen, and follows up as directed.      Follow up of paroxysmal atrial fibrillation.  The patient follows up with Dr. Caryl Comes Cardiology every six months. Monitored by the coumadin clinic. Patient is complaining of SOB and worsened swelling since holding her HCTZ and Lasix which were held due to  worsening kidney function.  However, her swelling worsened significantly and she called last week and was advised to start on furosemide 40 mg once daily.  Denies SOB when lying down. Her last echo was in 2019 and showed no CHF.   Sara Hancock presents with a diagnosis of primary biliary cirrhosis.  The course has been stable and nonprogressive.  Pt sees Dr. Fuller Plan.  Had her annual visit in December 2020. She gets an [place] every 6-12 months. She received a good report. Has gotten hepatitis A and B vaccines.  SOB-Patient states she was taken off of HCTZ and Lasix due its affect on her kidney function. However, her swelling is not improving very much. She called last week and was advised to start on 1 furosemide. Denies SOB when lying down.   Past Medical History:  Diagnosis Date  . Anemia   . Arthritis   . Biliary cirrhosis ([place])   . Cholelithiasis 01/09/12  . Chronic kidney disease, stage 3   . Diverticulosis of colon with hemorrhage 06/08/2000  . Esophageal stenosis 01/08/11  . Esophagitis 01/08/11  . GERD (gastroesophageal reflux disease)   . Hiatal hernia 01/08/11  . HTN (hypertension)   . Hx of adenomatous colonic polyps 01/08/11  . Hyperplastic colon polyp 06/08/2000, 07/31/2003, 01/08/11  . 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 Localized edema   . Long term (current) use of insulin ([place])   .  Mixed hyperlipidemia   . Obesity   . Osteopenia   . Other iron deficiency anemias   . Other specified menopausal and perimenopausal disorders   . PAF (paroxysmal atrial fibrillation) (HCC)    occurring in the setting of bradycardia and mild first degree AV block followed by Dr. Jens Som  . Pain in thoracic spine   . Pure hypercholesterolemia   . Type II or unspecified type diabetes mellitus without mention of complication, not stated as uncontrolled   . Uterine fibroid 01/09/12   Past  Surgical History:  Procedure Laterality Date  . BREAST CYST EXCISION     left  . CARDIOVERSION N/A 11/27/2017   Procedure: CARDIOVERSION;  Surgeon: Sanda Klein, MD;  [facility];  Service: Cardiovascular;  Laterality: N/A;  . COLONOSCOPY    . INSERTION OF MESH  05/18/2012   Procedure: INSERTION OF MESH;  Surgeon: Adin Hector, MD;  Location: [facility];  Service: General;  Laterality: N/A;  . LIVER BIOPSY  05/18/2012   Procedure: LIVER BIOPSY;  Surgeon: Adin Hector, MD;  Location: [facility];  Service: General;;  . POLYPECTOMY    . THYROIDECTOMY, PARTIAL  1980  . TUBAL LIGATION  1972  . VENTRAL HERNIA REPAIR  05/18/2012   Procedure: LAPAROSCOPIC VENTRAL HERNIA;  Surgeon: Adin Hector, MD;  Location: [facility];  Service: General;  Laterality: N/A;  Laparoscopic Ventral Wall Hernia with Mesh    Family History  Problem Relation Age of Onset  . Heart attack Father   . Stroke Mother   . Diabetes Sister        x 2  . Aneurysm Sister   . Stroke Sister   . Congestive Heart Failure Sister   . Diabetes Brother   . Kidney failure Other   . Hyperlipidemia Other   . Arrhythmia Other   . Congestive Heart Failure Other   . Hypertension Other   . Arthritis Other   . Colon cancer Neg Hx   . Esophageal cancer Neg Hx   . Stomach cancer Neg Hx   . Rectal cancer Neg Hx    Social History   Socioeconomic History  . Marital status: Married    Spouse name: Not on file  . Number of children: 2  . Years of education: Not on file  . Highest education level: Not on file  Occupational History  . Occupation: retired    Fish farm manager: RETIRED  Tobacco Use  . Smoking status: Never Smoker  . Smokeless tobacco: Never Used  Vaping Use  . Vaping Use: Never used  Substance and Sexual Activity  . Alcohol use: No  . Drug use: No  . Sexual activity: Not Currently  Other Topics Concern  . Not on file  Social History Narrative  . Not on file   Social Determinants of Health   Financial  Resource Strain:   . Difficulty of Paying Living Expenses:   Food Insecurity:   . Worried About Charity fundraiser in the Last Year:   . Arboriculturist in the Last Year:   Transportation Needs:   . Film/video editor (Medical):   Marland Kitchen Lack of Transportation (Non-Medical):   Physical Activity:   . Days of Exercise per Week:   . Minutes of Exercise per Session:   Stress:   . Feeling of Stress :   Social Connections:   . Frequency of Communication with Friends and Family:   . Frequency of Social Gatherings with Friends and Family:   .  Attends Religious Services:   . Active Member of Clubs or Organizations:   . Attends Archivist Meetings:   Marland Kitchen Marital Status:     Review of Systems  Constitutional: Negative for fatigue and fever.  HENT: Negative for congestion, ear pain, rhinorrhea, sinus pressure and sore throat.   Eyes: Negative for pain.  Respiratory: Positive for shortness of breath and wheezing. Negative for cough and chest tightness.   Cardiovascular: Positive for leg swelling. Negative for chest pain and palpitations.  Gastrointestinal: Negative for abdominal pain, constipation, diarrhea, nausea and vomiting.  Endocrine: Positive for polydipsia. Negative for polyuria.  Genitourinary: Negative for dysuria and hematuria.  Musculoskeletal: Positive for back pain (low back pain. Tizanidine. ). Negative for arthralgias, joint swelling and myalgias.  Skin: Negative for rash.  Neurological: Negative for dizziness, weakness and headaches.  Psychiatric/Behavioral: Negative for dysphoric mood. The patient is not nervous/anxious.      Objective:  BP 130/60   Pulse 72   Temp (!) 97.2 F (36.2 C)   Resp 14   Ht 5\' 1"  (1.549 m)   Wt 180 lb (81.6 kg)   SpO2 95%   BMI 34.01 kg/m   BP/Weight 12/13/2019 09/07/2019 1/66/0630  Systolic BP 160 109 323  Diastolic BP 60 78 557  Wt. (Lbs) 180 169 -  BMI 34.01 30.42 -   Physical Exam Vitals reviewed.  Constitutional:       Appearance: She is well-developed. She is obese.  Cardiovascular:     Rate and Rhythm: Normal rate. Rhythm irregular.     Heart sounds: No murmur heard.   Pulmonary:     Effort: Pulmonary effort is normal.     Breath sounds: Normal breath sounds.  Abdominal:     General: Bowel sounds are normal.     Palpations: Abdomen is soft. There is no mass.     Tenderness: There is no abdominal tenderness.  Musculoskeletal:     Right lower leg: Edema (3 +) present.     Left lower leg: Edema (3 +) present.  Neurological:     Mental Status: She is alert and oriented to person, place, and time.  Psychiatric:        Mood and Affect: Mood normal.        Behavior: Behavior normal.      Lab Results  Component Value Date   WBC 6.6 12/09/2019   HGB 10.3 (L) 12/09/2019   HCT 30.9 (L) 12/09/2019   PLT 193 12/09/2019   GLUCOSE 138 (H) 12/09/2019   CHOL 110 12/09/2019   TRIG 78 12/09/2019   HDL 48 12/09/2019   LDLCALC 46 12/09/2019   ALT 23 12/09/2019   AST 27 12/09/2019   NA 143 12/09/2019   K 3.6 12/09/2019   CL 106 12/09/2019   CREATININE 1.57 (H) 12/09/2019   BUN 38 (H) 12/09/2019   CO2 20 12/09/2019   TSH 1.67 02/26/2017   INR 2.8 11/08/2019   HGBA1C 7.3 (H) 12/09/2019      Assessment & Plan:  1. Dyslipidemia Well controlled.  No changes to medicines.  Continue to work on eating a healthy diet and exercise.   2. CKD stage 4 due to type 2 diabetes mellitus ([place]) Control: well controlled. A1C 7.2. CKD improved with holding diuretics, however she has had worsening swelling. Increase lasix 40 mg one twice a day and remain off hctz. Add potassium chloride 20 meq once daily.  Recommend check sugars fasting daily. Recommend check feet daily. Recommend annual  eye exams. Medicines: no changes at this time, although I would consider a SGL 2 after discussing with cardiology.  Continue to work on eating a healthy diet and exercise.   3. Shortness of breath  Recommend repeat echo as  it has been two years since her last one. Discuss with Dr. Caryl Comes. - Pro b natriuretic peptide  4. Persistent atrial fibrillation (HCC)  Stable.   5. Primary biliary cholangitis ([place])  Mgmt per dr. Fuller Plan  6. Acquired thrombophilia ([place])  Due to coumadin. Needed due to atrial fibrillation.  7. Anemia due to stage 4 chronic kidney disease (HCC)  Normocytic. Recheck cbc today as her hemoglobin dropped over the last 2 weeks. This might be due to third spacing.   8. Pedal Edema  Increase lasix 40 mg one twice daily.  Start on potassium chloride 20 meq once daily.   Meds ordered this encounter  Medications  . potassium chloride 20 MEQ TBCR    Sig: Take 20 mEq by mouth daily.    Dispense:  30 tablet    Refill:  1    Follow-up: Return in about 2 weeks (around 12/27/2019).  An After Visit Summary was printed and given to the patient.  Rochel Brome [facility] [phone]

## 2019-12-13 NOTE — Telephone Encounter (Signed)
New Message  Dr. Tobie Poet with [facility] would like to speak with nurse about patient.

## 2019-12-13 NOTE — Patient Instructions (Signed)
Continue taking 1/2 tablet daily except 1 tablet each Tuesday and Saturday  Stay consistent with greens/salads Recheck in 4 weeks. Call with new medications or any procedures  [phone].

## 2019-12-13 NOTE — Telephone Encounter (Signed)
Spoke with Dr. Tobie Poet just now and she let me know that she meant to send this message over to Dr. Olin Pia nurse.

## 2019-12-13 NOTE — Patient Instructions (Signed)
INCREASE LASIX TO 40 MG ONE TWICE A DAY. START POTASSIUM CHLORIDE 20 MEQ ONCE DAILY.

## 2019-12-14 LAB — PRO B NATRIURETIC PEPTIDE: NT-Pro BNP: 7582 pg/mL — ABNORMAL HIGH (ref 0–738)

## 2019-12-15 ENCOUNTER — Ambulatory Visit: Payer: Medicare Other | Admitting: Family Medicine

## 2019-12-15 ENCOUNTER — Ambulatory Visit (INDEPENDENT_AMBULATORY_CARE_PROVIDER_SITE_OTHER): Payer: Medicare Other | Admitting: Family Medicine

## 2019-12-15 ENCOUNTER — Other Ambulatory Visit: Payer: Self-pay

## 2019-12-15 ENCOUNTER — Other Ambulatory Visit: Payer: Self-pay | Admitting: Family Medicine

## 2019-12-15 VITALS — BP 136/80 | HR 93 | Temp 97.4°F | Ht 62.5 in | Wt 181.0 lb

## 2019-12-15 DIAGNOSIS — I509 Heart failure, unspecified: Secondary | ICD-10-CM | POA: Diagnosis not present

## 2019-12-15 DIAGNOSIS — R0602 Shortness of breath: Secondary | ICD-10-CM

## 2019-12-15 DIAGNOSIS — R600 Localized edema: Secondary | ICD-10-CM

## 2019-12-15 MED ORDER — TORSEMIDE 20 MG PO TABS: 40.0000 mg | ORAL_TABLET | Freq: Every day | ORAL | 0 refills | Status: DC

## 2019-12-15 NOTE — Progress Notes (Signed)
Acute Office Visit  Subjective:    Patient ID: Sara Hancock, female    DOB: [birth year], 85 y.o.   MRN: [id number]  Chief Complaint  Patient presents with  . Leg Pain    HPI Patient is in today for BL leg pain left is worse than the right. Pain started 1-2 weeks ago after patient was advised to stop two of her medications.I increased her lasix to 40 mg two daily on 8/3. BNP was 7000+. She presents today concerned because her left leg is getting red.  Past Medical History:  Diagnosis Date  . Anemia   . Arthritis   . Biliary cirrhosis ([place])   . Cholelithiasis 01/09/12  . Chronic kidney disease, stage 3   . Diverticulosis of colon with hemorrhage 06/08/2000  . Esophageal stenosis 01/08/11  . Esophagitis 01/08/11  . GERD (gastroesophageal reflux disease)   . Hiatal hernia 01/08/11  . HTN (hypertension)   . Hx of adenomatous colonic polyps 01/08/11  . Hyperplastic colon polyp 06/08/2000, 07/31/2003, 01/08/11  . 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 Localized edema   . Long term (current) use of insulin ([place])   . Mixed hyperlipidemia   . Obesity   . Osteopenia   . Other iron deficiency anemias   . Other specified menopausal and perimenopausal disorders   . PAF (paroxysmal atrial fibrillation) (HCC)    occurring in the setting of bradycardia and mild first degree AV block followed by Dr. Jens Som  . Pain in thoracic spine   . Pure hypercholesterolemia   . Type II or unspecified type diabetes mellitus without mention of complication, not stated as uncontrolled   . Uterine fibroid 01/09/12    Past Surgical History:  Procedure Laterality Date  . BREAST CYST EXCISION     left  . CARDIOVERSION N/A 11/27/2017   Procedure: CARDIOVERSION;  Surgeon: Sanda Klein, MD;  [facility];  Service: Cardiovascular;  Laterality: N/A;  . COLONOSCOPY    .  INSERTION OF MESH  05/18/2012   Procedure: INSERTION OF MESH;  Surgeon: Adin Hector, MD;  Location[facility];  Service: General;  Laterality: N/A;  . LIVER BIOPSY  05/18/2012   Procedure: LIVER BIOPSY;  Surgeon: Adin Hector, MD;  Location: [facility];  Service: General;;  . POLYPECTOMY    . THYROIDECTOMY, PARTIAL  1980  . TUBAL LIGATION  1972  . VENTRAL HERNIA REPAIR  05/18/2012   Procedure: LAPAROSCOPIC VENTRAL HERNIA;  Surgeon: Adin Hector, MD;  Location: [facility];  Service: General;  Laterality: N/A;  Laparoscopic Ventral Wall Hernia with Mesh    Family History  Problem Relation Age of Onset  . Heart attack Father   . Stroke Mother   . Diabetes Sister        x 2  . Aneurysm Sister   . Stroke Sister   . Congestive Heart Failure Sister   . Diabetes Brother   . Kidney failure Other   . Hyperlipidemia Other   . Arrhythmia Other   . Congestive Heart Failure Other   . Hypertension Other   . Arthritis Other   . Colon cancer Neg Hx   . Esophageal cancer Neg Hx   . Stomach cancer Neg Hx   . Rectal cancer Neg Hx     Social History   Socioeconomic History  . Marital status: Married    Spouse  name: Not on file  . Number of children: 2  . Years of education: Not on file  . Highest education level: Not on file  Occupational History  . Occupation: retired    Fish farm manager: RETIRED  Tobacco Use  . Smoking status: Never Smoker  . Smokeless tobacco: Never Used  Vaping Use  . Vaping Use: Never used  Substance and Sexual Activity  . Alcohol use: No  . Drug use: No  . Sexual activity: Not Currently  Other Topics Concern  . Not on file  Social History Narrative  . Not on file   Social Determinants of Health   Financial Resource Strain:   . Difficulty of Paying Living Expenses:   Food Insecurity:   . Worried About Charity fundraiser in the Last Year:   . Arboriculturist in the Last Year:   Transportation Needs:   . Film/video editor (Medical):   Marland Kitchen Lack of Transportation  (Non-Medical):   Physical Activity:   . Days of Exercise per Week:   . Minutes of Exercise per Session:   Stress:   . Feeling of Stress :   Social Connections:   . Frequency of Communication with Friends and Family:   . Frequency of Social Gatherings with Friends and Family:   . Attends Religious Services:   . Active Member of Clubs or Organizations:   . Attends Archivist Meetings:   Marland Kitchen Marital Status:   Intimate Partner Violence:   . Fear of Current or Ex-Partner:   . Emotionally Abused:   Marland Kitchen Physically Abused:   . Sexually Abused:     Outpatient Medications Prior to Visit  Medication Sig Dispense Refill  . tiZANidine (ZANAFLEX) 2 MG tablet Take 1 tablet (2 mg total) by mouth 3 (three) times daily. 90 tablet 2  . ACCU-CHEK AVIVA PLUS test strip USE TO TEST TWICE DAILY    . Accu-Chek FastClix Lancets MISC USE AS DIRECTED TO CHECK BLOOD SUGAR TWICE DAILY 204 each 3  . calcium-vitamin D (OSCAL WITH D) 250-125 MG-UNIT per tablet Take 1 tablet by mouth daily.    . Cholecalciferol (VITAMIN D3) 1000 units CAPS Take 1,000 Units by mouth daily.    . Ferrous Sulfate (IRON) 325 (65 FE) MG TABS Take 325 mg by mouth daily. 30 each 0  . fexofenadine (ALLEGRA) 180 MG tablet Take 180 mg by mouth daily.     . fluticasone (FLONASE) 50 MCG/ACT nasal spray Place into both nostrils daily.    . folic acid (FOLVITE) 993 MCG tablet Take 400 mcg by mouth daily.    . furosemide (LASIX) 40 MG tablet TAKE 1 TABLET BY MOUTH TWICE DAILY 180 tablet 1  . hydrALAZINE (APRESOLINE) 100 MG tablet TAKE 1 TABLET BY MOUTH IN THE MORNING, NOON AND AT BEDTIME 270 tablet 0  . hydrochlorothiazide (HYDRODIURIL) 25 MG tablet TAKE 1 TABLET(25 MG) BY MOUTH DAILY 90 tablet 0  . insulin detemir (LEVEMIR FLEXPEN) 100 UNIT/ML injection Inject into the skin as directed. 18 units at bedtime and 32 units in the a.m.    . irbesartan (AVAPRO) 300 MG tablet TAKE 1 TABLET BY MOUTH DAILY 90 tablet 1  . JANUMET 50-1000 MG tablet  TAKE 1 TABLET BY MOUTH TWICE DAILY WITH MEALS 180 tablet 1  . ketoconazole (NIZORAL) 2 % cream Apply 1 application topically daily as needed. Around skin folds for irritation    . LEVEMIR FLEXTOUCH 100 UNIT/ML FlexPen INJECT 32 UNITS UNDER THE SKIN EVERY  MORNING AND 18 UNITS WITH SUPPER 45 mL 1  . metoprolol tartrate (LOPRESSOR) 100 MG tablet Take 1 tablet (100 mg total) by mouth 2 (two) times daily. 180 tablet 0  . Multiple Vitamin (MULTIVITAMIN WITH MINERALS) TABS Take 1 tablet by mouth daily.    . pantoprazole (PROTONIX) 40 MG tablet TAKE 1 TABLET(40 MG) BY MOUTH DAILY 90 tablet 3  . Potassium Chloride ER 20 MEQ TBCR TAKE 1 TABLET BY MOUTH DAILY 90 tablet 0  . simvastatin (ZOCOR) 40 MG tablet TAKE 1 TABLET BY MOUTH AT BEDTIME 90 tablet 1  . ursodiol (ACTIGALL) 300 MG capsule TAKE 1 CAPSULE(300 MG) BY MOUTH TWICE DAILY 180 capsule 0  . vitamin B-12 (CYANOCOBALAMIN) 1000 MCG tablet Take 1,000 mcg by mouth daily.    . vitamin C (ASCORBIC ACID) 500 MG tablet Take 500 mg by mouth daily.    . vitamin E 400 UNIT capsule Take 400 Units by mouth daily.    Marland Kitchen warfarin (COUMADIN) 5 MG tablet TAKE AS DIRECTED BY COUMADIN CLINIC 90 tablet 1   No facility-administered medications prior to visit.    Allergies  Allergen Reactions  . Ace Inhibitors Anaphylaxis  . Capoten [Captopril] Anaphylaxis    Throat swells shut  . Neomycin-Bacitracin Zn-Polymyx Rash    Review of Systems  Constitutional: Negative for chills, fatigue and fever.  HENT: Negative for congestion, ear pain and sore throat.   Respiratory: Positive for shortness of breath. Negative for cough.   Cardiovascular: Positive for leg swelling (swelling). Negative for chest pain.  Neurological: Negative for dizziness and headaches.       Objective:    Physical Exam Vitals reviewed.  Constitutional:      Appearance: Normal appearance. She is obese.  Cardiovascular:     Rate and Rhythm: Normal rate and regular rhythm.     Pulses: Normal  pulses.     Heart sounds: Normal heart sounds.  Pulmonary:     Effort: Pulmonary effort is normal. No respiratory distress.     Breath sounds: Normal breath sounds.  Musculoskeletal:     Right lower leg: Edema present.     Left lower leg: Edema present.  Neurological:     Mental Status: She is alert and oriented to person, place, and time.  Psychiatric:        Mood and Affect: Mood normal.        Behavior: Behavior normal.     BP 136/80   Pulse 93   Temp (!) 97.4 F (36.3 C)   Ht 5' 2.5" (1.588 m)   Wt 181 lb (82.1 kg)   SpO2 96%   BMI 32.58 kg/m  Wt Readings from Last 3 Encounters:  12/15/19 181 lb (82.1 kg)  12/13/19 180 lb (81.6 kg)  09/07/19 169 lb (76.7 kg)    Health Maintenance Due  Topic Date Due  . COVID-19 Vaccine (1) Never done  . INFLUENZA VACCINE  12/11/2019    There are no preventive care reminders to display for this patient.   Lab Results  Component Value Date   TSH 1.67 02/26/2017   Lab Results  Component Value Date   WBC 6.0 12/13/2019   HGB 9.9 (L) 12/13/2019   HCT 29.6 (L) 12/13/2019   MCV 96 12/13/2019   PLT 209 12/13/2019   Lab Results  Component Value Date   NA 143 12/09/2019   K 3.6 12/09/2019   CO2 20 12/09/2019   GLUCOSE 138 (H) 12/09/2019   BUN 38 (H) 12/09/2019  CREATININE 1.57 (H) 12/09/2019   BILITOT 0.3 12/09/2019   ALKPHOS 96 12/09/2019   AST 27 12/09/2019   ALT 23 12/09/2019   PROT 6.6 12/09/2019   ALBUMIN 4.0 12/09/2019   CALCIUM 9.7 12/09/2019   GFR 21.32 (L) 11/29/2019   Lab Results  Component Value Date   CHOL 110 12/09/2019   Lab Results  Component Value Date   HDL 48 12/09/2019   Lab Results  Component Value Date   LDLCALC 46 12/09/2019   Lab Results  Component Value Date   TRIG 78 12/09/2019   Lab Results  Component Value Date   CHOLHDL 2.3 12/09/2019   Lab Results  Component Value Date   HGBA1C 7.3 (H) 12/09/2019       Assessment & Plan:  1. Shortness of breath - torsemide  (DEMADEX) 20 MG tablet; Take 2 tablets (40 mg total) by mouth daily.  Dispense: 60 tablet; Refill: 0  2. Pedal edema Discontinue lasix. Start on torsemide 20 mg 2 daily. - torsemide (DEMADEX) 20 MG tablet; Take 2 tablets (40 mg total) by mouth daily.  Dispense: 60 tablet; Refill: 0  3. Acute on chronic congestive heart failure, unspecified heart failure type [facility])   I spoke with cardiology and we are getting her an appointment locally. Needs Echo.  Meds ordered this encounter  Medications  . torsemide (DEMADEX) 20 MG tablet    Sig: Take 2 tablets (40 mg total) by mouth daily.    Dispense:  60 tablet    Refill:  0    Discontinue lasix prescriptions on file.    No orders of the defined types were placed in this encounter.    Follow-up: Return in about 2 weeks (around 12/29/2019).  An After Visit Summary was printed and given to the patient.  Rochel Brome [facility] [phone]

## 2019-12-15 NOTE — Telephone Encounter (Signed)
Laquan with Dr. Alyse Low office is calling to follow up in regards to the patient. She states her proBNP is at 7582. She would like to speak with Dr. Caryl Comes or another provider to discuss what may be done.

## 2019-12-15 NOTE — Addendum Note (Signed)
Addended by: Harland German A on: 12/15/2019 09:33 AM   Modules accepted: Orders

## 2019-12-15 NOTE — Telephone Encounter (Signed)
Dr. Burt Knack (DOD) spoke with Dr. Tobie Poet.  Per Dr. Burt Knack, will arrange echo and next available appointment in [place].

## 2019-12-15 NOTE — Patient Instructions (Addendum)
Stop lasix. Start on Torsemide 20 mg two daily.

## 2019-12-15 NOTE — Telephone Encounter (Signed)
Left message for Dr. Alyse Low office to call back.

## 2019-12-18 ENCOUNTER — Encounter: Payer: Self-pay | Admitting: Family Medicine

## 2019-12-19 ENCOUNTER — Encounter: Payer: Self-pay | Admitting: Family Medicine

## 2019-12-19 ENCOUNTER — Ambulatory Visit (INDEPENDENT_AMBULATORY_CARE_PROVIDER_SITE_OTHER): Payer: Medicare Other | Admitting: Family Medicine

## 2019-12-19 ENCOUNTER — Other Ambulatory Visit: Payer: Self-pay

## 2019-12-19 VITALS — BP 138/74 | HR 115 | Temp 97.2°F | Ht 62.5 in | Wt 180.0 lb

## 2019-12-19 DIAGNOSIS — L03116 Cellulitis of left lower limb: Secondary | ICD-10-CM

## 2019-12-19 DIAGNOSIS — R600 Localized edema: Secondary | ICD-10-CM | POA: Diagnosis not present

## 2019-12-19 MED ORDER — CEPHALEXIN 500 MG PO CAPS
500.0000 mg | ORAL_CAPSULE | Freq: Two times a day (BID) | ORAL | 0 refills | Status: DC
Start: 2019-12-19 — End: 2020-01-09

## 2019-12-19 NOTE — Progress Notes (Signed)
Acute Office Visit  Subjective:    Patient ID: Sara Hancock, female    DOB: 03-Feb-1935, 85 y.o.   MRN: [id number]  Chief Complaint  Patient presents with  . Leg Swelling    HPI Patient is in today for Patient states that since Saturday her left lower leg has been "weeping" a clear fluid. She currently has a bandage over the area however both of her legs are sensitive.  Past Medical History:  Diagnosis Date  . Anemia   . Arthritis   . Biliary cirrhosis ([place])   . Cholelithiasis 01/09/12  . Chronic kidney disease, stage 3   . Diverticulosis of colon with hemorrhage 06/08/2000  . Esophageal stenosis 01/08/11  . Esophagitis 01/08/11  . GERD (gastroesophageal reflux disease)   . Hiatal hernia 01/08/11  . HTN (hypertension)   . Hx of adenomatous colonic polyps 01/08/11  . Hyperplastic colon polyp 06/08/2000, 07/31/2003, 01/08/11  . 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 Localized edema   . Long term (current) use of insulin ([place])   . Mixed hyperlipidemia   . Obesity   . Osteopenia   . Other iron deficiency anemias   . Other specified menopausal and perimenopausal disorders   . PAF (paroxysmal atrial fibrillation) (HCC)    occurring in the setting of bradycardia and mild first degree AV block followed by Dr. Jens Som  . Pain in thoracic spine   . Pure hypercholesterolemia   . Type II or unspecified type diabetes mellitus without mention of complication, not stated as uncontrolled   . Uterine fibroid 01/09/12    Past Surgical History:  Procedure Laterality Date  . BREAST CYST EXCISION     left  . CARDIOVERSION N/A 11/27/2017   Procedure: CARDIOVERSION;  Surgeon: Sanda Klein, MD;  Location: [facility];  Service: Cardiovascular;  Laterality: N/A;  . COLONOSCOPY    . INSERTION OF MESH  05/18/2012   Procedure: INSERTION OF MESH;  Surgeon: Adin Hector,  MD;  Location: [facility];  Service: General;  Laterality: N/A;  . LIVER BIOPSY  05/18/2012   Procedure: LIVER BIOPSY;  Surgeon: Adin Hector, MD;  Location: [facility];  Service: General;;  . POLYPECTOMY    . THYROIDECTOMY, PARTIAL  1980  . TUBAL LIGATION  1972  . VENTRAL HERNIA REPAIR  05/18/2012   Procedure: LAPAROSCOPIC VENTRAL HERNIA;  Surgeon: Adin Hector, MD;  Location: [facility];  Service: General;  Laterality: N/A;  Laparoscopic Ventral Wall Hernia with Mesh    Family History  Problem Relation Age of Onset  . Heart attack Father   . Stroke Mother   . Diabetes Sister        x 2  . Aneurysm Sister   . Stroke Sister   . Congestive Heart Failure Sister   . Diabetes Brother   . Kidney failure Other   . Hyperlipidemia Other   . Arrhythmia Other   . Congestive Heart Failure Other   . Hypertension Other   . Arthritis Other   . Colon cancer Neg Hx   . Esophageal cancer Neg Hx   . Stomach cancer Neg Hx   . Rectal cancer Neg Hx     Social History   Socioeconomic History  . Marital status: Married    Spouse name: Not on file  . Number of children: 2  . Years of education: Not on file  .  Highest education level: Not on file  Occupational History  . Occupation: retired    Fish farm manager: RETIRED  Tobacco Use  . Smoking status: Never Smoker  . Smokeless tobacco: Never Used  Vaping Use  . Vaping Use: Never used  Substance and Sexual Activity  . Alcohol use: No  . Drug use: No  . Sexual activity: Not Currently  Other Topics Concern  . Not on file  Social History Narrative  . Not on file   Social Determinants of Health   Financial Resource Strain:   . Difficulty of Paying Living Expenses:   Food Insecurity:   . Worried About Charity fundraiser in the Last Year:   . Arboriculturist in the Last Year:   Transportation Needs:   . Film/video editor (Medical):   Marland Kitchen Lack of Transportation (Non-Medical):   Physical Activity:   . Days of Exercise per Week:   . Minutes of  Exercise per Session:   Stress:   . Feeling of Stress :   Social Connections:   . Frequency of Communication with Friends and Family:   . Frequency of Social Gatherings with Friends and Family:   . Attends Religious Services:   . Active Member of Clubs or Organizations:   . Attends Archivist Meetings:   Marland Kitchen Marital Status:   Intimate Partner Violence:   . Fear of Current or Ex-Partner:   . Emotionally Abused:   Marland Kitchen Physically Abused:   . Sexually Abused:     Outpatient Medications Prior to Visit  Medication Sig Dispense Refill  . tiZANidine (ZANAFLEX) 2 MG tablet Take 1 tablet (2 mg total) by mouth 3 (three) times daily. 90 tablet 2  . ACCU-CHEK AVIVA PLUS test strip USE TO TEST TWICE DAILY    . Accu-Chek FastClix Lancets MISC USE AS DIRECTED TO CHECK BLOOD SUGAR TWICE DAILY 204 each 3  . calcium-vitamin D (OSCAL WITH D) 250-125 MG-UNIT per tablet Take 1 tablet by mouth daily.    . Cholecalciferol (VITAMIN D3) 1000 units CAPS Take 1,000 Units by mouth daily.    . Ferrous Sulfate (IRON) 325 (65 FE) MG TABS Take 325 mg by mouth daily. 30 each 0  . fexofenadine (ALLEGRA) 180 MG tablet Take 180 mg by mouth daily.     . fluticasone (FLONASE) 50 MCG/ACT nasal spray Place into both nostrils daily.    . folic acid (FOLVITE) 782 MCG tablet Take 400 mcg by mouth daily.    . furosemide (LASIX) 40 MG tablet TAKE 1 TABLET BY MOUTH TWICE DAILY 180 tablet 1  . hydrALAZINE (APRESOLINE) 100 MG tablet TAKE 1 TABLET BY MOUTH IN THE MORNING, NOON AND AT BEDTIME 270 tablet 0  . hydrochlorothiazide (HYDRODIURIL) 25 MG tablet TAKE 1 TABLET(25 MG) BY MOUTH DAILY 90 tablet 0  . insulin detemir (LEVEMIR FLEXPEN) 100 UNIT/ML injection Inject into the skin as directed. 18 units at bedtime and 32 units in the a.m.    . irbesartan (AVAPRO) 300 MG tablet TAKE 1 TABLET BY MOUTH DAILY 90 tablet 1  . JANUMET 50-1000 MG tablet TAKE 1 TABLET BY MOUTH TWICE DAILY WITH MEALS 180 tablet 1  . ketoconazole (NIZORAL)  2 % cream Apply 1 application topically daily as needed. Around skin folds for irritation    . LEVEMIR FLEXTOUCH 100 UNIT/ML FlexPen INJECT 32 UNITS UNDER THE SKIN EVERY MORNING AND 18 UNITS WITH SUPPER 45 mL 1  . metoprolol tartrate (LOPRESSOR) 100 MG tablet Take 1 tablet (  100 mg total) by mouth 2 (two) times daily. 180 tablet 0  . Multiple Vitamin (MULTIVITAMIN WITH MINERALS) TABS Take 1 tablet by mouth daily.    . pantoprazole (PROTONIX) 40 MG tablet TAKE 1 TABLET(40 MG) BY MOUTH DAILY 90 tablet 3  . Potassium Chloride ER 20 MEQ TBCR TAKE 1 TABLET BY MOUTH DAILY 90 tablet 0  . simvastatin (ZOCOR) 40 MG tablet TAKE 1 TABLET BY MOUTH AT BEDTIME 90 tablet 1  . torsemide (DEMADEX) 20 MG tablet Take 2 tablets (40 mg total) by mouth daily. 60 tablet 0  . ursodiol (ACTIGALL) 300 MG capsule TAKE 1 CAPSULE(300 MG) BY MOUTH TWICE DAILY 180 capsule 0  . vitamin B-12 (CYANOCOBALAMIN) 1000 MCG tablet Take 1,000 mcg by mouth daily.    . vitamin C (ASCORBIC ACID) 500 MG tablet Take 500 mg by mouth daily.    . vitamin E 400 UNIT capsule Take 400 Units by mouth daily.    Marland Kitchen warfarin (COUMADIN) 5 MG tablet TAKE AS DIRECTED BY COUMADIN CLINIC 90 tablet 1   No facility-administered medications prior to visit.    Allergies  Allergen Reactions  . Ace Inhibitors Anaphylaxis  . Capoten [Captopril] Anaphylaxis    Throat swells shut  . Neomycin-Bacitracin Zn-Polymyx Rash    Review of Systems  Constitutional: Negative for chills, fatigue and fever.  HENT: Negative for congestion, ear pain, rhinorrhea and sore throat.   Respiratory: Negative for cough and shortness of breath.   Cardiovascular: Positive for leg swelling. Negative for chest pain.       Objective:    Physical Exam Vitals reviewed.  Constitutional:      Appearance: Normal appearance. She is normal weight.  Cardiovascular:     Rate and Rhythm: Normal rate and regular rhythm.     Pulses: Normal pulses.     Heart sounds: Normal heart  sounds.  Pulmonary:     Effort: Pulmonary effort is normal. No respiratory distress.     Breath sounds: Normal breath sounds.  Abdominal:     General: Abdomen is flat. Bowel sounds are normal.     Palpations: Abdomen is soft.     Tenderness: There is no abdominal tenderness.  Musculoskeletal:     Right lower leg: Edema present.     Left lower leg: Edema present.  Skin:    Findings: Erythema (BL cellulitis (Left>right)) present.  Neurological:     Mental Status: She is alert and oriented to person, place, and time.  Psychiatric:        Mood and Affect: Mood normal.        Behavior: Behavior normal.     BP 138/74   Pulse (!) 115   Temp (!) 97.2 F (36.2 C)   Ht 5' 2.5" (1.588 m)   Wt 180 lb (81.6 kg)   SpO2 97%   BMI 32.40 kg/m  Wt Readings from Last 3 Encounters:  12/23/19 181 lb 3.2 oz (82.2 kg)  12/19/19 180 lb (81.6 kg)  12/15/19 181 lb (82.1 kg)    Health Maintenance Due  Topic Date Due  . COVID-19 Vaccine (1) Never done  . INFLUENZA VACCINE  12/11/2019    There are no preventive care reminders to display for this patient.   Lab Results  Component Value Date   TSH 1.67 02/26/2017   Lab Results  Component Value Date   WBC 6.0 12/13/2019   HGB 9.9 (L) 12/13/2019   HCT 29.6 (L) 12/13/2019   MCV 96 12/13/2019  PLT 209 12/13/2019   Lab Results  Component Value Date   NA 143 12/09/2019   K 3.6 12/09/2019   CO2 20 12/09/2019   GLUCOSE 138 (H) 12/09/2019   BUN 38 (H) 12/09/2019   CREATININE 1.57 (H) 12/09/2019   BILITOT 0.3 12/09/2019   ALKPHOS 96 12/09/2019   AST 27 12/09/2019   ALT 23 12/09/2019   PROT 6.6 12/09/2019   ALBUMIN 4.0 12/09/2019   CALCIUM 9.7 12/09/2019   GFR 21.32 (L) 11/29/2019   Lab Results  Component Value Date   CHOL 110 12/09/2019   Lab Results  Component Value Date   HDL 48 12/09/2019   Lab Results  Component Value Date   LDLCALC 46 12/09/2019   Lab Results  Component Value Date   TRIG 78 12/09/2019   Lab  Results  Component Value Date   CHOLHDL 2.3 12/09/2019   Lab Results  Component Value Date   HGBA1C 7.3 (H) 12/09/2019       Assessment & Plan:  1. Pedal edema 2. Cellulitis of left leg   Unna boots applied BL.  Meds ordered this encounter  Medications  . cephALEXin (KEFLEX) 500 MG capsule    Sig: Take 1 capsule (500 mg total) by mouth 2 (two) times daily.    Dispense:  14 capsule    Refill:  0    No orders of the defined types were placed in this encounter.    Follow-up: Return in about 4 days (around 12/23/2019) for for unna boot removal.  An After Visit Summary was printed and given to the patient.  Rochel Brome [facility] [phone]

## 2019-12-23 ENCOUNTER — Ambulatory Visit (INDEPENDENT_AMBULATORY_CARE_PROVIDER_SITE_OTHER): Payer: Medicare Other | Admitting: Family Medicine

## 2019-12-23 ENCOUNTER — Other Ambulatory Visit: Payer: Self-pay

## 2019-12-23 VITALS — BP 124/70 | HR 88 | Temp 97.5°F | Resp 18 | Ht 62.5 in | Wt 181.2 lb

## 2019-12-23 DIAGNOSIS — R600 Localized edema: Secondary | ICD-10-CM | POA: Diagnosis not present

## 2019-12-24 ENCOUNTER — Encounter: Payer: Self-pay | Admitting: Family Medicine

## 2019-12-24 NOTE — Progress Notes (Signed)
Subjective:  Patient ID: Sara Hancock, female    DOB: 15-May-1934  Age: 85 y.o. MRN: [id number]  Chief Complaint  Patient presents with  . pedal edema    HPI Patient presents for follow up of unna boots. She tolerated them.    Current Outpatient Medications on File Prior to Visit  Medication Sig Dispense Refill  . tiZANidine (ZANAFLEX) 2 MG tablet Take 1 tablet (2 mg total) by mouth 3 (three) times daily. 90 tablet 2  . ACCU-CHEK AVIVA PLUS test strip USE TO TEST TWICE DAILY    . Accu-Chek FastClix Lancets MISC USE AS DIRECTED TO CHECK BLOOD SUGAR TWICE DAILY 204 each 3  . calcium-vitamin D (OSCAL WITH D) 250-125 MG-UNIT per tablet Take 1 tablet by mouth daily.    . cephALEXin (KEFLEX) 500 MG capsule Take 1 capsule (500 mg total) by mouth 2 (two) times daily. 14 capsule 0  . Cholecalciferol (VITAMIN D3) 1000 units CAPS Take 1,000 Units by mouth daily.    . Ferrous Sulfate (IRON) 325 (65 FE) MG TABS Take 325 mg by mouth daily. 30 each 0  . fexofenadine (ALLEGRA) 180 MG tablet Take 180 mg by mouth daily.     . fluticasone (FLONASE) 50 MCG/ACT nasal spray Place into both nostrils daily.    . folic acid (FOLVITE) 248 MCG tablet Take 400 mcg by mouth daily.    . furosemide (LASIX) 40 MG tablet TAKE 1 TABLET BY MOUTH TWICE DAILY 180 tablet 1  . hydrALAZINE (APRESOLINE) 100 MG tablet TAKE 1 TABLET BY MOUTH IN THE MORNING, NOON AND AT BEDTIME 270 tablet 0  . hydrochlorothiazide (HYDRODIURIL) 25 MG tablet TAKE 1 TABLET(25 MG) BY MOUTH DAILY 90 tablet 0  . insulin detemir (LEVEMIR FLEXPEN) 100 UNIT/ML injection Inject into the skin as directed. 18 units at bedtime and 32 units in the a.m.    . irbesartan (AVAPRO) 300 MG tablet TAKE 1 TABLET BY MOUTH DAILY 90 tablet 1  . JANUMET 50-1000 MG tablet TAKE 1 TABLET BY MOUTH TWICE DAILY WITH MEALS 180 tablet 1  . ketoconazole (NIZORAL) 2 % cream Apply 1 application topically daily as needed. Around skin folds for irritation    . LEVEMIR FLEXTOUCH  100 UNIT/ML FlexPen INJECT 32 UNITS UNDER THE SKIN EVERY MORNING AND 18 UNITS WITH SUPPER 45 mL 1  . metoprolol tartrate (LOPRESSOR) 100 MG tablet Take 1 tablet (100 mg total) by mouth 2 (two) times daily. 180 tablet 0  . Multiple Vitamin (MULTIVITAMIN WITH MINERALS) TABS Take 1 tablet by mouth daily.    . pantoprazole (PROTONIX) 40 MG tablet TAKE 1 TABLET(40 MG) BY MOUTH DAILY 90 tablet 3  . Potassium Chloride ER 20 MEQ TBCR TAKE 1 TABLET BY MOUTH DAILY 90 tablet 0  . simvastatin (ZOCOR) 40 MG tablet TAKE 1 TABLET BY MOUTH AT BEDTIME 90 tablet 1  . torsemide (DEMADEX) 20 MG tablet Take 2 tablets (40 mg total) by mouth daily. 60 tablet 0  . ursodiol (ACTIGALL) 300 MG capsule TAKE 1 CAPSULE(300 MG) BY MOUTH TWICE DAILY 180 capsule 0  . vitamin B-12 (CYANOCOBALAMIN) 1000 MCG tablet Take 1,000 mcg by mouth daily.    . vitamin C (ASCORBIC ACID) 500 MG tablet Take 500 mg by mouth daily.    . vitamin E 400 UNIT capsule Take 400 Units by mouth daily.    Marland Kitchen warfarin (COUMADIN) 5 MG tablet TAKE AS DIRECTED BY COUMADIN CLINIC 90 tablet 1   No current facility-administered medications on file  prior to visit.   Past Medical History:  Diagnosis Date  . Anemia   . Arthritis   . Biliary cirrhosis ([place])   . Cholelithiasis 01/09/12  . Chronic kidney disease, stage 3   . Diverticulosis of colon with hemorrhage 06/08/2000  . Esophageal stenosis 01/08/11  . Esophagitis 01/08/11  . GERD (gastroesophageal reflux disease)   . Hiatal hernia 01/08/11  . HTN (hypertension)   . Hx of adenomatous colonic polyps 01/08/11  . Hyperplastic colon polyp 06/08/2000, 07/31/2003, 01/08/11  . 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 Localized edema   . Long term (current) use of insulin ([place])   . Mixed hyperlipidemia   . Obesity   . Osteopenia   . Other iron deficiency anemias   . Other specified  menopausal and perimenopausal disorders   . PAF (paroxysmal atrial fibrillation) (H[facility])    occurring in the setting of bradycardia and mild first degree AV block followed by Dr. Jens Som  . Pain in thoracic spine   . Pure hypercholesterolemia   . Type II or unspecified type diabetes mellitus without mention of complication, not stated as uncontrolled   . Uterine fibroid 01/09/12   Past Surgical History:  Procedure Laterality Date  . BREAST CYST EXCISION     left  . CARDIOVERSION N/A 11/27/2017   Procedure: CARDIOVERSION;  Surgeon: Sanda Klein, MD;  Location: [facility];  Service: Cardiovascular;  Laterality: N/A;  . COLONOSCOPY    . INSERTION OF MESH  05/18/2012   Procedure: INSERTION OF MESH;  Surgeon: Adin Hector, MD;  Location: [facility];  Service: General;  Laterality: N/A;  . LIVER BIOPSY  05/18/2012   Procedure: LIVER BIOPSY;  Surgeon: Adin Hector, MD;  Location: [facility];  Service: General;;  . POLYPECTOMY    . THYROIDECTOMY, PARTIAL  1980  . TUBAL LIGATION  1972  . VENTRAL HERNIA REPAIR  05/18/2012   Procedure: LAPAROSCOPIC VENTRAL HERNIA;  Surgeon: Adin Hector, MD;  Location: [facility];  Service: General;  Laterality: N/A;  Laparoscopic Ventral Wall Hernia with Mesh    Family History  Problem Relation Age of Onset  . Heart attack Father   . Stroke Mother   . Diabetes Sister        x 2  . Aneurysm Sister   . Stroke Sister   . Congestive Heart Failure Sister   . Diabetes Brother   . Kidney failure Other   . Hyperlipidemia Other   . Arrhythmia Other   . Congestive Heart Failure Other   . Hypertension Other   . Arthritis Other   . Colon cancer Neg Hx   . Esophageal cancer Neg Hx   . Stomach cancer Neg Hx   . Rectal cancer Neg Hx    Social History   Socioeconomic History  . Marital status: Married    Spouse name: Not on file  . Number of children: 2  . Years of education: Not on file  . Highest education level: Not on file  Occupational History  . Occupation:  retired    Fish farm manager: RETIRED  Tobacco Use  . Smoking status: Never Smoker  . Smokeless tobacco: Never Used  Vaping Use  . Vaping Use: Never used  Substance and Sexual Activity  . Alcohol use: No  . Drug use: No  . Sexual activity: Not Currently  Other Topics Concern  . Not on  file  Social History Narrative  . Not on file   Social Determinants of Health   Financial Resource Strain:   . Difficulty of Paying Living Expenses:   Food Insecurity:   . Worried About Charity fundraiser in the Last Year:   . Arboriculturist in the Last Year:   Transportation Needs:   . Film/video editor (Medical):   Marland Kitchen Lack of Transportation (Non-Medical):   Physical Activity:   . Days of Exercise per Week:   . Minutes of Exercise per Session:   Stress:   . Feeling of Stress :   Social Connections:   . Frequency of Communication with Friends and Family:   . Frequency of Social Gatherings with Friends and Family:   . Attends Religious Services:   . Active Member of Clubs or Organizations:   . Attends Archivist Meetings:   Marland Kitchen Marital Status:     Review of Systems  Constitutional: Negative for fever.  Respiratory: Negative for cough and shortness of breath.      Objective:  BP 124/70   Pulse 88   Temp (!) 97.5 F (36.4 C)   Resp 18   Ht 5' 2.5" (1.588 m)   Wt 181 lb 3.2 oz (82.2 kg)   BMI 32.61 kg/m   BP/Weight 12/23/2019 08/18/163 09/11/7480  Systolic BP 707 867 544  Diastolic BP 70 74 80  Wt. (Lbs) 181.2 180 181  BMI 32.61 32.4 32.58    Physical Exam Vitals reviewed.  Constitutional:      Appearance: Normal appearance. She is obese.  Musculoskeletal:     Right lower leg: Edema present.     Left lower leg: Edema present.     Comments: Improved with Unna boots.   Skin:    Findings: Erythema (greatly improved. ) present.  Neurological:     Mental Status: She is alert.     Diabetic Foot Exam - Simple   No data filed       Lab Results  Component Value  Date   WBC 6.0 12/13/2019   HGB 9.9 (L) 12/13/2019   HCT 29.6 (L) 12/13/2019   PLT 209 12/13/2019   GLUCOSE 138 (H) 12/09/2019   CHOL 110 12/09/2019   TRIG 78 12/09/2019   HDL 48 12/09/2019   LDLCALC 46 12/09/2019   ALT 23 12/09/2019   AST 27 12/09/2019   NA 143 12/09/2019   K 3.6 12/09/2019   CL 106 12/09/2019   CREATININE 1.57 (H) 12/09/2019   BUN 38 (H) 12/09/2019   CO2 20 12/09/2019   TSH 1.67 02/26/2017   INR 2.5 12/13/2019   HGBA1C 7.3 (H) 12/09/2019      Assessment & Plan:   1. Pedal edema  Unna boots BL applied.     Follow-up: Return in about 4 days (around 12/27/2019).  An After Visit Summary was printed and given to the patient.  Rochel Brome [facility] [phone]

## 2019-12-27 ENCOUNTER — Ambulatory Visit: Payer: Medicare Other | Admitting: Family Medicine

## 2019-12-27 ENCOUNTER — Other Ambulatory Visit: Payer: Self-pay

## 2019-12-27 ENCOUNTER — Other Ambulatory Visit: Payer: Self-pay | Admitting: Family Medicine

## 2019-12-27 ENCOUNTER — Other Ambulatory Visit: Payer: Self-pay | Admitting: Gastroenterology

## 2019-12-27 ENCOUNTER — Ambulatory Visit (INDEPENDENT_AMBULATORY_CARE_PROVIDER_SITE_OTHER): Payer: Medicare Other | Admitting: Family Medicine

## 2019-12-27 VITALS — BP 138/90 | HR 100 | Temp 97.7°F | Resp 18 | Ht 63.0 in | Wt 177.2 lb

## 2019-12-27 DIAGNOSIS — N184 Chronic kidney disease, stage 4 (severe): Secondary | ICD-10-CM

## 2019-12-27 DIAGNOSIS — R194 Change in bowel habit: Secondary | ICD-10-CM | POA: Diagnosis not present

## 2019-12-27 DIAGNOSIS — E1122 Type 2 diabetes mellitus with diabetic chronic kidney disease: Secondary | ICD-10-CM

## 2019-12-27 DIAGNOSIS — R0602 Shortness of breath: Secondary | ICD-10-CM | POA: Diagnosis not present

## 2019-12-27 DIAGNOSIS — I10 Essential (primary) hypertension: Secondary | ICD-10-CM | POA: Diagnosis not present

## 2019-12-27 DIAGNOSIS — R600 Localized edema: Secondary | ICD-10-CM

## 2019-12-27 MED ORDER — HYDRALAZINE HCL 100 MG PO TABS: 100.0000 mg | ORAL_TABLET | Freq: Four times a day (QID) | ORAL | 2 refills | Status: DC

## 2019-12-27 NOTE — Progress Notes (Signed)
Subjective:  Patient ID: Sara Hancock, female    DOB: June 16, 1934  Age: 85 y.o. MRN: [id number]  Chief Complaint  Patient presents with  . Edema    HPI Patient presents for follow-up of lower extremity edema.  The patient has worn her second round of Unna boots and is doing very well.  Her legs have significantly decreased in size.  Cellulitis which was on her left leg has resolved.  The patient had elevated creatinine in July which have worsened at which time we discontinued her HCTZ and held her Lasix.  Her Lasix subsequently had to be restarted.  Her Lasix was then changed to torsemide as her edema was not responding to the Lasix.  Patient is due for a chemistry panel to be rechecked.  Patient's blood pressure has been elevated in the mornings 170s over 100s.  It does come down after she takes her medication.  She is currently on hydralazine 100 mg 3 times a day Toprol-XL 100 mg once a day and hydralazine 100 mg 3 times a day.  Current Outpatient Medications on File Prior to Visit  Medication Sig Dispense Refill  . tiZANidine (ZANAFLEX) 2 MG tablet Take 1 tablet (2 mg total) by mouth 3 (three) times daily. 90 tablet 2  . ACCU-CHEK AVIVA PLUS test strip USE TO TEST TWICE DAILY    . Accu-Chek FastClix Lancets MISC USE AS DIRECTED TO CHECK BLOOD SUGAR TWICE DAILY 204 each 3  . calcium-vitamin D (OSCAL WITH D) 250-125 MG-UNIT per tablet Take 1 tablet by mouth daily.    . cephALEXin (KEFLEX) 500 MG capsule Take 1 capsule (500 mg total) by mouth 2 (two) times daily. 14 capsule 0  . Cholecalciferol (VITAMIN D3) 1000 units CAPS Take 1,000 Units by mouth daily.    . Ferrous Sulfate (IRON) 325 (65 FE) MG TABS Take 325 mg by mouth daily. 30 each 0  . fexofenadine (ALLEGRA) 180 MG tablet Take 180 mg by mouth daily.     . fluticasone (FLONASE) 50 MCG/ACT nasal spray Place into both nostrils daily.    . folic acid (FOLVITE) 671 MCG tablet Take 400 mcg by mouth daily.    . hydrALAZINE  (APRESOLINE) 100 MG tablet TAKE 1 TABLET BY MOUTH IN THE MORNING, NOON AND AT BEDTIME 270 tablet 0  . insulin detemir (LEVEMIR FLEXPEN) 100 UNIT/ML injection Inject into the skin as directed. 18 units at bedtime and 32 units in the a.m.    Marland Kitchen ketoconazole (NIZORAL) 2 % cream Apply 1 application topically daily as needed. Around skin folds for irritation    . LEVEMIR FLEXTOUCH 100 UNIT/ML FlexPen INJECT 32 UNITS UNDER THE SKIN EVERY MORNING AND 18 UNITS WITH SUPPER 45 mL 1  . metoprolol tartrate (LOPRESSOR) 100 MG tablet Take 1 tablet (100 mg total) by mouth 2 (two) times daily. 180 tablet 0  . Multiple Vitamin (MULTIVITAMIN WITH MINERALS) TABS Take 1 tablet by mouth daily.    . Potassium Chloride ER 20 MEQ TBCR TAKE 1 TABLET BY MOUTH DAILY 90 tablet 0  . torsemide (DEMADEX) 20 MG tablet Take 2 tablets (40 mg total) by mouth daily. 60 tablet 0  . vitamin B-12 (CYANOCOBALAMIN) 1000 MCG tablet Take 1,000 mcg by mouth daily.    . vitamin C (ASCORBIC ACID) 500 MG tablet Take 500 mg by mouth daily.    . vitamin E 400 UNIT capsule Take 400 Units by mouth daily.    Marland Kitchen warfarin (COUMADIN) 5 MG tablet TAKE AS  DIRECTED BY COUMADIN CLINIC 90 tablet 1   No current facility-administered medications on file prior to visit.   Past Medical History:  Diagnosis Date  . Anemia   . Arthritis   . Biliary cirrhosis ([place])   . Cholelithiasis 01/09/12  . Chronic kidney disease, stage 3   . Diverticulosis of colon with hemorrhage 06/08/2000  . Esophageal stenosis 01/08/11  . Esophagitis 01/08/11  . GERD (gastroesophageal reflux disease)   . Hiatal hernia 01/08/11  . HTN (hypertension)   . Hx of adenomatous colonic polyps 01/08/11  . Hyperplastic colon polyp 06/08/2000, 07/31/2003, 01/08/11  . 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 Localized edema   . Long term (current) use of insulin ([place])   .  Mixed hyperlipidemia   . Obesity   . Osteopenia   . Other iron deficiency anemias   . Other specified menopausal and perimenopausal disorders   . PAF (paroxysmal atrial fibrillation) ([facility])    occurring in the setting of bradycardia and mild first degree AV block followed by Dr. Jens Som  . Pain in thoracic spine   . Pure hypercholesterolemia   . Type II or unspecified type diabetes mellitus without mention of complication, not stated as uncontrolled   . Uterine fibroid 01/09/12   Past Surgical History:  Procedure Laterality Date  . BREAST CYST EXCISION     left  . CARDIOVERSION N/A 11/27/2017   Procedure: CARDIOVERSION;  Surgeon: Sanda Klein, MD;  Location: [facility];  Service: Cardiovascular;  Laterality: N/A;  . COLONOSCOPY    . INSERTION OF MESH  05/18/2012   Procedure: INSERTION OF MESH;  Surgeon: Adin Hector, MD;  Location: [facility];  Service: General;  Laterality: N/A;  . LIVER BIOPSY  05/18/2012   Procedure: LIVER BIOPSY;  Surgeon: Adin Hector, MD;  Location: [facility];  Service: General;;  . POLYPECTOMY    . THYROIDECTOMY, PARTIAL  1980  . TUBAL LIGATION  1972  . VENTRAL HERNIA REPAIR  05/18/2012   Procedure: LAPAROSCOPIC VENTRAL HERNIA;  Surgeon: Adin Hector, MD;  Location: [facility];  Service: General;  Laterality: N/A;  Laparoscopic Ventral Wall Hernia with Mesh    Family History  Problem Relation Age of Onset  . Heart attack Father   . Stroke Mother   . Diabetes Sister        x 2  . Aneurysm Sister   . Stroke Sister   . Congestive Heart Failure Sister   . Diabetes Brother   . Kidney failure Other   . Hyperlipidemia Other   . Arrhythmia Other   . Congestive Heart Failure Other   . Hypertension Other   . Arthritis Other   . Colon cancer Neg Hx   . Esophageal cancer Neg Hx   . Stomach cancer Neg Hx   . Rectal cancer Neg Hx    Social History   Socioeconomic History  . Marital status: Married    Spouse name: Not on file  . Number of children: 2  . Years  of education: Not on file  . Highest education level: Not on file  Occupational History  . Occupation: retired    Fish farm manager: RETIRED  Tobacco Use  . Smoking status: Never Smoker  . Smokeless tobacco: Never Used  Vaping Use  . Vaping Use: Never used  Substance and Sexual Activity  . Alcohol use: No  . Drug use:  No  . Sexual activity: Not Currently  Other Topics Concern  . Not on file  Social History Narrative  . Not on file   Social Determinants of Health   Financial Resource Strain:   . Difficulty of Paying Living Expenses: Not on file  Food Insecurity:   . Worried About Charity fundraiser in the Last Year: Not on file  . Ran Out of Food in the Last Year: Not on file  Transportation Needs:   . Lack of Transportation (Medical): Not on file  . Lack of Transportation (Non-Medical): Not on file  Physical Activity:   . Days of Exercise per Week: Not on file  . Minutes of Exercise per Session: Not on file  Stress:   . Feeling of Stress : Not on file  Social Connections:   . Frequency of Communication with Friends and Family: Not on file  . Frequency of Social Gatherings with Friends and Family: Not on file  . Attends Religious Services: Not on file  . Active Member of Clubs or Organizations: Not on file  . Attends Archivist Meetings: Not on file  . Marital Status: Not on file    Review of Systems  Constitutional: Negative for chills, fatigue and fever.  HENT: Negative for congestion, ear pain and sore throat.   Respiratory: Positive for shortness of breath (greatly improved. ). Negative for cough.   Cardiovascular: Negative for chest pain.  Gastrointestinal: Positive for diarrhea (seems to be in the morning after her milk.). Negative for abdominal pain and constipation.  Skin: Negative for rash.     Objective:  BP 138/90   Pulse 100   Temp 97.7 F (36.5 C)   Resp 18   Ht 5\' 3"  (1.6 m)   Wt 177 lb 3.2 oz (80.4 kg)   BMI 31.39 kg/m   BP/Weight  12/27/2019 9/38/1829 01/13/7168  Systolic BP 678 938 101  Diastolic BP 90 70 74  Wt. (Lbs) 177.2 181.2 180  BMI 31.39 32.61 32.4    Physical Exam Constitutional:      Appearance: Normal appearance. She is obese.  Cardiovascular:     Rate and Rhythm: Normal rate and regular rhythm.     Heart sounds: Normal heart sounds.  Pulmonary:     Effort: Pulmonary effort is normal.     Breath sounds: Normal breath sounds.  Musculoskeletal:     Right lower leg: Edema (trace) present.     Left lower leg: Edema (trace) present.  Skin:    Findings: No rash.  Neurological:     Mental Status: She is alert.     Lab Results  Component Value Date   WBC 6.0 12/13/2019   HGB 9.9 (L) 12/13/2019   HCT 29.6 (L) 12/13/2019   PLT 209 12/13/2019   GLUCOSE 123 (H) 12/27/2019   CHOL 110 12/09/2019   TRIG 78 12/09/2019   HDL 48 12/09/2019   LDLCALC 46 12/09/2019   ALT 16 12/27/2019   AST 24 12/27/2019   NA 145 (H) 12/27/2019   K 4.6 12/27/2019   CL 101 12/27/2019   CREATININE 1.58 (H) 12/27/2019   BUN 36 (H) 12/27/2019   CO2 26 12/27/2019   TSH 1.67 02/26/2017   INR 2.5 12/13/2019   HGBA1C 7.3 (H) 12/09/2019      Assessment & Plan:   1. Hypertension, essential  Not at goal. Increase hydralazine 100 mg one four times a day.  Check bp daily. Notify us if continues to be  high.  2. Pedal edema  Greatly improved. NO need for repeat unna boots.  The current medical regimen is effective;  continue present plan and medications.  3. Change in bowel habit  Trial off milk. 4. CKD stage 4 due to type 2 diabetes mellitus ([place])  - Comprehensive metabolic panel  5. Shortness of breath  - Pro b natriuretic peptide    Meds ordered this encounter  Medications  . hydrALAZINE (APRESOLINE) 100 MG tablet    Sig: Take 1 tablet (100 mg total) by mouth QID.    Dispense:  120 tablet    Refill:  2    Orders Placed This Encounter  Procedures  . Comprehensive metabolic panel  . Pro b natriuretic  peptide     Follow-up: Return in about 2 months (around 03/12/2020) for fasting.  Marland Kitchen  An After Visit Summary was printed and given to the patient.  Rochel Brome [facility] [phone]

## 2019-12-27 NOTE — Patient Instructions (Signed)
Increase hydralazine 100 mg one four times a day. Check bp daily.  Let us know if it continues to be high.  Take a break from Milk to see if this is contributing to loose stools.

## 2019-12-28 LAB — COMPREHENSIVE METABOLIC PANEL
ALT: 16 IU/L (ref 0–32)
AST: 24 IU/L (ref 0–40)
Albumin/Globulin Ratio: 1.7 (ref 1.2–2.2)
Albumin: 4.4 g/dL (ref 3.6–4.6)
Alkaline Phosphatase: 85 IU/L (ref 48–121)
BUN/Creatinine Ratio: 23 (ref 12–28)
BUN: 36 mg/dL — ABNORMAL HIGH (ref 8–27)
Bilirubin Total: 0.4 mg/dL (ref 0.0–1.2)
CO2: 26 mmol/L (ref 20–29)
Calcium: 9.5 mg/dL (ref 8.7–10.3)
Chloride: 101 mmol/L (ref 96–106)
Creatinine, Ser: 1.58 mg/dL — ABNORMAL HIGH (ref 0.57–1.00)
GFR calc Af Amer: 35 mL/min/{1.73_m2} — ABNORMAL LOW (ref >59)
GFR calc non Af Amer: 30 mL/min/{1.73_m2} — ABNORMAL LOW (ref >59)
Globulin, Total: 2.6 g/dL (ref 1.5–4.5)
Glucose: 123 mg/dL — ABNORMAL HIGH (ref 65–99)
Potassium: 4.6 mmol/L (ref 3.5–5.2)
Sodium: 145 mmol/L — ABNORMAL HIGH (ref 134–144)
Total Protein: 7 g/dL (ref 6.0–8.5)

## 2019-12-28 LAB — PRO B NATRIURETIC PEPTIDE: NT-Pro BNP: 4066 pg/mL — ABNORMAL HIGH (ref 0–738)

## 2019-12-29 ENCOUNTER — Ambulatory Visit: Payer: Medicare Other | Admitting: Family Medicine

## 2020-01-02 ENCOUNTER — Encounter: Payer: Self-pay | Admitting: Family Medicine

## 2020-01-05 ENCOUNTER — Other Ambulatory Visit: Payer: Self-pay

## 2020-01-05 ENCOUNTER — Ambulatory Visit (INDEPENDENT_AMBULATORY_CARE_PROVIDER_SITE_OTHER): Payer: Medicare Other

## 2020-01-05 DIAGNOSIS — R0602 Shortness of breath: Secondary | ICD-10-CM | POA: Diagnosis not present

## 2020-01-05 LAB — ECHOCARDIOGRAM COMPLETE
Area-P 1/2: 6.14 cm2
S' Lateral: 2.7 cm

## 2020-01-05 NOTE — Progress Notes (Signed)
Complete echocardiogram performed.  [name] RDCS, RVT  

## 2020-01-09 ENCOUNTER — Ambulatory Visit (INDEPENDENT_AMBULATORY_CARE_PROVIDER_SITE_OTHER): Payer: Medicare Other | Admitting: Cardiology

## 2020-01-09 ENCOUNTER — Encounter: Payer: Self-pay | Admitting: Cardiology

## 2020-01-09 ENCOUNTER — Other Ambulatory Visit: Payer: Self-pay

## 2020-01-09 VITALS — BP 132/80 | HR 89 | Ht 62.5 in | Wt 178.2 lb

## 2020-01-09 DIAGNOSIS — R0602 Shortness of breath: Secondary | ICD-10-CM

## 2020-01-09 DIAGNOSIS — R600 Localized edema: Secondary | ICD-10-CM | POA: Diagnosis not present

## 2020-01-09 DIAGNOSIS — I10 Essential (primary) hypertension: Secondary | ICD-10-CM | POA: Diagnosis not present

## 2020-01-09 DIAGNOSIS — I4819 Other persistent atrial fibrillation: Secondary | ICD-10-CM | POA: Diagnosis not present

## 2020-01-09 DIAGNOSIS — E119 Type 2 diabetes mellitus without complications: Secondary | ICD-10-CM

## 2020-01-09 DIAGNOSIS — E669 Obesity, unspecified: Secondary | ICD-10-CM

## 2020-01-09 LAB — BASIC METABOLIC PANEL
BUN/Creatinine Ratio: 27 (ref 12–28)
BUN: 42 mg/dL — ABNORMAL HIGH (ref 8–27)
CO2: 24 mmol/L (ref 20–29)
Calcium: 9.5 mg/dL (ref 8.7–10.3)
Chloride: 104 mmol/L (ref 96–106)
Creatinine, Ser: 1.57 mg/dL — ABNORMAL HIGH (ref 0.57–1.00)
GFR calc Af Amer: 35 mL/min/{1.73_m2} — ABNORMAL LOW (ref >59)
GFR calc non Af Amer: 30 mL/min/{1.73_m2} — ABNORMAL LOW (ref >59)
Glucose: 154 mg/dL — ABNORMAL HIGH (ref 65–99)
Potassium: 4.4 mmol/L (ref 3.5–5.2)
Sodium: 144 mmol/L (ref 134–144)

## 2020-01-09 LAB — PRO B NATRIURETIC PEPTIDE: NT-Pro BNP: 4436 pg/mL — ABNORMAL HIGH (ref 0–738)

## 2020-01-09 MED ORDER — POTASSIUM CHLORIDE ER 20 MEQ PO TBCR: 1.0000 | EXTENDED_RELEASE_TABLET | Freq: Every day | ORAL | 1 refills | Status: DC

## 2020-01-09 MED ORDER — TORSEMIDE 20 MG PO TABS: 40.0000 mg | ORAL_TABLET | Freq: Every day | ORAL | 1 refills | Status: DC

## 2020-01-09 NOTE — Patient Instructions (Addendum)
Medication Instructions:   TAKE TORSEMIDE 40 MG TWICE A DAY FOR 7 DAYS THEN BACK TO 40 MG ONCE A DAY   TAKE POTASSIUM 20 MEQ TWICE  A DAY FOR 7 DAYS THEN  BACK TO 20 MEQ ONCE A DAY   *If you need a refill on your cardiac medications before your next appointment, please call your pharmacy*   Lab Work:  BMET  BNP AND MAG TODAY   If you have labs (blood work) drawn today and your tests are completely normal, you will receive your results only by: Marland Kitchen MyChart Message (if you have MyChart) OR . A paper copy in the mail If you have any lab test that is abnormal or we need to change your treatment, we will call you to review the results.   Testing/Procedures: NONE ORDERED  TODAY   Follow-Up: At [facility], you and your health needs are our priority.  As part of our continuing mission to provide you with exceptional heart care, we have created designated Provider Care Teams.  These Care Teams include your primary Cardiologist (physician) and Advanced Practice Providers (APPs -  Physician Assistants and Nurse Practitioners) who all work together to provide you with the care you need, when you need it.  We recommend signing up for the patient portal called "MyChart".  Sign up information is provided on this After Visit Summary.  MyChart is used to connect with patients for Virtual Visits (Telemedicine).  Patients are able to view lab/test results, encounter notes, upcoming appointments, etc.  Non-urgent messages can be sent to your provider as well.   To learn more about what you can do with MyChart, go to [email or web address].    Your next appointment:   1 week(s) Tuesday OR Wednesday per Dr. Harriet Masson  The format for your next appointment:   In Person  Provider:   You will see  Dr. Berniece Salines.  Or, you can be scheduled with the following Advanced Practice Provider on your designated Care Team (at our [facility]):  Laurann Montana, FNP     Other Instructions

## 2020-01-09 NOTE — Progress Notes (Signed)
Cardiology Office Note:    Date:  01/09/2020   ID:  QUINCI GAVIDIA, DOB 1934/11/03, MRN [id number]  PCP:  Rochel Brome, MD  Cardiologist:  Virl Axe, MD  Electrophysiologist:  None   Referring MD: Rochel Brome, MD   Follow-up visit for bilateral leg edema  History of Present Illness:    Sara Hancock is a 85 y.o. female with a hx of atrial fibrillation on metoprolol succinate, and Coumadin, diastolic heart failure, hypertension presents today to be evaluated for worsening bilateral leg edema.  The patient tells me over the last week she has felt that her leg had gotten significantly elevated.  She is concerned about this.  In addition denies any chest pain.  She does have some shortness of breath but overall the leg edema is her main concern.  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 Jens Som   Pain in thoracic spine    Pure hypercholesterolemia    Type II or unspecified type diabetes mellitus without mention of complication, not stated as uncontrolled      Uterine fibroid 01/09/12    Past Surgical History:  Procedure Laterality Date   BREAST CYST EXCISION     left   CARDIOVERSION N/A 11/27/2017   Procedure: CARDIOVERSION;  Surgeon: Sanda Klein, MD;  Location: [place];  Service: Cardiovascular;  Laterality: N/A;   COLONOSCOPY     INSERTION OF MESH  05/18/2012   Procedure: INSERTION OF MESH;  Surgeon: Adin Hector, MD;  Location: [facility];  Service: General;  Laterality: N/A;   LIVER BIOPSY  05/18/2012   Procedure: LIVER BIOPSY;  Surgeon: Adin Hector, MD;  Location: [facility];  Service: General;;   POLYPECTOMY     THYROIDECTOMY, PARTIAL  1980   TUBAL LIGATION  1972   VENTRAL HERNIA REPAIR  05/18/2012   Procedure: LAPAROSCOPIC VENTRAL HERNIA;  Surgeon: Adin Hector, MD;  Location: [facility];  Service: General;  Laterality: N/A;  Laparoscopic Ventral Wall Hernia with Mesh    Current Medications: Current Meds  Medication Sig   ACCU-CHEK AVIVA PLUS test strip USE TO TEST TWICE DAILY   Accu-Chek FastClix Lancets MISC USE AS DIRECTED TO CHECK BLOOD SUGAR TWICE DAILY   calcium-vitamin D (OSCAL WITH D) 250-125 MG-UNIT per tablet Take 1 tablet by mouth daily.   Cholecalciferol (VITAMIN D3) 1000 units CAPS Take 1,000 Units by mouth daily.   Ferrous Sulfate (IRON) 325 (65 FE) MG TABS Take 325 mg by mouth daily.   fexofenadine (ALLEGRA)  180 MG tablet Take 180 mg by mouth daily.    fluticasone (FLONASE) 50 MCG/ACT nasal spray Place 1 spray into both nostrils as needed.    folic acid (FOLVITE) 852 MCG tablet Take 400 mcg by mouth daily.   hydrALAZINE (APRESOLINE) 100 MG tablet Take 1 tablet (100 mg total) by mouth QID.   insulin detemir (LEVEMIR FLEXPEN) 100 UNIT/ML injection Inject into the skin as directed. 18 units at bedtime and 32 units in the a.m.   irbesartan (AVAPRO) 300 MG tablet TAKE 1 TABLET BY MOUTH DAILY   JANUMET 50-1000 MG tablet TAKE 1 TABLET BY MOUTH TWICE DAILY WITH MEALS   ketoconazole (NIZORAL) 2 %  cream Apply 1 application topically daily as needed. Around skin folds for irritation   metoprolol tartrate (LOPRESSOR) 100 MG tablet Take 1 tablet (100 mg total) by mouth 2 (two) times daily.   Multiple Vitamin (MULTIVITAMIN WITH MINERALS) TABS Take 1 tablet by mouth daily.   pantoprazole (PROTONIX) 40 MG tablet TAKE 1 TABLET BY MOUTH EVERY DAY   Potassium Chloride ER 20 MEQ TBCR TAKE 1 TABLET BY MOUTH DAILY   simvastatin (ZOCOR) 40 MG tablet TAKE 1 TABLET BY MOUTH AT BEDTIME   tiZANidine (ZANAFLEX) 2 MG tablet Take 1 tablet (2 mg total) by mouth 3 (three) times daily.   torsemide (DEMADEX) 20 MG tablet Take 2 tablets (40 mg total) by mouth daily.   ursodiol (ACTIGALL) 300 MG capsule TAKE 1 CAPSULE(300 MG) BY MOUTH TWICE DAILY   vitamin B-12 (CYANOCOBALAMIN) 1000 MCG tablet Take 1,000 mcg by mouth daily.   vitamin C (ASCORBIC ACID) 500 MG tablet Take 500 mg by mouth daily.   vitamin E 400 UNIT capsule Take 400 Units by mouth daily.   warfarin (COUMADIN) 5 MG tablet TAKE AS DIRECTED BY COUMADIN CLINIC     Allergies:   Ace inhibitors, Capoten [captopril], and Neomycin-bacitracin zn-polymyx   Social History   Socioeconomic History   Marital status: Married    Spouse name: Not on file   Number of children: 2   Years of education: Not on file   Highest education level: Not on file  Occupational History   Occupation: retired    Fish farm manager: RETIRED  Tobacco Use   Smoking status: Never Smoker   Smokeless tobacco: Never Used  Scientific laboratory technician Use: Never used  Substance and Sexual Activity   Alcohol use: No   Drug use: No   Sexual activity: Not Currently  Other Topics Concern   Not on file  Social History Narrative   Not on file   Social Determinants of Health   Financial Resource Strain:    Difficulty of Paying Living Expenses: Not on file  Food Insecurity:    Worried About Charity fundraiser in the Last Year: Not on file   [place] in the  Last Year: Not on file  Transportation Needs:    Lack of Transportation (Medical): Not on file   Lack of Transportation (Non-Medical): Not on file  Physical Activity:    Days of Exercise per Week: Not on file   Minutes of Exercise per Session: Not on file  Stress:    Feeling of Stress : Not on file  Social Connections:    Frequency of Communication with Friends and Family: Not on file   Frequency of Social Gatherings with Friends and Family: Not on file   Attends Religious Services: Not on file   Active Member of Clubs or  Organizations: Not on file   Attends Archivist Meetings: Not on file   Marital Status: Not on file     Family History: The patient's family history includes Aneurysm in her sister; Arrhythmia in an other family member; Arthritis in an other family member; Congestive Heart Failure in her sister and another family member; Diabetes in her brother and sister; Heart attack in her father; Hyperlipidemia in an other family member; Hypertension in an other family member; Kidney failure in an other family member; Stroke in her mother and sister. There is no history of Colon cancer, Esophageal cancer, Stomach cancer, or Rectal cancer.  ROS:   Review of Systems  Constitution: Negative for decreased appetite, fever and weight gain.  HENT: Negative for congestion, ear discharge, hoarse voice and sore throat.   Eyes: Negative for discharge, redness, vision loss in right eye and visual halos.  Cardiovascular: Negative for chest pain, dyspnea on exertion, leg swelling, orthopnea and palpitations.  Respiratory: Negative for cough, hemoptysis, shortness of breath and snoring.   Endocrine: Negative for heat intolerance and polyphagia.  Hematologic/Lymphatic: Negative for bleeding problem. Does not bruise/bleed easily.  Skin: Negative for flushing, nail changes, rash and suspicious lesions.  Musculoskeletal: Negative for arthritis, joint pain, muscle cramps,  myalgias, neck pain and stiffness.  Gastrointestinal: Negative for abdominal pain, bowel incontinence, diarrhea and excessive appetite.  Genitourinary: Negative for decreased libido, genital sores and incomplete emptying.  Neurological: Negative for brief paralysis, focal weakness, headaches and loss of balance.  Psychiatric/Behavioral: Negative for altered mental status, depression and suicidal ideas.  Allergic/Immunologic: Negative for HIV exposure and persistent infections.    EKGs/Labs/Other Studies Reviewed:    The following studies were reviewed today:   EKG: None today  Echo IMPRESSIONS  1. Left ventricular ejection fraction, by estimation, is 60 to 65%. The left ventricle has normal function. The left ventricle has no regional wall motion abnormalities. There is mild left ventricular hypertrophy.  Left ventricular diastolic parameters are indeterminate.  2. Right ventricular systolic function is normal. The right ventricular size is normal. There is moderately elevated pulmonary artery systolic pressure.  3. Left atrial size was mildly dilated.  4. Right atrial size was mildly dilated.  5. The mitral valve is normal in structure. No evidence of mitral valve regurgitation. No evidence of mitral stenosis.  6. The aortic valve is normal in structure. Aortic valve regurgitation is not visualized. No aortic stenosis is present.  7. The inferior vena cava is normal in size with greater than 50% respiratory variability, suggesting right atrial pressure of 3 mmHg.  Recent Labs: 12/13/2019: Hemoglobin 9.9; Platelets 209 12/27/2019: ALT 16; BUN 36; Creatinine, Ser 1.58; NT-Pro BNP 4,066; Potassium 4.6; Sodium 145  Recent Lipid Panel    Component Value Date/Time   CHOL 110 12/09/2019 1111   TRIG 78 12/09/2019 1111   HDL 48 12/09/2019 1111   CHOLHDL 2.3 12/09/2019 1111   LDLCALC 46 12/09/2019 1111    Physical Exam:    VS:  BP 132/80    Pulse 89    Ht 5' 2.5" (1.588 m)    Wt  178 lb 3.2 oz (80.8 kg)    SpO2 96%    BMI 32.07 kg/m     Wt Readings from Last 3 Encounters:  01/09/20 178 lb 3.2 oz (80.8 kg)  12/27/19 177 lb 3.2 oz (80.4 kg)  12/23/19 181 lb 3.2 oz (82.2 kg)     GEN: Obese female, well nourished, well developed in no acute  distress HEENT: Normal NECK: No JVD; No carotid bruits LYMPHATICS: No lymphadenopathy CARDIAC: S1S2 noted,RRR, no murmurs, rubs, gallops RESPIRATORY:  Clear to auscultation without rales, wheezing or rhonchi  ABDOMEN: Soft, non-tender, non-distended, +bowel sounds, no guarding. EXTREMITIES: +2 bilateral pitting leg edema, No cyanosis, no clubbing MUSCULOSKELETAL:  No deformity  SKIN: Warm and dry NEUROLOGIC:  Alert and oriented x 3, non-focal PSYCHIATRIC:  Normal affect, good insight  ASSESSMENT:    1. Bilateral leg edema   2. Shortness of breath   3. Hypertension, essential   4. Persistent atrial fibrillation ([place])   5. Diabetes mellitus type 2 without retinopathy ([place])    PLAN:     Her clinical exam shows significant plus of bilateral leg edema.  I am going to increase her torsemide to 40 mg twice daily for 7 days.  Potassium will also be increased 20 mEq twice a day.  I am going to get blood work today to assess electrolytes.  As well as BNP.  I will see the patient next week hopefully at that time she would have responded to p.o. diuretics.    She is in atrial fibrillation today.  She tells me that she was discussed years ago that she will be on medicine to control heart rate.  She is stays on warfarin and follows with our Coumadin clinic.  Lifestyle modification for weight loss also advised to the visit.  I did speak to the patient about her echocardiogram result which was done last week.  No questions at this time.  The patient is in agreement with the above plan. The patient left the office in stable condition.  The patient will follow up in 1 week or sooner if needed.    Medication Adjustments/Labs and Tests  Ordered: Current medicines are reviewed at length with the patient today.  Concerns regarding medicines are outlined above.  No orders of the defined types were placed in this encounter.  No orders of the defined types were placed in this encounter.   Patient Instructions  Medication Instructions:   TAKE TORSEMIDE 40 MG TWICE A DAY FOR 7 DAYS THEN BACK TO 40 MG ONCE A DAY   TAKE POTASSIUM 20 MEQ TWICE  A DAY FOR 7 DAYS THEN    *If you need a refill on your cardiac medications before your next appointment, please call your pharmacy*   Lab Work:  BMET  BNP AND MAG TODAY   If you have labs (blood work) drawn today and your tests are completely normal, you will receive your results only by:  MyChart Message (if you have MyChart) OR  A paper copy in the mail If you have any lab test that is abnormal or we need to change your treatment, we will call you to review the results.   Testing/Procedures: NONE ORDERED  TODAY   Follow-Up: At [facility], you and your health needs are our priority.  As part of our continuing mission to provide you with exceptional heart care, we have created designated Provider Care Teams.  These Care Teams include your primary Cardiologist (physician) and Advanced Practice Providers (APPs -  Physician Assistants and Nurse Practitioners) who all work together to provide you with the care you need, when you need it.  We recommend signing up for the patient portal called "MyChart".  Sign up information is provided on this After Visit Summary.  MyChart is used to connect with patients for Virtual Visits (Telemedicine).  Patients are able to view lab/test results, encounter  notes, upcoming appointments, etc.  Non-urgent messages can be sent to your provider as well.   To learn more about what you can do with MyChart, go to [email or web address].    Your next appointment:   1 week(s) Tuesday OR Wednesday per Dr. Harriet Masson  The format for your next appointment:     In Person  Provider:   You will see  Dr. Berniece Salines.  Or, you can be scheduled with the following Advanced Practice Provider on your designated Care Team (at our [facility]):  Laurann Montana, FNP     Other Instructions      Adopting a Healthy Lifestyle.  Know what a healthy weight is for you (roughly BMI <25) and aim to maintain this   Aim for 7+ servings of fruits and vegetables daily   65-80+ fluid ounces of water or unsweet tea for healthy kidneys   Limit to max 1 drink of alcohol per day; avoid smoking/tobacco   Limit animal fats in diet for cholesterol and heart health - choose grass fed whenever available   Avoid highly processed foods, and foods high in saturated/trans fats   Aim for low stress - take time to unwind and care for your mental health   Aim for 150 min of moderate intensity exercise weekly for heart health, and weights twice weekly for bone health   Aim for 7-9 hours of sleep daily   When it comes to diets, agreement about the perfect plan isnt easy to find, even among the experts. Experts at the [employer] developed an idea known as the Healthy Eating Plate. Just imagine a plate divided into logical, healthy portions.   The emphasis is on diet quality:   Load up on vegetables and fruits - one-half of your plate: Aim for color and variety, and remember that potatoes dont count.   Go for whole grains - one-quarter of your plate: Whole wheat, barley, wheat berries, quinoa, oats, brown rice, and foods made with them. If you want pasta, go with whole wheat pasta.   Protein power - one-quarter of your plate: Fish, chicken, beans, and nuts are all healthy, versatile protein sources. Limit red meat.   The diet, however, does go beyond the plate, offering a few other suggestions.   Use healthy plant oils, such as olive, canola, soy, corn, sunflower and peanut. Check the labels, and avoid partially hydrogenated oil, which have  unhealthy trans fats.   If youre thirsty, drink water. Coffee and tea are good in moderation, but skip sugary drinks and limit milk and dairy products to one or two daily servings.   The type of carbohydrate in the diet is more important than the amount. Some sources of carbohydrates, such as vegetables, fruits, whole grains, and beans-are healthier than others.   Finally, stay active  Signed, Berniece Salines, DO  01/09/2020 10:35 AM    [place]

## 2020-01-10 ENCOUNTER — Ambulatory Visit (INDEPENDENT_AMBULATORY_CARE_PROVIDER_SITE_OTHER): Payer: Medicare Other

## 2020-01-10 ENCOUNTER — Telehealth: Payer: Self-pay

## 2020-01-10 DIAGNOSIS — Z5181 Encounter for therapeutic drug level monitoring: Secondary | ICD-10-CM

## 2020-01-10 DIAGNOSIS — I480 Paroxysmal atrial fibrillation: Secondary | ICD-10-CM

## 2020-01-10 LAB — POCT INR: INR: 2.3 (ref 2.0–3.0)

## 2020-01-10 NOTE — Patient Instructions (Signed)
Continue taking 1/2 tablet daily except 1 tablet each Tuesday and Saturday  Stay consistent with greens/salads Recheck in 6 weeks. Call with new medications or any procedures  [phone].

## 2020-01-10 NOTE — Telephone Encounter (Signed)
Left message on patients voicemail to please return our call.   

## 2020-01-10 NOTE — Telephone Encounter (Signed)
-----   Message from Berniece Salines, DO sent at 01/10/2020  7:59 AM EDT ----- Your creatinine function is stable.  Your proBNP is elevated but less than 3 weeks ago.  I will refer to see you next week

## 2020-01-10 NOTE — Telephone Encounter (Signed)
Follow Up:      Returning your call from today. 

## 2020-01-10 NOTE — Telephone Encounter (Signed)
    Pt is returning call from [facility]

## 2020-01-11 NOTE — Telephone Encounter (Signed)
Spoke with patient regarding results and recommendation.  Patient verbalizes understanding and is agreeable to plan of care. Advised patient to call back with any issues or concerns.  

## 2020-01-17 ENCOUNTER — Other Ambulatory Visit: Payer: Self-pay

## 2020-01-17 ENCOUNTER — Ambulatory Visit (INDEPENDENT_AMBULATORY_CARE_PROVIDER_SITE_OTHER): Payer: Medicare Other | Admitting: Cardiology

## 2020-01-17 ENCOUNTER — Other Ambulatory Visit: Payer: Self-pay | Admitting: Cardiology

## 2020-01-17 ENCOUNTER — Encounter: Payer: Self-pay | Admitting: Cardiology

## 2020-01-17 VITALS — BP 120/80 | HR 90 | Ht 62.45 in | Wt 171.0 lb

## 2020-01-17 DIAGNOSIS — E119 Type 2 diabetes mellitus without complications: Secondary | ICD-10-CM | POA: Diagnosis not present

## 2020-01-17 DIAGNOSIS — E669 Obesity, unspecified: Secondary | ICD-10-CM | POA: Diagnosis not present

## 2020-01-17 DIAGNOSIS — I4819 Other persistent atrial fibrillation: Secondary | ICD-10-CM | POA: Diagnosis not present

## 2020-01-17 DIAGNOSIS — I10 Essential (primary) hypertension: Secondary | ICD-10-CM | POA: Diagnosis not present

## 2020-01-17 DIAGNOSIS — E785 Hyperlipidemia, unspecified: Secondary | ICD-10-CM

## 2020-01-17 MED ORDER — METOLAZONE 5 MG PO TABS: ORAL_TABLET | ORAL | 0 refills | Status: DC

## 2020-01-17 MED ORDER — TORSEMIDE 20 MG PO TABS: 40.0000 mg | ORAL_TABLET | Freq: Two times a day (BID) | ORAL | 3 refills | Status: DC

## 2020-01-17 NOTE — Progress Notes (Signed)
Cardiology Office Note:    Date:  01/17/2020   ID:  [name], DOB [birth year], MRN [id number]  PCP:  Rochel Brome, MD  Cardiologist:  Virl Axe, MD  Electrophysiologist:  None   Referring MD: Rochel Brome, MD   Chief Complaint  Patient presents with  . Follow-up   History of Present Illness:    Sara Hancock is a 85 y.o. female with a hx of atrial fibrillation on metoprolol and Coumadin, diastolic heart failure, and hypertension.  Did see the patient on January 09, 2020 at that time she experiencing significant shortness of breath and bilateral leg edema.  At that visit I increase her torsemide to 40 mg twice a day for 7 days.  She is here for follow-up visit.  The patient did tell me she has some relief from her twice a day 40 mg torsemide.  But when she went back on her regular dosage the fluid started to build up again.  Her shortness of breath has gotten a lot better.  And her leg edema is improved.  Past Medical History:  Diagnosis Date  . Anemia   . Arthritis   . Biliary cirrhosis ([place])   . Cholelithiasis 01/09/12  . Chronic kidney disease, stage 3   . Diverticulosis of colon with hemorrhage 06/08/2000  . Esophageal stenosis 01/08/11  . Esophagitis 01/08/11  . GERD (gastroesophageal reflux disease)   . Hiatal hernia 01/08/11  . HTN (hypertension)   . Hx of adenomatous colonic polyps 01/08/11  . Hyperplastic colon polyp 06/08/2000, 07/31/2003, 01/08/11  . 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 Localized edema   . Long term (current) use of insulin ([place])   . Mixed hyperlipidemia   . Obesity   . Osteopenia   . Other iron deficiency anemias   . Other specified menopausal and perimenopausal disorders   . PAF (paroxysmal atrial fibrillation) ([facility])    occurring in the setting of bradycardia and mild first degree AV block followed by Dr. Jens Som    . Pain in thoracic spine   . Pure hypercholesterolemia   . Type II or unspecified type diabetes mellitus without mention of complication, not stated as uncontrolled   . Uterine fibroid 01/09/12    Past Surgical History:  Procedure Laterality Date  . BREAST CYST EXCISION     left  . CARDIOVERSION N/A 11/27/2017   Procedure: CARDIOVERSION;  Surgeon: Sanda Klein, MD;  Location: [facility];  Service: Cardiovascular;  Laterality: N/A;  . COLONOSCOPY    . INSERTION OF MESH  05/18/2012   Procedure: INSERTION OF MESH;  Surgeon: Adin Hector, MD;  Location: [facility];  Service: General;  Laterality: N/A;  . LIVER BIOPSY  05/18/2012   Procedure: LIVER BIOPSY;  Surgeon: Adin Hector, MD;  Location: [facility];  Service: General;;  . POLYPECTOMY    . THYROIDECTOMY, PARTIAL  1980  . TUBAL LIGATION  1972  . VENTRAL HERNIA REPAIR  05/18/2012   Procedure: LAPAROSCOPIC VENTRAL HERNIA;  Surgeon: Adin Hector, MD;  Location: [facility];  Service: General;  Laterality: N/A;  Laparoscopic Ventral Wall Hernia with Mesh    Current Medications: Current Meds  Medication Sig  . ACCU-CHEK AVIVA PLUS test strip USE TO TEST TWICE DAILY  . Accu-Chek FastClix Lancets MISC USE AS DIRECTED TO CHECK BLOOD SUGAR TWICE DAILY  . calcium-vitamin D (OSCAL WITH D) 250-125  MG-UNIT per tablet Take 1 tablet by mouth daily.  . Cholecalciferol (VITAMIN D3) 1000 units CAPS Take 1,000 Units by mouth daily.  . Ferrous Sulfate (IRON) 325 (65 FE) MG TABS Take 325 mg by mouth daily.  . fexofenadine (ALLEGRA) 180 MG tablet Take 180 mg by mouth daily.   . fluticasone (FLONASE) 50 MCG/ACT nasal spray Place 1 spray into both nostrils as needed.   . folic acid (FOLVITE) 093 MCG tablet Take 400 mcg by mouth daily.  . hydrALAZINE (APRESOLINE) 100 MG tablet Take 1 tablet (100 mg total) by mouth QID.  Marland Kitchen insulin detemir (LEVEMIR FLEXPEN) 100 UNIT/ML injection Inject into the skin as directed. 18 units at bedtime and 32 units in the a.m.  .  irbesartan (AVAPRO) 300 MG tablet TAKE 1 TABLET BY MOUTH DAILY  . JANUMET 50-1000 MG tablet TAKE 1 TABLET BY MOUTH TWICE DAILY WITH MEALS  . ketoconazole (NIZORAL) 2 % cream Apply 1 application topically daily as needed. Around skin folds for irritation  . metoprolol tartrate (LOPRESSOR) 100 MG tablet Take 1 tablet (100 mg total) by mouth 2 (two) times daily.  . Multiple Vitamin (MULTIVITAMIN WITH MINERALS) TABS Take 1 tablet by mouth daily.  . pantoprazole (PROTONIX) 40 MG tablet TAKE 1 TABLET BY MOUTH EVERY DAY  . Potassium Chloride ER 20 MEQ TBCR Take 1 tablet by mouth daily.  . simvastatin (ZOCOR) 40 MG tablet TAKE 1 TABLET BY MOUTH AT BEDTIME  . tiZANidine (ZANAFLEX) 2 MG tablet Take 1 tablet (2 mg total) by mouth 3 (three) times daily.  . ursodiol (ACTIGALL) 300 MG capsule TAKE 1 CAPSULE(300 MG) BY MOUTH TWICE DAILY  . vitamin B-12 (CYANOCOBALAMIN) 1000 MCG tablet Take 1,000 mcg by mouth daily.  . vitamin C (ASCORBIC ACID) 500 MG tablet Take 500 mg by mouth daily.  . vitamin E 400 UNIT capsule Take 400 Units by mouth daily.  Marland Kitchen warfarin (COUMADIN) 5 MG tablet TAKE AS DIRECTED BY COUMADIN CLINIC  . [DISCONTINUED] torsemide (DEMADEX) 20 MG tablet Take 2 tablets (40 mg total) by mouth daily.     Allergies:   Ace inhibitors, Capoten [captopril], and Neomycin-bacitracin zn-polymyx   Social History   Socioeconomic History  . Marital status: Married    Spouse name: Not on file  . Number of children: 2  . Years of education: Not on file  . Highest education level: Not on file  Occupational History  . Occupation: retired    Fish farm manager: RETIRED  Tobacco Use  . Smoking status: Never Smoker  . Smokeless tobacco: Never Used  Vaping Use  . Vaping Use: Never used  Substance and Sexual Activity  . Alcohol use: No  . Drug use: No  . Sexual activity: Not Currently  Other Topics Concern  . Not on file  Social History Narrative  . Not on file   Social Determinants of Health   Financial  Resource Strain:   . Difficulty of Paying Living Expenses: Not on file  Food Insecurity:   . Worried About Charity fundraiser in the Last Year: Not on file  . Ran Out of Food in the Last Year: Not on file  Transportation Needs:   . Lack of Transportation (Medical): Not on file  . Lack of Transportation (Non-Medical): Not on file  Physical Activity:   . Days of Exercise per Week: Not on file  . Minutes of Exercise per Session: Not on file  Stress:   . Feeling of Stress : Not on file  Social Connections:   . Frequency of Communication with Friends and Family: Not on file  . Frequency of Social Gatherings with Friends and Family: Not on file  . Attends Religious Services: Not on file  . Active Member of Clubs or Organizations: Not on file  . Attends Archivist Meetings: Not on file  . Marital Status: Not on file     Family History: The patient's family history includes Aneurysm in her sister; Arrhythmia in an other family member; Arthritis in an other family member; Congestive Heart Failure in her sister and another family member; Diabetes in her brother and sister; Heart attack in her father; Hyperlipidemia in an other family member; Hypertension in an other family member; Kidney failure in an other family member; Stroke in her mother and sister. There is no history of Colon cancer, Esophageal cancer, Stomach cancer, or Rectal cancer.  ROS:   Review of Systems  Constitution: Negative for decreased appetite, fever and weight gain.  HENT: Negative for congestion, ear discharge, hoarse voice and sore throat.   Eyes: Negative for discharge, redness, vision loss in right eye and visual halos.  Cardiovascular: Negative for chest pain, dyspnea on exertion, leg swelling, orthopnea and palpitations.  Respiratory: Negative for cough, hemoptysis, shortness of breath and snoring.   Endocrine: Negative for heat intolerance and polyphagia.  Hematologic/Lymphatic: Negative for bleeding  problem. Does not bruise/bleed easily.  Skin: Negative for flushing, nail changes, rash and suspicious lesions.  Musculoskeletal: Negative for arthritis, joint pain, muscle cramps, myalgias, neck pain and stiffness.  Gastrointestinal: Negative for abdominal pain, bowel incontinence, diarrhea and excessive appetite.  Genitourinary: Negative for decreased libido, genital sores and incomplete emptying.  Neurological: Negative for brief paralysis, focal weakness, headaches and loss of balance.  Psychiatric/Behavioral: Negative for altered mental status, depression and suicidal ideas.  Allergic/Immunologic: Negative for HIV exposure and persistent infections.    EKGs/Labs/Other Studies Reviewed:    The following studies were reviewed today:   EKG: None today  Echo IMPRESSIONS 01/09/2020. 1. Left ventricular ejection fraction, by estimation, is 60 to 65%. The left ventricle has normal function. The left ventricle has no regional wall motion abnormalities. There is mild left ventricular hypertrophy.  Left ventricular diastolic parameters are indeterminate.  2. Right ventricular systolic function is normal. The right ventricular size is normal. There is moderately elevated pulmonary artery systolic pressure.  3. Left atrial size was mildly dilated.  4. Right atrial size was mildly dilated.  5. The mitral valve is normal in structure. No evidence of mitral valve regurgitation. No evidence of mitral stenosis.  6. The aortic valve is normal in structure. Aortic valve regurgitation is not visualized. No aortic stenosis is present.  7. The inferior vena cava is normal in size with greater than 50% respiratory variability, suggesting right atrial pressure of 3 mmHg.  Recent Labs: 12/13/2019: Hemoglobin 9.9; Platelets 209 12/27/2019: ALT 16 01/09/2020: BUN 42; Creatinine, Ser 1.57; NT-Pro BNP 4,436; Potassium 4.4; Sodium 144  Recent Lipid Panel    Component Value Date/Time   CHOL 110 12/09/2019  1111   TRIG 78 12/09/2019 1111   HDL 48 12/09/2019 1111   CHOLHDL 2.3 12/09/2019 1111   LDLCALC 46 12/09/2019 1111    Physical Exam:    VS:  BP 120/80 (BP Location: Left Arm, Patient Position: Sitting, Cuff Size: Normal)   Pulse 90   Ht 5' 2.45" (1.586 m)   Wt 171 lb (77.6 kg)   SpO2 97%   BMI 30.83 kg/m  Wt Readings from Last 3 Encounters:  01/17/20 171 lb (77.6 kg)  01/09/20 178 lb 3.2 oz (80.8 kg)  12/27/19 177 lb 3.2 oz (80.4 kg)     GEN: Well nourished, well developed in no acute distress HEENT: Normal NECK: No JVD; No carotid bruits LYMPHATICS: No lymphadenopathy CARDIAC: S1S2 noted,RRR, no murmurs, rubs, gallops RESPIRATORY:  Clear to auscultation without rales, wheezing or rhonchi  ABDOMEN: Soft, non-tender, non-distended, +bowel sounds, no guarding. EXTREMITIES: +1 bilateral leg edema, No cyanosis, no clubbing MUSCULOSKELETAL:  No deformity  SKIN: Warm and dry NEUROLOGIC:  Alert and oriented x 3, non-focal PSYCHIATRIC:  Normal affect, good insight  ASSESSMENT:    1. Persistent atrial fibrillation ([place])   2. Hypertension, essential   3. Diabetes mellitus type 2 without retinopathy ([place])   4. Dyslipidemia   5. Obesity (BMI 30-39.9)    PLAN:    She has had some improvement with increasing her torsemide dosage.  When I keep her on 40 mg twice a day.  For now I am also going to have the patient take metolazone 5 mg on Wednesday, Friday and Sunday 30 minutes prior to her torsemide dosage to help with effective diuresis.  BMP, mag will be done today.   Continue rate control as well as anticoagulation for atrial fibrillation.  Diabetes insulin per PCP.  The patient understands the need to lose weight with diet and exercise. We have discussed specific strategies for this.  The patient is in agreement with the above plan. The patient left the office in stable condition.  The patient will follow up in 4 weeks or sooner if needed.   Medication  Adjustments/Labs and Tests Ordered: Current medicines are reviewed at length with the patient today.  Concerns regarding medicines are outlined above.  No orders of the defined types were placed in this encounter.  No orders of the defined types were placed in this encounter.   There are no Patient Instructions on file for this visit.   Adopting a Healthy Lifestyle.  Know what a healthy weight is for you (roughly BMI <25) and aim to maintain this   Aim for 7+ servings of fruits and vegetables daily   65-80+ fluid ounces of water or unsweet tea for healthy kidneys   Limit to max 1 drink of alcohol per day; avoid smoking/tobacco   Limit animal fats in diet for cholesterol and heart health - choose grass fed whenever available   Avoid highly processed foods, and foods high in saturated/trans fats   Aim for low stress - take time to unwind and care for your mental health   Aim for 150 min of moderate intensity exercise weekly for heart health, and weights twice weekly for bone health   Aim for 7-9 hours of sleep daily   When it comes to diets, agreement about the perfect plan isnt easy to find, even among the experts. Experts at the [employer] developed an idea known as the Healthy Eating Plate. Just imagine a plate divided into logical, healthy portions.   The emphasis is on diet quality:   Load up on vegetables and fruits - one-half of your plate: Aim for color and variety, and remember that potatoes dont count.   Go for whole grains - one-quarter of your plate: Whole wheat, barley, wheat berries, quinoa, oats, brown rice, and foods made with them. If you want pasta, go with whole wheat pasta.   Protein power - one-quarter of your plate: Fish,  chicken, beans, and nuts are all healthy, versatile protein sources. Limit red meat.   The diet, however, does go beyond the plate, offering a few other suggestions.   Use healthy plant oils, such as olive, canola,  soy, corn, sunflower and peanut. Check the labels, and avoid partially hydrogenated oil, which have unhealthy trans fats.   If youre thirsty, drink water. Coffee and tea are good in moderation, but skip sugary drinks and limit milk and dairy products to one or two daily servings.   The type of carbohydrate in the diet is more important than the amount. Some sources of carbohydrates, such as vegetables, fruits, whole grains, and beans-are healthier than others.   Finally, stay active  Signed, Berniece Salines, DO  01/17/2020 3:32 PM    [facility]

## 2020-01-17 NOTE — Patient Instructions (Signed)
Medication Instructions:  Your physician has recommended you make the following change in your medication:  START: Metolazone 5 mg take one tablet by mouth daily on Wednesday, Friday, and Sunday. Please take this medication 30 minutes prior to taking your torsemide.  INCREASE: Torsemide 40 mg take two tablets by mouth twice daily.  *If you need a refill on your cardiac medications before your next appointment, please call your pharmacy*   Lab Work: Your physician recommends that you return for lab work in: [place], [place] If you have labs (blood work) drawn today and your tests are completely normal, you will receive your results only by:  Man (if you have MyChart) OR  A paper copy in the mail If you have any lab test that is abnormal or we need to change your treatment, we will call you to review the results.   Testing/Procedures: None   Follow-Up: At [facility], you and your health needs are our priority.  As part of our continuing mission to provide you with exceptional heart care, we have created designated Provider Care Teams.  These Care Teams include your primary Cardiologist (physician) and Advanced Practice Providers (APPs -  Physician Assistants and Nurse Practitioners) who all work together to provide you with the care you need, when you need it.  We recommend signing up for the patient portal called "MyChart".  Sign up information is provided on this After Visit Summary.  MyChart is used to connect with patients for Virtual Visits (Telemedicine).  Patients are able to view lab/test results, encounter notes, upcoming appointments, etc.  Non-urgent messages can be sent to your provider as well.   To learn more about what you can do with MyChart, go to [email or web address].    Your next appointment:   4 week(s)  The format for your next appointment:   In Person  Provider:   Berniece Salines, MD   Other Instructions

## 2020-01-18 ENCOUNTER — Telehealth: Payer: Self-pay | Admitting: Cardiology

## 2020-01-18 LAB — BASIC METABOLIC PANEL
BUN/Creatinine Ratio: 27 (ref 12–28)
BUN: 46 mg/dL — ABNORMAL HIGH (ref 8–27)
CO2: 27 mmol/L (ref 20–29)
Calcium: 9.7 mg/dL (ref 8.7–10.3)
Chloride: 98 mmol/L (ref 96–106)
Creatinine, Ser: 1.69 mg/dL — ABNORMAL HIGH (ref 0.57–1.00)
GFR calc Af Amer: 32 mL/min/{1.73_m2} — ABNORMAL LOW (ref >59)
GFR calc non Af Amer: 28 mL/min/{1.73_m2} — ABNORMAL LOW (ref >59)
Glucose: 129 mg/dL — ABNORMAL HIGH (ref 65–99)
Potassium: 4.4 mmol/L (ref 3.5–5.2)
Sodium: 142 mmol/L (ref 134–144)

## 2020-01-18 LAB — MAGNESIUM: Magnesium: 1.7 mg/dL (ref 1.6–2.3)

## 2020-01-18 NOTE — Telephone Encounter (Signed)
Transferred call to [place] 

## 2020-02-04 ENCOUNTER — Other Ambulatory Visit: Payer: Self-pay | Admitting: Family Medicine

## 2020-02-06 ENCOUNTER — Telehealth: Payer: Self-pay | Admitting: Gastroenterology

## 2020-02-09 ENCOUNTER — Ambulatory Visit (INDEPENDENT_AMBULATORY_CARE_PROVIDER_SITE_OTHER): Payer: Medicare Other

## 2020-02-09 ENCOUNTER — Other Ambulatory Visit: Payer: Self-pay | Admitting: Family Medicine

## 2020-02-09 ENCOUNTER — Other Ambulatory Visit: Payer: Self-pay

## 2020-02-09 DIAGNOSIS — Z23 Encounter for immunization: Secondary | ICD-10-CM | POA: Diagnosis not present

## 2020-02-09 DIAGNOSIS — Z1231 Encounter for screening mammogram for malignant neoplasm of breast: Secondary | ICD-10-CM

## 2020-02-09 NOTE — Progress Notes (Signed)
   Covid-19 Vaccination Clinic  Name:  MERANDA DECHAINE    MRN: [id number] DOB: Oct 18, 1934  02/09/2020  Ms. Kanitz was observed post Covid-19 immunization for 15 minutes without incident. She was provided with Vaccine Information Sheet and instruction to access the V-Safe system.   Ms. Grange was instructed to call 911 with any severe reactions post vaccine: Marland Kitchen Difficulty breathing  . Swelling of face and throat  . A fast heartbeat  . A bad rash all over body  . Dizziness and weakness

## 2020-02-14 ENCOUNTER — Ambulatory Visit: Payer: Medicare Other | Admitting: Cardiology

## 2020-02-14 DIAGNOSIS — L570 Actinic keratosis: Secondary | ICD-10-CM | POA: Diagnosis not present

## 2020-02-17 ENCOUNTER — Other Ambulatory Visit: Payer: Self-pay

## 2020-02-17 ENCOUNTER — Ambulatory Visit (INDEPENDENT_AMBULATORY_CARE_PROVIDER_SITE_OTHER): Payer: Medicare Other | Admitting: Cardiology

## 2020-02-17 ENCOUNTER — Encounter: Payer: Self-pay | Admitting: Cardiology

## 2020-02-17 VITALS — BP 108/80 | HR 88 | Ht 62.45 in | Wt 171.0 lb

## 2020-02-17 DIAGNOSIS — R600 Localized edema: Secondary | ICD-10-CM

## 2020-02-17 DIAGNOSIS — I10 Essential (primary) hypertension: Secondary | ICD-10-CM | POA: Diagnosis not present

## 2020-02-17 DIAGNOSIS — I4819 Other persistent atrial fibrillation: Secondary | ICD-10-CM

## 2020-02-17 NOTE — Patient Instructions (Signed)
Medication Instructions:  Your physician recommends that you continue on your current medications as directed. Please refer to the Current Medication list given to you today.  *If you need a refill on your cardiac medications before your next appointment, please call your pharmacy*   Lab Work: Your physician recommends that you return for lab work Bmet and Magnesium  If you have labs (blood work) drawn today and your tests are completely normal, you will receive your results only by: Marland Kitchen MyChart Message (if you have MyChart) OR . A paper copy in the mail If you have any lab test that is abnormal or we need to change your treatment, we will call you to review the results.   Testing/Procedures: NONE   Follow-Up: At [facility], you and your health needs are our priority.  As part of our continuing mission to provide you with exceptional heart care, we have created designated Provider Care Teams.  These Care Teams include your primary Cardiologist (physician) and Advanced Practice Providers (APPs -  Physician Assistants and Nurse Practitioners) who all work together to provide you with the care you need, when you need it.  We recommend signing up for the patient portal called "MyChart".  Sign up information is provided on this After Visit Summary.  MyChart is used to connect with patients for Virtual Visits (Telemedicine).  Patients are able to view lab/test results, encounter notes, upcoming appointments, etc.  Non-urgent messages can be sent to your provider as well.   To learn more about what you can do with MyChart, go to [email or web address].    Your next appointment:   1 month(s)  The format for your next appointment:   In Person  Provider:   Berniece Salines, DO   Other Instructions NONE

## 2020-02-17 NOTE — Progress Notes (Signed)
Cardiology Office Note:    Date:  02/17/2020   ID:  Sara Hancock, DOB July 26, 1934, MRN [id number]  PCP:  Rochel Brome, MD  Cardiologist:  Virl Axe, MD  Electrophysiologist:  None   Referring MD: Rochel Brome, MD   " I am doing well"  History of Present Illness:    Sara Hancock is a 85 y.o. female with a hx of atrial fibrillation on metoprolol and Coumadin, diastolic heart failure, and hypertension.  I did see the patient on January 11, 2020 at that time I made her torsemide 40 mg twice a day gave her some metolazone for effective diuresis she is here today for follow-up visit.  Today she tells me that she has been doing well from a cardiovascular standpoint.  She has lost some weight since she has had her diuretics optimized.   Past Medical History:  Diagnosis Date  . Anemia   . Arthritis   . Biliary cirrhosis ([place])   . Cholelithiasis 01/09/12  . Chronic kidney disease, stage 3 ([place])   . Diverticulosis of colon with hemorrhage 06/08/2000  . Esophageal stenosis 01/08/11  . Esophagitis 01/08/11  . GERD (gastroesophageal reflux disease)   . Hiatal hernia 01/08/11  . HTN (hypertension)   . Hx of adenomatous colonic polyps 01/08/11  . Hyperplastic colon polyp 06/08/2000, 07/31/2003, 01/08/11  . 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 Localized edema   . Long term (current) use of insulin ([place])   . Mixed hyperlipidemia   . Obesity   . Osteopenia   . Other iron deficiency anemias   . Other specified menopausal and perimenopausal disorders   . PAF (paroxysmal atrial fibrillation) ([facility])    occurring in the setting of bradycardia and mild first degree AV block followed by Dr. Jens Som  . Pain in thoracic spine   . Pure hypercholesterolemia   . Type II or unspecified type diabetes mellitus without mention of complication, not stated as uncontrolled   .  Uterine fibroid 01/09/12    Past Surgical History:  Procedure Laterality Date  . BREAST CYST EXCISION     left  . CARDIOVERSION N/A 11/27/2017   Procedure: CARDIOVERSION;  Surgeon: Sanda Klein, MD;  Location: [facility];  Service: Cardiovascular;  Laterality: N/A;  . COLONOSCOPY    . INSERTION OF MESH  05/18/2012   Procedure: INSERTION OF MESH;  Surgeon: Adin Hector, MD;  Location: [facility];  Service: General;  Laterality: N/A;  . LIVER BIOPSY  05/18/2012   Procedure: LIVER BIOPSY;  Surgeon: Adin Hector, MD;  Location: [facility];  Service: General;;  . POLYPECTOMY    . THYROIDECTOMY, PARTIAL  1980  . TUBAL LIGATION  1972  . VENTRAL HERNIA REPAIR  05/18/2012   Procedure: LAPAROSCOPIC VENTRAL HERNIA;  Surgeon: Adin Hector, MD;  Location: [facility];  Service: General;  Laterality: N/A;  Laparoscopic Ventral Wall Hernia with Mesh    Current Medications: Current Meds  Medication Sig  . ACCU-CHEK AVIVA PLUS test strip USE TO TEST TWICE DAILY  . Accu-Chek FastClix Lancets MISC USE AS DIRECTED TO CHECK BLOOD SUGAR TWICE DAILY  . calcium-vitamin D (OSCAL WITH D) 250-125 MG-UNIT per tablet Take 1 tablet by mouth daily.  . Cholecalciferol (VITAMIN D3) 1000 units CAPS Take 1,000 Units by mouth daily.  . Ferrous Sulfate (IRON) 325 (65 FE) MG TABS Take 325 mg by mouth  daily.  . fexofenadine (ALLEGRA) 180 MG tablet Take 180 mg by mouth daily.   . fluticasone (FLONASE) 50 MCG/ACT nasal spray Place 1 spray into both nostrils as needed.   . folic acid (FOLVITE) 585 MCG tablet Take 400 mcg by mouth daily.  . hydrALAZINE (APRESOLINE) 100 MG tablet Take 1 tablet (100 mg total) by mouth QID.  Marland Kitchen insulin detemir (LEVEMIR FLEXPEN) 100 UNIT/ML injection Inject into the skin as directed. 18 units at bedtime and 32 units in the a.m.  . irbesartan (AVAPRO) 300 MG tablet TAKE 1 TABLET BY MOUTH DAILY  . JANUMET 50-1000 MG tablet TAKE 1 TABLET BY MOUTH TWICE DAILY WITH MEALS  . ketoconazole (NIZORAL) 2 % cream  Apply 1 application topically daily as needed. Around skin folds for irritation  . metoprolol tartrate (LOPRESSOR) 100 MG tablet Take 1 tablet (100 mg total) by mouth 2 (two) times daily.  . Multiple Vitamin (MULTIVITAMIN WITH MINERALS) TABS Take 1 tablet by mouth daily.  . pantoprazole (PROTONIX) 40 MG tablet TAKE 1 TABLET BY MOUTH EVERY DAY  . Potassium Chloride ER 20 MEQ TBCR Take 1 tablet by mouth daily.  . simvastatin (ZOCOR) 40 MG tablet TAKE 1 TABLET BY MOUTH AT BEDTIME  . tiZANidine (ZANAFLEX) 2 MG tablet Take 1 tablet (2 mg total) by mouth 3 (three) times daily.  Marland Kitchen torsemide (DEMADEX) 20 MG tablet Take 2 tablets (40 mg total) by mouth 2 (two) times daily.  . ursodiol (ACTIGALL) 300 MG capsule TAKE 1 CAPSULE(300 MG) BY MOUTH TWICE DAILY  . vitamin B-12 (CYANOCOBALAMIN) 1000 MCG tablet Take 1,000 mcg by mouth daily.  . vitamin C (ASCORBIC ACID) 500 MG tablet Take 500 mg by mouth daily.  . vitamin E 400 UNIT capsule Take 400 Units by mouth daily.  Marland Kitchen warfarin (COUMADIN) 5 MG tablet TAKE AS DIRECTED BY COUMADIN CLINIC     Allergies:   Ace inhibitors, Capoten [captopril], and Neomycin-bacitracin zn-polymyx   Social History   Socioeconomic History  . Marital status: Married    Spouse name: Not on file  . Number of children: 2  . Years of education: Not on file  . Highest education level: Not on file  Occupational History  . Occupation: retired    Fish farm manager: RETIRED  Tobacco Use  . Smoking status: Never Smoker  . Smokeless tobacco: Never Used  Vaping Use  . Vaping Use: Never used  Substance and Sexual Activity  . Alcohol use: No  . Drug use: No  . Sexual activity: Not Currently  Other Topics Concern  . Not on file  Social History Narrative  . Not on file   Social Determinants of Health   Financial Resource Strain:   . Difficulty of Paying Living Expenses: Not on file  Food Insecurity:   . Worried About Charity fundraiser in the Last Year: Not on file  . Ran Out of  Food in the Last Year: Not on file  Transportation Needs:   . Lack of Transportation (Medical): Not on file  . Lack of Transportation (Non-Medical): Not on file  Physical Activity:   . Days of Exercise per Week: Not on file  . Minutes of Exercise per Session: Not on file  Stress:   . Feeling of Stress : Not on file  Social Connections:   . Frequency of Communication with Friends and Family: Not on file  . Frequency of Social Gatherings with Friends and Family: Not on file  . Attends Religious Services: Not on  file  . Active Member of Clubs or Organizations: Not on file  . Attends Archivist Meetings: Not on file  . Marital Status: Not on file     Family History: The patient's family history includes Aneurysm in her sister; Arrhythmia in an other family member; Arthritis in an other family member; Congestive Heart Failure in her sister and another family member; Diabetes in her brother and sister; Heart attack in her father; Hyperlipidemia in an other family member; Hypertension in an other family member; Kidney failure in an other family member; Stroke in her mother and sister. There is no history of Colon cancer, Esophageal cancer, Stomach cancer, or Rectal cancer.  ROS:   Review of Systems  Constitution: Negative for decreased appetite, fever and weight gain.  HENT: Negative for congestion, ear discharge, hoarse voice and sore throat.   Eyes: Negative for discharge, redness, vision loss in right eye and visual halos.  Cardiovascular: Negative for chest pain, dyspnea on exertion, leg swelling, orthopnea and palpitations.  Respiratory: Negative for cough, hemoptysis, shortness of breath and snoring.   Endocrine: Negative for heat intolerance and polyphagia.  Hematologic/Lymphatic: Negative for bleeding problem. Does not bruise/bleed easily.  Skin: Negative for flushing, nail changes, rash and suspicious lesions.  Musculoskeletal: Negative for arthritis, joint pain, muscle  cramps, myalgias, neck pain and stiffness.  Gastrointestinal: Negative for abdominal pain, bowel incontinence, diarrhea and excessive appetite.  Genitourinary: Negative for decreased libido, genital sores and incomplete emptying.  Neurological: Negative for brief paralysis, focal weakness, headaches and loss of balance.  Psychiatric/Behavioral: Negative for altered mental status, depression and suicidal ideas.  Allergic/Immunologic: Negative for HIV exposure and persistent infections.    EKGs/Labs/Other Studies Reviewed:    The following studies were reviewed today:   EKG:  The ekg ordered today demonstrates   EchoIMPRESSIONS 01/09/2020. 1. Left ventricular ejection fraction, by estimation, is 60 to 65%. The left ventricle has normal function. The left ventricle has no regional wall motion abnormalities. There is mild left ventricular hypertrophy.  Left ventricular diastolic parameters are indeterminate.  2. Right ventricular systolic function is normal. The right ventricular size is normal. There is moderately elevated pulmonary artery systolic pressure.  3. Left atrial size was mildly dilated.  4. Right atrial size was mildly dilated.  5. The mitral valve is normal in structure. No evidence of mitral valve regurgitation. No evidence of mitral stenosis.  6. The aortic valve is normal in structure. Aortic valve regurgitation is not visualized. No aortic stenosis is present.  7. The inferior vena cava is normal in size with greater than 50% respiratory variability, suggesting right atrial pressure of 3 mmHg.  Recent Labs: 12/13/2019: Hemoglobin 9.9; Platelets 209 12/27/2019: ALT 16 01/09/2020: NT-Pro BNP 4,436 01/17/2020: BUN 46; Creatinine, Ser 1.69; Magnesium 1.7; Potassium 4.4; Sodium 142  Recent Lipid Panel    Component Value Date/Time   CHOL 110 12/09/2019 1111   TRIG 78 12/09/2019 1111   HDL 48 12/09/2019 1111   CHOLHDL 2.3 12/09/2019 1111   LDLCALC 46 12/09/2019 1111     Physical Exam:    VS:  BP 108/80 (BP Location: Left Arm, Patient Position: Sitting, Cuff Size: Normal)   Pulse 88   Ht 5' 2.45" (1.586 m)   Wt 171 lb (77.6 kg)   SpO2 96%   BMI 30.83 kg/m     Wt Readings from Last 3 Encounters:  02/17/20 171 lb (77.6 kg)  01/17/20 171 lb (77.6 kg)  01/09/20 178 lb 3.2 oz (80.8  kg)     GEN: Well nourished, well developed in no acute distress HEENT: Normal NECK: No JVD; No carotid bruits LYMPHATICS: No lymphadenopathy CARDIAC: S1S2 noted,RRR, no murmurs, rubs, gallops RESPIRATORY:  Clear to auscultation without rales, wheezing or rhonchi  ABDOMEN: Soft, non-tender, non-distended, +bowel sounds, no guarding. EXTREMITIES: No edema, No cyanosis, no clubbing MUSCULOSKELETAL:  No deformity  SKIN: Warm and dry NEUROLOGIC:  Alert and oriented x 3, non-focal PSYCHIATRIC:  Normal affect, good insight  ASSESSMENT:    1. Persistent atrial fibrillation ([place])   2. Bilateral leg edema   3. Hypertension, essential    PLAN:     She did respond well to her diuretic therapy.  We will continue to keep the patient on her current dose of torsemide.  I will get blood work today to assess her kidney function as well as electrolytes.  Continue patient her Coumadin as well as metoprolol for atrial fibrillation.  Her blood pressure manually performed by me 108/80 mmHg.  The patient is in agreement with the above plan. The patient left the office in stable condition.  The patient will follow up in 1 month or sooner if needed.   Medication Adjustments/Labs and Tests Ordered: Current medicines are reviewed at length with the patient today.  Concerns regarding medicines are outlined above.  Orders Placed This Encounter  Procedures  . Basic metabolic panel  . Magnesium   No orders of the defined types were placed in this encounter.   Patient Instructions  Medication Instructions:  Your physician recommends that you continue on your current medications  as directed. Please refer to the Current Medication list given to you today.  *If you need a refill on your cardiac medications before your next appointment, please call your pharmacy*   Lab Work: Your physician recommends that you return for lab work Bmet and Magnesium  If you have labs (blood work) drawn today and your tests are completely normal, you will receive your results only by: Marland Kitchen MyChart Message (if you have MyChart) OR . A paper copy in the mail If you have any lab test that is abnormal or we need to change your treatment, we will call you to review the results.   Testing/Procedures: NONE   Follow-Up: At [facility], you and your health needs are our priority.  As part of our continuing mission to provide you with exceptional heart care, we have created designated Provider Care Teams.  These Care Teams include your primary Cardiologist (physician) and Advanced Practice Providers (APPs -  Physician Assistants and Nurse Practitioners) who all work together to provide you with the care you need, when you need it.  We recommend signing up for the patient portal called "MyChart".  Sign up information is provided on this After Visit Summary.  MyChart is used to connect with patients for Virtual Visits (Telemedicine).  Patients are able to view lab/test results, encounter notes, upcoming appointments, etc.  Non-urgent messages can be sent to your provider as well.   To learn more about what you can do with MyChart, go to [email or web address].    Your next appointment:   1 month(s)  The format for your next appointment:   In Person  Provider:   Berniece Salines, DO   Other Instructions NONE     Adopting a Healthy Lifestyle.  Know what a healthy weight is for you (roughly BMI <25) and aim to maintain this   Aim for 7+ servings of fruits and vegetables daily  65-80+ fluid ounces of water or unsweet tea for healthy kidneys   Limit to max 1 drink of alcohol per day;  avoid smoking/tobacco   Limit animal fats in diet for cholesterol and heart health - choose grass fed whenever available   Avoid highly processed foods, and foods high in saturated/trans fats   Aim for low stress - take time to unwind and care for your mental health   Aim for 150 min of moderate intensity exercise weekly for heart health, and weights twice weekly for bone health   Aim for 7-9 hours of sleep daily   When it comes to diets, agreement about the perfect plan isnt easy to find, even among the experts. Experts at the [employer] developed an idea known as the Healthy Eating Plate. Just imagine a plate divided into logical, healthy portions.   The emphasis is on diet quality:   Load up on vegetables and fruits - one-half of your plate: Aim for color and variety, and remember that potatoes dont count.   Go for whole grains - one-quarter of your plate: Whole wheat, barley, wheat berries, quinoa, oats, brown rice, and foods made with them. If you want pasta, go with whole wheat pasta.   Protein power - one-quarter of your plate: Fish, chicken, beans, and nuts are all healthy, versatile protein sources. Limit red meat.   The diet, however, does go beyond the plate, offering a few other suggestions.   Use healthy plant oils, such as olive, canola, soy, corn, sunflower and peanut. Check the labels, and avoid partially hydrogenated oil, which have unhealthy trans fats.   If youre thirsty, drink water. Coffee and tea are good in moderation, but skip sugary drinks and limit milk and dairy products to one or two daily servings.   The type of carbohydrate in the diet is more important than the amount. Some sources of carbohydrates, such as vegetables, fruits, whole grains, and beans-are healthier than others.   Finally, stay active  Signed, Berniece Salines, DO  02/17/2020 4:08 PM    [facility]

## 2020-02-18 LAB — MAGNESIUM: Magnesium: 1.6 mg/dL (ref 1.6–2.3)

## 2020-02-18 LAB — BASIC METABOLIC PANEL
BUN/Creatinine Ratio: 31 — ABNORMAL HIGH (ref 12–28)
BUN: 51 mg/dL — ABNORMAL HIGH (ref 8–27)
CO2: 28 mmol/L (ref 20–29)
Calcium: 9.5 mg/dL (ref 8.7–10.3)
Chloride: 100 mmol/L (ref 96–106)
Creatinine, Ser: 1.64 mg/dL — ABNORMAL HIGH (ref 0.57–1.00)
GFR calc Af Amer: 33 mL/min/{1.73_m2} — ABNORMAL LOW (ref >59)
GFR calc non Af Amer: 29 mL/min/{1.73_m2} — ABNORMAL LOW (ref >59)
Glucose: 142 mg/dL — ABNORMAL HIGH (ref 65–99)
Potassium: 4.1 mmol/L (ref 3.5–5.2)
Sodium: 144 mmol/L (ref 134–144)

## 2020-02-20 ENCOUNTER — Telehealth: Payer: Self-pay

## 2020-02-20 NOTE — Telephone Encounter (Signed)
Spoke with patient regarding results and recommendation.  Patient verbalizes understanding and is agreeable to plan of care. Advised patient to call back with any issues or concerns.  

## 2020-02-20 NOTE — Telephone Encounter (Signed)
Left message on patients voicemail to please return our call.   

## 2020-02-20 NOTE — Telephone Encounter (Signed)
-----   Message from Berniece Salines, DO sent at 02/19/2020 11:31 AM EDT ----- Cr at baseline, stable labs. Continue with current medications

## 2020-02-21 ENCOUNTER — Ambulatory Visit (INDEPENDENT_AMBULATORY_CARE_PROVIDER_SITE_OTHER): Payer: Medicare Other

## 2020-02-21 ENCOUNTER — Other Ambulatory Visit: Payer: Self-pay

## 2020-02-21 DIAGNOSIS — Z5181 Encounter for therapeutic drug level monitoring: Secondary | ICD-10-CM

## 2020-02-21 DIAGNOSIS — I480 Paroxysmal atrial fibrillation: Secondary | ICD-10-CM | POA: Diagnosis not present

## 2020-02-21 LAB — POCT INR: INR: 2.7 (ref 2.0–3.0)

## 2020-02-21 NOTE — Patient Instructions (Signed)
Continue taking 1/2 tablet daily except 1 tablet each Tuesday and Saturday  Stay consistent with greens/salads Recheck in 6 weeks. Call with new medications or any procedures  [phone].

## 2020-02-27 ENCOUNTER — Other Ambulatory Visit: Payer: Medicare Other

## 2020-02-27 ENCOUNTER — Other Ambulatory Visit: Payer: Self-pay

## 2020-02-27 DIAGNOSIS — Z1231 Encounter for screening mammogram for malignant neoplasm of breast: Secondary | ICD-10-CM

## 2020-02-27 DIAGNOSIS — E785 Hyperlipidemia, unspecified: Secondary | ICD-10-CM

## 2020-02-27 DIAGNOSIS — I10 Essential (primary) hypertension: Secondary | ICD-10-CM | POA: Diagnosis not present

## 2020-02-27 DIAGNOSIS — E119 Type 2 diabetes mellitus without complications: Secondary | ICD-10-CM | POA: Diagnosis not present

## 2020-02-28 LAB — HEMOGLOBIN A1C
Est. average glucose Bld gHb Est-mCnc: 146 mg/dL
Hgb A1c MFr Bld: 6.7 % — ABNORMAL HIGH (ref 4.8–5.6)

## 2020-02-28 LAB — COMPREHENSIVE METABOLIC PANEL
ALT: 19 IU/L (ref 0–32)
AST: 25 IU/L (ref 0–40)
Albumin/Globulin Ratio: 1.6 (ref 1.2–2.2)
Albumin: 4.2 g/dL (ref 3.6–4.6)
Alkaline Phosphatase: 114 IU/L (ref 44–121)
BUN/Creatinine Ratio: 31 — ABNORMAL HIGH (ref 12–28)
BUN: 51 mg/dL — ABNORMAL HIGH (ref 8–27)
Bilirubin Total: 0.2 mg/dL (ref 0.0–1.2)
CO2: 28 mmol/L (ref 20–29)
Calcium: 9.6 mg/dL (ref 8.7–10.3)
Chloride: 102 mmol/L (ref 96–106)
Creatinine, Ser: 1.65 mg/dL — ABNORMAL HIGH (ref 0.57–1.00)
GFR calc Af Amer: 33 mL/min/{1.73_m2} — ABNORMAL LOW (ref >59)
GFR calc non Af Amer: 29 mL/min/{1.73_m2} — ABNORMAL LOW (ref >59)
Globulin, Total: 2.7 g/dL (ref 1.5–4.5)
Glucose: 122 mg/dL — ABNORMAL HIGH (ref 65–99)
Potassium: 3.9 mmol/L (ref 3.5–5.2)
Sodium: 144 mmol/L (ref 134–144)
Total Protein: 6.9 g/dL (ref 6.0–8.5)

## 2020-02-28 LAB — LIPID PANEL
Chol/HDL Ratio: 2.4 ratio (ref 0.0–4.4)
Cholesterol, Total: 132 mg/dL (ref 100–199)
HDL: 56 mg/dL (ref >39)
LDL Chol Calc (NIH): 59 mg/dL (ref 0–99)
Triglycerides: 87 mg/dL (ref 0–149)
VLDL Cholesterol Cal: 17 mg/dL (ref 5–40)

## 2020-02-28 LAB — CBC WITH DIFF/PLATELET
Basophils Absolute: 0 10*3/uL (ref 0.0–0.2)
Basos: 0 %
EOS (ABSOLUTE): 0 10*3/uL (ref 0.0–0.4)
Eos: 1 %
Hematocrit: 32 % — ABNORMAL LOW (ref 34.0–46.6)
Hemoglobin: 10.8 g/dL — ABNORMAL LOW (ref 11.1–15.9)
Immature Grans (Abs): 0 10*3/uL (ref 0.0–0.1)
Immature Granulocytes: 1 %
Lymphocytes Absolute: 1 10*3/uL (ref 0.7–3.1)
Lymphs: 16 %
MCH: 31.8 pg (ref 26.6–33.0)
MCHC: 33.8 g/dL (ref 31.5–35.7)
MCV: 94 fL (ref 79–97)
Monocytes Absolute: 0.6 10*3/uL (ref 0.1–0.9)
Monocytes: 9 %
Neutrophils Absolute: 4.8 10*3/uL (ref 1.4–7.0)
Neutrophils: 73 %
Platelets: 222 10*3/uL (ref 150–450)
RBC: 3.4 x10E6/uL — ABNORMAL LOW (ref 3.77–5.28)
RDW: 13 % (ref 11.7–15.4)
WBC: 6.5 10*3/uL (ref 3.4–10.8)

## 2020-02-28 LAB — CARDIOVASCULAR RISK ASSESSMENT

## 2020-03-01 ENCOUNTER — Encounter: Payer: Self-pay | Admitting: Family Medicine

## 2020-03-01 ENCOUNTER — Ambulatory Visit (INDEPENDENT_AMBULATORY_CARE_PROVIDER_SITE_OTHER): Payer: Medicare Other | Admitting: Family Medicine

## 2020-03-01 ENCOUNTER — Telehealth: Payer: Self-pay

## 2020-03-01 ENCOUNTER — Other Ambulatory Visit: Payer: Self-pay

## 2020-03-01 VITALS — BP 110/58 | HR 70 | Temp 97.3°F | Resp 18 | Ht 62.0 in | Wt 172.2 lb

## 2020-03-01 DIAGNOSIS — E1122 Type 2 diabetes mellitus with diabetic chronic kidney disease: Secondary | ICD-10-CM

## 2020-03-01 DIAGNOSIS — E782 Mixed hyperlipidemia: Secondary | ICD-10-CM

## 2020-03-01 DIAGNOSIS — I131 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, with stage 1 through stage 4 chronic kidney disease, or unspecified chronic kidney disease: Secondary | ICD-10-CM

## 2020-03-01 DIAGNOSIS — M810 Age-related osteoporosis without current pathological fracture: Secondary | ICD-10-CM

## 2020-03-01 DIAGNOSIS — D6869 Other thrombophilia: Secondary | ICD-10-CM

## 2020-03-01 DIAGNOSIS — I130 Hypertensive heart and chronic kidney disease with heart failure and stage 1 through stage 4 chronic kidney disease, or unspecified chronic kidney disease: Secondary | ICD-10-CM | POA: Diagnosis not present

## 2020-03-01 DIAGNOSIS — Z23 Encounter for immunization: Secondary | ICD-10-CM

## 2020-03-01 DIAGNOSIS — K743 Primary biliary cirrhosis: Secondary | ICD-10-CM

## 2020-03-01 DIAGNOSIS — N184 Chronic kidney disease, stage 4 (severe): Secondary | ICD-10-CM | POA: Diagnosis not present

## 2020-03-01 DIAGNOSIS — D631 Anemia in chronic kidney disease: Secondary | ICD-10-CM | POA: Diagnosis not present

## 2020-03-01 MED ORDER — DENOSUMAB 60 MG/ML ~~LOC~~ SOSY
60.0000 mg | PREFILLED_SYRINGE | Freq: Once | SUBCUTANEOUS | Status: AC
  Administered 2020-03-01: 60 mg via SUBCUTANEOUS

## 2020-03-01 NOTE — Telephone Encounter (Signed)
Pt called this afternoon wanting to let the nurse know when she had her Covid shots. Pfizer 1.) 05/28/2019 2.) 06/18/2019.----This was done at [facility]. Booster shot was done at our office 02/09/2020

## 2020-03-01 NOTE — Progress Notes (Signed)
Subjective:  Patient ID: Sara Hancock, female    DOB: 08/06/1934  Age: 85 y.o. MRN: [id number]  Chief Complaint  Patient presents with  . Hypertension  . Hyperlipidemia      Sara Hancock presents with type 2 diabetes mellitus with complications of hyperlipidemia and CKD stage 4.  Compliance with treatment has been good; she takes her medication as directed, maintains her diet and exercise regimen, follows up as directed, and is keeping a glucose diary.  Date of diagnosis 2004.  Current meds include an oral hypoglycemic ( janumet ) and insulin/injectable ( levemir 32 U in am and 18 U in pm ).  She reports home blood glucose readings have averaged fasting readings in the < 130 mg/dL range. She checks her glucose 2 times per day. In am her sugars are 98-118 and in the evening sugars are 160-185.  Her A1C this week was 6.8. In regard to preventative care, she performs foot self-exams daily and her last ophthalmology exam was in 04/14/2019.      Pt presents with hyperlipidemia.  Current treatment includes Zocor.  Compliance with treatment has been good; she takes her medication as directed, maintains her low cholesterol diet, follows up as directed, and maintains her exercise regimen.  She denies experiencing any hypercholesterolemia related symptoms.      Pt presents for follow up of hypertensive CKD stage 4 and diastolic heart failur.  Her current cardiac medication regimen includes Metoprolol 100 mg one twice a day, Irbesartan 300 mg once daily and hydralazine 100 mg one three times a day.  She is tolerating the medication well without side effects.  Compliance with treatment has been good; she takes her medication as directed, maintains her diet and exercise regimen, and follows up as directed.      Follow up of paroxysmal atrial fibrillation.  The patient follows up with Dr. [facility] every six months. Monitored by the coumadin clinic. She is currently on metoprolol. Patient's lasix was  changed to torsemide 40 mg  twice a  Day.   Denies SOB when lying down. Her last echo was in 01/09/2020 and showed no CHF.   Denis presents with a diagnosis of primary biliary cirrhosis.  The course has been stable and nonprogressive.  Pt sees Dr. Fuller Plan.  Had her annual visit in December 2020. She gets an [place] every 6-12 months. She received a good report. Has gotten hepatitis A and B vaccines. Currently on actigall.   Past Medical History:  Diagnosis Date  . Anemia   . Arthritis   . Biliary cirrhosis ([place])   . Cholelithiasis 01/09/12  . Chronic kidney disease, stage 3 ([place])   . Diverticulosis of colon with hemorrhage 06/08/2000  . Esophageal stenosis 01/08/11  . Esophagitis 01/08/11  . GERD (gastroesophageal reflux disease)   . Hiatal hernia 01/08/11  . HTN (hypertension)   . Hx of adenomatous colonic polyps 01/08/11  . Hyperplastic colon polyp 06/08/2000, 07/31/2003, 01/08/11  . 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 Localized edema   . Long term (current) use of insulin ([place])   . Mixed hyperlipidemia   . Obesity   . Osteopenia   . Other iron deficiency anemias   . Other specified menopausal and perimenopausal disorders   . PAF (paroxysmal atrial fibrillation) (HCC)    occurring in the setting of bradycardia and mild first degree AV block followed  by Dr. Jens Som  . Pain in thoracic spine   . Pure hypercholesterolemia   . Type II or unspecified type diabetes mellitus without mention of complication, not stated as uncontrolled   . Uterine fibroid 01/09/12   Past Surgical History:  Procedure Laterality Date  . BREAST CYST EXCISION     left  . CARDIOVERSION N/A 11/27/2017   Procedure: CARDIOVERSION;  Surgeon: Sanda Klein, MD;  Location: [facility];  Service: Cardiovascular;  Laterality: N/A;  . COLONOSCOPY    . INSERTION OF MESH  05/18/2012   Procedure: INSERTION  OF MESH;  Surgeon: Adin Hector, MD;  Location: [facility];  Service: General;  Laterality: N/A;  . LIVER BIOPSY  05/18/2012   Procedure: LIVER BIOPSY;  Surgeon: Adin Hector, MD;  Location: [facility];  Service: General;;  . POLYPECTOMY    . THYROIDECTOMY, PARTIAL  1980  . TUBAL LIGATION  1972  . VENTRAL HERNIA REPAIR  05/18/2012   Procedure: LAPAROSCOPIC VENTRAL HERNIA;  Surgeon: Adin Hector, MD;  Location: [facility];  Service: General;  Laterality: N/A;  Laparoscopic Ventral Wall Hernia with Mesh    Family History  Problem Relation Age of Onset  . Heart attack Father   . Stroke Mother   . Diabetes Sister        x 2  . Aneurysm Sister   . Stroke Sister   . Congestive Heart Failure Sister   . Diabetes Brother   . Kidney failure Other   . Hyperlipidemia Other   . Arrhythmia Other   . Congestive Heart Failure Other   . Hypertension Other   . Arthritis Other   . Colon cancer Neg Hx   . Esophageal cancer Neg Hx   . Stomach cancer Neg Hx   . Rectal cancer Neg Hx    Social History   Socioeconomic History  . Marital status: Married    Spouse name: Not on file  . Number of children: 2  . Years of education: Not on file  . Highest education level: Not on file  Occupational History  . Occupation: retired    Fish farm manager: RETIRED  Tobacco Use  . Smoking status: Never Smoker  . Smokeless tobacco: Never Used  Vaping Use  . Vaping Use: Never used  Substance and Sexual Activity  . Alcohol use: No  . Drug use: No  . Sexual activity: Not Currently  Other Topics Concern  . Not on file  Social History Narrative  . Not on file   Social Determinants of Health   Financial Resource Strain:   . Difficulty of Paying Living Expenses: Not on file  Food Insecurity:   . Worried About Charity fundraiser in the Last Year: Not on file  . Ran Out of Food in the Last Year: Not on file  Transportation Needs:   . Lack of Transportation (Medical): Not on file  . Lack of Transportation  (Non-Medical): Not on file  Physical Activity:   . Days of Exercise per Week: Not on file  . Minutes of Exercise per Session: Not on file  Stress:   . Feeling of Stress : Not on file  Social Connections:   . Frequency of Communication with Friends and Family: Not on file  . Frequency of Social Gatherings with Friends and Family: Not on file  . Attends Religious Services: Not on file  . Active Member of Clubs or Organizations: Not on file  . Attends Archivist Meetings: Not on file  .  Marital Status: Not on file    Review of Systems  Constitutional: Negative for fatigue and fever.  HENT: Positive for congestion. Negative for ear pain, rhinorrhea, sinus pressure and sore throat.   Eyes: Negative for pain.  Respiratory: Negative for cough, chest tightness, shortness of breath and wheezing.   Cardiovascular: Negative for chest pain, palpitations and leg swelling.  Gastrointestinal: Negative for abdominal pain, constipation, diarrhea, nausea and vomiting.  Endocrine: Negative for polydipsia, polyphagia and polyuria.  Genitourinary: Negative for dysuria and hematuria.  Musculoskeletal: Positive for back pain (low back pain. Tizanidine. ). Negative for arthralgias, joint swelling and myalgias.  Skin: Negative for rash.  Neurological: Negative for dizziness, weakness and headaches.  Psychiatric/Behavioral: Negative for dysphoric mood. The patient is not nervous/anxious.      Objective:  BP (!) 110/58   Pulse 70   Temp (!) 97.3 F (36.3 C)   Resp 18   Ht 5\' 2"  (1.575 m)   Wt 172 lb 3.2 oz (78.1 kg)   BMI 31.50 kg/m   BP/Weight 03/01/2020 75/05/256 09/10/7780  Systolic BP 423 536 144  Diastolic BP 58 80 80  Wt. (Lbs) 172.2 171 171  BMI 31.5 30.83 30.83   Physical Exam Vitals reviewed.  Constitutional:      Appearance: She is well-developed. She is obese.  Neck:     Vascular: No carotid bruit.  Cardiovascular:     Rate and Rhythm: Normal rate. Rhythm irregular.      Heart sounds: No murmur heard.   Pulmonary:     Effort: Pulmonary effort is normal.     Breath sounds: Normal breath sounds.  Abdominal:     General: Bowel sounds are normal.     Palpations: Abdomen is soft. There is no mass.     Tenderness: There is no abdominal tenderness.  Musculoskeletal:     Right lower leg: Edema (1+) present.     Left lower leg: Edema (1+) present.  Neurological:     Mental Status: She is alert and oriented to person, place, and time.  Psychiatric:        Mood and Affect: Mood normal.        Behavior: Behavior normal.    Lab Results  Component Value Date   WBC 6.5 02/27/2020   HGB 10.8 (L) 02/27/2020   HCT 32.0 (L) 02/27/2020   PLT 222 02/27/2020   GLUCOSE 122 (H) 02/27/2020   CHOL 132 02/27/2020   TRIG 87 02/27/2020   HDL 56 02/27/2020   LDLCALC 59 02/27/2020   ALT 19 02/27/2020   AST 25 02/27/2020   NA 144 02/27/2020   K 3.9 02/27/2020   CL 102 02/27/2020   CREATININE 1.65 (H) 02/27/2020   BUN 51 (H) 02/27/2020   CO2 28 02/27/2020   TSH 1.67 02/26/2017   INR 2.7 02/21/2020   HGBA1C 6.7 (H) 02/27/2020      Assessment & Plan:  1. CKD stage 4 due to type 2 diabetes mellitus (HCC) Control: good Recommend check sugars fasting daily. Recommend check feet daily. Recommend annual eye exams. Medicines: no changes. Consider SGL2. Continue to work on eating a healthy diet and exercise.  - Ambulatory referral to Chronic Care Management Services   2. Hypertensive heart and renal disease with renal failure, stage 1 through stage 4 or unspecified chronic kidney disease, with heart failure ([place]) The current medical regimen is effective;  continue present plan and medications. Improved.  - Ambulatory referral to Chronic Care Management Services  3.  Acquired thrombophilia ([place]) Due to warfarin. Managed by cardiology  4. Primary biliary cholangitis (HCC) Management per specialist.  Dr. Fuller Plan  5. Need for immunization against influenza - Flu  Vaccine QUAD High Dose(Fluad)  6. Chronic kidney disease, stage IV (severe) (HCC) Stable.Still waiting on nephrology referral.  I will have our new referral staff member follow this up.   7. Anemia due to stage 4 chronic kidney disease (HCC) Multifactorial. History of iron deficiency and b12 deficiency.  Have pt return for iron studies and b12/folate/mma. Reviewed chart after pt left.  These levels have not been checked since 2019.  8. Mixed hyperlipidemia The current medical regimen is effective;  continue present plan and medications. Recommend continue to work on eating healthy diet and exercise.  Follow-up: Return in about 3 months (around 06/01/2020) for fasting. I spent 40 minutes with the patient and in review of her chart and specialist notes.  An After Visit Summary was printed and given to the patient.  Rochel Brome [facility] [phone]

## 2020-03-12 ENCOUNTER — Other Ambulatory Visit: Payer: Self-pay

## 2020-03-12 DIAGNOSIS — D508 Other iron deficiency anemias: Secondary | ICD-10-CM

## 2020-03-12 DIAGNOSIS — E538 Deficiency of other specified B group vitamins: Secondary | ICD-10-CM

## 2020-03-13 ENCOUNTER — Other Ambulatory Visit: Payer: Medicare Other

## 2020-03-13 ENCOUNTER — Other Ambulatory Visit: Payer: Self-pay

## 2020-03-13 DIAGNOSIS — D508 Other iron deficiency anemias: Secondary | ICD-10-CM

## 2020-03-13 DIAGNOSIS — E538 Deficiency of other specified B group vitamins: Secondary | ICD-10-CM

## 2020-03-13 DIAGNOSIS — I4819 Other persistent atrial fibrillation: Secondary | ICD-10-CM | POA: Diagnosis not present

## 2020-03-13 DIAGNOSIS — R0602 Shortness of breath: Secondary | ICD-10-CM | POA: Diagnosis not present

## 2020-03-14 LAB — MAGNESIUM: Magnesium: 1.6 mg/dL (ref 1.6–2.3)

## 2020-03-16 LAB — METHYLMALONIC ACID, SERUM: Methylmalonic Acid: 365 nmol/L (ref 0–378)

## 2020-03-16 LAB — FERRITIN: Ferritin: 52 ng/mL (ref 15–150)

## 2020-03-16 LAB — IRON AND TIBC
Iron Saturation: 15 % (ref 15–55)
Iron: 47 ug/dL (ref 27–139)
Total Iron Binding Capacity: 306 ug/dL (ref 250–450)
UIBC: 259 ug/dL (ref 118–369)

## 2020-03-16 LAB — B12 AND FOLATE PANEL
Folate: >20 ng/mL (ref >3.0)
Vitamin B-12: 847 pg/mL (ref 232–1245)

## 2020-03-21 ENCOUNTER — Telehealth: Payer: Self-pay | Admitting: Gastroenterology

## 2020-03-21 DIAGNOSIS — K743 Primary biliary cirrhosis: Secondary | ICD-10-CM

## 2020-03-21 NOTE — Telephone Encounter (Signed)
CMP, CBC, PT/INR, AFP and RUQ [place]

## 2020-03-21 NOTE — Telephone Encounter (Signed)
Patient is scheduled for appt on 05/29/19 with Dr. Fuller Plan. She states she usually gets labs prior to visit. Please advise Dr. Fuller Plan since she just had labs in August, what labs you want her to have in January.

## 2020-03-21 NOTE — Telephone Encounter (Signed)
Lab orders put in for 05/17/20. Will schedule RUQ u/s when it gets closer to her appt.

## 2020-03-23 ENCOUNTER — Ambulatory Visit: Payer: Medicare Other | Admitting: Cardiology

## 2020-03-23 DIAGNOSIS — I131 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, with stage 1 through stage 4 chronic kidney disease, or unspecified chronic kidney disease: Secondary | ICD-10-CM | POA: Insufficient documentation

## 2020-03-23 DIAGNOSIS — D508 Other iron deficiency anemias: Secondary | ICD-10-CM | POA: Insufficient documentation

## 2020-03-23 DIAGNOSIS — K743 Primary biliary cirrhosis: Secondary | ICD-10-CM | POA: Insufficient documentation

## 2020-03-23 DIAGNOSIS — R600 Localized edema: Secondary | ICD-10-CM | POA: Insufficient documentation

## 2020-03-23 DIAGNOSIS — N958 Other specified menopausal and perimenopausal disorders: Secondary | ICD-10-CM | POA: Insufficient documentation

## 2020-03-23 DIAGNOSIS — Z794 Long term (current) use of insulin: Secondary | ICD-10-CM | POA: Insufficient documentation

## 2020-03-23 DIAGNOSIS — I480 Paroxysmal atrial fibrillation: Secondary | ICD-10-CM | POA: Insufficient documentation

## 2020-03-23 DIAGNOSIS — K745 Biliary cirrhosis, unspecified: Secondary | ICD-10-CM | POA: Insufficient documentation

## 2020-03-23 DIAGNOSIS — N183 Chronic kidney disease, stage 3 unspecified: Secondary | ICD-10-CM | POA: Insufficient documentation

## 2020-03-23 DIAGNOSIS — M8580 Other specified disorders of bone density and structure, unspecified site: Secondary | ICD-10-CM | POA: Insufficient documentation

## 2020-03-23 DIAGNOSIS — M1990 Unspecified osteoarthritis, unspecified site: Secondary | ICD-10-CM | POA: Insufficient documentation

## 2020-03-23 DIAGNOSIS — K219 Gastro-esophageal reflux disease without esophagitis: Secondary | ICD-10-CM | POA: Insufficient documentation

## 2020-03-23 DIAGNOSIS — M546 Pain in thoracic spine: Secondary | ICD-10-CM | POA: Insufficient documentation

## 2020-03-23 DIAGNOSIS — E782 Mixed hyperlipidemia: Secondary | ICD-10-CM | POA: Insufficient documentation

## 2020-03-23 DIAGNOSIS — K635 Polyp of colon: Secondary | ICD-10-CM | POA: Insufficient documentation

## 2020-03-23 DIAGNOSIS — E7800 Pure hypercholesterolemia, unspecified: Secondary | ICD-10-CM | POA: Insufficient documentation

## 2020-03-26 ENCOUNTER — Encounter: Payer: Self-pay | Admitting: Cardiology

## 2020-03-26 ENCOUNTER — Ambulatory Visit (INDEPENDENT_AMBULATORY_CARE_PROVIDER_SITE_OTHER): Payer: Medicare Other | Admitting: Cardiology

## 2020-03-26 ENCOUNTER — Other Ambulatory Visit: Payer: Self-pay

## 2020-03-26 VITALS — BP 104/58 | HR 80 | Ht 67.0 in | Wt 173.4 lb

## 2020-03-26 DIAGNOSIS — I131 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, with stage 1 through stage 4 chronic kidney disease, or unspecified chronic kidney disease: Secondary | ICD-10-CM

## 2020-03-26 DIAGNOSIS — I4819 Other persistent atrial fibrillation: Secondary | ICD-10-CM | POA: Diagnosis not present

## 2020-03-26 DIAGNOSIS — I10 Essential (primary) hypertension: Secondary | ICD-10-CM | POA: Diagnosis not present

## 2020-03-26 LAB — BASIC METABOLIC PANEL
BUN/Creatinine Ratio: 27 (ref 12–28)
BUN: 55 mg/dL — ABNORMAL HIGH (ref 8–27)
CO2: 26 mmol/L (ref 20–29)
Calcium: 9.3 mg/dL (ref 8.7–10.3)
Chloride: 100 mmol/L (ref 96–106)
Creatinine, Ser: 2.03 mg/dL — ABNORMAL HIGH (ref 0.57–1.00)
GFR calc Af Amer: 26 mL/min/{1.73_m2} — ABNORMAL LOW (ref >59)
GFR calc non Af Amer: 22 mL/min/{1.73_m2} — ABNORMAL LOW (ref >59)
Glucose: 179 mg/dL — ABNORMAL HIGH (ref 65–99)
Potassium: 4.2 mmol/L (ref 3.5–5.2)
Sodium: 141 mmol/L (ref 134–144)

## 2020-03-26 LAB — MAGNESIUM: Magnesium: 1.7 mg/dL (ref 1.6–2.3)

## 2020-03-26 NOTE — Patient Instructions (Signed)
Medication Instructions:  No medication changes. *If you need a refill on your cardiac medications before your next appointment, please call your pharmacy*   Lab Work: Your physician recommends that you have labs done in the office today. Your test included  basic metabolic panel and magnesium.  If you have labs (blood work) drawn today and your tests are completely normal, you will receive your results only by: . MyChart Message (if you have MyChart) OR . A paper copy in the mail If you have any lab test that is abnormal or we need to change your treatment, we will call you to review the results.   Testing/Procedures: None ordered   Follow-Up: At [facility], you and your health needs are our priority.  As part of our continuing mission to provide you with exceptional heart care, we have created designated Provider Care Teams.  These Care Teams include your primary Cardiologist (physician) and Advanced Practice Providers (APPs -  Physician Assistants and Nurse Practitioners) who all work together to provide you with the care you need, when you need it.  We recommend signing up for the patient portal called "MyChart".  Sign up information is provided on this After Visit Summary.  MyChart is used to connect with patients for Virtual Visits (Telemedicine).  Patients are able to view lab/test results, encounter notes, upcoming appointments, etc.  Non-urgent messages can be sent to your provider as well.   To learn more about what you can do with MyChart, go to [email or web address].    Your next appointment:   3 month(s)  The format for your next appointment:   In Person  Provider:   Kardie Tobb, DO   Other Instructions NA  

## 2020-03-26 NOTE — Progress Notes (Signed)
Cardiology Office Note:    Date:  03/26/2020   ID:  Sara Hancock, DOB Jun 10, 1934, MRN [id number]  PCP:  Sara Brome, MD  Cardiologist:  Sara Salines, DO  Electrophysiologist:  None   Referring MD: Sara Brome, MD   " I am doing better"  History of Present Illness:    Sara Hancock is a 85 y.o. female with a hx of atrial fibrillation on Coumadin and metoprolol, diastolic heart failure, hypertension presents today for follow-up visit.  At the patient last visit we discussed keeping her on her torsemide 40 mg twice a day due to significant bilateral leg edema.  She is here today for follow-up visit.  She tells me she is felt the most improve over the last few weeks.  She is happy about this.  She denies any chest pain or shortness of breath.  And tells me her bilateral leg edema has improved significantly as well.  Past Medical History:  Diagnosis Date  . Acquired thrombophilia ([place]) 09/11/2019  . Allergic rhinitis 09/17/2015  . Anemia   . ANEMIA, NORMOCYTIC 10/27/2007   Qualifier: Diagnosis of  By: Nils Pyle CMA (AAMA), Mearl Latin    . Anticoagulant long-term use 01/08/2011  . Arthritis   . Benign neoplasm of colon 01/08/2011  . Biliary cirrhosis ([place])   . BRADYCARDIA 11/15/2008   Qualifier: Diagnosis of  By: Hollie Salk CMA, Estill Bamberg    . Cellulitis of left foot 09/25/2016  . Cholelithiasis 01/09/12  . Chronic kidney disease, stage 3 ([place])   . Combined form of age-related cataract, both eyes 02/26/2017  . Diabetes mellitus type 2 without retinopathy ([place]) 10/27/2013  . Diverticulosis of colon with hemorrhage 06/08/2000  . Dyslipidemia 09/17/2015  . Encounter for therapeutic drug monitoring 06/09/2013  . Esophageal reflux 01/08/2011  . Esophageal stenosis 01/08/11  . Esophagitis 01/08/11  . GALLSTONES 10/26/2008   Qualifier: Diagnosis of  By: Sharlett Iles MD Byrd Hesselbach Gastritis, erosive chronic 01/08/2011  . GERD (gastroesophageal reflux disease)   . Glaucoma suspect of both eyes  02/26/2017  . Hiatal hernia 01/08/11  . History of IBS 10/27/2007   Qualifier: Diagnosis of  By: Nils Pyle CMA ([place]), Mearl Latin    . HTN (hypertension)   . Hx of adenomatous colonic polyps 01/08/11  . Hyperplastic colon polyp 06/08/2000, 07/31/2003, 01/08/11  . Hypertension, essential 10/27/2007   Qualifier: Diagnosis of  By: Nils Pyle CMA (AAMA), Mearl Latin    . 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 Iron deficiency anemia 01/08/2011  . Left foot pain 09/25/2016  . Localized edema   . Long term (current) use of insulin ([place])   . Mixed hyperlipidemia   . Nuclear cataract 07/03/2011  . Obesity   . Obesity (BMI 30-39.9) 03/22/2012  . Osteoarthritis of right thumb 09/17/2015  . Osteopenia   . Other iron deficiency anemias   . Other specified menopausal and perimenopausal disorders   . PAF (paroxysmal atrial fibrillation) ([facility])    occurring in the setting of bradycardia and mild first degree AV block followed by Dr. Jens Hancock  . Pain in thoracic spine   . Paraesophageal hiatal hernia, 3cm by EGD 03/22/2012  . Pedal edema 12/13/2019  . Persistent atrial fibrillation ([place]) 08/28/2019  . Posterior vitreous detachment, bilateral 01/10/2016  . 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 Pure  hypercholesterolemia   . Retinal tear, right 07/03/2011  . Shortness of breath 12/13/2019  . Stage 3b chronic kidney disease ([place]) 09/11/2019  . Type II or unspecified type diabetes mellitus without mention of complication, not stated as uncontrolled   . Uterine fibroid 01/09/12    Past Surgical History:  Procedure Laterality Date  . BREAST CYST EXCISION     left  . CARDIOVERSION N/A 11/27/2017   Procedure: CARDIOVERSION;  Surgeon: Sanda Klein, MD;  Location: [facility];  Service: Cardiovascular;  Laterality: N/A;  . COLONOSCOPY    .  INSERTION OF MESH  05/18/2012   Procedure: INSERTION OF MESH;  Surgeon: Adin Hector, MD;  Location: [facility];  Service: General;  Laterality: N/A;  . LIVER BIOPSY  05/18/2012   Procedure: LIVER BIOPSY;  Surgeon: Adin Hector, MD;  Location: [facility];  Service: General;;  . POLYPECTOMY    . THYROIDECTOMY, PARTIAL  1980  . TUBAL LIGATION  1972  . VENTRAL HERNIA REPAIR  05/18/2012   Procedure: LAPAROSCOPIC VENTRAL HERNIA;  Surgeon: Adin Hector, MD;  Location: [facility];  Service: General;  Laterality: N/A;  Laparoscopic Ventral Wall Hernia with Mesh    Current Medications: Current Meds  Medication Sig  . ACCU-CHEK AVIVA PLUS test strip USE TO TEST TWICE DAILY  . Accu-Chek FastClix Lancets MISC USE AS DIRECTED TO CHECK BLOOD SUGAR TWICE DAILY  . calcium-vitamin D (OSCAL WITH D) 250-125 MG-UNIT per tablet Take 1 tablet by mouth daily.  . Cholecalciferol (VITAMIN D3) 1000 units CAPS Take 1,000 Units by mouth daily.  . fexofenadine (ALLEGRA) 180 MG tablet Take 180 mg by mouth daily.   . fluticasone (FLONASE) 50 MCG/ACT nasal spray Place 1 spray into both nostrils as needed.   . folic acid (FOLVITE) 262 MCG tablet Take 400 mcg by mouth daily.  . hydrALAZINE (APRESOLINE) 100 MG tablet Take 1 tablet (100 mg total) by mouth QID.  Marland Kitchen insulin detemir (LEVEMIR FLEXPEN) 100 UNIT/ML injection Inject into the skin as directed. 18 units at bedtime and 32 units in the a.m.  . irbesartan (AVAPRO) 300 MG tablet TAKE 1 TABLET BY MOUTH DAILY  . JANUMET 50-1000 MG tablet TAKE 1 TABLET BY MOUTH TWICE DAILY WITH MEALS  . ketoconazole (NIZORAL) 2 % cream Apply 1 application topically daily as needed. Around skin folds for irritation  . metoprolol tartrate (LOPRESSOR) 100 MG tablet Take 1 tablet (100 mg total) by mouth 2 (two) times daily.  . Multiple Vitamin (MULTIVITAMIN WITH MINERALS) TABS Take 1 tablet by mouth daily.  . pantoprazole (PROTONIX) 40 MG tablet TAKE 1 TABLET BY MOUTH EVERY DAY  . Potassium Chloride ER  20 MEQ TBCR Take 1 tablet by mouth daily.  . simvastatin (ZOCOR) 40 MG tablet TAKE 1 TABLET BY MOUTH AT BEDTIME  . torsemide (DEMADEX) 20 MG tablet Take 2 tablets (40 mg total) by mouth 2 (two) times daily.  . ursodiol (ACTIGALL) 300 MG capsule TAKE 1 CAPSULE(300 MG) BY MOUTH TWICE DAILY  . vitamin B-12 (CYANOCOBALAMIN) 1000 MCG tablet Take 1,000 mcg by mouth daily.  . vitamin C (ASCORBIC ACID) 500 MG tablet Take 500 mg by mouth daily.  . vitamin E 400 UNIT capsule Take 400 Units by mouth daily.  Marland Kitchen warfarin (COUMADIN) 5 MG tablet TAKE AS DIRECTED BY COUMADIN CLINIC     Allergies:   Ace inhibitors, Capoten [captopril], and Neomycin-bacitracin zn-polymyx   Social History   Socioeconomic History  . Marital status: Married    Spouse  name: Not on file  . Number of children: 2  . Years of education: Not on file  . Highest education level: Not on file  Occupational History  . Occupation: retired    Fish farm manager: RETIRED  Tobacco Use  . Smoking status: Never Smoker  . Smokeless tobacco: Never Used  Vaping Use  . Vaping Use: Never used  Substance and Sexual Activity  . Alcohol use: No  . Drug use: No  . Sexual activity: Not Currently  Other Topics Concern  . Not on file  Social History Narrative  . Not on file   Social Determinants of Health   Financial Resource Strain:   . Difficulty of Paying Living Expenses: Not on file  Food Insecurity:   . Worried About Charity fundraiser in the Last Year: Not on file  . Ran Out of Food in the Last Year: Not on file  Transportation Needs:   . Lack of Transportation (Medical): Not on file  . Lack of Transportation (Non-Medical): Not on file  Physical Activity:   . Days of Exercise per Week: Not on file  . Minutes of Exercise per Session: Not on file  Stress:   . Feeling of Stress : Not on file  Social Connections:   . Frequency of Communication with Friends and Family: Not on file  . Frequency of Social Gatherings with Friends and  Family: Not on file  . Attends Religious Services: Not on file  . Active Member of Clubs or Organizations: Not on file  . Attends Archivist Meetings: Not on file  . Marital Status: Not on file     Family History: The patient's family history includes Aneurysm in her sister; Arrhythmia in an other family member; Arthritis in an other family member; Congestive Heart Failure in her sister and another family member; Diabetes in her brother and sister; Heart attack in her father; Hyperlipidemia in an other family member; Hypertension in an other family member; Kidney failure in an other family member; Stroke in her mother and sister. There is no history of Colon cancer, Esophageal cancer, Stomach cancer, or Rectal cancer.  ROS:   Review of Systems  Constitution: Negative for decreased appetite, fever and weight gain.  HENT: Negative for congestion, ear discharge, hoarse voice and sore throat.   Eyes: Negative for discharge, redness, vision loss in right eye and visual halos.  Cardiovascular: Negative for chest pain, dyspnea on exertion, leg swelling, orthopnea and palpitations.  Respiratory: Negative for cough, hemoptysis, shortness of breath and snoring.   Endocrine: Negative for heat intolerance and polyphagia.  Hematologic/Lymphatic: Negative for bleeding problem. Does not bruise/bleed easily.  Skin: Negative for flushing, nail changes, rash and suspicious lesions.  Musculoskeletal: Negative for arthritis, joint pain, muscle cramps, myalgias, neck pain and stiffness.  Gastrointestinal: Negative for abdominal pain, bowel incontinence, diarrhea and excessive appetite.  Genitourinary: Negative for decreased libido, genital sores and incomplete emptying.  Neurological: Negative for brief paralysis, focal weakness, headaches and loss of balance.  Psychiatric/Behavioral: Negative for altered mental status, depression and suicidal ideas.  Allergic/Immunologic: Negative for HIV exposure and  persistent infections.    EKGs/Labs/Other Studies Reviewed:    The following studies were reviewed today:   EKG: None today  Recent Labs: 01/09/2020: NT-Pro BNP 4,436 02/27/2020: ALT 19; BUN 51; Creatinine, Ser 1.65; Hemoglobin 10.8; Platelets 222; Potassium 3.9; Sodium 144 03/13/2020: Magnesium 1.6  Recent Lipid Panel    Component Value Date/Time   CHOL 132 02/27/2020 1613  TRIG 87 02/27/2020 1613   HDL 56 02/27/2020 1613   CHOLHDL 2.4 02/27/2020 1613   LDLCALC 59 02/27/2020 1613    Physical Exam:    VS:  BP (!) 104/58   Pulse 80   Ht 5\' 7"  (1.702 m)   Wt 173 lb 6.4 oz (78.7 kg)   SpO2 97%   BMI 27.16 kg/m     Wt Readings from Last 3 Encounters:  03/26/20 173 lb 6.4 oz (78.7 kg)  03/01/20 172 lb 3.2 oz (78.1 kg)  02/17/20 171 lb (77.6 kg)     GEN: Well nourished, well developed in no acute distress HEENT: Normal NECK: No JVD; No carotid bruits LYMPHATICS: No lymphadenopathy CARDIAC: S1S2 noted,RRR, no murmurs, rubs, gallops RESPIRATORY:  Clear to auscultation without rales, wheezing or rhonchi  ABDOMEN: Soft, non-tender, non-distended, +bowel sounds, no guarding. EXTREMITIES: +1 ankle edema, No cyanosis, no clubbing MUSCULOSKELETAL:  No deformity  SKIN: Warm and dry NEUROLOGIC:  Alert and oriented x 3, non-focal PSYCHIATRIC:  Normal affect, good insight  ASSESSMENT:    1. Persistent atrial fibrillation ([place])   2. Primary hypertension   3. 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  This will be her regimen for now.  What I like to do is get blood work to assess her kidney function as well as electrolytes.  Her blood pressure is at target. She will continue follow-up with our Coumadin clinic,, target INR is 2-3 for atrial fibrillation.  The patient  is in agreement with the above plan. The patient left the office in stable condition.  The patient will follow up in 3 months or sooner if needed.   Medication Adjustments/Labs and Tests Ordered: Current medicines are reviewed at length with the patient today.  Concerns regarding medicines are outlined above.  Orders Placed This Encounter  Procedures  . Basic metabolic panel  . Magnesium   No orders of the defined types were placed in this encounter.   Patient Instructions  Medication Instructions:  No medication changes. *If you need a refill on your cardiac medications before your next appointment, please call your pharmacy*   Lab Work: Your physician recommends that you have labs done in the office today. Your test included  basic metabolic panel and magnesium.  If you have labs (blood work) drawn today and your tests are completely normal, you will receive your results only by: Marland Kitchen MyChart Message (if you have MyChart) OR . A paper copy in the mail If you have any lab test that is abnormal or we need to change your treatment, we will call you to review the results.   Testing/Procedures: None ordered   Follow-Up: At [facility], you and your health needs are our priority.  As part of our continuing mission to provide you with exceptional heart care, we have created designated Provider Care Teams.  These Care Teams include your primary Cardiologist (physician) and Advanced Practice Providers (APPs -  Physician Assistants and Nurse Practitioners) who all work together to provide you with the care you need, when you need it.  We recommend signing up for the patient portal called "MyChart".  Sign up information is provided on this After Visit Summary.  MyChart is used to connect with patients for Virtual Visits (Telemedicine).  Patients are able  to view lab/test results, encounter notes, upcoming appointments, etc.  Non-urgent messages can be sent to your provider as well.   To  learn more about what you can do with MyChart, go to [email or web address].    Your next appointment:   3 month(s)  The format for your next appointment:   In Person  Provider:   Berniece Salines, DO   Other Instructions NA     Adopting a Healthy Lifestyle.  Know what a healthy weight is for you (roughly BMI <25) and aim to maintain this   Aim for 7+ servings of fruits and vegetables daily   65-80+ fluid ounces of water or unsweet tea for healthy kidneys   Limit to max 1 drink of alcohol per day; avoid smoking/tobacco   Limit animal fats in diet for cholesterol and heart health - choose grass fed whenever available   Avoid highly processed foods, and foods high in saturated/trans fats   Aim for low stress - take time to unwind and care for your mental health   Aim for 150 min of moderate intensity exercise weekly for heart health, and weights twice weekly for bone health   Aim for 7-9 hours of sleep daily   When it comes to diets, agreement about the perfect plan isnt easy to find, even among the experts. Experts at the [employer] developed an idea known as the Healthy Eating Plate. Just imagine a plate divided into logical, healthy portions.   The emphasis is on diet quality:   Load up on vegetables and fruits - one-half of your plate: Aim for color and variety, and remember that potatoes dont count.   Go for whole grains - one-quarter of your plate: Whole wheat, barley, wheat berries, quinoa, oats, brown rice, and foods made with them. If you want pasta, go with whole wheat pasta.   Protein power - one-quarter of your plate: Fish, chicken, beans, and nuts are all healthy, versatile protein sources. Limit red meat.   The diet, however, does go beyond the plate, offering a few other suggestions.   Use healthy plant oils, such as olive, canola, soy, corn, sunflower and peanut. Check the labels, and avoid partially hydrogenated oil, which have  unhealthy trans fats.   If youre thirsty, drink water. Coffee and tea are good in moderation, but skip sugary drinks and limit milk and dairy products to one or two daily servings.   The type of carbohydrate in the diet is more important than the amount. Some sources of carbohydrates, such as vegetables, fruits, whole grains, and beans-are healthier than others.   Finally, stay active  Signed, Sara Salines, DO  03/26/2020 10:58 AM    [place]

## 2020-03-27 ENCOUNTER — Other Ambulatory Visit: Payer: Self-pay | Admitting: Physician Assistant

## 2020-03-27 ENCOUNTER — Telehealth: Payer: Self-pay

## 2020-03-27 ENCOUNTER — Telehealth: Payer: Self-pay | Admitting: Cardiology

## 2020-03-27 ENCOUNTER — Other Ambulatory Visit: Payer: Self-pay | Admitting: Gastroenterology

## 2020-03-27 ENCOUNTER — Other Ambulatory Visit: Payer: Self-pay | Admitting: Family Medicine

## 2020-03-27 NOTE — Telephone Encounter (Signed)
Left message on patients voicemail to please return our call.   

## 2020-03-27 NOTE — Telephone Encounter (Signed)
Patient is calling to get results °

## 2020-03-27 NOTE — Telephone Encounter (Signed)
-----   Message from Berniece Salines, DO sent at 03/27/2020  8:03 AM EST ----- Creatinine at baseline.  Other labs stable.

## 2020-03-27 NOTE — Telephone Encounter (Signed)
Spoke with patient regarding results and recommendation.  Patient verbalizes understanding and is agreeable to plan of care. Advised patient to call back with any issues or concerns.  

## 2020-04-03 ENCOUNTER — Other Ambulatory Visit: Payer: Self-pay

## 2020-04-03 ENCOUNTER — Ambulatory Visit (INDEPENDENT_AMBULATORY_CARE_PROVIDER_SITE_OTHER): Payer: Medicare Other

## 2020-04-03 DIAGNOSIS — Z5181 Encounter for therapeutic drug level monitoring: Secondary | ICD-10-CM | POA: Diagnosis not present

## 2020-04-03 DIAGNOSIS — I480 Paroxysmal atrial fibrillation: Secondary | ICD-10-CM | POA: Diagnosis not present

## 2020-04-03 LAB — POCT INR: INR: 2.8 (ref 2.0–3.0)

## 2020-04-03 NOTE — Patient Instructions (Signed)
Continue taking 1/2 tablet daily except 1 tablet each Tuesday and Saturday  Stay consistent with greens/salads Recheck in 6 weeks. Call with new medications or any procedures  [phone].

## 2020-04-09 ENCOUNTER — Telehealth: Payer: Self-pay | Admitting: Cardiology

## 2020-04-09 DIAGNOSIS — E785 Hyperlipidemia, unspecified: Secondary | ICD-10-CM | POA: Diagnosis not present

## 2020-04-09 DIAGNOSIS — N184 Chronic kidney disease, stage 4 (severe): Secondary | ICD-10-CM | POA: Diagnosis not present

## 2020-04-09 DIAGNOSIS — D631 Anemia in chronic kidney disease: Secondary | ICD-10-CM | POA: Diagnosis not present

## 2020-04-09 DIAGNOSIS — I480 Paroxysmal atrial fibrillation: Secondary | ICD-10-CM | POA: Diagnosis not present

## 2020-04-09 DIAGNOSIS — E1122 Type 2 diabetes mellitus with diabetic chronic kidney disease: Secondary | ICD-10-CM | POA: Diagnosis not present

## 2020-04-09 DIAGNOSIS — I129 Hypertensive chronic kidney disease with stage 1 through stage 4 chronic kidney disease, or unspecified chronic kidney disease: Secondary | ICD-10-CM | POA: Diagnosis not present

## 2020-04-09 MED ORDER — METOLAZONE 5 MG PO TABS: 5.0000 mg | ORAL_TABLET | Freq: Every day | ORAL | 0 refills | Status: DC

## 2020-04-09 NOTE — Telephone Encounter (Signed)
Pt c/o swelling: STAT is pt has developed SOB within 24 hours  1) How much weight have you gained and in what time span? 7 lbs in 4 days   2) If swelling, where is the swelling located? Ankles and Legs   3) Are you currently taking a fluid pill? Yes   4) Are you currently SOB? Yes   5) Do you have a log of your daily weights (if so, list)?   04/09/20 178-179 lbs normally 172   6) Have you gained 3 pounds in a day or 5 pounds in a week? Yes    7) Have you traveled recently? Yes to [facility] 2 hrs away over the holiday   Sara Hancock is calling stating Dr. Harriet Masson advised her to call in if she had any swelling over the holiday so she can call her in a prescription. Please advise.

## 2020-04-09 NOTE — Telephone Encounter (Signed)
Please send her 5 mg of metolazone for 3 days, tell her to take this 30 minutes prior to her first dose of torsemide.  Have her come in for blood work next week.

## 2020-04-09 NOTE — Addendum Note (Signed)
Addended by: Resa Miner I on: 04/09/2020 02:09 PM   Modules accepted: Orders

## 2020-04-09 NOTE — Telephone Encounter (Signed)
Left message on patients voicemail to please return our call.   

## 2020-04-10 ENCOUNTER — Ambulatory Visit: Payer: Medicare Other | Admitting: Gastroenterology

## 2020-04-10 NOTE — Telephone Encounter (Signed)
Spoke to the patient just now and let her know Dr. Tobb's recommendations. She verbalizes understanding and thanks me for the call back.  

## 2020-04-10 NOTE — Telephone Encounter (Signed)
Patient returning Morgan's call, connected call to morgan  

## 2020-04-23 ENCOUNTER — Other Ambulatory Visit: Payer: Self-pay

## 2020-04-25 ENCOUNTER — Other Ambulatory Visit: Payer: Medicare Other

## 2020-04-25 ENCOUNTER — Other Ambulatory Visit: Payer: Self-pay

## 2020-04-25 DIAGNOSIS — N184 Chronic kidney disease, stage 4 (severe): Secondary | ICD-10-CM | POA: Diagnosis not present

## 2020-04-26 DIAGNOSIS — H33311 Horseshoe tear of retina without detachment, right eye: Secondary | ICD-10-CM | POA: Diagnosis not present

## 2020-04-26 DIAGNOSIS — H43813 Vitreous degeneration, bilateral: Secondary | ICD-10-CM | POA: Diagnosis not present

## 2020-04-26 DIAGNOSIS — H40003 Preglaucoma, unspecified, bilateral: Secondary | ICD-10-CM | POA: Diagnosis not present

## 2020-04-26 DIAGNOSIS — H25813 Combined forms of age-related cataract, bilateral: Secondary | ICD-10-CM | POA: Diagnosis not present

## 2020-04-26 DIAGNOSIS — E119 Type 2 diabetes mellitus without complications: Secondary | ICD-10-CM | POA: Diagnosis not present

## 2020-04-26 LAB — HM DIABETES EYE EXAM

## 2020-04-27 ENCOUNTER — Other Ambulatory Visit: Payer: Self-pay | Admitting: Family Medicine

## 2020-04-30 ENCOUNTER — Other Ambulatory Visit: Payer: Self-pay

## 2020-04-30 ENCOUNTER — Other Ambulatory Visit: Payer: Self-pay | Admitting: Family Medicine

## 2020-04-30 MED ORDER — ACCU-CHEK FASTCLIX LANCETS MISC
3 refills | Status: DC
Start: 2020-04-30 — End: 2020-12-11

## 2020-04-30 MED ORDER — METOPROLOL TARTRATE 100 MG PO TABS
ORAL_TABLET | ORAL | 3 refills | Status: DC
Start: 2020-04-30 — End: 2021-07-17

## 2020-04-30 MED ORDER — ACCU-CHEK FASTCLIX LANCETS MISC
3 refills | Status: DC
Start: 2020-04-30 — End: 2020-04-30

## 2020-05-03 ENCOUNTER — Ambulatory Visit: Payer: Medicare Other | Admitting: Family Medicine

## 2020-05-09 ENCOUNTER — Encounter: Payer: Self-pay | Admitting: Family Medicine

## 2020-05-09 ENCOUNTER — Ambulatory Visit (INDEPENDENT_AMBULATORY_CARE_PROVIDER_SITE_OTHER): Payer: Medicare Other | Admitting: Family Medicine

## 2020-05-09 ENCOUNTER — Other Ambulatory Visit: Payer: Self-pay

## 2020-05-09 VITALS — BP 122/74 | HR 113 | Temp 96.3°F | Ht 67.0 in | Wt 169.0 lb

## 2020-05-09 DIAGNOSIS — Z6826 Body mass index (BMI) 26.0-26.9, adult: Secondary | ICD-10-CM

## 2020-05-09 DIAGNOSIS — Z0000 Encounter for general adult medical examination without abnormal findings: Secondary | ICD-10-CM

## 2020-05-09 NOTE — Progress Notes (Signed)
Subjective:  Patient ID: Sara Hancock, female    DOB: 1934/08/23  Age: 85 y.o. MRN: [id number]  Chief Complaint  Patient presents with  . Annual Exam    HPI Encounter for general adult medical examination without abnormal findings  Physical ("At Risk" items are starred): Patient's last physical exam was 1 year ago .  Smoking: Life-long non-smoker ;  Physical Activity: Exercises at least 7 times per week ; stretching Eating healthy. Diabetic diet.  Alcohol/Drug Use: Is a non-drinker ; No illicit drug use ;  Patient is not afflicted from Stress Incontinence and Urge Incontinence  Safety: reviewed ; Patient wears a seat belt, has smoke detectors, has carbon monoxide detectors, and wears sunscreen with extended sun exposure. Dental Care: biannual cleanings, brushes and flosses daily. Ophthalmology/Optometry: Annual visit. Dr. Cordelia Pen 04/26/2020. Hearing loss: none Vision impairments: Glasses  DEXA- [id number]  Mammogram 05/03/2019  Fall Risk  05/09/2020 09/07/2019  Falls in the past year? 0 0  Number falls in past yr: 0 0  Injury with Fall? 0 0  Risk for fall due to : Impaired mobility No Fall Risks  Follow up Falls evaluation completed Falls evaluation completed;Falls prevention discussed     Depression screen [facility] 2/9 05/09/2020 09/07/2019  Decreased Interest 0 0  Down, Depressed, Hopeless 0 0  PHQ - 2 Score 0 0  Altered sleeping - 0  Tired, decreased energy - 0  Change in appetite - 0  Feeling bad or failure about yourself  - 0  Trouble concentrating - 0  Moving slowly or fidgety/restless - 0  Suicidal thoughts - 0  PHQ-9 Score - 0  Difficult doing work/chores - Not difficult at all  Some recent data might be hidden       Functional Status Survey: Is the patient deaf or have difficulty hearing?: No Does the patient have difficulty seeing, even when wearing glasses/contacts?: No Does the patient have difficulty concentrating, remembering, or making decisions?:  No Does the patient have difficulty walking or climbing stairs?: No Does the patient have difficulty dressing or bathing?: No Does the patient have difficulty doing errands alone such as visiting a doctor's office or shopping?: No   Social Hx   Social History   Socioeconomic History  . Marital status: Married    Spouse name: Not on file  . Number of children: 2  . Years of education: Not on file  . Highest education level: Not on file  Occupational History  . Occupation: retired    Fish farm manager: RETIRED  Tobacco Use  . Smoking status: Never Smoker  . Smokeless tobacco: Never Used  Vaping Use  . Vaping Use: Never used  Substance and Sexual Activity  . Alcohol use: No  . Drug use: No  . Sexual activity: Not Currently  Other Topics Concern  . Not on file  Social History Narrative  . Not on file   Social Determinants of Health   Financial Resource Strain: Not on file  Food Insecurity: Not on file  Transportation Needs: Not on file  Physical Activity: Not on file  Stress: Not on file  Social Connections: Not on file   Past Medical History:  Diagnosis Date  . Acquired thrombophilia ([place]) 09/11/2019  . Allergic rhinitis 09/17/2015  . Anemia   . ANEMIA, NORMOCYTIC 10/27/2007   Qualifier: Diagnosis of  By: Nils Pyle CMA (AAMA), Mearl Latin    . Anticoagulant long-term use 01/08/2011  . Arthritis   . Benign neoplasm of colon 01/08/2011  . Biliary  cirrhosis ([place])   . BRADYCARDIA 11/15/2008   Qualifier: Diagnosis of  By: Hollie Salk CMA, Estill Bamberg    . Cellulitis of left foot 09/25/2016  . Cholelithiasis 01/09/12  . Chronic kidney disease, stage 3 ([place])   . Combined form of age-related cataract, both eyes 02/26/2017  . Diabetes mellitus type 2 without retinopathy ([place]) 10/27/2013  . Diverticulosis of colon with hemorrhage 06/08/2000  . Dyslipidemia 09/17/2015  . Encounter for therapeutic drug monitoring 06/09/2013  . Esophageal reflux 01/08/2011  . Esophageal stenosis 01/08/11  . Esophagitis 01/08/11   . GALLSTONES 10/26/2008   Qualifier: Diagnosis of  By: Sharlett Iles MD Byrd Hesselbach Gastritis, erosive chronic 01/08/2011  . GERD (gastroesophageal reflux disease)   . Glaucoma suspect of both eyes 02/26/2017  . Hiatal hernia 01/08/11  . History of IBS 10/27/2007   Qualifier: Diagnosis of  By: Nils Pyle CMA ([place]), Mearl Latin    . HTN (hypertension)   . Hx of adenomatous colonic polyps 01/08/11  . Hyperplastic colon polyp 06/08/2000, 07/31/2003, 01/08/11  . Hypertension, essential 10/27/2007   Qualifier: Diagnosis of  By: Nils Pyle CMA (AAMA), Mearl Latin    . 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 Iron deficiency anemia 01/08/2011  . Left foot pain 09/25/2016  . Localized edema   . Long term (current) use of insulin ([place])   . Mixed hyperlipidemia   . Nuclear cataract 07/03/2011  . Obesity   . Obesity (BMI 30-39.9) 03/22/2012  . Osteoarthritis of right thumb 09/17/2015  . Osteopenia   . Other iron deficiency anemias   . Other specified menopausal and perimenopausal disorders   . PAF (paroxysmal atrial fibrillation) (H[facility])    occurring in the setting of bradycardia and mild first degree AV block followed by Dr. Jens Som  . Pain in thoracic spine   . Paraesophageal hiatal hernia, 3cm by EGD 03/22/2012  . Pedal edema 12/13/2019  . Persistent atrial fibrillation ([place]) 08/28/2019  . Posterior vitreous detachment, bilateral 01/10/2016  . 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 Pure hypercholesterolemia   . Retinal tear, right 07/03/2011  . Shortness of breath 12/13/2019  . Stage 3b chronic kidney disease ([place]) 09/11/2019  . Type II or unspecified type diabetes mellitus without mention of complication, not stated as uncontrolled   . Uterine fibroid 01/09/12   Family History  Problem Relation Age of Onset  . Heart attack Father    . Stroke Mother   . Diabetes Sister        x 2  . Aneurysm Sister   . Stroke Sister   . Congestive Heart Failure Sister   . Diabetes Brother   . Kidney failure Other   . Hyperlipidemia Other   . Arrhythmia Other   . Congestive Heart Failure Other   . Hypertension Other   . Arthritis Other   . Colon cancer Neg Hx   . Esophageal cancer Neg Hx   . Stomach cancer Neg Hx   . Rectal cancer Neg Hx     Review of Systems  Constitutional: Negative for chills, fatigue and fever.  HENT: Negative for congestion, ear pain and sore throat.   Respiratory: Negative for cough and shortness of breath.   Cardiovascular: Negative for chest pain.  Gastrointestinal: Negative for abdominal pain, constipation, diarrhea, nausea and vomiting.  Genitourinary: Negative for dysuria  and urgency.  Musculoskeletal: Negative for arthralgias and myalgias.  Skin: Negative for rash.  Neurological: Negative for dizziness and headaches.  Psychiatric/Behavioral: Negative for dysphoric mood. The patient is not nervous/anxious.    Objective:  BP 122/74   Pulse (!) 113   Temp (!) 96.3 F (35.7 C)   Ht 5\' 7"  (1.702 m)   Wt 169 lb (76.7 kg)   SpO2 97%   BMI 26.47 kg/m   BP/Weight 05/09/2020 03/26/2020 06/11/4386  Systolic BP 875 797 282  Diastolic BP 74 58 58  Wt. (Lbs) 169 173.4 172.2  BMI 26.47 27.16 31.5    Physical Exam Vitals reviewed.  Constitutional:      Appearance: Normal appearance.  Cardiovascular:     Rate and Rhythm: Normal rate and regular rhythm.     Heart sounds: Normal heart sounds.  Pulmonary:     Effort: Pulmonary effort is normal.     Breath sounds: Normal breath sounds.  Neurological:     Mental Status: She is alert and oriented to person, place, and time.  Psychiatric:        Mood and Affect: Mood normal.        Behavior: Behavior normal.     Lab Results  Component Value Date   WBC 6.5 02/27/2020   HGB 10.8 (L) 02/27/2020   HCT 32.0 (L) 02/27/2020   PLT 222  02/27/2020   GLUCOSE 179 (H) 03/26/2020   CHOL 132 02/27/2020   TRIG 87 02/27/2020   HDL 56 02/27/2020   LDLCALC 59 02/27/2020   ALT 19 02/27/2020   AST 25 02/27/2020   NA 141 03/26/2020   K 4.2 03/26/2020   CL 100 03/26/2020   CREATININE 2.03 (H) 03/26/2020   BUN 55 (H) 03/26/2020   CO2 26 03/26/2020   TSH 1.67 02/26/2017   INR 2.8 04/03/2020   HGBA1C 6.7 (H) 02/27/2020      Assessment & Plan:  1. General medical exam 2. BMI 26.0-26.9,adult Does not wish to discuss advance directives today.  Continue eating healthy and exercising.  Discussed fall prevention.      This is a list of the screening recommended for you and due dates:  Health Maintenance  Topic Date Due  . Mammogram  05/02/2020  . Hemoglobin A1C  08/27/2020  . Complete foot exam   09/06/2020  . Eye exam for diabetics  04/26/2021  . Tetanus Vaccine  03/31/2028  . Flu Shot  Completed  . DEXA scan (bone density measurement)  Completed  . COVID-19 Vaccine  Completed  . Pneumonia vaccines  Completed     AN INDIVIDUALIZED CARE PLAN: was established or reinforced today.   SELF MANAGEMENT: The patient and I together assessed ways to personally work towards obtaining the recommended goals  Support needs The patient and/or family needs were assessed and services were offered and not necessary at this time.    Follow-up: Return in about 1 year (around 05/09/2021) for AWV.  Rochel Brome, MD [facility] [phone]

## 2020-05-09 NOTE — Patient Instructions (Signed)
Preventive Care 38 Years and Older, Female Preventive care refers to lifestyle choices and visits with your health care provider that can promote health and wellness. This includes:  A yearly physical exam. This is also called an annual well check.  Regular dental and eye exams.  Immunizations.  Screening for certain conditions.  Healthy lifestyle choices, such as diet and exercise. What can I expect for my preventive care visit? Physical exam Your health care provider will check:  Height and weight. These may be used to calculate body mass index (BMI), which is a measurement that tells if you are at a healthy weight.  Heart rate and blood pressure.  Your skin for abnormal spots. Counseling Your health care provider may ask you questions about:  Alcohol, tobacco, and drug use.  Emotional well-being.  Home and relationship well-being.  Sexual activity.  Eating habits.  History of falls.  Memory and ability to understand (cognition).  Work and work Statistician.  Pregnancy and menstrual history. What immunizations do I need?  Influenza (flu) vaccine  This is recommended every year. Tetanus, diphtheria, and pertussis (Tdap) vaccine  You may need a Td booster every 10 years. Varicella (chickenpox) vaccine  You may need this vaccine if you have not already been vaccinated. Zoster (shingles) vaccine  You may need this after age 33. Pneumococcal conjugate (PCV13) vaccine  One dose is recommended after age 33. Pneumococcal polysaccharide (PPSV23) vaccine  One dose is recommended after age 72. Measles, mumps, and rubella (MMR) vaccine  You may need at least one dose of MMR if you were born in 1957 or later. You may also need a second dose. Meningococcal conjugate (MenACWY) vaccine  You may need this if you have certain conditions. Hepatitis A vaccine  You may need this if you have certain conditions or if you travel or work in places where you may be exposed  to hepatitis A. Hepatitis B vaccine  You may need this if you have certain conditions or if you travel or work in places where you may be exposed to hepatitis B. Haemophilus influenzae type b (Hib) vaccine  You may need this if you have certain conditions. You may receive vaccines as individual doses or as more than one vaccine together in one shot (combination vaccines). Talk with your health care provider about the risks and benefits of combination vaccines. What tests do I need? Blood tests  Lipid and cholesterol levels. These may be checked every 5 years, or more frequently depending on your overall health.  Hepatitis C test.  Hepatitis B test. Screening  Lung cancer screening. You may have this screening every year starting at age 39 if you have a 30-pack-year history of smoking and currently smoke or have quit within the past 15 years.  Colorectal cancer screening. All adults should have this screening starting at age 36 and continuing until age 15. Your health care provider may recommend screening at age 23 if you are at increased risk. You will have tests every 1-10 years, depending on your results and the type of screening test.  Diabetes screening. This is done by checking your blood sugar (glucose) after you have not eaten for a while (fasting). You may have this done every 1-3 years.  Mammogram. This may be done every 1-2 years. Talk with your health care provider about how often you should have regular mammograms.  BRCA-related cancer screening. This may be done if you have a family history of breast, ovarian, tubal, or peritoneal cancers.  Other tests  Sexually transmitted disease (STD) testing.  Bone density scan. This is done to screen for osteoporosis. You may have this done starting at age 44. Follow these instructions at home: Eating and drinking  Eat a diet that includes fresh fruits and vegetables, whole grains, lean protein, and low-fat dairy products. Limit  your intake of foods with high amounts of sugar, saturated fats, and salt.  Take vitamin and mineral supplements as recommended by your health care provider.  Do not drink alcohol if your health care provider tells you not to drink.  If you drink alcohol: ? Limit how much you have to 0-1 drink a day. ? Be aware of how much alcohol is in your drink. In the [place] one drink equals one 12 oz bottle of beer (355 mL), one 5 oz glass of wine (148 mL), or one 1 oz glass of hard liquor (44 mL). Lifestyle  Take daily care of your teeth and gums.  Stay active. Exercise for at least 30 minutes on 5 or more days each week.  Do not use any products that contain nicotine or tobacco, such as cigarettes, e-cigarettes, and chewing tobacco. If you need help quitting, ask your health care provider.  If you are sexually active, practice safe sex. Use a condom or other form of protection in order to prevent STIs (sexually transmitted infections).  Talk with your health care provider about taking a low-dose aspirin or statin. What's next?  Go to your health care provider once a year for a well check visit.  Ask your health care provider how often you should have your eyes and teeth checked.  Stay up to date on all vaccines. This information is not intended to replace advice given to you by your health care provider. Make sure you discuss any questions you have with your health care provider. Document Revised: 04/22/2018 Document Reviewed: 04/22/2018 Elsevier Patient Education  2020 [employer].

## 2020-05-10 ENCOUNTER — Ambulatory Visit: Payer: Medicare Other | Admitting: Family Medicine

## 2020-05-14 ENCOUNTER — Ambulatory Visit: Payer: Medicare Other | Admitting: Gastroenterology

## 2020-05-15 ENCOUNTER — Other Ambulatory Visit: Payer: Self-pay | Admitting: Family Medicine

## 2020-05-15 ENCOUNTER — Telehealth: Payer: Self-pay

## 2020-05-15 ENCOUNTER — Ambulatory Visit (INDEPENDENT_AMBULATORY_CARE_PROVIDER_SITE_OTHER): Payer: Medicare Other

## 2020-05-15 ENCOUNTER — Other Ambulatory Visit: Payer: Self-pay

## 2020-05-15 DIAGNOSIS — I480 Paroxysmal atrial fibrillation: Secondary | ICD-10-CM | POA: Diagnosis not present

## 2020-05-15 DIAGNOSIS — Z5181 Encounter for therapeutic drug level monitoring: Secondary | ICD-10-CM

## 2020-05-15 LAB — POCT INR: INR: 3 (ref 2.0–3.0)

## 2020-05-15 NOTE — Patient Instructions (Signed)
Continue taking 1/2 tablet daily except 1 tablet each Tuesday and Saturday  Recheck in 6 weeks. Call with new medications or any procedures  [phone].

## 2020-05-15 NOTE — Telephone Encounter (Signed)
Patient is due for RUQ abdominal ultrasound and repeat labs. Left message for patient to return my call.

## 2020-05-16 ENCOUNTER — Other Ambulatory Visit: Payer: Self-pay

## 2020-05-16 DIAGNOSIS — K743 Primary biliary cirrhosis: Secondary | ICD-10-CM

## 2020-05-16 NOTE — Telephone Encounter (Signed)
Patient states she would like to come in for labs tomorrow and ultrasound next week. Labs are in Epic for tomorrow and schedule her u/s on 05/21/20 at 10am. Informed patient of ultrasound date and time. Also, to be npo after midnight and arrive at 9:45am. Patient agreed and verbalized understanding.

## 2020-05-17 ENCOUNTER — Ambulatory Visit (INDEPENDENT_AMBULATORY_CARE_PROVIDER_SITE_OTHER): Payer: Medicare Other | Admitting: Gastroenterology

## 2020-05-17 ENCOUNTER — Ambulatory Visit: Payer: Medicare Other

## 2020-05-17 ENCOUNTER — Other Ambulatory Visit (INDEPENDENT_AMBULATORY_CARE_PROVIDER_SITE_OTHER): Payer: Medicare Other

## 2020-05-17 ENCOUNTER — Ambulatory Visit
Admission: RE | Admit: 2020-05-17 | Discharge: 2020-05-17 | Disposition: A | Discharge status: Home / Self Care | Payer: Medicare Other | Source: Ambulatory Visit | Attending: Family Medicine | Admitting: Family Medicine

## 2020-05-17 ENCOUNTER — Other Ambulatory Visit: Payer: Self-pay

## 2020-05-17 DIAGNOSIS — Z23 Encounter for immunization: Secondary | ICD-10-CM | POA: Diagnosis not present

## 2020-05-17 DIAGNOSIS — K743 Primary biliary cirrhosis: Secondary | ICD-10-CM

## 2020-05-17 DIAGNOSIS — Z1231 Encounter for screening mammogram for malignant neoplasm of breast: Secondary | ICD-10-CM | POA: Diagnosis not present

## 2020-05-17 DIAGNOSIS — H43813 Vitreous degeneration, bilateral: Secondary | ICD-10-CM | POA: Diagnosis not present

## 2020-05-17 DIAGNOSIS — H33311 Horseshoe tear of retina without detachment, right eye: Secondary | ICD-10-CM | POA: Diagnosis not present

## 2020-05-17 DIAGNOSIS — E119 Type 2 diabetes mellitus without complications: Secondary | ICD-10-CM | POA: Diagnosis not present

## 2020-05-17 DIAGNOSIS — H25813 Combined forms of age-related cataract, bilateral: Secondary | ICD-10-CM | POA: Diagnosis not present

## 2020-05-17 LAB — COMPREHENSIVE METABOLIC PANEL
ALT: 16 U/L (ref 0–35)
AST: 22 U/L (ref 0–37)
Albumin: 4.2 g/dL (ref 3.5–5.2)
Alkaline Phosphatase: 91 U/L (ref 39–117)
BUN: 56 mg/dL — ABNORMAL HIGH (ref 6–23)
CO2: 29 mEq/L (ref 19–32)
Calcium: 9.3 mg/dL (ref 8.4–10.5)
Chloride: 101 mEq/L (ref 96–112)
Creatinine, Ser: 1.81 mg/dL — ABNORMAL HIGH (ref 0.40–1.20)
GFR: 25.57 mL/min — ABNORMAL LOW (ref >60.00)
Glucose, Bld: 203 mg/dL — ABNORMAL HIGH (ref 70–99)
Potassium: 3.8 mEq/L (ref 3.5–5.1)
Sodium: 141 mEq/L (ref 135–145)
Total Bilirubin: 0.5 mg/dL (ref 0.2–1.2)
Total Protein: 7.3 g/dL (ref 6.0–8.3)

## 2020-05-17 LAB — CBC WITH DIFFERENTIAL/PLATELET
Basophils Absolute: 0 10*3/uL (ref 0.0–0.1)
Basophils Relative: 0.5 % (ref 0.0–3.0)
Eosinophils Absolute: 0 10*3/uL (ref 0.0–0.7)
Eosinophils Relative: 0.3 % (ref 0.0–5.0)
HCT: 32.4 % — ABNORMAL LOW (ref 36.0–46.0)
Hemoglobin: 10.8 g/dL — ABNORMAL LOW (ref 12.0–15.0)
Lymphocytes Relative: 13.8 % (ref 12.0–46.0)
Lymphs Abs: 0.9 10*3/uL (ref 0.7–4.0)
MCHC: 33.4 g/dL (ref 30.0–36.0)
MCV: 92.8 fl (ref 78.0–100.0)
Monocytes Absolute: 0.4 10*3/uL (ref 0.1–1.0)
Monocytes Relative: 6.7 % (ref 3.0–12.0)
Neutro Abs: 5.2 10*3/uL (ref 1.4–7.7)
Neutrophils Relative %: 78.7 % — ABNORMAL HIGH (ref 43.0–77.0)
Platelets: 222 10*3/uL (ref 150.0–400.0)
RBC: 3.49 Mil/uL — ABNORMAL LOW (ref 3.87–5.11)
RDW: 15.4 % (ref 11.5–15.5)
WBC: 6.6 10*3/uL (ref 4.0–10.5)

## 2020-05-17 LAB — PROTIME-INR
INR: 3.5 ratio — ABNORMAL HIGH (ref 0.8–1.0)
Prothrombin Time: 38.4 s — ABNORMAL HIGH (ref 9.6–13.1)

## 2020-05-18 ENCOUNTER — Telehealth: Payer: Self-pay

## 2020-05-18 LAB — AFP TUMOR MARKER: AFP-Tumor Marker: 1.5 ng/mL

## 2020-05-18 NOTE — Telephone Encounter (Signed)
I spoke to the patient in regards to her INR of 3.5 (goal 2-3) on 1/6, an increase from 3.0 on 1/4.    She said that she did not eat her normal Tuesday night helping of greens.  She was advised to eat them over the weekend and we will repeat INR on Tuesday, 1/11.  She verbalized understandig

## 2020-05-21 ENCOUNTER — Ambulatory Visit (HOSPITAL_COMMUNITY)
Admission: RE | Admit: 2020-05-21 | Discharge: 2020-05-21 | Disposition: A | Discharge status: Home / Self Care | Payer: Medicare Other | Source: Ambulatory Visit | Attending: Gastroenterology | Admitting: Gastroenterology

## 2020-05-21 ENCOUNTER — Other Ambulatory Visit: Payer: Self-pay

## 2020-05-21 DIAGNOSIS — K743 Primary biliary cirrhosis: Secondary | ICD-10-CM | POA: Diagnosis not present

## 2020-05-22 ENCOUNTER — Ambulatory Visit (INDEPENDENT_AMBULATORY_CARE_PROVIDER_SITE_OTHER): Payer: Medicare Other

## 2020-05-22 ENCOUNTER — Ambulatory Visit: Payer: Medicare Other

## 2020-05-22 DIAGNOSIS — I480 Paroxysmal atrial fibrillation: Secondary | ICD-10-CM | POA: Diagnosis not present

## 2020-05-22 DIAGNOSIS — Z5181 Encounter for therapeutic drug level monitoring: Secondary | ICD-10-CM

## 2020-05-22 LAB — POCT INR: INR: 3.5 — AB (ref 2.0–3.0)

## 2020-05-22 NOTE — Patient Instructions (Signed)
Hold today and then Continue taking 1/2 tablet daily except 1 tablet each Tuesday and Saturday  Recheck in 4 weeks. Call with new medications or any procedures  [phone].

## 2020-05-25 ENCOUNTER — Ambulatory Visit: Payer: Medicare Other | Admitting: Gastroenterology

## 2020-05-28 ENCOUNTER — Ambulatory Visit: Payer: Medicare Other | Admitting: Gastroenterology

## 2020-06-04 ENCOUNTER — Other Ambulatory Visit: Payer: Medicare Other

## 2020-06-04 ENCOUNTER — Other Ambulatory Visit: Payer: Self-pay

## 2020-06-04 ENCOUNTER — Other Ambulatory Visit: Payer: Self-pay | Admitting: Family Medicine

## 2020-06-04 DIAGNOSIS — I130 Hypertensive heart and chronic kidney disease with heart failure and stage 1 through stage 4 chronic kidney disease, or unspecified chronic kidney disease: Secondary | ICD-10-CM | POA: Diagnosis not present

## 2020-06-04 DIAGNOSIS — E1122 Type 2 diabetes mellitus with diabetic chronic kidney disease: Secondary | ICD-10-CM | POA: Diagnosis not present

## 2020-06-04 DIAGNOSIS — N184 Chronic kidney disease, stage 4 (severe): Secondary | ICD-10-CM | POA: Diagnosis not present

## 2020-06-05 LAB — COMPREHENSIVE METABOLIC PANEL
ALT: 18 IU/L (ref 0–32)
AST: 26 IU/L (ref 0–40)
Albumin/Globulin Ratio: 1.3 (ref 1.2–2.2)
Albumin: 3.9 g/dL (ref 3.6–4.6)
Alkaline Phosphatase: 115 IU/L (ref 44–121)
BUN/Creatinine Ratio: 26 (ref 12–28)
BUN: 49 mg/dL — ABNORMAL HIGH (ref 8–27)
Bilirubin Total: 0.3 mg/dL (ref 0.0–1.2)
CO2: 24 mmol/L (ref 20–29)
Calcium: 9 mg/dL (ref 8.7–10.3)
Chloride: 100 mmol/L (ref 96–106)
Creatinine, Ser: 1.86 mg/dL — ABNORMAL HIGH (ref 0.57–1.00)
GFR calc Af Amer: 28 mL/min/{1.73_m2} — ABNORMAL LOW (ref >59)
GFR calc non Af Amer: 25 mL/min/{1.73_m2} — ABNORMAL LOW (ref >59)
Globulin, Total: 3 g/dL (ref 1.5–4.5)
Glucose: 124 mg/dL — ABNORMAL HIGH (ref 65–99)
Potassium: 4.2 mmol/L (ref 3.5–5.2)
Sodium: 144 mmol/L (ref 134–144)
Total Protein: 6.9 g/dL (ref 6.0–8.5)

## 2020-06-05 LAB — CBC WITH DIFFERENTIAL/PLATELET
Basophils Absolute: 0 10*3/uL (ref 0.0–0.2)
Basos: 0 %
EOS (ABSOLUTE): 0 10*3/uL (ref 0.0–0.4)
Eos: 1 %
Hematocrit: 29.6 % — ABNORMAL LOW (ref 34.0–46.6)
Hemoglobin: 10.2 g/dL — ABNORMAL LOW (ref 11.1–15.9)
Immature Grans (Abs): 0 10*3/uL (ref 0.0–0.1)
Immature Granulocytes: 0 %
Lymphocytes Absolute: 1 10*3/uL (ref 0.7–3.1)
Lymphs: 16 %
MCH: 31.5 pg (ref 26.6–33.0)
MCHC: 34.5 g/dL (ref 31.5–35.7)
MCV: 91 fL (ref 79–97)
Monocytes Absolute: 0.6 10*3/uL (ref 0.1–0.9)
Monocytes: 9 %
Neutrophils Absolute: 4.8 10*3/uL (ref 1.4–7.0)
Neutrophils: 74 %
Platelets: 199 10*3/uL (ref 150–450)
RBC: 3.24 x10E6/uL — ABNORMAL LOW (ref 3.77–5.28)
RDW: 13.5 % (ref 11.7–15.4)
WBC: 6.5 10*3/uL (ref 3.4–10.8)

## 2020-06-05 LAB — LIPID PANEL
Chol/HDL Ratio: 2.4 ratio (ref 0.0–4.4)
Cholesterol, Total: 117 mg/dL (ref 100–199)
HDL: 48 mg/dL (ref >39)
LDL Chol Calc (NIH): 52 mg/dL (ref 0–99)
Triglycerides: 85 mg/dL (ref 0–149)
VLDL Cholesterol Cal: 17 mg/dL (ref 5–40)

## 2020-06-05 LAB — HEMOGLOBIN A1C
Est. average glucose Bld gHb Est-mCnc: 134 mg/dL
Hgb A1c MFr Bld: 6.3 % — ABNORMAL HIGH (ref 4.8–5.6)

## 2020-06-05 LAB — CARDIOVASCULAR RISK ASSESSMENT

## 2020-06-07 ENCOUNTER — Other Ambulatory Visit: Payer: Self-pay

## 2020-06-07 ENCOUNTER — Ambulatory Visit (INDEPENDENT_AMBULATORY_CARE_PROVIDER_SITE_OTHER): Payer: Medicare Other | Admitting: Family Medicine

## 2020-06-07 ENCOUNTER — Encounter: Payer: Self-pay | Admitting: Family Medicine

## 2020-06-07 VITALS — BP 104/70 | HR 88 | Temp 97.0°F | Resp 18 | Ht 67.0 in | Wt 166.0 lb

## 2020-06-07 DIAGNOSIS — D6869 Other thrombophilia: Secondary | ICD-10-CM

## 2020-06-07 DIAGNOSIS — N184 Chronic kidney disease, stage 4 (severe): Secondary | ICD-10-CM

## 2020-06-07 DIAGNOSIS — I130 Hypertensive heart and chronic kidney disease with heart failure and stage 1 through stage 4 chronic kidney disease, or unspecified chronic kidney disease: Secondary | ICD-10-CM | POA: Diagnosis not present

## 2020-06-07 DIAGNOSIS — I4819 Other persistent atrial fibrillation: Secondary | ICD-10-CM | POA: Diagnosis not present

## 2020-06-07 DIAGNOSIS — M810 Age-related osteoporosis without current pathological fracture: Secondary | ICD-10-CM

## 2020-06-07 DIAGNOSIS — K743 Primary biliary cirrhosis: Secondary | ICD-10-CM

## 2020-06-07 DIAGNOSIS — E782 Mixed hyperlipidemia: Secondary | ICD-10-CM | POA: Diagnosis not present

## 2020-06-07 DIAGNOSIS — E538 Deficiency of other specified B group vitamins: Secondary | ICD-10-CM | POA: Diagnosis not present

## 2020-06-07 DIAGNOSIS — E1122 Type 2 diabetes mellitus with diabetic chronic kidney disease: Secondary | ICD-10-CM | POA: Diagnosis not present

## 2020-06-07 NOTE — Progress Notes (Signed)
Subjective:  Patient ID: NHU GLASBY, female    DOB: 28-Aug-1934  Age: 86 y.o. MRN: [id number]  Chief Complaint  Patient presents with  . Diabetes  . Hypertension    HPI Diabetes complicated by retinopathy as well as chronic kidney disease stage IV: Currently on Levemir 32 units in a.m. and 18 units prior to supper, Janumet 50/1000 mg twice daily. Sugars range 100-140. Pt checks feet daily. Sees eye doctor annually.  Patient's A1c is 6.3  Hypertensive diastolic heart failure and chronic kidney disease stage IV: Currently on metoprolol tartrate, irbesartan 300 mg once daily, hydralazine 100 mg twice daily, torsemide 40 mg twice daily, metolazone 5 mg daily.  Patient sees cardiology (Dr. Harriet Masson.)  GFR is 25. Atrial fibrillation: Currently on Coumadin, metoprolol tartrate 100 mg twice daily.  Primary biliary cholangitis.  Patient sees Dr. Fuller Plan. Is currently maintained on ursodiol 300 mg one twice a day.  Folic acid 937 mcg daily GERD: Currently on Protonix 40 mg once daily. Hyperlipidemia: Currently on simvastatin 40 mg once daily.  Lipid panel reviewed with patient.  Is at goal. B12 deficiency: Currently taking B12 1000 mcg daily.  Patient has chronic anemia related to her kidney disease as well.  Her MCV is 91.  Current Outpatient Medications on File Prior to Visit  Medication Sig Dispense Refill  . ACCU-CHEK AVIVA PLUS test strip USE TO TEST TWICE DAILY 200 strip 3  . Accu-Chek FastClix Lancets MISC USE AS DIRECTED TO CHECK BLOOD SUGAR TWICE DAILY 204 each 3  . calcium-vitamin D (OSCAL WITH D) 250-125 MG-UNIT per tablet Take 1 tablet by mouth daily.    . Cholecalciferol (VITAMIN D3) 1000 units CAPS Take 1,000 Units by mouth daily.    . fexofenadine (ALLEGRA) 180 MG tablet Take 180 mg by mouth daily.     . fluticasone (FLONASE) 50 MCG/ACT nasal spray Place 1 spray into both nostrils as needed.     . folic acid (FOLVITE) 902 MCG tablet Take 400 mcg by mouth daily.    . hydrALAZINE  (APRESOLINE) 100 MG tablet TAKE 1 TABLET(100 MG) BY MOUTH FOUR TIMES DAILY (Patient taking differently: 2 (two) times daily.) 120 tablet 2  . Insulin Pen Needle (BD PEN NEEDLE NANO 2ND GEN) 32G X 4 MM MISC Use a new needle each time when checking FBS 200 each 3  . irbesartan (AVAPRO) 300 MG tablet TAKE 1 TABLET BY MOUTH DAILY 90 tablet 1  . JANUMET 50-1000 MG tablet TAKE 1 TABLET BY MOUTH TWICE DAILY WITH MEALS 180 tablet 1  . ketoconazole (NIZORAL) 2 % cream Apply 1 application topically daily as needed. Around skin folds for irritation    . LEVEMIR FLEXTOUCH 100 UNIT/ML FlexPen INJECT 32 UNITS UNDER THE SKIN EVERY MORNING AND 18 UNITS WITH SUPPER 45 mL 1  . metolazone (ZAROXOLYN) 5 MG tablet Take 1 tablet (5 mg total) by mouth daily. 3 tablet 0  . metoprolol tartrate (LOPRESSOR) 100 MG tablet TAKE 1 TABLET(100 MG) BY MOUTH TWICE DAILY 180 tablet 3  . Multiple Vitamin (MULTIVITAMIN WITH MINERALS) TABS Take 1 tablet by mouth daily.    . pantoprazole (PROTONIX) 40 MG tablet TAKE 1 TABLET BY MOUTH EVERY DAY 90 tablet 3  . simvastatin (ZOCOR) 40 MG tablet TAKE 1 TABLET BY MOUTH AT BEDTIME 90 tablet 1  . torsemide (DEMADEX) 20 MG tablet Take 2 tablets (40 mg total) by mouth 2 (two) times daily. 360 tablet 3  . ursodiol (ACTIGALL) 300 MG capsule TAKE 1  CAPSULE(300 MG) BY MOUTH TWICE DAILY 180 capsule 0  . vitamin B-12 (CYANOCOBALAMIN) 1000 MCG tablet Take 1,000 mcg by mouth daily.    . vitamin C (ASCORBIC ACID) 500 MG tablet Take 500 mg by mouth daily.    . vitamin E 400 UNIT capsule Take 400 Units by mouth daily.    Marland Kitchen warfarin (COUMADIN) 5 MG tablet TAKE AS DIRECTED BY COUMADIN CLINIC 90 tablet 1   No current facility-administered medications on file prior to visit.   Past Medical History:  Diagnosis Date  . Acquired thrombophilia ([place]) 09/11/2019  . Allergic rhinitis 09/17/2015  . Anemia   . ANEMIA, NORMOCYTIC 10/27/2007   Qualifier: Diagnosis of  By: Nils Pyle CMA (AAMA), Mearl Latin    . Anticoagulant  long-term use 01/08/2011  . Arthritis   . Benign neoplasm of colon 01/08/2011  . Biliary cirrhosis ([place])   . BRADYCARDIA 11/15/2008   Qualifier: Diagnosis of  By: Hollie Salk CMA, Estill Bamberg    . Cellulitis of left foot 09/25/2016  . Cholelithiasis 01/09/12  . Chronic kidney disease, stage 3 ([place])   . Combined form of age-related cataract, both eyes 02/26/2017  . Diabetes mellitus type 2 without retinopathy ([place]) 10/27/2013  . Diverticulosis of colon with hemorrhage 06/08/2000  . Dyslipidemia 09/17/2015  . Encounter for therapeutic drug monitoring 06/09/2013  . Esophageal reflux 01/08/2011  . Esophageal stenosis 01/08/11  . Esophagitis 01/08/11  . GALLSTONES 10/26/2008   Qualifier: Diagnosis of  By: Sharlett Iles MD Byrd Hesselbach Gastritis, erosive chronic 01/08/2011  . GERD (gastroesophageal reflux disease)   . Glaucoma suspect of both eyes 02/26/2017  . Hiatal hernia 01/08/11  . History of IBS 10/27/2007   Qualifier: Diagnosis of  By: Nils Pyle CMA ([place]), Mearl Latin    . HTN (hypertension)   . Hx of adenomatous colonic polyps 01/08/11  . Hyperplastic colon polyp 06/08/2000, 07/31/2003, 01/08/11  . Hypertension, essential 10/27/2007   Qualifier: Diagnosis of  By: Nils Pyle CMA (AAMA), Mearl Latin    . 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 Iron deficiency anemia 01/08/2011  . Left foot pain 09/25/2016  . Localized edema   . Long term (current) use of insulin ([place])   . Mixed hyperlipidemia   . Nuclear cataract 07/03/2011  . Obesity   . Obesity (BMI 30-39.9) 03/22/2012  . Osteoarthritis of right thumb 09/17/2015  . Osteopenia   . Other iron deficiency anemias   . Other specified menopausal and perimenopausal disorders   . PAF (paroxysmal atrial fibrillation) (HCC)    occurring in the setting of bradycardia and mild first degree AV block followed by Dr. Jens Som  . Pain in thoracic spine   .  Paraesophageal hiatal hernia, 3cm by EGD 03/22/2012  . Pedal edema 12/13/2019  . Persistent atrial fibrillation ([place]) 08/28/2019  . Posterior vitreous detachment, bilateral 01/10/2016  . Primary biliary cholangitis ([place]) 10/27/2007   Qualifier: Diagnosis of  By: Nils Pyle CMA (AAMA), Mearl Latin    . Pure hypercholesterolemia   . Retinal tear, right 07/03/2011  . Shortness of breath 12/13/2019  . Stage 3b chronic kidney disease ([place]) 09/11/2019  . Type II or unspecified type diabetes mellitus without mention of complication, not stated as uncontrolled   . Uterine fibroid 01/09/12   Past Surgical History:  Procedure Laterality Date  . BREAST CYST EXCISION     left  . CARDIOVERSION N/A 11/27/2017   Procedure: CARDIOVERSION;  Surgeon: Sanda Klein, MD;  Location: [facility];  Service: Cardiovascular;  Laterality: N/A;  . COLONOSCOPY    . INSERTION OF MESH  05/18/2012   Procedure: INSERTION OF MESH;  Surgeon: Adin Hector, MD;  Location: [facility];  Service: General;  Laterality: N/A;  . LIVER BIOPSY  05/18/2012   Procedure: LIVER BIOPSY;  Surgeon: Adin Hector, MD;  Location: [facility];  Service: General;;  . POLYPECTOMY    . THYROIDECTOMY, PARTIAL  1980  . TUBAL LIGATION  1972  . VENTRAL HERNIA REPAIR  05/18/2012   Procedure: LAPAROSCOPIC VENTRAL HERNIA;  Surgeon: Adin Hector, MD;  Location: [facility];  Service: General;  Laterality: N/A;  Laparoscopic Ventral Wall Hernia with Mesh    Family History  Problem Relation Age of Onset  . Heart attack Father   . Stroke Mother   . Diabetes Sister        x 2  . Aneurysm Sister   . Stroke Sister   . Congestive Heart Failure Sister   . Diabetes Brother   . Kidney failure Other   . Hyperlipidemia Other   . Arrhythmia Other   . Congestive Heart Failure Other   . Hypertension Other   . Arthritis Other   . Colon cancer Neg Hx   . Esophageal cancer Neg Hx   . Stomach cancer Neg Hx   . Rectal cancer Neg Hx    Social History   Socioeconomic History  .  Marital status: Married    Spouse name: Not on file  . Number of children: 2  . Years of education: Not on file  . Highest education level: Not on file  Occupational History  . Occupation: retired    Fish farm manager: RETIRED  Tobacco Use  . Smoking status: Never Smoker  . Smokeless tobacco: Never Used  Vaping Use  . Vaping Use: Never used  Substance and Sexual Activity  . Alcohol use: No  . Drug use: No  . Sexual activity: Not Currently  Other Topics Concern  . Not on file  Social History Narrative  . Not on file   Social Determinants of Health   Financial Resource Strain: Not on file  Food Insecurity: Not on file  Transportation Needs: Not on file  Physical Activity: Not on file  Stress: Not on file  Social Connections: Not on file    Review of Systems  Constitutional: Negative for chills, fatigue and fever.  HENT: Positive for congestion. Negative for rhinorrhea and sore throat.   Respiratory: Negative for cough and shortness of breath.   Cardiovascular: Negative for chest pain.  Gastrointestinal: Negative for abdominal pain, constipation, diarrhea, nausea and vomiting.  Genitourinary: Positive for frequency. Negative for dysuria and urgency.  Musculoskeletal: Positive for back pain. Negative for myalgias.  Neurological: Negative for dizziness, weakness, light-headedness and headaches.  Psychiatric/Behavioral: Negative for dysphoric mood. The patient is not nervous/anxious.      Objective:  BP 104/70   Pulse 88   Temp (!) 97 F (36.1 C)   Resp 18   Ht 5\' 7"  (1.702 m)   Wt 166 lb (75.3 kg)   BMI 26.00 kg/m   BP/Weight 06/07/2020 05/09/2020 56/43/3295  Systolic BP 188 416 606  Diastolic BP 70 74 58  Wt. (Lbs) 166 169 173.4  BMI 26 26.47 27.16    Physical Exam Vitals reviewed.  Constitutional:      Appearance: Normal appearance. She is normal weight.  Neck:     Vascular: No carotid  bruit.  Cardiovascular:     Rate and Rhythm: Normal rate and regular  rhythm.     Pulses: Normal pulses.     Heart sounds: Normal heart sounds.  Pulmonary:     Effort: Pulmonary effort is normal. No respiratory distress.     Breath sounds: Normal breath sounds.  Abdominal:     General: Abdomen is flat. Bowel sounds are normal.     Palpations: Abdomen is soft.     Tenderness: There is no abdominal tenderness.  Musculoskeletal:     Right lower leg: Edema present.     Left lower leg: Edema present.  Neurological:     Mental Status: She is alert and oriented to person, place, and time.  Psychiatric:        Mood and Affect: Mood normal.        Behavior: Behavior normal.     Diabetic Foot Exam - Simple   Simple Foot Form Diabetic Foot exam was performed with the following findings: Yes 06/07/2020  8:30 PM  Visual Inspection No deformities, no ulcerations, no other skin breakdown bilaterally: Yes Sensation Testing Intact to touch and monofilament testing bilaterally: Yes Pulse Check Posterior Tibialis and Dorsalis pulse intact bilaterally: Yes Comments      Lab Results  Component Value Date   WBC 6.5 06/04/2020   HGB 10.2 (L) 06/04/2020   HCT 29.6 (L) 06/04/2020   PLT 199 06/04/2020   GLUCOSE 124 (H) 06/04/2020   CHOL 117 06/04/2020   TRIG 85 06/04/2020   HDL 48 06/04/2020   LDLCALC 52 06/04/2020   ALT 18 06/04/2020   AST 26 06/04/2020   NA 144 06/04/2020   K 4.2 06/04/2020   CL 100 06/04/2020   CREATININE 1.86 (H) 06/04/2020   BUN 49 (H) 06/04/2020   CO2 24 06/04/2020   TSH 1.67 02/26/2017   INR 3.5 (A) 05/22/2020   HGBA1C 6.3 (H) 06/04/2020      Assessment & Plan:   1. CKD stage 4 due to type 2 diabetes mellitus ([place]) Refer to nephrology. I referred about 6 months ago, but I apparently did not release order.  CKD stable. - CCM pharmacy monitoring  2. Hypertensive heart and renal disease with renal failure, stage 1 through stage 4 or unspecified chronic kidney disease, with heart failure [facility]) Referral to nephrology.  Heart  disease managed by cardiology (Dr. Harriet Masson.) Continue current medications. - CCM pharmacy monitoring  3. B12 deficiency Continue B12 supplementation. - CCM pharmacy monitoring  4. Osteoporosis, post-menopausal Recommend calcium citrate with vitamin D 600 mg twice daily. Recommend Prolia. We will have my nurse call the patient back after saw the bone density after she departed the office. - CCM pharmacy monitoring  5. Acquired thrombophilia ([place]) Patient has acquired thrombophilia secondary to Coumadin. - CCM pharmacy monitoring  6. Primary biliary cholangitis (HCC) Follow-up with Dr. Fuller Plan. Continue current medications.  7. Mixed hyperlipidemia Continue current medications. Well-controlled.  8. Persistent atrial fibrillation ([place]) Management per cardiology. Continue current medications.   Orders Placed This Encounter  Procedures  . CCM pharmacy monitoring      Follow-up: Return in about 3 months (around 09/05/2020).  An After Visit Summary was printed and given to the patient.  Rochel Brome, MD [facility] [phone]

## 2020-06-13 ENCOUNTER — Encounter: Payer: Self-pay | Admitting: Family Medicine

## 2020-06-15 ENCOUNTER — Telehealth: Payer: Self-pay

## 2020-06-19 ENCOUNTER — Other Ambulatory Visit: Payer: Self-pay

## 2020-06-19 ENCOUNTER — Encounter: Payer: Self-pay | Admitting: Gastroenterology

## 2020-06-19 ENCOUNTER — Ambulatory Visit (INDEPENDENT_AMBULATORY_CARE_PROVIDER_SITE_OTHER): Payer: Medicare Other | Admitting: Gastroenterology

## 2020-06-19 VITALS — BP 122/74 | HR 102 | Ht 62.0 in | Wt 163.4 lb

## 2020-06-19 DIAGNOSIS — K743 Primary biliary cirrhosis: Secondary | ICD-10-CM

## 2020-06-19 DIAGNOSIS — K219 Gastro-esophageal reflux disease without esophagitis: Secondary | ICD-10-CM

## 2020-06-19 NOTE — Patient Instructions (Signed)
We will contact you in June to schedule your follow up ultrasound and lab work.  Thank you for choosing me and [facility].  Pricilla Riffle. Dagoberto Ligas., MD., Mar[facility] Regal

## 2020-06-19 NOTE — Progress Notes (Signed)
    History of Present Illness: This is an 86 year old female returning for follow-up of PBC.  She has CKD stae 4 and is followed by nephrologist.  Parke Simmers was added to her diuretic regimen for lower extremity edema and this has been effective in reducing her edema.  Her reflux symptoms are well controlled.  She has no gastrointestinal complaints.  Recent blood work and right upper quadrant ultrasound were stable.  Current Medications, Allergies, Past Medical History, Past Surgical History, Family History and Social History were reviewed in [employer] record.   Physical Exam: General: Well developed, well nourished, no acute distress Head: Normocephalic and atraumatic Eyes: Sclerae anicteric, EOMI Ears: Normal auditory acuity Mouth: Not examined, mask on during Covid-19 pandemic Lungs: Clear throughout to auscultation Heart: Regular rate and rhythm; no murmurs, rubs or bruits Abdomen: Soft, non tender and non distended. No masses, hepatosplenomegaly or hernias noted. Normal Bowel sounds Rectal: Not done Musculoskeletal: Symmetrical with no gross deformities  Pulses:  Normal pulses noted Extremities: No clubbing, cyanosis, edema or deformities noted Neurological: Alert oriented x 4, grossly nonfocal Psychological:  Alert and cooperative. Normal mood and affect   Assessment and Recommendations:  1. PBC, hepatic cirrhosis.  Continue 300 mg p.o. twice daily.  Continue routine [place] screening with blood work and RUQ [place] in 6 months. REV in 1 year.  2. GERD.  Follow antireflux measures and continue pantoprazole 40 mg daily.  3. Cholelithiasis, asymptomatic.  4.  Diastolic heart failure and CKD stage IV.  Followed by cardiology, nephrology and PCP.  5. Afib, maintained on warfarin.

## 2020-06-20 IMAGING — MG DIGITAL SCREENING BILATERAL MAMMOGRAM WITH TOMO AND CAD
6 of 10 series · 6 of 30 positions shown · non-contrast
Comparison: Previous exam(s).

CLINICAL DATA: Screening.

EXAM:
DIGITAL SCREENING BILATERAL MAMMOGRAM WITH TOMO AND CAD

[L CC synth-2D]
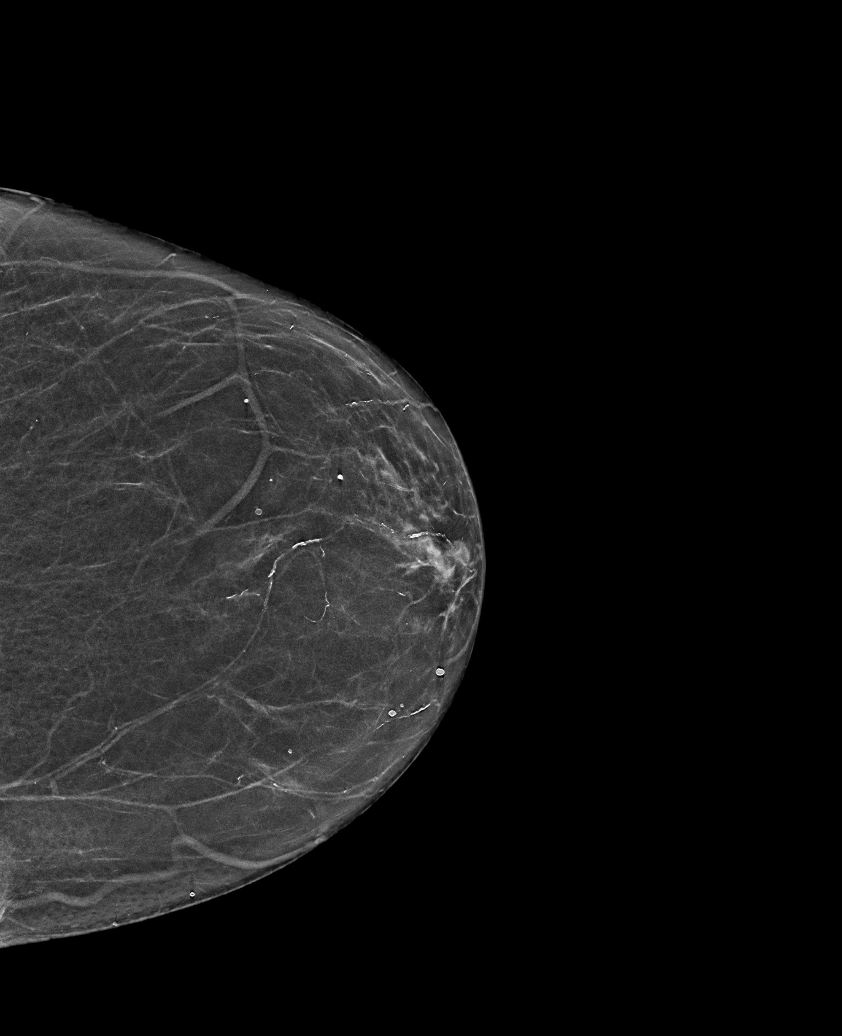

[R CC synth-2D]
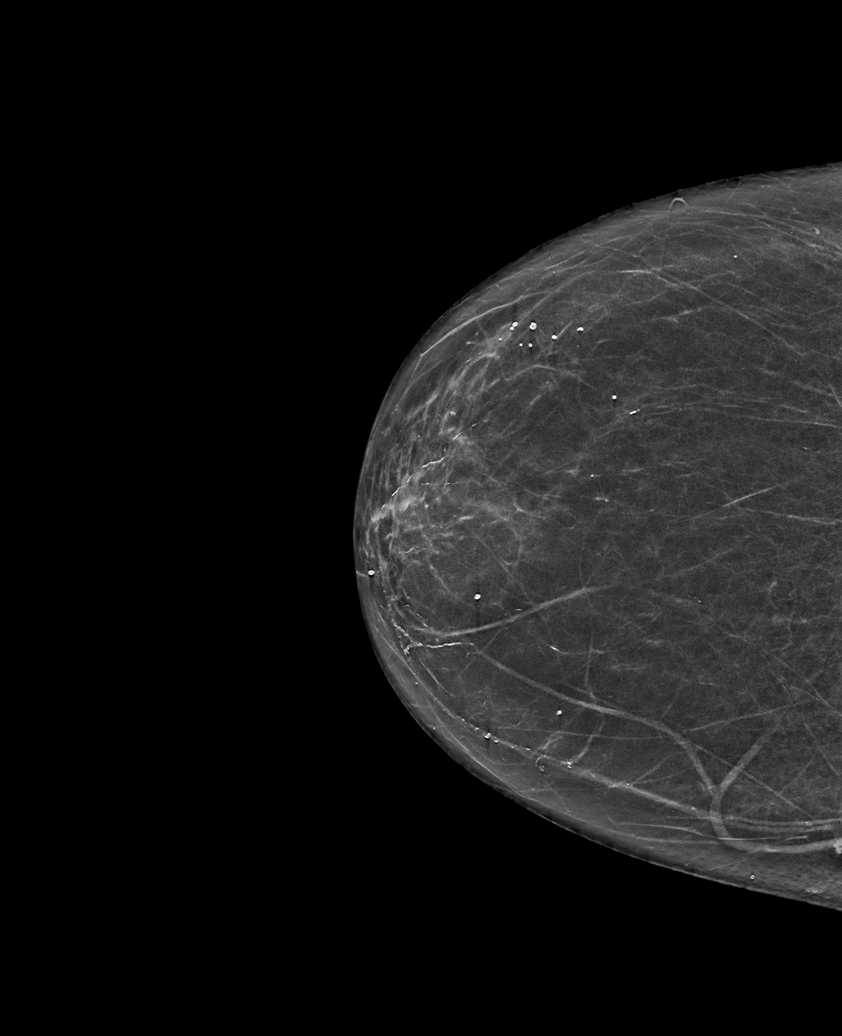

[L MLO synth-2D (1 of 2)]
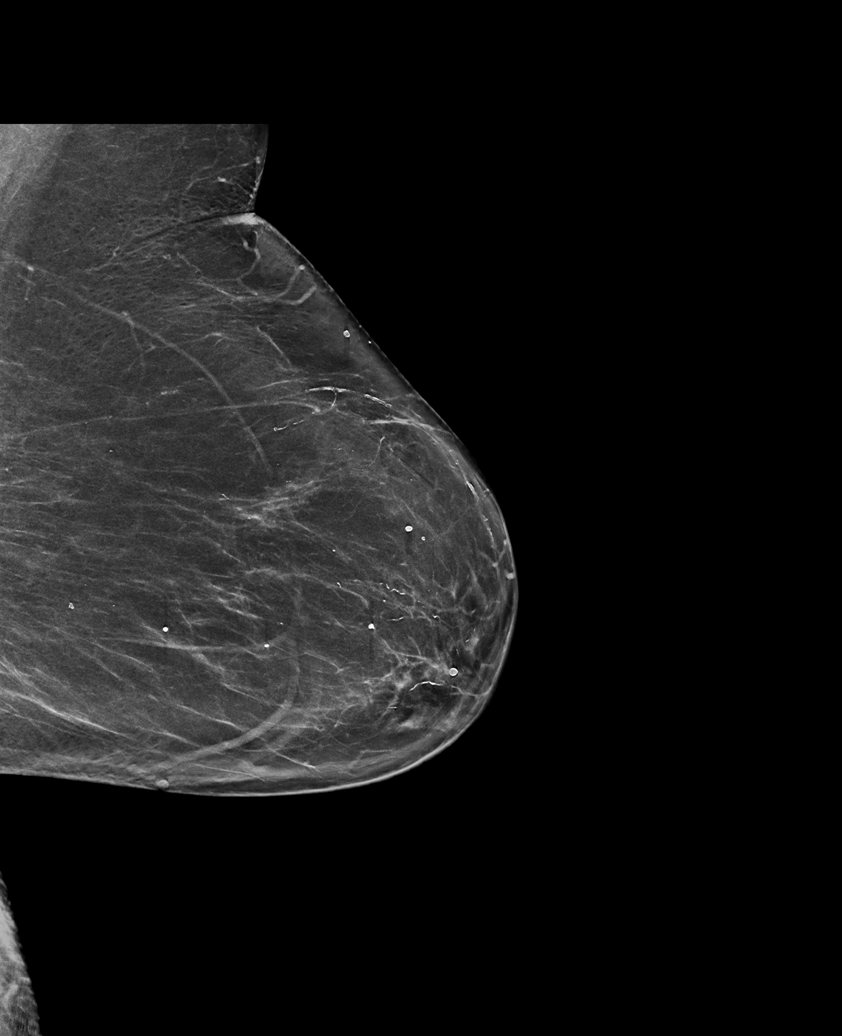

[R MLO synth-2D]
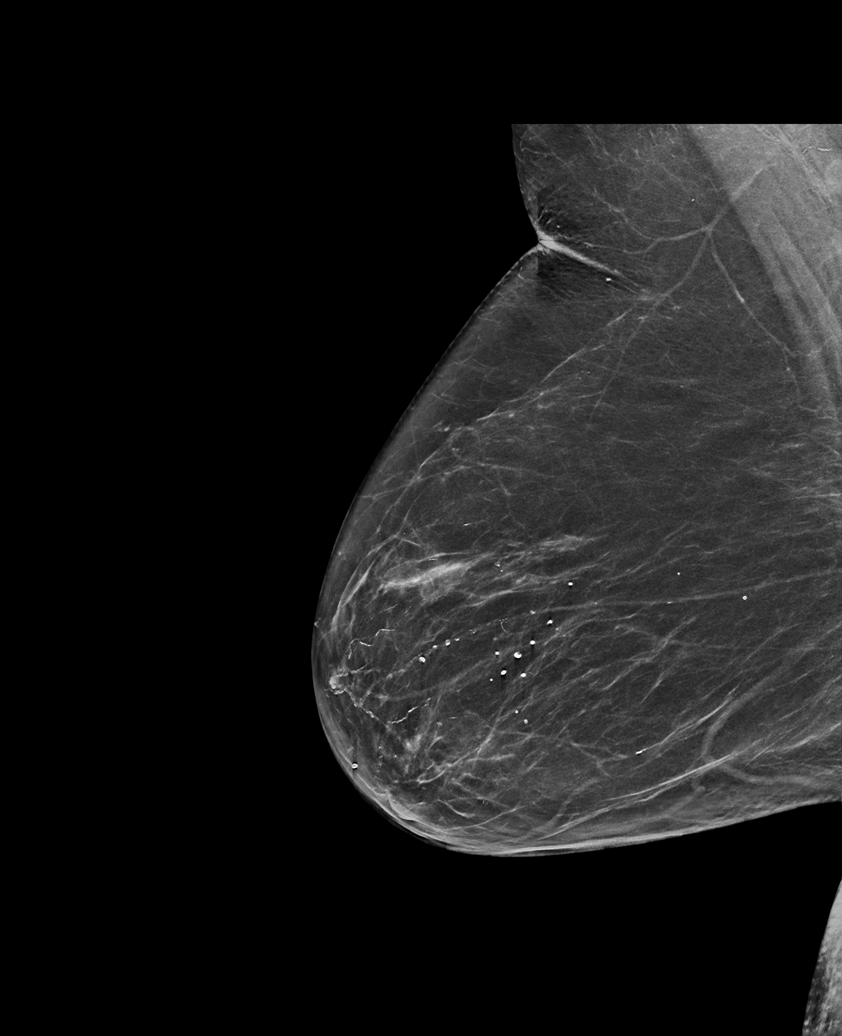

[L MLO synth-2D (2 of 2)]
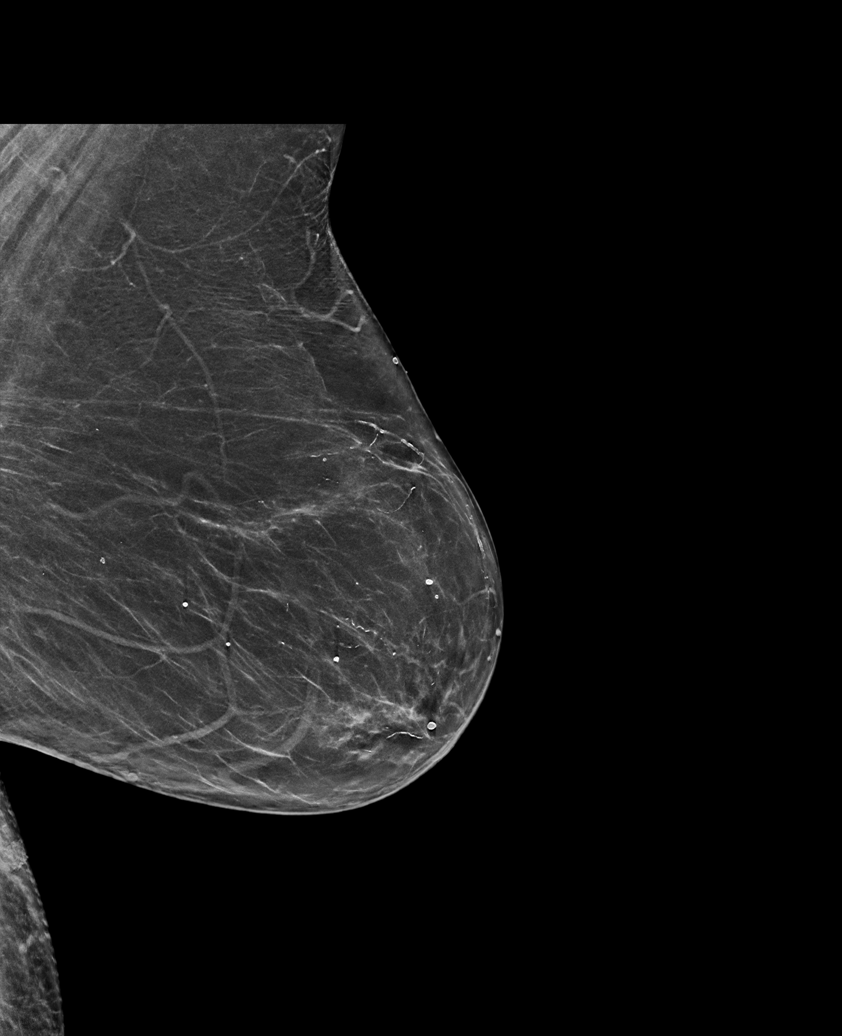

[R MLO tomo · tomo slice 37/74.0]
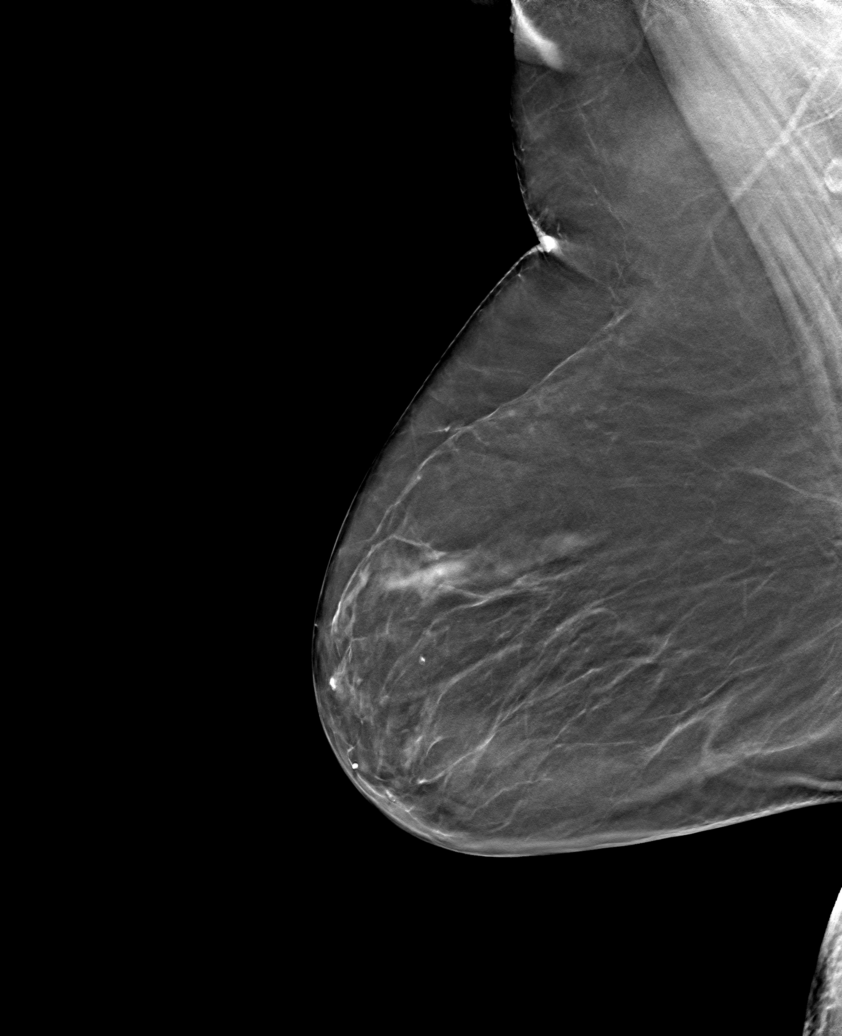

[6 of 30 positions shown; findings below may reference images not displayed]

ACR Breast Density Category b: There are scattered areas of
fibroglandular density.
FINDINGS: There are no findings suspicious for malignancy. Images were
processed with CAD.
IMPRESSION: No mammographic evidence of malignancy. A result letter of this
screening mammogram will be mailed directly to the patient.

RECOMMENDATION:
Screening mammogram in one year. (Code:[id number])

BI-RADS CATEGORY  1: Negative.

## 2020-06-21 ENCOUNTER — Other Ambulatory Visit: Payer: Self-pay | Admitting: Gastroenterology

## 2020-06-21 ENCOUNTER — Other Ambulatory Visit: Payer: Self-pay | Admitting: Family Medicine

## 2020-06-22 ENCOUNTER — Other Ambulatory Visit: Payer: Self-pay | Admitting: Family Medicine

## 2020-06-22 ENCOUNTER — Other Ambulatory Visit: Payer: Self-pay | Admitting: Nurse Practitioner

## 2020-06-25 ENCOUNTER — Other Ambulatory Visit: Payer: Self-pay

## 2020-06-25 DIAGNOSIS — I480 Paroxysmal atrial fibrillation: Secondary | ICD-10-CM | POA: Insufficient documentation

## 2020-06-26 ENCOUNTER — Ambulatory Visit (INDEPENDENT_AMBULATORY_CARE_PROVIDER_SITE_OTHER): Payer: Medicare Other | Admitting: Cardiology

## 2020-06-26 ENCOUNTER — Other Ambulatory Visit: Payer: Self-pay

## 2020-06-26 ENCOUNTER — Encounter: Payer: Self-pay | Admitting: Cardiology

## 2020-06-26 ENCOUNTER — Ambulatory Visit (INDEPENDENT_AMBULATORY_CARE_PROVIDER_SITE_OTHER): Payer: Medicare Other

## 2020-06-26 VITALS — BP 120/70 | HR 82 | Ht 62.0 in | Wt 166.0 lb

## 2020-06-26 DIAGNOSIS — I4819 Other persistent atrial fibrillation: Secondary | ICD-10-CM | POA: Diagnosis not present

## 2020-06-26 DIAGNOSIS — E782 Mixed hyperlipidemia: Secondary | ICD-10-CM

## 2020-06-26 DIAGNOSIS — I10 Essential (primary) hypertension: Secondary | ICD-10-CM | POA: Diagnosis not present

## 2020-06-26 DIAGNOSIS — Z5181 Encounter for therapeutic drug level monitoring: Secondary | ICD-10-CM

## 2020-06-26 DIAGNOSIS — I480 Paroxysmal atrial fibrillation: Secondary | ICD-10-CM

## 2020-06-26 DIAGNOSIS — N1832 Chronic kidney disease, stage 3b: Secondary | ICD-10-CM

## 2020-06-26 LAB — MAGNESIUM: Magnesium: 1.7 mg/dL (ref 1.6–2.3)

## 2020-06-26 LAB — BASIC METABOLIC PANEL
BUN/Creatinine Ratio: 33 — ABNORMAL HIGH (ref 12–28)
BUN: 60 mg/dL — ABNORMAL HIGH (ref 8–27)
CO2: 25 mmol/L (ref 20–29)
Calcium: 9.6 mg/dL (ref 8.7–10.3)
Chloride: 101 mmol/L (ref 96–106)
Creatinine, Ser: 1.82 mg/dL — ABNORMAL HIGH (ref 0.57–1.00)
GFR calc Af Amer: 29 mL/min/{1.73_m2} — ABNORMAL LOW (ref >59)
GFR calc non Af Amer: 25 mL/min/{1.73_m2} — ABNORMAL LOW (ref >59)
Glucose: 198 mg/dL — ABNORMAL HIGH (ref 65–99)
Potassium: 4.2 mmol/L (ref 3.5–5.2)
Sodium: 142 mmol/L (ref 134–144)

## 2020-06-26 LAB — POCT INR: INR: 3.4 — AB (ref 2.0–3.0)

## 2020-06-26 NOTE — Patient Instructions (Signed)
Medication Instructions:  Your physician recommends that you continue on your current medications as directed. Please refer to the Current Medication list given to you today.  *If you need a refill on your cardiac medications before your next appointment, please call your pharmacy*   Lab Work: Your physician recommends that you return for lab work: [place], [place]  If you have labs (blood work) drawn today and your tests are completely normal, you will receive your results only by: Marland Kitchen MyChart Message (if you have MyChart) OR . A paper copy in the mail If you have any lab test that is abnormal or we need to change your treatment, we will call you to review the results.   Testing/Procedures: None   Follow-Up: At [facility], you and your health needs are our priority.  As part of our continuing mission to provide you with exceptional heart care, we have created designated Provider Care Teams.  These Care Teams include your primary Cardiologist (physician) and Advanced Practice Providers (APPs -  Physician Assistants and Nurse Practitioners) who all work together to provide you with the care you need, when you need it.  We recommend signing up for the patient portal called "MyChart".  Sign up information is provided on this After Visit Summary.  MyChart is used to connect with patients for Virtual Visits (Telemedicine).  Patients are able to view lab/test results, encounter notes, upcoming appointments, etc.  Non-urgent messages can be sent to your provider as well.   To learn more about what you can do with MyChart, go to [email or web address].    Your next appointment:   3 month(s)  The format for your next appointment:   In Person  Provider:   Jenne Campus, MD or Berniece Salines, DO   Other Instructions

## 2020-06-26 NOTE — Addendum Note (Signed)
Addended by: Orvan July on: 06/26/2020 11:05 AM   Modules accepted: Orders

## 2020-06-26 NOTE — Patient Instructions (Signed)
Hold today and then Continue taking 1/2 tablet daily except 1 tablet each Tuesday and Saturday  Recheck in 4 weeks. Call with new medications or any procedures  [phone].

## 2020-06-26 NOTE — Progress Notes (Signed)
Cardiology Office Note:    Date:  06/26/2020   ID:  Sara Hancock, DOB [birth year], MRN [id number]  PCP:  Rochel Brome, MD  Cardiologist:  Berniece Salines, DO  Electrophysiologist:  None   Referring MD: Rochel Brome, MD   Chief Complaint  Patient presents with  . Follow-up   History of Present Illness:    Sara Hancock is a 86 y.o. female with a hx of atrial fibrillation on Coumadin and metoprolol, diastolic heart failure, hypertension presents today for follow-up visit.    I last saw the patient on March 26, 2020 at that time we kept her on her for some mild twice daily.  But in the interim I have given the patient some metolazone due to bilateral leg edema.  She is here today for follow-up visit. She has no complaints at this time.  Past Medical History:  Diagnosis Date  . Acquired thrombophilia ([place]) 09/11/2019  . Allergic rhinitis 09/17/2015  . Anemia   . ANEMIA, NORMOCYTIC 10/27/2007   Qualifier: Diagnosis of  By: Nils Pyle CMA (AAMA), Mearl Latin    . Anticoagulant long-term use 01/08/2011  . Arthritis   . Benign neoplasm of colon 01/08/2011  . Biliary cirrhosis ([place])   . BRADYCARDIA 11/15/2008   Qualifier: Diagnosis of  By: Hollie Salk CMA, Estill Bamberg    . Cellulitis of left foot 09/25/2016  . Cholelithiasis 01/09/12  . Chronic kidney disease, stage 3 ([place])   . Combined form of age-related cataract, both eyes 02/26/2017  . Diabetes mellitus type 2 without retinopathy ([place]) 10/27/2013  . Diverticulosis of colon with hemorrhage 06/08/2000  . Dyslipidemia 09/17/2015  . Encounter for therapeutic drug monitoring 06/09/2013  . Esophageal reflux 01/08/2011  . Esophageal stenosis 01/08/11  . Esophagitis 01/08/11  . GALLSTONES 10/26/2008   Qualifier: Diagnosis of  By: Sharlett Iles MD Byrd Hesselbach Gastritis, erosive chronic 01/08/2011  . GERD (gastroesophageal reflux disease)   . Glaucoma suspect of both eyes 02/26/2017  . Glaucoma suspect of both eyes 02/26/2017  . Hiatal hernia 01/08/11  .  History of IBS 10/27/2007   Qualifier: Diagnosis of  By: Nils Pyle CMA ([place]), Mearl Latin    . HTN (hypertension)   . Hx of adenomatous colonic polyps 01/08/11  . Hyperplastic colon polyp 06/08/2000, 07/31/2003, 01/08/11  . Hypertension, essential 10/27/2007   Qualifier: Diagnosis of  By: Nils Pyle CMA (AAMA), Mearl Latin    . 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 Iron deficiency anemia 01/08/2011  . Left foot pain 09/25/2016  . Localized edema   . Long term (current) use of insulin ([place])   . Mixed hyperlipidemia   . Nuclear cataract 07/03/2011  . Obesity   . Obesity (BMI 30-39.9) 03/22/2012  . Osteoarthritis of right thumb 09/17/2015  . Osteopenia   . Other iron deficiency anemias   . Other specified menopausal and perimenopausal disorders   . PAF (paroxysmal atrial fibrillation) (HCC)    occurring in the setting of bradycardia and mild first degree AV block followed by Dr. Jens Som  . Pain in thoracic spine   . Paraesophageal hiatal hernia, 3cm by EGD 03/22/2012  . Pedal edema 12/13/2019  . Persistent atrial fibrillation ([place]) 08/28/2019  . Posterior vitreous detachment, bilateral 01/10/2016  . 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 Pure hypercholesterolemia   . Retinal tear,  right 07/03/2011  . Shortness of breath 12/13/2019  . Stage 3b chronic kidney disease ([place]) 09/11/2019  . Type II or unspecified type diabetes mellitus without mention of complication, not stated as uncontrolled   . Uterine fibroid 01/09/12    Past Surgical History:  Procedure Laterality Date  . BREAST CYST EXCISION     left  . CARDIOVERSION N/A 11/27/2017   Procedure: CARDIOVERSION;  Surgeon: Sanda Klein, MD;  Location: [facility];  Service: Cardiovascular;  Laterality: N/A;  . COLONOSCOPY    . INSERTION OF MESH  05/18/2012   Procedure:  INSERTION OF MESH;  Surgeon: Adin Hector, MD;  Location: [facility];  Service: General;  Laterality: N/A;  . LIVER BIOPSY  05/18/2012   Procedure: LIVER BIOPSY;  Surgeon: Adin Hector, MD;  Location: [facility];  Service: General;;  . POLYPECTOMY    . THYROIDECTOMY, PARTIAL  1980  . TUBAL LIGATION  1972  . VENTRAL HERNIA REPAIR  05/18/2012   Procedure: LAPAROSCOPIC VENTRAL HERNIA;  Surgeon: Adin Hector, MD;  Location: [facility];  Service: General;  Laterality: N/A;  Laparoscopic Ventral Wall Hernia with Mesh    Current Medications: Current Meds  Medication Sig  . ACCU-CHEK AVIVA PLUS test strip USE TO TEST TWICE DAILY  . Accu-Chek FastClix Lancets MISC USE AS DIRECTED TO CHECK BLOOD SUGAR TWICE DAILY  . calcium-vitamin D (OSCAL WITH D) 250-125 MG-UNIT per tablet Take 1 tablet by mouth daily.  . Cholecalciferol (VITAMIN D3) 1000 units CAPS Take 1,000 Units by mouth daily.  . fexofenadine (ALLEGRA) 180 MG tablet Take 180 mg by mouth daily.  . fluticasone (FLONASE) 50 MCG/ACT nasal spray Place 1 spray into both nostrils as needed.   . folic acid (FOLVITE) 242 MCG tablet Take 400 mcg by mouth daily.  . hydrALAZINE (APRESOLINE) 100 MG tablet Take 100 mg by mouth 2 (two) times daily.  . Insulin Pen Needle (BD PEN NEEDLE NANO 2ND GEN) 32G X 4 MM MISC Use a new needle each time when checking FBS  . irbesartan (AVAPRO) 300 MG tablet TAKE 1 TABLET BY MOUTH DAILY  . JANUMET 50-1000 MG tablet TAKE 1 TABLET BY MOUTH TWICE DAILY WITH MEALS  . ketoconazole (NIZORAL) 2 % cream Apply 1 application topically daily as needed. Around skin folds for irritation  . LEVEMIR FLEXTOUCH 100 UNIT/ML FlexPen INJECT 32 UNITS UNDER THE SKIN EVERY MORNING AND 18 UNITS WITH SUPPER  . metolazone (ZAROXOLYN) 5 MG tablet Take 1 tablet (5 mg total) by mouth daily.  . metoprolol tartrate (LOPRESSOR) 100 MG tablet TAKE 1 TABLET(100 MG) BY MOUTH TWICE DAILY  . Multiple Vitamin (MULTIVITAMIN WITH MINERALS) TABS Take 1 tablet by  mouth daily.  . pantoprazole (PROTONIX) 40 MG tablet TAKE 1 TABLET BY MOUTH EVERY DAY  . simvastatin (ZOCOR) 40 MG tablet TAKE 1 TABLET BY MOUTH AT BEDTIME  . SITagliptin-metFORMIN HCl (JANUMET PO) Take 1 tablet by mouth daily.  Marland Kitchen spironolactone (ALDACTONE) 25 MG tablet Take 25 mg by mouth daily.  Marland Kitchen torsemide (DEMADEX) 20 MG tablet Take 2 tablets (40 mg total) by mouth 2 (two) times daily.  . ursodiol (ACTIGALL) 300 MG capsule TAKE 1 CAPSULE(300 MG) BY MOUTH TWICE DAILY  . vitamin B-12 (CYANOCOBALAMIN) 1000 MCG tablet Take 1,000 mcg by mouth daily.  . vitamin C (ASCORBIC ACID) 500 MG tablet Take 500 mg by mouth daily.  . vitamin E 400 UNIT capsule Take 400 Units by mouth daily.  Marland Kitchen warfarin (COUMADIN) 5 MG tablet TAKE  AS DIRECTED     Allergies:   Ace inhibitors, Capoten [captopril], and Neomycin-bacitracin zn-polymyx   Social History   Socioeconomic History  . Marital status: Married    Spouse name: Not on file  . Number of children: 2  . Years of education: Not on file  . Highest education level: Not on file  Occupational History  . Occupation: retired    Fish farm manager: RETIRED  Tobacco Use  . Smoking status: Never Smoker  . Smokeless tobacco: Never Used  Vaping Use  . Vaping Use: Never used  Substance and Sexual Activity  . Alcohol use: No  . Drug use: No  . Sexual activity: Not Currently  Other Topics Concern  . Not on file  Social History Narrative  . Not on file   Social Determinants of Health   Financial Resource Strain: Not on file  Food Insecurity: Not on file  Transportation Needs: Not on file  Physical Activity: Not on file  Stress: Not on file  Social Connections: Not on file     Family History: The patient's family history includes Aneurysm in her sister; Arrhythmia in an other family member; Arthritis in an other family member; Congestive Heart Failure in her sister and another family member; Diabetes in her brother and sister; Heart attack in her father;  Hyperlipidemia in an other family member; Hypertension in an other family member; Kidney failure in an other family member; Stroke in her mother and sister. There is no history of Colon cancer, Esophageal cancer, Stomach cancer, or Rectal cancer.  ROS:   Review of Systems  Constitution: Negative for decreased appetite, fever and weight gain.  HENT: Negative for congestion, ear discharge, hoarse voice and sore throat.   Eyes: Negative for discharge, redness, vision loss in right eye and visual halos.  Cardiovascular: Negative for chest pain, dyspnea on exertion, leg swelling, orthopnea and palpitations.  Respiratory: Negative for cough, hemoptysis, shortness of breath and snoring.   Endocrine: Negative for heat intolerance and polyphagia.  Hematologic/Lymphatic: Negative for bleeding problem. Does not bruise/bleed easily.  Skin: Negative for flushing, nail changes, rash and suspicious lesions.  Musculoskeletal: Negative for arthritis, joint pain, muscle cramps, myalgias, neck pain and stiffness.  Gastrointestinal: Negative for abdominal pain, bowel incontinence, diarrhea and excessive appetite.  Genitourinary: Negative for decreased libido, genital sores and incomplete emptying.  Neurological: Negative for brief paralysis, focal weakness, headaches and loss of balance.  Psychiatric/Behavioral: Negative for altered mental status, depression and suicidal ideas.  Allergic/Immunologic: Negative for HIV exposure and persistent infections.    EKGs/Labs/Other Studies Reviewed:    The following studies were reviewed today:   EKG: None today  Recent Labs: 01/09/2020: NT-Pro BNP 4,436 03/26/2020: Magnesium 1.7 06/04/2020: ALT 18; BUN 49; Creatinine, Ser 1.86; Hemoglobin 10.2; Platelets 199; Potassium 4.2; Sodium 144  Recent Lipid Panel    Component Value Date/Time   CHOL 117 06/04/2020 1147   TRIG 85 06/04/2020 1147   HDL 48 06/04/2020 1147   CHOLHDL 2.4 06/04/2020 1147   LDLCALC 52  06/04/2020 1147    Physical Exam:    VS:  BP 120/70 (BP Location: Left Arm, Patient Position: Sitting, Cuff Size: Normal)   Pulse 82   Ht 5\' 2"  (1.575 m)   Wt 166 lb (75.3 kg)   SpO2 98%   BMI 30.36 kg/m     Wt Readings from Last 3 Encounters:  06/26/20 166 lb (75.3 kg)  06/19/20 163 lb 6.4 oz (74.1 kg)  06/07/20 166 lb (75.3 kg)  GEN: Well nourished, well developed in no acute distress HEENT: Normal NECK: No JVD; No carotid bruits LYMPHATICS: No lymphadenopathy CARDIAC: S1S2 noted,RRR, no murmurs, rubs, gallops RESPIRATORY:  Clear to auscultation without rales, wheezing or rhonchi  ABDOMEN: Soft, non-tender, non-distended, +bowel sounds, no guarding. EXTREMITIES: Trace bilateral ankle edema, No cyanosis, no clubbing MUSCULOSKELETAL:  No deformity  SKIN: Warm and dry NEUROLOGIC:  Alert and oriented x 3, non-focal PSYCHIATRIC:  Normal affect, good insight  ASSESSMENT:    1. Hypertension, essential   2. Persistent atrial fibrillation (HCC)   3. Stage 3b chronic kidney disease ([place])   4. Mixed hyperlipidemia    PLAN:     She appears to be stable from a cardiovascular standpoint.  She is at her clinical baseline.    No changes will be made to her regimen for now.  What I like to do is get blood work to assess her kidney function as well as electrolytes.  Her blood pressure is at target. She will continue follow-up with our Coumadin clinic,, target INR is 2-3 for atrial fibrillation. The patient understands the need to lose weight with diet and exercise. We have discussed specific strategies for this. Hyperlipidemia - continue with current statin medication.  This is being managed by his primary care doctor.  No adjustments for antidiabetic medications were made today.   The patient is in ag 3 months or sooner if needed.  Reement with the above plan. The patient left the office in stable condition.  The patient will follow up in   Medication Adjustments/Labs and  Tests Ordered: Current medicines are reviewed at length with the patient today.  Concerns regarding medicines are outlined above.  No orders of the defined types were placed in this encounter.  No orders of the defined types were placed in this encounter.   There are no Patient Instructions on file for this visit.   Adopting a Healthy Lifestyle.  Know what a healthy weight is for you (roughly BMI <25) and aim to maintain this   Aim for 7+ servings of fruits and vegetables daily   65-80+ fluid ounces of water or unsweet tea for healthy kidneys   Limit to max 1 drink of alcohol per day; avoid smoking/tobacco   Limit animal fats in diet for cholesterol and heart health - choose grass fed whenever available   Avoid highly processed foods, and foods high in saturated/trans fats   Aim for low stress - take time to unwind and care for your mental health   Aim for 150 min of moderate intensity exercise weekly for heart health, and weights twice weekly for bone health   Aim for 7-9 hours of sleep daily   When it comes to diets, agreement about the perfect plan isnt easy to find, even among the experts. Experts at the [employer] developed an idea known as the Healthy Eating Plate. Just imagine a plate divided into logical, healthy portions.   The emphasis is on diet quality:   Load up on vegetables and fruits - one-half of your plate: Aim for color and variety, and remember that potatoes dont count.   Go for whole grains - one-quarter of your plate: Whole wheat, barley, wheat berries, quinoa, oats, brown rice, and foods made with them. If you want pasta, go with whole wheat pasta.   Protein power - one-quarter of your plate: Fish, chicken, beans, and nuts are all healthy, versatile protein sources. Limit red meat.   The  diet, however, does go beyond the plate, offering a few other suggestions.   Use healthy plant oils, such as olive, canola, soy, corn, sunflower  and peanut. Check the labels, and avoid partially hydrogenated oil, which have unhealthy trans fats.   If youre thirsty, drink water. Coffee and tea are good in moderation, but skip sugary drinks and limit milk and dairy products to one or two daily servings.   The type of carbohydrate in the diet is more important than the amount. Some sources of carbohydrates, such as vegetables, fruits, whole grains, and beans-are healthier than others.   Finally, stay active  Signed, Berniece Salines, DO  06/26/2020 10:59 AM    [place]

## 2020-06-27 ENCOUNTER — Telehealth: Payer: Self-pay | Admitting: Cardiology

## 2020-06-27 NOTE — Telephone Encounter (Signed)
Results reviewed with pt as per Dr. Tobb's note.  Pt verbalized understanding and had no additional questions. Routed to PCP 

## 2020-06-27 NOTE — Telephone Encounter (Signed)
Follow Up:     Pt is returning Shonda's call, concerning her lab results.

## 2020-07-16 ENCOUNTER — Other Ambulatory Visit: Payer: Medicare Other

## 2020-07-16 DIAGNOSIS — N184 Chronic kidney disease, stage 4 (severe): Secondary | ICD-10-CM | POA: Diagnosis not present

## 2020-07-17 LAB — CBC WITH DIFFERENTIAL/PLATELET
Basophils Absolute: 0 10*3/uL (ref 0.0–0.2)
Basos: 0 %
EOS (ABSOLUTE): 0 10*3/uL (ref 0.0–0.4)
Eos: 1 %
Hematocrit: 30.7 % — ABNORMAL LOW (ref 34.0–46.6)
Hemoglobin: 10.2 g/dL — ABNORMAL LOW (ref 11.1–15.9)
Immature Grans (Abs): 0 10*3/uL (ref 0.0–0.1)
Immature Granulocytes: 0 %
Lymphocytes Absolute: 0.9 10*3/uL (ref 0.7–3.1)
Lymphs: 13 %
MCH: 30.4 pg (ref 26.6–33.0)
MCHC: 33.2 g/dL (ref 31.5–35.7)
MCV: 92 fL (ref 79–97)
Monocytes Absolute: 0.6 10*3/uL (ref 0.1–0.9)
Monocytes: 9 %
Neutrophils Absolute: 5.4 10*3/uL (ref 1.4–7.0)
Neutrophils: 77 %
Platelets: 218 10*3/uL (ref 150–450)
RBC: 3.35 x10E6/uL — ABNORMAL LOW (ref 3.77–5.28)
RDW: 14.1 % (ref 11.7–15.4)
WBC: 7 10*3/uL (ref 3.4–10.8)

## 2020-07-17 LAB — RENAL FUNCTION PANEL
Albumin: 4 g/dL (ref 3.6–4.6)
BUN/Creatinine Ratio: 36 — ABNORMAL HIGH (ref 12–28)
BUN: 73 mg/dL — ABNORMAL HIGH (ref 8–27)
CO2: 24 mmol/L (ref 20–29)
Calcium: 9 mg/dL (ref 8.7–10.3)
Chloride: 101 mmol/L (ref 96–106)
Creatinine, Ser: 2.05 mg/dL — ABNORMAL HIGH (ref 0.57–1.00)
Glucose: 123 mg/dL — ABNORMAL HIGH (ref 65–99)
Phosphorus: 3.8 mg/dL (ref 3.0–4.3)
Potassium: 4.3 mmol/L (ref 3.5–5.2)
Sodium: 147 mmol/L — ABNORMAL HIGH (ref 134–144)
eGFR: 24 mL/min/{1.73_m2} — ABNORMAL LOW (ref >59)

## 2020-07-17 LAB — MICROALBUMIN / CREATININE URINE RATIO
Creatinine, Urine: 55.6 mg/dL
Microalb/Creat Ratio: 35 mg/g creat — ABNORMAL HIGH (ref 0–29)
Microalbumin, Urine: 19.2 ug/mL

## 2020-07-17 LAB — PARATHYROID HORMONE, INTACT (NO CA): PTH: 58 pg/mL (ref 15–65)

## 2020-07-20 DIAGNOSIS — H43813 Vitreous degeneration, bilateral: Secondary | ICD-10-CM | POA: Diagnosis not present

## 2020-07-20 DIAGNOSIS — H25813 Combined forms of age-related cataract, bilateral: Secondary | ICD-10-CM | POA: Diagnosis not present

## 2020-07-24 ENCOUNTER — Ambulatory Visit (INDEPENDENT_AMBULATORY_CARE_PROVIDER_SITE_OTHER): Payer: Medicare Other

## 2020-07-24 ENCOUNTER — Other Ambulatory Visit: Payer: Self-pay

## 2020-07-24 DIAGNOSIS — I480 Paroxysmal atrial fibrillation: Secondary | ICD-10-CM

## 2020-07-24 DIAGNOSIS — D631 Anemia in chronic kidney disease: Secondary | ICD-10-CM | POA: Diagnosis not present

## 2020-07-24 DIAGNOSIS — N184 Chronic kidney disease, stage 4 (severe): Secondary | ICD-10-CM | POA: Diagnosis not present

## 2020-07-24 DIAGNOSIS — I129 Hypertensive chronic kidney disease with stage 1 through stage 4 chronic kidney disease, or unspecified chronic kidney disease: Secondary | ICD-10-CM | POA: Diagnosis not present

## 2020-07-24 DIAGNOSIS — E1122 Type 2 diabetes mellitus with diabetic chronic kidney disease: Secondary | ICD-10-CM | POA: Diagnosis not present

## 2020-07-24 DIAGNOSIS — Z5181 Encounter for therapeutic drug level monitoring: Secondary | ICD-10-CM | POA: Diagnosis not present

## 2020-07-24 LAB — POCT INR: INR: 4.1 — AB (ref 2.0–3.0)

## 2020-07-24 NOTE — Patient Instructions (Signed)
Hold today and then decrease to 1/2 tablet daily.  Recheck in 3 weeks. Call with new medications or any procedures  [phone].

## 2020-08-14 ENCOUNTER — Ambulatory Visit (INDEPENDENT_AMBULATORY_CARE_PROVIDER_SITE_OTHER): Payer: Medicare Other

## 2020-08-14 ENCOUNTER — Other Ambulatory Visit: Payer: Self-pay

## 2020-08-14 DIAGNOSIS — I480 Paroxysmal atrial fibrillation: Secondary | ICD-10-CM

## 2020-08-14 DIAGNOSIS — Z5181 Encounter for therapeutic drug level monitoring: Secondary | ICD-10-CM | POA: Diagnosis not present

## 2020-08-14 LAB — POCT INR: INR: 2.7 (ref 2.0–3.0)

## 2020-08-14 NOTE — Patient Instructions (Signed)
Continue 1/2 tablet daily.  Recheck in 6 weeks. Call with new medications or any procedures  [phone].

## 2020-08-18 DIAGNOSIS — I4821 Permanent atrial fibrillation: Secondary | ICD-10-CM | POA: Insufficient documentation

## 2020-08-20 ENCOUNTER — Ambulatory Visit (INDEPENDENT_AMBULATORY_CARE_PROVIDER_SITE_OTHER): Payer: Medicare Other | Admitting: Internal Medicine

## 2020-08-20 ENCOUNTER — Encounter: Payer: Self-pay | Admitting: Internal Medicine

## 2020-08-20 ENCOUNTER — Telehealth: Payer: Self-pay | Admitting: Internal Medicine

## 2020-08-20 ENCOUNTER — Other Ambulatory Visit: Payer: Self-pay

## 2020-08-20 DIAGNOSIS — I4821 Permanent atrial fibrillation: Secondary | ICD-10-CM

## 2020-08-20 MED ORDER — APIXABAN 2.5 MG PO TABS: 2.5000 mg | ORAL_TABLET | Freq: Two times a day (BID) | ORAL | 1 refills | Status: DC

## 2020-08-20 MED ORDER — APIXABAN 2.5 MG PO TABS: 2.5000 mg | ORAL_TABLET | Freq: Two times a day (BID) | ORAL | 0 refills | Status: DC

## 2020-08-20 NOTE — Telephone Encounter (Signed)
*  STAT* If patient is at the pharmacy, call can be transferred to refill team.   1. Which medications need to be refilled? (please list name of each medication and dose if known) Eliquis  2. Which pharmacy/location (including street and city if local pharmacy) is medication to be sent to? [facility] [place], [place]  3. Do they need a 30 day or 90 day supply? 92  PT called stating she would like to start Eliquis that was being discussed at her appt today.

## 2020-08-20 NOTE — Telephone Encounter (Signed)
Pt qualifies for Eliquis 2.5mg  BID for afib indication since age is > 80 and SCr is > 1.5. She is currently on warfarin, last dose taken yesterday. Called pt and advised her to stop warfarin. She will start Eliquis 2.5mg  BID with first dose tomorrow morning. Reviewed differences between Eliquis and warfarin with pt, also canceled upcoming INR appt. Sent in 30 day free card for pt, she has checked with her insurance to ensure that follow up copays are affordable. 3 month supply for follow up rx per pt request has also been sent in.

## 2020-08-20 NOTE — Patient Instructions (Signed)
Medication Instructions:  Your physician recommends that you continue on your current medications as directed. Please refer to the Current Medication list given to you today.  Please check to see what the coverage is for Eliquis and Xarelto is with your insurance. *If you need a refill on your cardiac medications before your next appointment, please call your pharmacy*   Lab Work: None ordered.  If you have labs (blood work) drawn today and your tests are completely normal, you will receive your results only by: Marland Kitchen MyChart Message (if you have MyChart) OR . A paper copy in the mail If you have any lab test that is abnormal or we need to change your treatment, we will call you to review the results.   Testing/Procedures: None ordered.    Follow-Up: At [facility], you and your health needs are our priority.  As part of our continuing mission to provide you with exceptional heart care, we have created designated Provider Care Teams.  These Care Teams include your primary Cardiologist (physician) and Advanced Practice Providers (APPs -  Physician Assistants and Nurse Practitioners) who all work together to provide you with the care you need, when you need it.  We recommend signing up for the patient portal called "MyChart".  Sign up information is provided on this After Visit Summary.  MyChart is used to connect with patients for Virtual Visits (Telemedicine).  Patients are able to view lab/test results, encounter notes, upcoming appointments, etc.  Non-urgent messages can be sent to your provider as well.   To learn more about what you can do with MyChart, go to [email or web address].    Your next appointment:   Follow up with Dr Caryl Comes as needed.

## 2020-08-20 NOTE — Progress Notes (Signed)
Patient Care Team: Rochel Brome, MD as PCP - General (Family Medicine) Berniece Salines, DO as PCP - Cardiology (Cardiology) [name], MD as Consulting Physician (Gastroenterology) [name], MD as Consulting Physician (Cardiology) Ladene Artist, MD as Consulting Physician (Gastroenterology)   HPI  Sara Hancock is a 86 y.o. female seen in followup for atrial fibrillation that has become permanent.. It occurs in the setting of background bradycardia and mild first-degree AV block her propafenone was stopped 8/19 after reverting to atrial fibrillation without a change in symptoms    work breathing is doing better.  Not sure why.  Followed by nephrology.  No longer on potassium supplementation.  No edema.  No chest pain.    On warfarin for anticoagulation; NO Bleeding      Date Cr K Hgb  10/18   10.4   4/19   10.4  12/20 1.34 4.1 11.9  3/22 2.05       Past Medical History:  Diagnosis Date  . Acquired thrombophilia ([place]) 09/11/2019  . Allergic rhinitis 09/17/2015  . Anemia   . ANEMIA, NORMOCYTIC 10/27/2007   Qualifier: Diagnosis of  By: Nils Pyle CMA (AAMA), Mearl Latin    . Anticoagulant long-term use 01/08/2011  . Arthritis   . Benign neoplasm of colon 01/08/2011  . Biliary cirrhosis ([place])   . BRADYCARDIA 11/15/2008   Qualifier: Diagnosis of  By: Hollie Salk CMA, Estill Bamberg    . Cellulitis of left foot 09/25/2016  . Cholelithiasis 01/09/12  . Chronic kidney disease, stage 3 ([place])   . Combined form of age-related cataract, both eyes 02/26/2017  . Diabetes mellitus type 2 without retinopathy ([place]) 10/27/2013  . Diverticulosis of colon with hemorrhage 06/08/2000  . Dyslipidemia 09/17/2015  . Encounter for therapeutic drug monitoring 06/09/2013  . Esophageal reflux 01/08/2011  . Esophageal stenosis 01/08/11  . Esophagitis 01/08/11  . GALLSTONES 10/26/2008   Qualifier: Diagnosis of  By: Sharlett Iles MD Byrd Hesselbach Gastritis, erosive chronic 01/08/2011  . GERD (gastroesophageal  reflux disease)   . Glaucoma suspect of both eyes 02/26/2017  . Glaucoma suspect of both eyes 02/26/2017  . Hiatal hernia 01/08/11  . History of IBS 10/27/2007   Qualifier: Diagnosis of  By: Nils Pyle CMA ([place]), Mearl Latin    . HTN (hypertension)   . Hx of adenomatous colonic polyps 01/08/11  . Hyperplastic colon polyp 06/08/2000, 07/31/2003, 01/08/11  . Hypertension, essential 10/27/2007   Qualifier: Diagnosis of  By: Nils Pyle CMA (AAMA), Mearl Latin    . 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 Iron deficiency anemia 01/08/2011  . Left foot pain 09/25/2016  . Localized edema   . Long term (current) use of insulin ([place])   . Mixed hyperlipidemia   . Nuclear cataract 07/03/2011  . Obesity   . Obesity (BMI 30-39.9) 03/22/2012  . Osteoarthritis of right thumb 09/17/2015  . Osteopenia   . Other iron deficiency anemias   . Other specified menopausal and perimenopausal disorders   . PAF (paroxysmal atrial fibrillation) ([facility])    occurring in the setting of bradycardia and mild first degree AV block followed by Dr. Jens Som  . Pain in thoracic spine   . Paraesophageal hiatal hernia, 3cm by EGD 03/22/2012  . Pedal edema 12/13/2019  . Persistent atrial fibrillation ([place]) 08/28/2019  . Posterior vitreous detachment, bilateral 01/10/2016  . 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 Pure hypercholesterolemia   . Retinal tear, right 07/03/2011  . Shortness of breath 12/13/2019  . Stage 3b chronic kidney disease ([place]) 09/11/2019  . Type II or unspecified type diabetes mellitus without mention of complication, not stated as uncontrolled   . Uterine fibroid 01/09/12    Past Surgical History:  Procedure Laterality Date  . BREAST CYST EXCISION     left  . CARDIOVERSION N/A 11/27/2017   Procedure: CARDIOVERSION;  Surgeon: Sanda Noni Stonesifer, MD;   Location: [facility];  Service: Cardiovascular;  Laterality: N/A;  . COLONOSCOPY    . INSERTION OF MESH  05/18/2012   Procedure: INSERTION OF MESH;  Surgeon: Adin Hector, MD;  Location: [facility];  Service: General;  Laterality: N/A;  . LIVER BIOPSY  05/18/2012   Procedure: LIVER BIOPSY;  Surgeon: Adin Hector, MD;  Location: [facility];  Service: General;;  . POLYPECTOMY    . THYROIDECTOMY, PARTIAL  1980  . TUBAL LIGATION  1972  . VENTRAL HERNIA REPAIR  05/18/2012   Procedure: LAPAROSCOPIC VENTRAL HERNIA;  Surgeon: Adin Hector, MD;  Location: [facility];  Service: General;  Laterality: N/A;  Laparoscopic Ventral Wall Hernia with Mesh    Current Outpatient Medications  Medication Sig Dispense Refill  . ACCU-CHEK AVIVA PLUS test strip USE TO TEST TWICE DAILY 200 strip 3  . Accu-Chek FastClix Lancets MISC USE AS DIRECTED TO CHECK BLOOD SUGAR TWICE DAILY 204 each 3  . calcium-vitamin D (OSCAL WITH D) 250-125 MG-UNIT per tablet Take 1 tablet by mouth daily.    . Cholecalciferol (VITAMIN D3) 1000 units CAPS Take 1,000 Units by mouth daily.    . fexofenadine (ALLEGRA) 180 MG tablet Take 180 mg by mouth daily.    . fluticasone (FLONASE) 50 MCG/ACT nasal spray Place 1 spray into both nostrils as needed.     . folic acid (FOLVITE) 086 MCG tablet Take 400 mcg by mouth daily.    . hydrALAZINE (APRESOLINE) 100 MG tablet Take 100 mg by mouth 2 (two) times daily.    . Insulin Pen Needle (BD PEN NEEDLE NANO 2ND GEN) 32G X 4 MM MISC Use a new needle each time when checking FBS 200 each 3  . irbesartan (AVAPRO) 300 MG tablet TAKE 1 TABLET BY MOUTH DAILY 90 tablet 1  . JANUMET 50-1000 MG tablet TAKE 1 TABLET BY MOUTH TWICE DAILY WITH MEALS 180 tablet 1  . ketoconazole (NIZORAL) 2 % cream Apply 1 application topically daily as needed. Around skin folds for irritation    . LEVEMIR FLEXTOUCH 100 UNIT/ML FlexPen INJECT 32 UNITS UNDER THE SKIN EVERY MORNING AND 18 UNITS WITH SUPPER 45 mL 1  . metoprolol tartrate  (LOPRESSOR) 100 MG tablet TAKE 1 TABLET(100 MG) BY MOUTH TWICE DAILY 180 tablet 3  . Multiple Vitamin (MULTIVITAMIN WITH MINERALS) TABS Take 1 tablet by mouth daily.    . pantoprazole (PROTONIX) 40 MG tablet TAKE 1 TABLET BY MOUTH EVERY DAY 90 tablet 3  . simvastatin (ZOCOR) 40 MG tablet TAKE 1 TABLET BY MOUTH AT BEDTIME 90 tablet 1  . spironolactone (ALDACTONE) 25 MG tablet Take 25 mg by mouth daily.    Marland Kitchen torsemide (DEMADEX) 20 MG tablet Take 2 tablets (40 mg total) by mouth 2 (two) times daily. 360 tablet 3  . ursodiol (ACTIGALL) 300 MG capsule TAKE 1 CAPSULE(300 MG) BY MOUTH TWICE DAILY 180 capsule 3  . vitamin B-12 (CYANOCOBALAMIN) 1000 MCG tablet  Take 1,000 mcg by mouth daily.    . vitamin C (ASCORBIC ACID) 500 MG tablet Take 500 mg by mouth daily.    . vitamin E 400 UNIT capsule Take 400 Units by mouth daily.    Marland Kitchen warfarin (COUMADIN) 5 MG tablet TAKE AS DIRECTED 90 tablet 1   No current facility-administered medications for this visit.    Allergies  Allergen Reactions  . Ace Inhibitors Anaphylaxis  . Capoten [Captopril] Anaphylaxis    Throat swells shut  . Neomycin-Bacitracin Zn-Polymyx Rash    Review of Systems negative except from HPI and PMH  Physical Exam BP 120/80 (BP Location: Left Arm, Patient Position: Sitting, Cuff Size: Normal)   Ht 5\' 2"  (1.575 m)   Wt 166 lb (75.3 kg)   SpO2 96%   BMI 30.36 kg/m  Well developed and nourished in no acute distress HENT normal Neck supple   Clear Irregularly irregular rate and rhythm with controlled  ventricular response, no murmurs or gallops Abd-soft   No Clubbing cyanosis edema Skin-warm and dry A & Oriented  Grossly normal sensory and motor function  ECG: AF  @87             Intervals  -/08/37  Axis 16 18      Assessment and  Plan  Atrial fibrillation-permanent   Hypertension  Abnormal ECG  Anemia- chronic   Permanent atrial fibrillation no attributable symptoms. Anticoagulated with warfarin.  Data  suggests that the risk benefit for warfarin worsens as GFR falls to much greater degree that is true with apixaban.  We will have her look into the cost.  Blood pressure well controlled  Following closely with Dr. Harriet Masson  We will see as needed

## 2020-08-21 ENCOUNTER — Ambulatory Visit (INDEPENDENT_AMBULATORY_CARE_PROVIDER_SITE_OTHER): Payer: Medicare Other | Admitting: Nurse Practitioner

## 2020-08-21 ENCOUNTER — Encounter: Payer: Self-pay | Admitting: Nurse Practitioner

## 2020-08-21 ENCOUNTER — Encounter: Payer: Self-pay | Admitting: Family Medicine

## 2020-08-21 VITALS — BP 122/76 | HR 60 | Temp 97.3°F | Resp 16 | Ht 62.0 in | Wt 166.0 lb

## 2020-08-21 DIAGNOSIS — N184 Chronic kidney disease, stage 4 (severe): Secondary | ICD-10-CM | POA: Diagnosis not present

## 2020-08-21 DIAGNOSIS — I131 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, with stage 1 through stage 4 chronic kidney disease, or unspecified chronic kidney disease: Secondary | ICD-10-CM | POA: Diagnosis not present

## 2020-08-21 DIAGNOSIS — D631 Anemia in chronic kidney disease: Secondary | ICD-10-CM | POA: Diagnosis not present

## 2020-08-21 DIAGNOSIS — E1169 Type 2 diabetes mellitus with other specified complication: Secondary | ICD-10-CM

## 2020-08-21 DIAGNOSIS — I4821 Permanent atrial fibrillation: Secondary | ICD-10-CM | POA: Diagnosis not present

## 2020-08-21 DIAGNOSIS — H2589 Other age-related cataract: Secondary | ICD-10-CM

## 2020-08-21 DIAGNOSIS — E782 Mixed hyperlipidemia: Secondary | ICD-10-CM | POA: Diagnosis not present

## 2020-08-21 DIAGNOSIS — Z01818 Encounter for other preprocedural examination: Secondary | ICD-10-CM | POA: Diagnosis not present

## 2020-08-21 LAB — POCT URINALYSIS DIPSTICK
Bilirubin, UA: NEGATIVE
Blood, UA: NEGATIVE
Glucose, UA: NEGATIVE
Ketones, UA: NEGATIVE
Leukocytes, UA: NEGATIVE
Nitrite, UA: NEGATIVE
Protein, UA: POSITIVE — AB
Spec Grav, UA: 1.015 (ref 1.010–1.025)
Urobilinogen, UA: 0.2 E.U./dL
pH, UA: 6 (ref 5.0–8.0)

## 2020-08-21 NOTE — Patient Instructions (Signed)
Hold diabetes medications morning of surgeries  Cataract Surgery Cataract surgery is a procedure to remove a cloudy lens (cataract) in the eye. The lens focuses light inside the eye. When a lens becomes cloudy, your vision is affected. Another lens (intraocular lens or IOL) is usually inserted to replace the cloudy lens and to properly focus light in the eye. Cataract surgery is usually recommended when the cataract is causing problems with daily functioning, such as reading, watching television, or driving. Tell a health care provider about:  Any allergies you have.  All medicines you are taking, including vitamins, herbs, eye drops, creams, and over-the-counter medicines.  Any problems you or family members have had with anesthetic medicines.  Any blood disorders you have.  Any surgeries you have had, especially eye surgeries that include refractive surgery, such as PRK and LASIK.  Any medical conditions you have.  Whether you are pregnant or may be pregnant. What are the risks? Generally, this is a safe procedure. However, problems may occur, including:  Infection.  Bleeding.  High pressure in the eye (glaucoma).  Detachment of the retina.  Corneal swelling.  Allergic reactions to medicines.  Damage to other structures or organs.  Inflammation of the eye.  Clouding of the part of your eye that holds an IOL in place (after-cataract). This is common and can be treated at a later date with laser surgery.  An IOL moving out of position (rare).  Loss of vision (rare). What happens before the procedure? Staying hydrated Follow instructions from your health care provider about hydration, which may include:  Up to 2 to 6 hours before the procedure - you may continue to drink clear liquids, such as water, clear fruit juice, black coffee, and plain tea. Exact instructions will be given by your health care provider.   Eating and drinking restrictions Follow instructions from  your health care provider about eating and drinking, which may include:  8 hours before the procedure - stop eating heavy meals or foods, such as meat, fried foods, or fatty foods.  6-8 hours before the procedure - stop eating light meals or foods, such as toast or cereal.  6-8 hours before the procedure - stop drinking milk or drinks that contain milk.  2-6 hours before the procedure - stop drinking clear liquids. Medicines Ask your health care provider about:  Changing or stopping your regular medicines. This is especially important if you are taking diabetes medicines or blood thinners.  Taking medicines such as aspirin and ibuprofen. These medicines can thin your blood. Do not take these medicines unless your health care provider tells you to take them.  Taking over-the-counter medicines, vitamins, herbs, and supplements. General instructions  Do not put contact lenses in either eye on the day of your surgery.  Plan to have someone take you home from the hospital or clinic.  If you will be going home right after the procedure, plan to have someone with you for 24 hours.  Ask your health care provider what steps will be taken to help prevent infection. These may include: ? Washing skin with a germ-killing soap. ? Taking antibiotic medicine. What happens during the procedure?  An IV may be inserted into one of your veins.  You will be given one or both of the following: ? A medicine to help you relax (sedative). ? A medicine to numb the area (local anesthetic). This may be numbing eye drops or an injection that is given behind the eye.  A  small cut (incision) will be made to the edge of the clear surface that covers the front of the eye (cornea).  A small probe will be inserted into the eye. This device gives off ultrasound waves that soften and break up the cloudy center of the lens. This makes it easier for the cataract to be removed.  The cataract will be removed by  suction.  An IOL may be implanted.  Part of the capsule that surrounds the lens will be left in the eye to support the IOL.  Your surgeon may use stitches (sutures) to close the incision. The procedure may vary among health care providers and hospitals.      What happens after the procedure?  Your blood pressure, heart rate, breathing rate, and blood oxygen level will be monitored until you leave the hospital or clinic.  You may be given a protective shield to wear over your eye.  You may be given medicines to relieve discomfort and swelling. Some medicines may be in the form of eye drops.  Do not drive for 24 hours if you were given a sedative during your procedure. Summary  Cataract surgery is a procedure to remove a cloudy lens (cataract) in the eye.  This is a safe procedure. However, problems may occur, including infection, bleeding, glaucoma, inflammation, and loss of vision.  Follow your health care provider's instructions about when to stop eating and drinking and whether to change or stop certain medicines.  After the procedure, you may be given medicines or an eye shield to wear over your eye.  Do not drive for 24 hours if you were given a sedative during your procedure. This information is not intended to replace advice given to you by your health care provider. Make sure you discuss any questions you have with your health care provider. Document Revised: 02/22/2019 Document Reviewed: 10/26/2017 Elsevier Patient Education  2021 [place]. Cataract  A cataract is a buildup of protein that causes the lens of your eye to become cloudy. The lens is normally clear. It is the part of the eye that is behind your iris and pupil. The lens focuses light on the retina, which lets you see clearly. When a lens becomes cloudy, your vision may become blurry. The clouding can range from a tiny dot to complete cloudiness. As some cataracts develop, they can make it harder for you to  see things that are far away. (You become more nearsighted.) Other cataracts increase glare. Cataracts can worsen over time, and sometimes the pupil can look white. As cataracts get worse, they cloud more of the lens, making it difficult to see. Cataracts can affect one eye or both eyes. What are the causes? This condition may be caused by age-related eye changes. The lens of the eye is mostly made up of water and protein. Normally, this protein is arranged in a way that keeps the lens clear. Cataracts develop when protein begins to clump together over time. This buildup of protein clouds the lens and lets less light pass through to the retina, which causes blurry vision. What increases the risk? You are more likely to develop this condition if you:  Are 31 years of age or older.  Have diabetes.  Have high blood pressure.  Take certain medicines, such as steroids or hormone replacement therapy.  Have had an eye injury.  Have or have had eye inflammation.  Have a family history of cataracts.  Smoke.  Drink alcohol heavily.  Are frequently  exposed to sun or very strong light without eye protection.  Are obese.  Have been exposed to large amounts of radiation, lead, or other toxic substances.  Have had eye surgery. What are the signs or symptoms? The main symptom of a cataract is blurry vision. Your vision may change or get worse over time. Other symptoms include:  Increased glare.  Seeing a bright ring or halo around light.  Poor night vision.  Double or "shadow" vision in one eye or both eyes.  Having trouble seeing, even while wearing contact lenses or glasses.  Seeing colors that appear faded.  Having trouble telling the difference between blue and purple.  Needing frequent changes to your prescription glasses or contacts. How is this diagnosed? This condition is diagnosed with a medical history and eye exam.  You should see an eye specialist (optometrist or  ophthalmologist).  Your health care provider may enlarge (dilate) your pupils with eye drops to see the back of your eye more clearly and look for signs of cataracts or other eye damage. You may also have tests, including:  A visual acuity test. This uses a chart to determine the smallest letters that you can see from a specific distance.  A slit-lamp exam. This uses a microscope to examine small sections of your eye for abnormalities.  Tonometry. This test measures the pressure of the fluid inside your eye.  Glare testing. This test shines a light in your eye while you view letters to see whether the bright light affects your vision. How is this treated? Treatment depends on the stage of your cataract. You may:  Wear eyeglasses or use stronger light. This is for an early-stage cataract.  Have surgery if the condition is severely affecting your vision. This is needed for late-stage cataract.  Stop or change certain medicines. This is recommended if your health care provider thinks your cataract may be linked to your medicines. Follow these instructions at home: Lifestyle  Use stronger or brighter lighting.  Consider using a magnifying glass for reading or other activities.  Become familiar with your surroundings. Having poor vision can put you at greater risk for tripping, falling, or bumping into things.  Wear sunglasses and a hat if you are sensitive to bright light or are having problems with glare.  Do not use any products that contain nicotine or tobacco, such as cigarettes, e-cigarettes, and chewing tobacco. If you need help quitting, ask your health care provider. General instructions  If you are prescribed new eyeglasses, wear them as told by your health care provider.  Take over-the-counter and prescription medicines only as told by your health care provider. Do not change your medicines unless told by your health care provider.  Do not drive or use heavy machinery if  your vision is blurry, particularly at night.  Keep your blood sugar under control if you have diabetes.  Keep all follow-up visits as told by your health care provider. This is important. Contact a health care provider if:  Your symptoms get worse.  Your vision affects your ability to perform daily activities.  You have new symptoms.  You have a fever. Get help right away if:  You have sudden vision loss.  You have redness, swelling, or increasing pain in your eye.  You develop a headache and sensitivity to light. Summary  A cataract is a buildup of protein that causes the lens of your eye to become cloudy. Cataracts are very common, especially as people age.  Mild  cataracts cause mild visual symptoms, while more severe cataracts can cause a significant decrease in quality of life.  Mild cataracts can often be treated with a prescription for new glasses or contact lenses, while surgery is often recommended for more severe cataracts.  Contact a health care provider if your symptoms get worse, your vision affects your ability to do daily activities, or you have a fever.  Get help right away if you have sudden vision loss, redness, swelling, or increasing pain in the eye, or you develop a headache or sensitivity to light. This information is not intended to replace advice given to you by your health care provider. Make sure you discuss any questions you have with your health care provider. Document Revised: 10/26/2017 Document Reviewed: 10/26/2017 Elsevier Patient Education  [place] Before and After Surgery Your health care providers, including your surgical team, take many precautions to keep you safe during surgery. This sheet explains the steps that your health care providers take to prevent surgical error and to reduce the risk of complications. It also lists some things that you, your friends, and your family members can do to ensure a safe surgery. What is  surgical error? Surgical error is when a mistake is made during a procedure. Types of surgical error include:  Operating on the wrong person.  Operating on the wrong body part.  Performing the wrong operation.  Making a medicine mistake.  Making a surgical mistake. What is a complication? A complication is a change in the normal surgical plan of care. Complications are known risks that, while rare, can occur during or after surgery. Examples of complications include:  Infection.  Excess bleeding.  Inhaling food or liquids from your stomach into your lungs (aspiration).  Allergic reaction to medicines. Review all risks and complications with your health care provider as part of the informed consent procedure before your surgery. Ask questions if you do not understand something. What steps are taken to prevent surgical error and complications? Your health care providers, including your surgical team, take many steps before and during a procedure to reduce the risk of error and complication. These steps include:  Placing an identification bracelet on your wrist and checking it often.  Checking the surgical consent form to make sure it is correct.  Reviewing the surgical consent form with you to make sure that you understand the procedure.  Reviewing your medicines and history of allergies to prevent drug reactions and interactions.  Marking the surgical site before the procedure.  Washing and disinfecting the surgical site before the procedure to prevent infection.  Giving you a list of safety steps to take before surgery. This usually includes what foods or medicines to avoid and any lifestyle changes you should make before the procedure, such as quitting smoking. How can I help to promote a safe surgery? One way you can help to prevent surgical error is by choosing a surgeon and facility that is right for you. To do this:  Ask how many times your surgeon has performed the  surgery you will be having.  Ask how often the surgery is done in the hospital where you will have the procedure.  Choose a hospital that is licensed, accredited, and has a system in place in case of an emergency. Here are some other steps you can take:  Talk to your anesthesia provider about your medicines, allergies, any drug use, and any previous drug or anesthesia reactions you have had. Be honest.  Review the surgical consent form. Make sure you understand it completely.  Mark your surgical site. This will be done just before surgery. Your health care provider will give you a special marker to use. Make sure it is the correct area for surgery.  Do not eat any food or drink before surgery if your surgeon has instructed you not to.  Keep the surgical area clean. Follow instructions if you are asked to shower with an antibacterial soap.  Follow all of your home care instructions. Wash your hands often, and especially before bandage (dressing) changes.   How can my friends or family members promote a safe surgery? Friends and family can help by:  Going with you to your medical appointments before your surgery to make sure you understand the procedure.  Reviewing the safety steps that you need to take before surgery and making sure that you follow them.  Knowing what surgery you are scheduled to have.  Double-checking that the correct body part is marked before surgery.  Going with you to the procedure.  Advocating for you on your behalf if you cannot do so.  Following the home care instructions for after the surgery to help you heal well. Questions to ask before surgery 1. Why do I need surgery? Is another treatment available? 2. What are the risks and benefits of the surgery? 3. Do I have a choice of anesthesia? 4. What should I expect after surgery? Can I eat and drink after surgery? How much pain will I have? When can I go back to work? 5. What is your experience with this  type of surgery? Is the surgery center accredited? 6. Do you take my insurance? How much will the surgery cost? Where to find more information  [employer]: http://www.hawkins-wells.info/  Centers for Disease Control and Prevention: Verified[employer].gl Summary  Your surgical team takes specific precautions to prevent surgical errors and complications.  You can take steps to promote a safe surgery.  Ask questions before your procedure, and make sure that you understand what will happen.  Make sure that you understand your instructions for home care. This information is not intended to replace advice given to you by your health care provider. Make sure you discuss any questions you have with your health care provider. Document Revised: 10/20/2019 Document Reviewed: 10/20/2019 Elsevier Patient Education  [place].

## 2020-08-21 NOTE — Progress Notes (Addendum)
Established Patient Office Visit  Subjective:  Patient ID: Sara Hancock, female    DOB: Aug 02, 1934  Age: 86 y.o. MRN: [id number]  CC: Bilateral cataracts   HPI Sara Hancock is a 86 year old Caucasian female that presents for pre-surgical evaluation for bilateral cataract extraction with intraocular lens implants scheduled with Dr Susa Simmonds. She lives alone, continues to drive, and is independent with all ADLs. The surgery is medically necessary to remain independent and prevent injuries related to visual impairment. She denies prior difficulty or complications with anesthesia. She does not have obstructive sleep apnea. Denies recent coughs or URI symptoms. She has obtained COVID-19 vaccines and seasonal flu immunization. She has never smoked cigarettes.   Sara Hancock has a past medical history of chronic atrial-fibrillation. She is followed by Dr Caryl Comes, cardiology. She was seen at his office yesterday. She has stopped Warfarin and began Eliquis 2.5 mg BID as directed until after surgery.  Sara Hancock has a past medical history of chronic renal failure. GFR 24 per last labs on 07/16/20, stage 4. She is followed by Dr Joylene Grapes, nephrology. She should receive minimal IVF during cataract procedure.    Sara Hancock has a past medical history of chronic anemia. Hgb 10.2 and Hct 30.7 per last CBC on 07/16/20. She denies weakness or falling at home. She has remained stable for the past 8 months. Denies hematuria or bloody stools.  Sara Hancock has a past medical history of hypertension. Current treatment includes Metoprolol 100 mg, Irbesartan 300 mg , and Apresoline 100 mg BID. Hypertension is well-controlled. BP in office today 122/76. She denies chest pain, dyspnea, headaches, or syncopal episodes.She exercises daily and consumes a heart healthy diet.  Sara Hancock has a past medical history of type 2 diabetes mellitus. Current treatment includes Janumet 50-1000 daily and Levemir insulin 32 units in am and 18  units at dinner. Blood glucose well-controlled per HgbA1C 6.3 on 06/04/20.She denies hypoglycemic episodes. She has been instructed to hold diabetes medications the morning of surgery.   Sara Hancock has a past medical history of hyperlipidemia. Current treatment includes Zocor 40 mg daily.Lipids well-controlled per last labs on 06/04/20: TC 117, Trig 85, HDL 48, and LDL 52. She denies myalgias or arthralgias related to statin therapy.   Past Medical History:  Diagnosis Date  . Acquired thrombophilia ([place]) 09/11/2019  . Allergic rhinitis 09/17/2015  . Anemia   . ANEMIA, NORMOCYTIC 10/27/2007   Qualifier: Diagnosis of  By: Nils Pyle CMA (AAMA), Mearl Latin    . Anticoagulant long-term use 01/08/2011  . Arthritis   . Benign neoplasm of colon 01/08/2011  . Biliary cirrhosis ([place])   . BRADYCARDIA 11/15/2008   Qualifier: Diagnosis of  By: Hollie Salk CMA, Estill Bamberg    . Cellulitis of left foot 09/25/2016  . Cholelithiasis 01/09/12  . Chronic kidney disease, stage 3 ([place])   . Combined form of age-related cataract, both eyes 02/26/2017  . Diabetes mellitus type 2 without retinopathy ([place]) 10/27/2013  . Diverticulosis of colon with hemorrhage 06/08/2000  . Dyslipidemia 09/17/2015  . Encounter for therapeutic drug monitoring 06/09/2013  . Esophageal reflux 01/08/2011  . Esophageal stenosis 01/08/11  . Esophagitis 01/08/11  . GALLSTONES 10/26/2008   Qualifier: Diagnosis of  By: Sharlett Iles MD Byrd Hesselbach Gastritis, erosive chronic 01/08/2011  . GERD (gastroesophageal reflux disease)   . Glaucoma suspect of both eyes 02/26/2017  . Glaucoma suspect of both eyes 02/26/2017  . Hiatal hernia 01/08/11  . History of IBS 10/27/2007   Qualifier: Diagnosis of  By: Nils Pyle  CMA (AAMA), Mearl Latin    . HTN (hypertension)   . Hx of adenomatous colonic polyps 01/08/11  . Hyperplastic colon polyp 06/08/2000, 07/31/2003, 01/08/11  . Hypertension, essential 10/27/2007   Qualifier: Diagnosis of  By: Nils Pyle CMA (AAMA), Mearl Latin    . 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 Iron deficiency anemia 01/08/2011  . Left foot pain 09/25/2016  . Localized edema   . Long term (current) use of insulin ([place])   . Mixed hyperlipidemia   . Nuclear cataract 07/03/2011  . Obesity   . Obesity (BMI 30-39.9) 03/22/2012  . Osteoarthritis of right thumb 09/17/2015  . Osteopenia   . Other iron deficiency anemias   . Other specified menopausal and perimenopausal disorders   . PAF (paroxysmal atrial fibrillation) (HCC)    occurring in the setting of bradycardia and mild first degree AV block followed by Dr. Jens Som  . Pain in thoracic spine   . Paraesophageal hiatal hernia, 3cm by EGD 03/22/2012  . Pedal edema 12/13/2019  . Persistent atrial fibrillation ([place]) 08/28/2019  . Posterior vitreous detachment, bilateral 01/10/2016  . 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 Pure hypercholesterolemia   . Retinal tear, right 07/03/2011  . Shortness of breath 12/13/2019  . Stage 3b chronic kidney disease ([place]) 09/11/2019  . Type II or unspecified type diabetes mellitus without mention of complication, not stated as uncontrolled   . Uterine fibroid 01/09/12    Past Surgical History:  Procedure Laterality Date  . BREAST CYST EXCISION     left  . CARDIOVERSION N/A 11/27/2017   Procedure: CARDIOVERSION;  Surgeon: Sanda Klein, MD;  Location: [facility];  Service: Cardiovascular;  Laterality: N/A;  . COLONOSCOPY    . INSERTION OF MESH  05/18/2012   Procedure: INSERTION OF MESH;  Surgeon: Adin Hector, MD;  Location: [facility];  Service: General;  Laterality: N/A;  . LIVER BIOPSY  05/18/2012   Procedure: LIVER BIOPSY;  Surgeon: Adin Hector, MD;  Location: [facility];  Service: General;;  . POLYPECTOMY    . THYROIDECTOMY, PARTIAL  1980  . TUBAL LIGATION  1972  . VENTRAL HERNIA REPAIR   05/18/2012   Procedure: LAPAROSCOPIC VENTRAL HERNIA;  Surgeon: Adin Hector, MD;  Location: [facility];  Service: General;  Laterality: N/A;  Laparoscopic Ventral Wall Hernia with Mesh    Family History  Problem Relation Age of Onset  . Heart attack Father   . Stroke Mother   . Diabetes Sister        x 2  . Aneurysm Sister   . Stroke Sister   . Congestive Heart Failure Sister   . Diabetes Brother   . Kidney failure Other   . Hyperlipidemia Other   . Arrhythmia Other   . Congestive Heart Failure Other   . Hypertension Other   . Arthritis Other   . Colon cancer Neg Hx   . Esophageal cancer Neg Hx   . Stomach cancer Neg Hx   . Rectal cancer Neg Hx     Social History   Socioeconomic History  . Marital status: Married    Spouse name: Not on file  . Number of children: 2  . Years of education: Not on file  . Highest education level: Not on file  Occupational History  . Occupation: retired  Employer: RETIRED  Tobacco Use  . Smoking status: Never Smoker  . Smokeless tobacco: Never Used  Vaping Use  . Vaping Use: Never used  Substance and Sexual Activity  . Alcohol use: No  . Drug use: No  . Sexual activity: Not Currently  Other Topics Concern  . Not on file  Social History Narrative  . Not on file      Outpatient Medications Prior to Visit  Medication Sig Dispense Refill  . ACCU-CHEK AVIVA PLUS test strip USE TO TEST TWICE DAILY 200 strip 3  . Accu-Chek FastClix Lancets MISC USE AS DIRECTED TO CHECK BLOOD SUGAR TWICE DAILY 204 each 3  . apixaban (ELIQUIS) 2.5 MG TABS tablet Take 1 tablet (2.5 mg total) by mouth 2 (two) times daily. 180 tablet 1  . calcium-vitamin D (OSCAL WITH D) 250-125 MG-UNIT per tablet Take 1 tablet by mouth daily.    . Cholecalciferol (VITAMIN D3) 1000 units CAPS Take 1,000 Units by mouth daily.    . fexofenadine (ALLEGRA) 180 MG tablet Take 180 mg by mouth daily.    . fluticasone (FLONASE) 50 MCG/ACT nasal spray Place 1 spray into both  nostrils as needed.     . folic acid (FOLVITE) 950 MCG tablet Take 400 mcg by mouth daily.    . hydrALAZINE (APRESOLINE) 100 MG tablet Take 100 mg by mouth 2 (two) times daily.    . Insulin Pen Needle (BD PEN NEEDLE NANO 2ND GEN) 32G X 4 MM MISC Use a new needle each time when checking FBS 200 each 3  . irbesartan (AVAPRO) 300 MG tablet TAKE 1 TABLET BY MOUTH DAILY 90 tablet 1  . JANUMET 50-1000 MG tablet TAKE 1 TABLET BY MOUTH TWICE DAILY WITH MEALS 180 tablet 1  . ketoconazole (NIZORAL) 2 % cream Apply 1 application topically daily as needed. Around skin folds for irritation    . LEVEMIR FLEXTOUCH 100 UNIT/ML FlexPen INJECT 32 UNITS UNDER THE SKIN EVERY MORNING AND 18 UNITS WITH SUPPER 45 mL 1  . metoprolol tartrate (LOPRESSOR) 100 MG tablet TAKE 1 TABLET(100 MG) BY MOUTH TWICE DAILY 180 tablet 3  . Multiple Vitamin (MULTIVITAMIN WITH MINERALS) TABS Take 1 tablet by mouth daily.    . pantoprazole (PROTONIX) 40 MG tablet TAKE 1 TABLET BY MOUTH EVERY DAY 90 tablet 3  . simvastatin (ZOCOR) 40 MG tablet TAKE 1 TABLET BY MOUTH AT BEDTIME 90 tablet 1  . spironolactone (ALDACTONE) 25 MG tablet Take 25 mg by mouth daily.    Marland Kitchen torsemide (DEMADEX) 20 MG tablet Take 2 tablets (40 mg total) by mouth 2 (two) times daily. 360 tablet 3  . ursodiol (ACTIGALL) 300 MG capsule TAKE 1 CAPSULE(300 MG) BY MOUTH TWICE DAILY 180 capsule 3  . vitamin B-12 (CYANOCOBALAMIN) 1000 MCG tablet Take 1,000 mcg by mouth daily.    . vitamin C (ASCORBIC ACID) 500 MG tablet Take 500 mg by mouth daily.    . vitamin E 400 UNIT capsule Take 400 Units by mouth daily.     No facility-administered medications prior to visit.    Allergies  Allergen Reactions  . Ace Inhibitors Anaphylaxis  . Capoten [Captopril] Anaphylaxis    Throat swells shut  . Neomycin-Bacitracin Zn-Polymyx Rash    ROS Review of Systems  Constitutional: Negative for appetite change, fatigue and unexpected weight change.  HENT: Negative for congestion, ear  pain, rhinorrhea, sinus pressure, sinus pain and tinnitus.   Eyes: Positive for visual disturbance (bilateral cataracts). Negative for pain.  Respiratory: Negative for cough and shortness of breath.   Cardiovascular: Negative for chest pain, palpitations and leg swelling.  Gastrointestinal: Negative for abdominal pain, constipation, diarrhea, nausea and vomiting.  Endocrine: Negative for cold intolerance, heat intolerance, polydipsia, polyphagia and polyuria.  Genitourinary: Negative for dysuria, frequency and hematuria.  Musculoskeletal: Negative for arthralgias, back pain, joint swelling and myalgias.  Skin: Negative for rash.  Allergic/Immunologic: Negative for environmental allergies.  Neurological: Negative for dizziness and headaches.  Hematological: Negative for adenopathy.  Psychiatric/Behavioral: Negative for decreased concentration and sleep disturbance. The patient is not nervous/anxious.       Objective:    Physical Exam Vitals reviewed.  Constitutional:      Appearance: Normal appearance.  HENT:     Head: Normocephalic.     Nose: Nose normal.     Mouth/Throat:     Mouth: Mucous membranes are moist.  Cardiovascular:     Rate and Rhythm: Normal rate. Rhythm irregular.     Pulses: Normal pulses.     Heart sounds: Normal heart sounds.  Pulmonary:     Effort: Pulmonary effort is normal.     Breath sounds: Normal breath sounds.  Abdominal:     General: Bowel sounds are normal.     Palpations: Abdomen is soft.     Tenderness: There is no abdominal tenderness. There is no guarding.  Skin:    General: Skin is warm and dry.     Capillary Refill: Capillary refill takes less than 2 seconds.  Neurological:     Mental Status: She is alert and oriented to person, place, and time.  Psychiatric:        Mood and Affect: Mood normal.        Behavior: Behavior normal.     BP 122/76   Pulse 60   Temp (!) 97.3 F (36.3 C)   Resp 16   Ht 5\' 2"  (1.575 m)   Wt 166 lb (75.3  kg)   BMI 30.36 kg/m  Wt Readings from Last 3 Encounters:  08/21/20 166 lb (75.3 kg)  08/20/20 166 lb (75.3 kg)  06/26/20 166 lb (75.3 kg)      Lab Results  Component Value Date   TSH 1.67 02/26/2017   Lab Results  Component Value Date   WBC 7.0 07/16/2020   HGB 10.2 (L) 07/16/2020   HCT 30.7 (L) 07/16/2020   MCV 92 07/16/2020   PLT 218 07/16/2020   Lab Results  Component Value Date   NA 147 (H) 07/16/2020   K 4.3 07/16/2020   CO2 24 07/16/2020   GLUCOSE 123 (H) 07/16/2020   BUN 73 (H) 07/16/2020   CREATININE 2.05 (H) 07/16/2020   BILITOT 0.3 06/04/2020   ALKPHOS 115 06/04/2020   AST 26 06/04/2020   ALT 18 06/04/2020   PROT 6.9 06/04/2020   ALBUMIN 4.0 07/16/2020   CALCIUM 9.0 07/16/2020   GFR 25.57 (L) 05/17/2020   Lab Results  Component Value Date   CHOL 117 06/04/2020   Lab Results  Component Value Date   HDL 48 06/04/2020   Lab Results  Component Value Date   LDLCALC 52 06/04/2020   Lab Results  Component Value Date   TRIG 85 06/04/2020   Lab Results  Component Value Date   CHOLHDL 2.4 06/04/2020   Lab Results  Component Value Date   HGBA1C 6.3 (H) 06/04/2020      Assessment & Plan:   1. Other age-related cataract of both eyes -follow-up with Dr Susa Simmonds  as scheduled  2. Permanent atrial fibrillation (HCC)-stable -Continue Eliquis 2.5 mg BID -continue follow-up with Dr Caryl Comes, cardiology, as scheduled  3. 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 Anemia due to stage 4 chronic kidney disease (HCC)-stable -continue folic acid and MVI daily  5. Combined hyperlipidemia associated with type 2 diabetes mellitus (HCC)-well controlled -continue Zocor 40 mg daily  6.  Preoperative evaluation to rule out surgical contraindication - POCT urinalysis dipstick  Follow-up: May 2,2022 with Dr Tobie Poet   Signed Rip Harbour, NP

## 2020-09-03 ENCOUNTER — Ambulatory Visit (INDEPENDENT_AMBULATORY_CARE_PROVIDER_SITE_OTHER): Payer: Medicare Other

## 2020-09-03 ENCOUNTER — Other Ambulatory Visit: Payer: Self-pay

## 2020-09-03 DIAGNOSIS — M810 Age-related osteoporosis without current pathological fracture: Secondary | ICD-10-CM | POA: Diagnosis not present

## 2020-09-03 DIAGNOSIS — H25813 Combined forms of age-related cataract, bilateral: Secondary | ICD-10-CM | POA: Diagnosis not present

## 2020-09-03 MED ORDER — DENOSUMAB 60 MG/ML ~~LOC~~ SOSY
60.0000 mg | PREFILLED_SYRINGE | Freq: Once | SUBCUTANEOUS | Status: AC
  Administered 2020-09-03: 60 mg via SUBCUTANEOUS

## 2020-09-06 DIAGNOSIS — H25813 Combined forms of age-related cataract, bilateral: Secondary | ICD-10-CM | POA: Diagnosis not present

## 2020-09-06 DIAGNOSIS — I10 Essential (primary) hypertension: Secondary | ICD-10-CM | POA: Diagnosis not present

## 2020-09-06 DIAGNOSIS — H25812 Combined forms of age-related cataract, left eye: Secondary | ICD-10-CM | POA: Diagnosis not present

## 2020-09-07 DIAGNOSIS — Z79899 Other long term (current) drug therapy: Secondary | ICD-10-CM | POA: Diagnosis not present

## 2020-09-07 DIAGNOSIS — H25811 Combined forms of age-related cataract, right eye: Secondary | ICD-10-CM | POA: Diagnosis not present

## 2020-09-07 DIAGNOSIS — Z48810 Encounter for surgical aftercare following surgery on the sense organs: Secondary | ICD-10-CM | POA: Diagnosis not present

## 2020-09-07 DIAGNOSIS — Z794 Long term (current) use of insulin: Secondary | ICD-10-CM | POA: Diagnosis not present

## 2020-09-07 DIAGNOSIS — H43813 Vitreous degeneration, bilateral: Secondary | ICD-10-CM | POA: Diagnosis not present

## 2020-09-07 DIAGNOSIS — E1136 Type 2 diabetes mellitus with diabetic cataract: Secondary | ICD-10-CM | POA: Diagnosis not present

## 2020-09-07 DIAGNOSIS — Z961 Presence of intraocular lens: Secondary | ICD-10-CM | POA: Diagnosis not present

## 2020-09-10 ENCOUNTER — Other Ambulatory Visit: Payer: Medicare Other

## 2020-09-10 ENCOUNTER — Other Ambulatory Visit: Payer: Medicare Other | Admitting: Family Medicine

## 2020-09-10 ENCOUNTER — Other Ambulatory Visit: Payer: Self-pay | Admitting: Family Medicine

## 2020-09-10 ENCOUNTER — Other Ambulatory Visit: Payer: Self-pay

## 2020-09-10 DIAGNOSIS — I131 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, with stage 1 through stage 4 chronic kidney disease, or unspecified chronic kidney disease: Secondary | ICD-10-CM | POA: Diagnosis not present

## 2020-09-10 DIAGNOSIS — D631 Anemia in chronic kidney disease: Secondary | ICD-10-CM

## 2020-09-10 DIAGNOSIS — E1122 Type 2 diabetes mellitus with diabetic chronic kidney disease: Secondary | ICD-10-CM | POA: Diagnosis not present

## 2020-09-10 DIAGNOSIS — E782 Mixed hyperlipidemia: Secondary | ICD-10-CM | POA: Diagnosis not present

## 2020-09-10 DIAGNOSIS — I4821 Permanent atrial fibrillation: Secondary | ICD-10-CM

## 2020-09-10 DIAGNOSIS — N184 Chronic kidney disease, stage 4 (severe): Secondary | ICD-10-CM | POA: Diagnosis not present

## 2020-09-11 LAB — LIPID PANEL
Chol/HDL Ratio: 2.4 ratio (ref 0.0–4.4)
Cholesterol, Total: 120 mg/dL (ref 100–199)
HDL: 49 mg/dL (ref >39)
LDL Chol Calc (NIH): 56 mg/dL (ref 0–99)
Triglycerides: 70 mg/dL (ref 0–149)
VLDL Cholesterol Cal: 15 mg/dL (ref 5–40)

## 2020-09-11 LAB — CBC WITH DIFFERENTIAL/PLATELET
Basophils Absolute: 0 10*3/uL (ref 0.0–0.2)
Basos: 1 %
EOS (ABSOLUTE): 0 10*3/uL (ref 0.0–0.4)
Eos: 1 %
Hematocrit: 30.2 % — ABNORMAL LOW (ref 34.0–46.6)
Hemoglobin: 9.5 g/dL — ABNORMAL LOW (ref 11.1–15.9)
Immature Grans (Abs): 0 10*3/uL (ref 0.0–0.1)
Immature Granulocytes: 1 %
Lymphocytes Absolute: 1.1 10*3/uL (ref 0.7–3.1)
Lymphs: 17 %
MCH: 30.7 pg (ref 26.6–33.0)
MCHC: 31.5 g/dL (ref 31.5–35.7)
MCV: 98 fL — ABNORMAL HIGH (ref 79–97)
Monocytes Absolute: 0.6 10*3/uL (ref 0.1–0.9)
Monocytes: 9 %
Neutrophils Absolute: 4.9 10*3/uL (ref 1.4–7.0)
Neutrophils: 71 %
Platelets: 212 10*3/uL (ref 150–450)
RBC: 3.09 x10E6/uL — ABNORMAL LOW (ref 3.77–5.28)
RDW: 14.5 % (ref 11.7–15.4)
WBC: 6.7 10*3/uL (ref 3.4–10.8)

## 2020-09-11 LAB — HEMOGLOBIN A1C
Est. average glucose Bld gHb Est-mCnc: 148 mg/dL
Hgb A1c MFr Bld: 6.8 % — ABNORMAL HIGH (ref 4.8–5.6)

## 2020-09-11 LAB — COMPREHENSIVE METABOLIC PANEL
ALT: 14 IU/L (ref 0–32)
AST: 23 IU/L (ref 0–40)
Albumin/Globulin Ratio: 1.6 (ref 1.2–2.2)
Albumin: 4.2 g/dL (ref 3.6–4.6)
Alkaline Phosphatase: 103 IU/L (ref 44–121)
BUN/Creatinine Ratio: 31 — ABNORMAL HIGH (ref 12–28)
BUN: 69 mg/dL — ABNORMAL HIGH (ref 8–27)
Bilirubin Total: 0.4 mg/dL (ref 0.0–1.2)
CO2: 22 mmol/L (ref 20–29)
Calcium: 8.6 mg/dL — ABNORMAL LOW (ref 8.7–10.3)
Chloride: 103 mmol/L (ref 96–106)
Creatinine, Ser: 2.26 mg/dL — ABNORMAL HIGH (ref 0.57–1.00)
Globulin, Total: 2.7 g/dL (ref 1.5–4.5)
Glucose: 120 mg/dL — ABNORMAL HIGH (ref 65–99)
Potassium: 4.4 mmol/L (ref 3.5–5.2)
Sodium: 144 mmol/L (ref 134–144)
Total Protein: 6.9 g/dL (ref 6.0–8.5)
eGFR: 21 mL/min/{1.73_m2} — ABNORMAL LOW (ref >59)

## 2020-09-11 LAB — CARDIOVASCULAR RISK ASSESSMENT

## 2020-09-11 LAB — TSH: TSH: 2.02 u[IU]/mL (ref 0.450–4.500)

## 2020-09-12 ENCOUNTER — Other Ambulatory Visit: Payer: Self-pay

## 2020-09-12 ENCOUNTER — Ambulatory Visit (INDEPENDENT_AMBULATORY_CARE_PROVIDER_SITE_OTHER): Payer: Medicare Other | Admitting: Family Medicine

## 2020-09-12 VITALS — BP 104/68 | HR 96 | Temp 97.2°F | Resp 18 | Ht 62.0 in | Wt 170.4 lb

## 2020-09-12 DIAGNOSIS — K743 Primary biliary cirrhosis: Secondary | ICD-10-CM

## 2020-09-12 DIAGNOSIS — E538 Deficiency of other specified B group vitamins: Secondary | ICD-10-CM | POA: Diagnosis not present

## 2020-09-12 DIAGNOSIS — D6869 Other thrombophilia: Secondary | ICD-10-CM

## 2020-09-12 DIAGNOSIS — D631 Anemia in chronic kidney disease: Secondary | ICD-10-CM | POA: Diagnosis not present

## 2020-09-12 DIAGNOSIS — Z6831 Body mass index (BMI) 31.0-31.9, adult: Secondary | ICD-10-CM

## 2020-09-12 DIAGNOSIS — I4821 Permanent atrial fibrillation: Secondary | ICD-10-CM | POA: Diagnosis not present

## 2020-09-12 DIAGNOSIS — N184 Chronic kidney disease, stage 4 (severe): Secondary | ICD-10-CM | POA: Diagnosis not present

## 2020-09-12 DIAGNOSIS — E782 Mixed hyperlipidemia: Secondary | ICD-10-CM | POA: Diagnosis not present

## 2020-09-12 DIAGNOSIS — I131 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, with stage 1 through stage 4 chronic kidney disease, or unspecified chronic kidney disease: Secondary | ICD-10-CM | POA: Diagnosis not present

## 2020-09-12 DIAGNOSIS — N1832 Chronic kidney disease, stage 3b: Secondary | ICD-10-CM | POA: Insufficient documentation

## 2020-09-12 DIAGNOSIS — M810 Age-related osteoporosis without current pathological fracture: Secondary | ICD-10-CM | POA: Diagnosis not present

## 2020-09-12 DIAGNOSIS — E1122 Type 2 diabetes mellitus with diabetic chronic kidney disease: Secondary | ICD-10-CM | POA: Insufficient documentation

## 2020-09-12 MED ORDER — FEXOFENADINE HCL 60 MG PO TABS: 60.0000 mg | ORAL_TABLET | Freq: Two times a day (BID) | ORAL | 1 refills | Status: AC

## 2020-09-12 NOTE — Progress Notes (Signed)
Subjective:  Patient ID: Sara Hancock, female    DOB: Nov 29, 1934  Age: 86 y.o. MRN: [id number]  Chief Complaint  Patient presents with  . Diabetes  . Hyperlipidemia  . Hypertension    HPI Diabetes with CKD stage 4: Sugars 90-110. Checks sugars daily. Daily foot checks. Gets annual eye exam. Eats healthy. On janumet 50/1000 mg one twice daily levemir 32 U in am and 18 U in pm.  Hypertensive heart disease with CKD stage 4: On hydralazine, spironolactone, demadex, irbesartan.  BP well controlled.  Hyperlipidemia: on zocor 40 mg once daily. Persistent atrial fibrillation: on eliquis 2.5 mg one twice a day. Primary biliary cholangitis: pt is on ursodiol. Sees GI.   Current Outpatient Medications on File Prior to Visit  Medication Sig Dispense Refill  . ACCU-CHEK AVIVA PLUS test strip USE TO TEST TWICE DAILY 200 strip 3  . Accu-Chek FastClix Lancets MISC USE AS DIRECTED TO CHECK BLOOD SUGAR TWICE DAILY 204 each 3  . apixaban (ELIQUIS) 2.5 MG TABS tablet Take 1 tablet (2.5 mg total) by mouth 2 (two) times daily. 180 tablet 1  . calcium-vitamin D (OSCAL WITH D) 250-125 MG-UNIT per tablet Take 1 tablet by mouth daily.    . Cholecalciferol (VITAMIN D3) 1000 units CAPS Take 1,000 Units by mouth daily.    . fluticasone (FLONASE) 50 MCG/ACT nasal spray Place 1 spray into both nostrils as needed.     . folic acid (FOLVITE) 676 MCG tablet Take 400 mcg by mouth daily.    . Insulin Pen Needle (BD PEN NEEDLE NANO 2ND GEN) 32G X 4 MM MISC Use a new needle each time when checking FBS 200 each 3  . irbesartan (AVAPRO) 300 MG tablet TAKE 1 TABLET BY MOUTH DAILY 90 tablet 1  . JANUMET 50-1000 MG tablet TAKE 1 TABLET BY MOUTH TWICE DAILY WITH MEALS 180 tablet 1  . ketoconazole (NIZORAL) 2 % cream Apply 1 application topically daily as needed. Around skin folds for irritation    . metoprolol tartrate (LOPRESSOR) 100 MG tablet TAKE 1 TABLET(100 MG) BY MOUTH TWICE DAILY 180 tablet 3  . Multiple Vitamin  (MULTIVITAMIN WITH MINERALS) TABS Take 1 tablet by mouth daily.    . pantoprazole (PROTONIX) 40 MG tablet TAKE 1 TABLET BY MOUTH EVERY DAY 90 tablet 3  . simvastatin (ZOCOR) 40 MG tablet TAKE 1 TABLET BY MOUTH AT BEDTIME 90 tablet 1  . spironolactone (ALDACTONE) 25 MG tablet Take 25 mg by mouth daily.    Marland Kitchen torsemide (DEMADEX) 20 MG tablet Take 2 tablets (40 mg total) by mouth 2 (two) times daily. 360 tablet 3  . ursodiol (ACTIGALL) 300 MG capsule TAKE 1 CAPSULE(300 MG) BY MOUTH TWICE DAILY 180 capsule 3  . vitamin B-12 (CYANOCOBALAMIN) 1000 MCG tablet Take 1,000 mcg by mouth daily.    . vitamin C (ASCORBIC ACID) 500 MG tablet Take 500 mg by mouth daily.    . vitamin E 400 UNIT capsule Take 400 Units by mouth daily.     No current facility-administered medications on file prior to visit.   Past Medical History:  Diagnosis Date  . Acquired thrombophilia ([place]) 09/11/2019  . Allergic rhinitis 09/17/2015  . Anemia   . ANEMIA, NORMOCYTIC 10/27/2007   Qualifier: Diagnosis of  By: Nils Pyle CMA (AAMA), Mearl Latin    . Anticoagulant long-term use 01/08/2011  . Arthritis   . Benign neoplasm of colon 01/08/2011  . Biliary cirrhosis ([place])   . BRADYCARDIA 11/15/2008   Qualifier:  Diagnosis of  By: Hollie Salk CMA, Estill Bamberg    . Cellulitis of left foot 09/25/2016  . Cholelithiasis 01/09/12  . Chronic kidney disease, stage 3 ([place])   . Combined form of age-related cataract, both eyes 02/26/2017  . Diabetes mellitus type 2 without retinopathy ([place]) 10/27/2013  . Diverticulosis of colon with hemorrhage 06/08/2000  . Dyslipidemia 09/17/2015  . Encounter for therapeutic drug monitoring 06/09/2013  . Esophageal reflux 01/08/2011  . Esophageal stenosis 01/08/11  . Esophagitis 01/08/11  . GALLSTONES 10/26/2008   Qualifier: Diagnosis of  By: Sharlett Iles MD Byrd Hesselbach Gastritis, erosive chronic 01/08/2011  . GERD (gastroesophageal reflux disease)   . Glaucoma suspect of both eyes 02/26/2017  . Glaucoma suspect of both eyes  02/26/2017  . Hiatal hernia 01/08/11  . History of IBS 10/27/2007   Qualifier: Diagnosis of  By: Nils Pyle CMA ([place]), Mearl Latin    . HTN (hypertension)   . Hx of adenomatous colonic polyps 01/08/11  . Hyperplastic colon polyp 06/08/2000, 07/31/2003, 01/08/11  . Hypertension, essential 10/27/2007   Qualifier: Diagnosis of  By: Nils Pyle CMA (AAMA), Mearl Latin    . 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 Iron deficiency anemia 01/08/2011  . Left foot pain 09/25/2016  . Localized edema   . Long term (current) use of insulin ([place])   . Mixed hyperlipidemia   . Nuclear cataract 07/03/2011  . Obesity   . Obesity (BMI 30-39.9) 03/22/2012  . Osteoarthritis of right thumb 09/17/2015  . Osteopenia   . Other iron deficiency anemias   . Other specified menopausal and perimenopausal disorders   . PAF (paroxysmal atrial fibrillation) (HCC)    occurring in the setting of bradycardia and mild first degree AV block followed by Dr. Jens Som  . Pain in thoracic spine   . Paraesophageal hiatal hernia, 3cm by EGD 03/22/2012  . Pedal edema 12/13/2019  . Persistent atrial fibrillation ([place]) 08/28/2019  . Posterior vitreous detachment, bilateral 01/10/2016  . Primary biliary cholangitis ([place]) 10/27/2007   Qualifier: Diagnosis of  By: Nils Pyle CMA (AAMA), Mearl Latin    . Pure hypercholesterolemia   . Retinal tear, right 07/03/2011  . Shortness of breath 12/13/2019  . Stage 3b chronic kidney disease ([place]) 09/11/2019  . Type II or unspecified type diabetes mellitus without mention of complication, not stated as uncontrolled   . Uterine fibroid 01/09/12   Past Surgical History:  Procedure Laterality Date  . BREAST CYST EXCISION     left  . CARDIOVERSION N/A 11/27/2017   Procedure: CARDIOVERSION;  Surgeon: Sanda Klein, MD;  Location: [facility];  Service: Cardiovascular;  Laterality: N/A;  . COLONOSCOPY    . INSERTION  OF MESH  05/18/2012   Procedure: INSERTION OF MESH;  Surgeon: Adin Hector, MD;  Location: [facility];  Service: General;  Laterality: N/A;  . LIVER BIOPSY  05/18/2012   Procedure: LIVER BIOPSY;  Surgeon: Adin Hector, MD;  Location: [facility];  Service: General;;  . POLYPECTOMY    . THYROIDECTOMY, PARTIAL  1980  . TUBAL LIGATION  1972  . VENTRAL HERNIA REPAIR  05/18/2012   Procedure: LAPAROSCOPIC VENTRAL HERNIA;  Surgeon: Adin Hector, MD;  Location: [facility];  Service: General;  Laterality: N/A;  Laparoscopic Ventral Wall Hernia with Mesh    Family History  Problem Relation Age of Onset  . Heart attack Father   . Stroke Mother   .  Diabetes Sister        x 2  . Aneurysm Sister   . Stroke Sister   . Congestive Heart Failure Sister   . Diabetes Brother   . Kidney failure Other   . Hyperlipidemia Other   . Arrhythmia Other   . Congestive Heart Failure Other   . Hypertension Other   . Arthritis Other   . Colon cancer Neg Hx   . Esophageal cancer Neg Hx   . Stomach cancer Neg Hx   . Rectal cancer Neg Hx    Social History   Socioeconomic History  . Marital status: Married    Spouse name: Not on file  . Number of children: 2  . Years of education: Not on file  . Highest education level: Not on file  Occupational History  . Occupation: retired    Fish farm manager: RETIRED  Tobacco Use  . Smoking status: Never Smoker  . Smokeless tobacco: Never Used  Vaping Use  . Vaping Use: Never used  Substance and Sexual Activity  . Alcohol use: No  . Drug use: No  . Sexual activity: Not Currently  Other Topics Concern  . Not on file  Social History Narrative  . Not on file   Social Determinants of Health   Financial Resource Strain: Not on file  Food Insecurity: Not on file  Transportation Needs: Not on file  Physical Activity: Not on file  Stress: Not on file  Social Connections: Not on file    Review of Systems  Constitutional: Negative for chills, fatigue and fever.  HENT:  Negative for congestion, ear pain and sore throat.   Respiratory: Negative for cough and shortness of breath.   Cardiovascular: Negative for chest pain.  Gastrointestinal: Negative for abdominal pain, constipation, diarrhea, nausea and vomiting.  Endocrine: Negative for polydipsia, polyphagia and polyuria.  Genitourinary: Negative for dysuria and urgency.  Musculoskeletal: Negative for arthralgias and myalgias.  Skin: Negative for rash.  Neurological: Negative for dizziness and headaches.  Psychiatric/Behavioral: Negative for dysphoric mood. The patient is not nervous/anxious.      Objective:  BP 104/68   Pulse 96   Temp (!) 97.2 F (36.2 C)   Resp 18   Ht 5\' 2"  (1.575 m)   Wt 170 lb 6.4 oz (77.3 kg)   BMI 31.17 kg/m   BP/Weight 09/12/2020 08/21/2020 3/71/6967  Systolic BP 893 810 175  Diastolic BP 68 76 80  Wt. (Lbs) 170.4 166 166  BMI 31.17 30.36 30.36    Physical Exam Vitals reviewed.  Constitutional:      Appearance: Normal appearance. She is normal weight.  Neck:     Vascular: No carotid bruit.  Cardiovascular:     Rate and Rhythm: Normal rate. Rhythm irregular.     Pulses: Normal pulses.     Heart sounds: Normal heart sounds.  Pulmonary:     Effort: Pulmonary effort is normal. No respiratory distress.     Breath sounds: Normal breath sounds.  Abdominal:     General: Abdomen is flat. Bowel sounds are normal.     Palpations: Abdomen is soft.     Tenderness: There is no abdominal tenderness.  Neurological:     Mental Status: She is alert and oriented to person, place, and time.  Psychiatric:        Mood and Affect: Mood normal.        Behavior: Behavior normal.     Diabetic Foot Exam - Simple   Simple Foot Form Diabetic  Foot exam was performed with the following findings: Yes 09/12/2020 11:27 AM  Visual Inspection No deformities, no ulcerations, no other skin breakdown bilaterally: Yes Sensation Testing Intact to touch and monofilament testing bilaterally:  Yes Pulse Check Posterior Tibialis and Dorsalis pulse intact bilaterally: Yes Comments      Lab Results  Component Value Date   WBC 6.7 09/10/2020   HGB 9.5 (L) 09/10/2020   HCT 30.2 (L) 09/10/2020   PLT 212 09/10/2020   GLUCOSE 120 (H) 09/10/2020   CHOL 120 09/10/2020   TRIG 70 09/10/2020   HDL 49 09/10/2020   LDLCALC 56 09/10/2020   ALT 14 09/10/2020   AST 23 09/10/2020   NA 144 09/10/2020   K 4.4 09/10/2020   CL 103 09/10/2020   CREATININE 2.26 (H) 09/10/2020   BUN 69 (H) 09/10/2020   CO2 22 09/10/2020   TSH 2.020 09/10/2020   INR 2.7 08/14/2020   HGBA1C 6.8 (H) 09/10/2020      Assessment & Plan:   1. Permanent atrial fibrillation (HCC) The current medical regimen is effective;  continue present plan and medications.  2. 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, with stage 1 through stage 4 chronic kidney disease, or unspecified chronic kidney disease Stable. No changes to medicines.  Continue to work on eating a healthy diet. Labs reviewed.    3. Anemia due to stage 4 chronic kidney disease (HCC) HB fairly stable.   4. CKD stage 4 due to type 2 diabetes mellitus ([place]) Stable Continue to work on eating a healthy diet and exercise.  Labs reviewed.  5. Senile osteoporosis Continue vitamin D.   6. Mixed hyperlipidemia The current medical regimen is effective;  continue present plan and medications. Recommend continue to work on eating healthy diet.  7. B12 deficiency Continue B12 supplementation  8. Acquired thrombophilia ([place]) On eliquis which is necessary for atrial fibrillation.  9. BMI 31.0-31.9,adult Continue eating healthy.   10. Primary biliary cholangitis (HCC) Continue actigall.  Meds ordered this encounter  Medications  . fexofenadine (ALLEGRA ALLERGY) 60 MG tablet    Sig: Take 1 tablet (60 mg total) by mouth 2 (two) times daily.    Dispense:  180 tablet    Refill:  1    Follow-up: Return in about 3  months (around 12/13/2020) for fasting.  An After Visit Summary was printed and given to the patient.  Rochel Brome, MD [facility] [phone]

## 2020-09-12 NOTE — Patient Instructions (Signed)
Recommend calcium citrate with vitamin D 500 mg 2 daily Change allegra to 60 mg one twice a day.

## 2020-09-13 ENCOUNTER — Ambulatory Visit: Payer: Medicare Other | Admitting: Family Medicine

## 2020-09-13 ENCOUNTER — Other Ambulatory Visit: Payer: Self-pay | Admitting: Family Medicine

## 2020-09-15 ENCOUNTER — Encounter: Payer: Self-pay | Admitting: Family Medicine

## 2020-09-20 DIAGNOSIS — H25811 Combined forms of age-related cataract, right eye: Secondary | ICD-10-CM | POA: Diagnosis not present

## 2020-09-20 DIAGNOSIS — Z7901 Long term (current) use of anticoagulants: Secondary | ICD-10-CM | POA: Diagnosis not present

## 2020-09-20 DIAGNOSIS — I10 Essential (primary) hypertension: Secondary | ICD-10-CM | POA: Diagnosis not present

## 2020-09-25 ENCOUNTER — Other Ambulatory Visit: Payer: Self-pay

## 2020-09-25 ENCOUNTER — Encounter: Payer: Self-pay | Admitting: Cardiology

## 2020-09-25 ENCOUNTER — Ambulatory Visit (INDEPENDENT_AMBULATORY_CARE_PROVIDER_SITE_OTHER): Payer: Medicare Other | Admitting: Cardiology

## 2020-09-25 VITALS — BP 136/74 | HR 80 | Ht 61.0 in | Wt 166.5 lb

## 2020-09-25 DIAGNOSIS — E669 Obesity, unspecified: Secondary | ICD-10-CM

## 2020-09-25 DIAGNOSIS — I4819 Other persistent atrial fibrillation: Secondary | ICD-10-CM | POA: Diagnosis not present

## 2020-09-25 DIAGNOSIS — I131 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, with stage 1 through stage 4 chronic kidney disease, or unspecified chronic kidney disease: Secondary | ICD-10-CM | POA: Diagnosis not present

## 2020-09-25 DIAGNOSIS — I10 Essential (primary) hypertension: Secondary | ICD-10-CM | POA: Diagnosis not present

## 2020-09-25 DIAGNOSIS — N183 Chronic kidney disease, stage 3 unspecified: Secondary | ICD-10-CM

## 2020-09-25 NOTE — Progress Notes (Signed)
Cardiology Office Note:    Date:  09/25/2020   ID:  [name], DOB 09-28-1934, MRN [id number]  PCP:  Rochel Brome, MD  Cardiologist:  Berniece Salines, DO  Electrophysiologist:  None   Referring MD: Rochel Brome, MD   I am doing fine  History of Present Illness:    Sara Hancock is a 86 y.o. female with a hx of atrial fibrillation on Coumadin and metoprolol, diastolic heart failure, hypertension presents today for follow-up visit.   I saw the patient on March 26, 2020 at that time we kept her on her for some mild twice daily.  But in the interim I have given the patient some metolazone due to bilateral leg edema.  She is here today for follow-up visit.  I saw the patient on June 26, 2020 at that time she appeared to be doing well from a cardiovascular standpoint.  She was at her baseline.  We did blood work with electrolytes.  And she continue to follow with our Coumadin clinic.  Since her last visit she has transitioned from Coumadin to Eliquis.  She has no complaints at this time.  Past Medical History:  Diagnosis Date  . Acquired thrombophilia ([place]) 09/11/2019  . Allergic rhinitis 09/17/2015  . Anemia   . ANEMIA, NORMOCYTIC 10/27/2007   Qualifier: Diagnosis of  By: Nils Pyle CMA (AAMA), Mearl Latin    . Anticoagulant long-term use 01/08/2011  . Arthritis   . Benign neoplasm of colon 01/08/2011  . Biliary cirrhosis ([place])   . BRADYCARDIA 11/15/2008   Qualifier: Diagnosis of  By: Hollie Salk CMA, Estill Bamberg    . Cellulitis of left foot 09/25/2016  . Cholelithiasis 01/09/12  . Chronic kidney disease, stage 3 ([place])   . Combined form of age-related cataract, both eyes 02/26/2017  . Diabetes mellitus type 2 without retinopathy ([place]) 10/27/2013  . Diverticulosis of colon with hemorrhage 06/08/2000  . Dyslipidemia 09/17/2015  . Encounter for therapeutic drug monitoring 06/09/2013  . Esophageal reflux 01/08/2011  . Esophageal stenosis 01/08/11  . Esophagitis 01/08/11  . GALLSTONES 10/26/2008    Qualifier: Diagnosis of  By: Sharlett Iles MD Byrd Hesselbach Gastritis, erosive chronic 01/08/2011  . GERD (gastroesophageal reflux disease)   . Glaucoma suspect of both eyes 02/26/2017  . Glaucoma suspect of both eyes 02/26/2017  . Hiatal hernia 01/08/11  . History of IBS 10/27/2007   Qualifier: Diagnosis of  By: Nils Pyle CMA ([place]), Mearl Latin    . HTN (hypertension)   . Hx of adenomatous colonic polyps 01/08/11  . Hyperplastic colon polyp 06/08/2000, 07/31/2003, 01/08/11  . Hypertension, essential 10/27/2007   Qualifier: Diagnosis of  By: Nils Pyle CMA (AAMA), Mearl Latin    . 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 Iron deficiency anemia 01/08/2011  . Left foot pain 09/25/2016  . Localized edema   . Long term (current) use of insulin ([place])   . Mixed hyperlipidemia   . Nuclear cataract 07/03/2011  . Obesity   . Obesity (BMI 30-39.9) 03/22/2012  . Osteoarthritis of right thumb 09/17/2015  . Osteopenia   . Other iron deficiency anemias   . Other specified menopausal and perimenopausal disorders   . PAF (paroxysmal atrial fibrillation) (H[facility])    occurring in the setting of bradycardia and mild first degree AV block followed by Dr. Jens Som  . Pain in thoracic spine   . Paraesophageal hiatal hernia, 3cm by  EGD 03/22/2012  . Pedal edema 12/13/2019  . Persistent atrial fibrillation ([place]) 08/28/2019  . Posterior vitreous detachment, bilateral 01/10/2016  . 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 Pure hypercholesterolemia   . Retinal tear, right 07/03/2011  . Shortness of breath 12/13/2019  . Stage 3b chronic kidney disease ([place]) 09/11/2019  . Type II or unspecified type diabetes mellitus without mention of complication, not stated as uncontrolled   . Uterine fibroid 01/09/12    Past Surgical History:  Procedure Laterality Date   . BREAST CYST EXCISION     left  . CARDIOVERSION N/A 11/27/2017   Procedure: CARDIOVERSION;  Surgeon: Sanda Klein, MD;  Location: [facility];  Service: Cardiovascular;  Laterality: N/A;  . COLONOSCOPY    . INSERTION OF MESH  05/18/2012   Procedure: INSERTION OF MESH;  Surgeon: Adin Hector, MD;  Location: [facility];  Service: General;  Laterality: N/A;  . LIVER BIOPSY  05/18/2012   Procedure: LIVER BIOPSY;  Surgeon: Adin Hector, MD;  Location: [facility];  Service: General;;  . POLYPECTOMY    . THYROIDECTOMY, PARTIAL  1980  . TUBAL LIGATION  1972  . VENTRAL HERNIA REPAIR  05/18/2012   Procedure: LAPAROSCOPIC VENTRAL HERNIA;  Surgeon: Adin Hector, MD;  Location: [facility];  Service: General;  Laterality: N/A;  Laparoscopic Ventral Wall Hernia with Mesh    Current Medications: Current Meds  Medication Sig  . ACCU-CHEK AVIVA PLUS test strip USE TO TEST TWICE DAILY  . Accu-Chek FastClix Lancets MISC USE AS DIRECTED TO CHECK BLOOD SUGAR TWICE DAILY  . apixaban (ELIQUIS) 2.5 MG TABS tablet Take 1 tablet (2.5 mg total) by mouth 2 (two) times daily.  . calcium-vitamin D (OSCAL WITH D) 250-125 MG-UNIT per tablet Take 1 tablet by mouth daily.  . Cholecalciferol (VITAMIN D3) 1000 units CAPS Take 1,000 Units by mouth daily.  . fexofenadine (ALLEGRA ALLERGY) 60 MG tablet Take 1 tablet (60 mg total) by mouth 2 (two) times daily.  . fluticasone (FLONASE) 50 MCG/ACT nasal spray Place 1 spray into both nostrils as needed.   . folic acid (FOLVITE) 016 MCG tablet Take 400 mcg by mouth daily.  . hydrALAZINE (APRESOLINE) 100 MG tablet TAKE 1 TABLET(100 MG) BY MOUTH FOUR TIMES DAILY  . Insulin Pen Needle (BD PEN NEEDLE NANO 2ND GEN) 32G X 4 MM MISC Use a new needle each time when checking FBS  . irbesartan (AVAPRO) 300 MG tablet TAKE 1 TABLET BY MOUTH DAILY  . JANUMET 50-1000 MG tablet TAKE 1 TABLET BY MOUTH TWICE DAILY WITH MEALS  . ketoconazole (NIZORAL) 2 % cream Apply 1 application topically daily as  needed. Around skin folds for irritation  . LEVEMIR FLEXTOUCH 100 UNIT/ML FlexPen INJECT 32 UNITS INDER THE SKIN EVERY MORNING AND 18 UNITS WITH SUPPER  . metoprolol tartrate (LOPRESSOR) 100 MG tablet TAKE 1 TABLET(100 MG) BY MOUTH TWICE DAILY  . Multiple Vitamin (MULTIVITAMIN WITH MINERALS) TABS Take 1 tablet by mouth daily.  . pantoprazole (PROTONIX) 40 MG tablet TAKE 1 TABLET BY MOUTH EVERY DAY  . simvastatin (ZOCOR) 40 MG tablet TAKE 1 TABLET BY MOUTH AT BEDTIME  . spironolactone (ALDACTONE) 25 MG tablet Take 25 mg by mouth daily.  Marland Kitchen torsemide (DEMADEX) 20 MG tablet Take 2 tablets (40 mg total) by mouth 2 (two) times daily.  . ursodiol (ACTIGALL) 300 MG capsule TAKE 1 CAPSULE(300 MG) BY MOUTH TWICE DAILY  .  vitamin C (ASCORBIC ACID) 500 MG tablet Take 500 mg by mouth daily.  . vitamin E 400 UNIT capsule Take 400 Units by mouth daily.  . [DISCONTINUED] vitamin B-12 (CYANOCOBALAMIN) 1000 MCG tablet Take 1,000 mcg by mouth daily.     Allergies:   Ace inhibitors, Capoten [captopril], and Neomycin-bacitracin zn-polymyx   Social History   Socioeconomic History  . Marital status: Married    Spouse name: Not on file  . Number of children: 2  . Years of education: Not on file  . Highest education level: Not on file  Occupational History  . Occupation: retired    Fish farm manager: RETIRED  Tobacco Use  . Smoking status: Never Smoker  . Smokeless tobacco: Never Used  Vaping Use  . Vaping Use: Never used  Substance and Sexual Activity  . Alcohol use: No  . Drug use: No  . Sexual activity: Not Currently  Other Topics Concern  . Not on file  Social History Narrative  . Not on file   Social Determinants of Health   Financial Resource Strain: Not on file  Food Insecurity: Not on file  Transportation Needs: Not on file  Physical Activity: Not on file  Stress: Not on file  Social Connections: Not on file     Family History: The patient's family history includes Aneurysm in her sister;  Arrhythmia in an other family member; Arthritis in an other family member; Congestive Heart Failure in her sister and another family member; Diabetes in her brother and sister; Heart attack in her father; Hyperlipidemia in an other family member; Hypertension in an other family member; Kidney failure in an other family member; Stroke in her mother and sister. There is no history of Colon cancer, Esophageal cancer, Stomach cancer, or Rectal cancer.  ROS:   Review of Systems  Constitution: Negative for decreased appetite, fever and weight gain.  HENT: Negative for congestion, ear discharge, hoarse voice and sore throat.   Eyes: Negative for discharge, redness, vision loss in right eye and visual halos.  Cardiovascular: Negative for chest pain, dyspnea on exertion, leg swelling, orthopnea and palpitations.  Respiratory: Negative for cough, hemoptysis, shortness of breath and snoring.   Endocrine: Negative for heat intolerance and polyphagia.  Hematologic/Lymphatic: Negative for bleeding problem. Does not bruise/bleed easily.  Skin: Negative for flushing, nail changes, rash and suspicious lesions.  Musculoskeletal: Negative for arthritis, joint pain, muscle cramps, myalgias, neck pain and stiffness.  Gastrointestinal: Negative for abdominal pain, bowel incontinence, diarrhea and excessive appetite.  Genitourinary: Negative for decreased libido, genital sores and incomplete emptying.  Neurological: Negative for brief paralysis, focal weakness, headaches and loss of balance.  Psychiatric/Behavioral: Negative for altered mental status, depression and suicidal ideas.  Allergic/Immunologic: Negative for HIV exposure and persistent infections.    EKGs/Labs/Other Studies Reviewed:    The following studies were reviewed today:   EKG: None today  Recent Labs: 01/09/2020: NT-Pro BNP 4,436 06/26/2020: Magnesium 1.7 09/10/2020: ALT 14; BUN 69; Creatinine, Ser 2.26; Hemoglobin 9.5; Platelets 212; Potassium  4.4; Sodium 144; TSH 2.020  Recent Lipid Panel    Component Value Date/Time   CHOL 120 09/10/2020 1124   TRIG 70 09/10/2020 1124   HDL 49 09/10/2020 1124   CHOLHDL 2.4 09/10/2020 1124   LDLCALC 56 09/10/2020 1124    Physical Exam:    VS:  BP 136/74   Pulse 80   Ht 5\' 1"  (1.549 m)   Wt 166 lb 8 oz (75.5 kg)   SpO2 98%  BMI 31.46 kg/m     Wt Readings from Last 3 Encounters:  09/25/20 166 lb 8 oz (75.5 kg)  09/12/20 170 lb 6.4 oz (77.3 kg)  08/21/20 166 lb (75.3 kg)     GEN: Well nourished, well developed in no acute distress HEENT: Normal NECK: No JVD; No carotid bruits LYMPHATICS: No lymphadenopathy CARDIAC: S1S2 noted,RRR, no murmurs, rubs, gallops RESPIRATORY:  Clear to auscultation without rales, wheezing or rhonchi  ABDOMEN: Soft, non-tender, non-distended, +bowel sounds, no guarding. EXTREMITIES: No edema, No cyanosis, no clubbing MUSCULOSKELETAL:  No deformity  SKIN: Warm and dry NEUROLOGIC:  Alert and oriented x 3, non-focal PSYCHIATRIC:  Normal affect, good insight  ASSESSMENT:    1. Persistent atrial fibrillation ([place])   2. Hypertension, essential   3. 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 Stage 3 chronic kidney disease, unspecified whether stage 3a or 3b CKD (HCC)   5. Obesity (BMI 30-39.9)    PLAN:     1.  She will remain on her Eliquis 2.5 mg twice a day along with heart rate controlling agents. 2.  Blood pressure is acceptable, continue with current antihypertensive regimen. 3.  Educated patient on possible nephrotoxins to avoid. 4.  The patient understands the need to lose weight with diet and exercise. We have discussed specific strategies for this.  The patient is in agreement with the above plan. The patient left the office in stable condition.  The patient will follow up in   Medication Adjustments/Labs and Tests  Ordered: Current medicines are reviewed at length with the patient today.  Concerns regarding medicines are outlined above.  No orders of the defined types were placed in this encounter.  No orders of the defined types were placed in this encounter.   Patient Instructions  Medication Instructions:  Your physician recommends that you continue on your current medications as directed. Please refer to the Current Medication list given to you today.  *If you need a refill on your cardiac medications before your next appointment, please call your pharmacy*   Lab Work: None If you have labs (blood work) drawn today and your tests are completely normal, you will receive your results only by: Marland Kitchen MyChart Message (if you have MyChart) OR . A paper copy in the mail If you have any lab test that is abnormal or we need to change your treatment, we will call you to review the results.   Testing/Procedures: None   Follow-Up: At [facility], you and your health needs are our priority.  As part of our continuing mission to provide you with exceptional heart care, we have created designated Provider Care Teams.  These Care Teams include your primary Cardiologist (physician) and Advanced Practice Providers (APPs -  Physician Assistants and Nurse Practitioners) who all work together to provide you with the care you need, when you need it.  We recommend signing up for the patient portal called "MyChart".  Sign up information is provided on this After Visit Summary.  MyChart is used to connect with patients for Virtual Visits (Telemedicine).  Patients are able to view lab/test results, encounter notes, upcoming appointments, etc.  Non-urgent messages can be sent to your provider as well.   To learn more about what you can do with MyChart, go to [email or web address].    Your next appointment:   6 month(s)  The format for your next appointment:  In Person  Provider:   Berniece Salines, DO   Other  Instructions      Adopting a Healthy Lifestyle.  Know what a healthy weight is for you (roughly BMI <25) and aim to maintain this   Aim for 7+ servings of fruits and vegetables daily   65-80+ fluid ounces of water or unsweet tea for healthy kidneys   Limit to max 1 drink of alcohol per day; avoid smoking/tobacco   Limit animal fats in diet for cholesterol and heart health - choose grass fed whenever available   Avoid highly processed foods, and foods high in saturated/trans fats   Aim for low stress - take time to unwind and care for your mental health   Aim for 150 min of moderate intensity exercise weekly for heart health, and weights twice weekly for bone health   Aim for 7-9 hours of sleep daily   When it comes to diets, agreement about the perfect plan isnt easy to find, even among the experts. Experts at the [place] developed an idea known as the Healthy Eating Plate. Just imagine a plate divided into logical, healthy portions.   The emphasis is on diet quality:   Load up on vegetables and fruits - one-half of your plate: Aim for color and variety, and remember that potatoes dont count.   Go for whole grains - one-quarter of your plate: Whole wheat, barley, wheat berries, quinoa, oats, brown rice, and foods made with them. If you want pasta, go with whole wheat pasta.   Protein power - one-quarter of your plate: Fish, chicken, beans, and nuts are all healthy, versatile protein sources. Limit red meat.   The diet, however, does go beyond the plate, offering a few other suggestions.   Use healthy plant oils, such as olive, canola, soy, corn, sunflower and peanut. Check the labels, and avoid partially hydrogenated oil, which have unhealthy trans fats.   If youre thirsty, drink water. Coffee and tea are good in moderation, but skip sugary drinks and limit milk and dairy products to one or two daily servings.   The type of carbohydrate in the diet  is more important than the amount. Some sources of carbohydrates, such as vegetables, fruits, whole grains, and beans-are healthier than others.   Finally, stay active  Signed, Berniece Salines, DO  09/25/2020 11:53 AM    [place]

## 2020-09-25 NOTE — Patient Instructions (Signed)

## 2020-10-04 ENCOUNTER — Other Ambulatory Visit: Payer: Self-pay | Admitting: Family Medicine

## 2020-10-17 ENCOUNTER — Ambulatory Visit (INDEPENDENT_AMBULATORY_CARE_PROVIDER_SITE_OTHER): Payer: Medicare Other

## 2020-10-17 DIAGNOSIS — Z23 Encounter for immunization: Secondary | ICD-10-CM | POA: Diagnosis not present

## 2020-10-17 NOTE — Progress Notes (Signed)
   Covid-19 Vaccination Clinic  Name:  Sara Hancock    MRN: [id number] DOB: [birth year]  10/17/2020  [name] was observed post Covid-19 immunization for 15 minutes without incident. She was provided with Vaccine Information Sheet and instruction to access the V-Safe system.   [name] was instructed to call 911 with any severe reactions post vaccine: Marland Kitchen Difficulty breathing  . Swelling of face and throat  . A fast heartbeat  . A bad rash all over body  . Dizziness and weakness   Immunizations Administered    Name Date Dose VIS Date Route   PFIZER Comrnaty(Gray TOP) Covid-19 Vaccine 10/17/2020 10:45 AM 0.3 mL 04/19/2020 Intramuscular   Manufacturer: [employer], [employer]   Lot: [id number]   Painter: [phone]

## 2020-12-11 ENCOUNTER — Other Ambulatory Visit: Payer: Self-pay | Admitting: Family Medicine

## 2020-12-14 ENCOUNTER — Telehealth: Payer: Self-pay | Admitting: Gastroenterology

## 2020-12-14 MED ORDER — URSODIOL 300 MG PO CAPS: ORAL_CAPSULE | ORAL | 0 refills | Status: DC

## 2020-12-14 NOTE — Telephone Encounter (Signed)
Inbound call from patient. Need medication refill for 90 day ursodoil to [facility]

## 2020-12-14 NOTE — Telephone Encounter (Signed)
1 refill sent in. Reviewed last office visit, patient needs to contact the office about having follow up labs and ultrasound.

## 2020-12-17 ENCOUNTER — Other Ambulatory Visit: Payer: Self-pay

## 2020-12-17 ENCOUNTER — Other Ambulatory Visit: Payer: Medicare Other

## 2020-12-17 DIAGNOSIS — E782 Mixed hyperlipidemia: Secondary | ICD-10-CM | POA: Diagnosis not present

## 2020-12-17 DIAGNOSIS — I131 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, with stage 1 through stage 4 chronic kidney disease, or unspecified chronic kidney disease: Secondary | ICD-10-CM

## 2020-12-17 DIAGNOSIS — E119 Type 2 diabetes mellitus without complications: Secondary | ICD-10-CM | POA: Diagnosis not present

## 2020-12-18 LAB — LIPID PANEL
Chol/HDL Ratio: 2.4 ratio (ref 0.0–4.4)
Cholesterol, Total: 111 mg/dL (ref 100–199)
HDL: 46 mg/dL (ref >39)
LDL Chol Calc (NIH): 50 mg/dL (ref 0–99)
Triglycerides: 73 mg/dL (ref 0–149)
VLDL Cholesterol Cal: 15 mg/dL (ref 5–40)

## 2020-12-18 LAB — COMPREHENSIVE METABOLIC PANEL
ALT: 11 IU/L (ref 0–32)
AST: 20 IU/L (ref 0–40)
Albumin/Globulin Ratio: 1.5 (ref 1.2–2.2)
Albumin: 4.1 g/dL (ref 3.6–4.6)
Alkaline Phosphatase: 110 IU/L (ref 44–121)
BUN/Creatinine Ratio: 32 — ABNORMAL HIGH (ref 12–28)
BUN: 71 mg/dL — ABNORMAL HIGH (ref 8–27)
Bilirubin Total: 0.3 mg/dL (ref 0.0–1.2)
CO2: 27 mmol/L (ref 20–29)
Calcium: 10.1 mg/dL (ref 8.7–10.3)
Chloride: 99 mmol/L (ref 96–106)
Creatinine, Ser: 2.23 mg/dL — ABNORMAL HIGH (ref 0.57–1.00)
Globulin, Total: 2.8 g/dL (ref 1.5–4.5)
Glucose: 89 mg/dL (ref 65–99)
Potassium: 4.1 mmol/L (ref 3.5–5.2)
Sodium: 144 mmol/L (ref 134–144)
Total Protein: 6.9 g/dL (ref 6.0–8.5)
eGFR: 21 mL/min/{1.73_m2} — ABNORMAL LOW (ref >59)

## 2020-12-18 LAB — HEMOGLOBIN A1C
Est. average glucose Bld gHb Est-mCnc: 143 mg/dL
Hgb A1c MFr Bld: 6.6 % — ABNORMAL HIGH (ref 4.8–5.6)

## 2020-12-18 LAB — CBC
Hematocrit: 30.8 % — ABNORMAL LOW (ref 34.0–46.6)
Hemoglobin: 9.9 g/dL — ABNORMAL LOW (ref 11.1–15.9)
MCH: 30.9 pg (ref 26.6–33.0)
MCHC: 32.1 g/dL (ref 31.5–35.7)
MCV: 96 fL (ref 79–97)
Platelets: 209 10*3/uL (ref 150–450)
RBC: 3.2 x10E6/uL — ABNORMAL LOW (ref 3.77–5.28)
RDW: 14.3 % (ref 11.7–15.4)
WBC: 6.6 10*3/uL (ref 3.4–10.8)

## 2020-12-18 LAB — CARDIOVASCULAR RISK ASSESSMENT

## 2020-12-19 ENCOUNTER — Encounter: Payer: Self-pay | Admitting: Family Medicine

## 2020-12-20 ENCOUNTER — Ambulatory Visit (INDEPENDENT_AMBULATORY_CARE_PROVIDER_SITE_OTHER): Payer: Medicare Other | Admitting: Legal Medicine

## 2020-12-20 ENCOUNTER — Encounter: Payer: Self-pay | Admitting: Legal Medicine

## 2020-12-20 ENCOUNTER — Other Ambulatory Visit: Payer: Self-pay

## 2020-12-20 VITALS — BP 132/70 | HR 82 | Temp 95.2°F | Ht 61.0 in | Wt 161.0 lb

## 2020-12-20 DIAGNOSIS — I131 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, with stage 1 through stage 4 chronic kidney disease, or unspecified chronic kidney disease: Secondary | ICD-10-CM | POA: Diagnosis not present

## 2020-12-20 DIAGNOSIS — E1122 Type 2 diabetes mellitus with diabetic chronic kidney disease: Secondary | ICD-10-CM

## 2020-12-20 DIAGNOSIS — E1169 Type 2 diabetes mellitus with other specified complication: Secondary | ICD-10-CM

## 2020-12-20 DIAGNOSIS — M810 Age-related osteoporosis without current pathological fracture: Secondary | ICD-10-CM | POA: Diagnosis not present

## 2020-12-20 DIAGNOSIS — E782 Mixed hyperlipidemia: Secondary | ICD-10-CM

## 2020-12-20 DIAGNOSIS — N184 Chronic kidney disease, stage 4 (severe): Secondary | ICD-10-CM

## 2020-12-20 DIAGNOSIS — E119 Type 2 diabetes mellitus without complications: Secondary | ICD-10-CM

## 2020-12-20 DIAGNOSIS — I4821 Permanent atrial fibrillation: Secondary | ICD-10-CM

## 2020-12-20 NOTE — Progress Notes (Signed)
Established Patient Office Visit  Subjective:  Patient ID: Sara Hancock, female    DOB: [birth year]  Age: 86 y.o. MRN: [id number]  CC:  Chief Complaint  Patient presents with   Hypertension   Diabetes   Hyperlipidemia    HPI Diabetes mellitus type 2 without retinopathy ([place]) Takes Levimir 32 units in the morning and 18 units in the evening. She also takes Janumet 50-100 mg bid. She checks her blood sugars twice a day which average 95- 110. She does attempt to check her feet daily and her last annual eye exam was 04/26/2020. 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 She eats healthy and exercises by doing stretching every day. Combined hyperlipidemia associated with type 2 diabetes mellitus ([place]) She takes Zocor 4 mg daily and is eating healthy and exercising Permanent atrial fibrillation (HCC) She takes Eliquis 2.5 mg bid and Metoprolol 100 bid.  Osteoporosis, post-menopausal She takes Oscal with Vitamin D and Prolia CKD stage 4 due to type 2 diabetes mellitus ([place])  Takes Levimir 32 units in the morning and 18 units in the evening. Takes Janumet 50-100 mg bid. She also said she recently switched to Eliquis from Warfarin to help improve kidney function. She follows up with nephrology.  Past Medical History:  Diagnosis Date   Acquired thrombophilia ([place]) 09/11/2019   Allergic rhinitis 09/17/2015   Anemia    ANEMIA, NORMOCYTIC 10/27/2007   Qualifier: Diagnosis of  By: Nils Pyle CMA (AAMA), Leisha     Anticoagulant long-term use 01/08/2011   Arthritis    Benign neoplasm of colon 01/08/2011   Biliary cirrhosis ([place])    BRADYCARDIA 11/15/2008   Qualifier: Diagnosis of  By: Hollie Salk CMA, Amanda     Cellulitis of left foot 09/25/2016   Cholelithiasis  01/09/12   Chronic kidney disease, stage 3 (HCC)    Combined form of age-related cataract, both eyes 02/26/2017   Diabetes mellitus type 2 without retinopathy ([place]) 10/27/2013   Diverticulosis of colon with hemorrhage 06/08/2000   Dyslipidemia 09/17/2015   Encounter for therapeutic drug monitoring 06/09/2013   Esophageal reflux 01/08/2011   Esophageal stenosis 01/08/11   Esophagitis 01/08/11   GALLSTONES 10/26/2008   Qualifier: Diagnosis of  By: Sharlett Iles MD Byrd Hesselbach    Gastritis, erosive chronic 01/08/2011   GERD (gastroesophageal reflux disease)    Glaucoma suspect of both eyes 02/26/2017   Glaucoma suspect of both eyes 02/26/2017   Hiatal hernia 01/08/11   History of IBS 10/27/2007   Qualifier: Diagnosis of  By: Nils Pyle CMA (AAMA), Leisha     HTN (hypertension)    Hx of adenomatous colonic polyps 01/08/11   Hyperplastic colon polyp 06/08/2000, 07/31/2003, 01/08/11   Hypertension, essential 10/27/2007   Qualifier: Diagnosis of  By: Nils Pyle CMA (AAMA), Mearl Latin     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, with stage 1 through stage 4 chronic kidney disease, or unspecified chronic kidney disease    Iron deficiency anemia 01/08/2011   Left foot pain 09/25/2016   Localized edema    Long term (current) use of insulin ([place])    Mixed hyperlipidemia    Nuclear cataract 07/03/2011   Obesity    Obesity (BMI 30-39.9) 03/22/2012   Osteoarthritis of right thumb 09/17/2015   Osteopenia  Other iron deficiency anemias    Other specified menopausal and perimenopausal disorders    PAF (paroxysmal atrial fibrillation) (HCC)    occurring in the setting of bradycardia and mild first degree AV block followed by Dr. Jens Som   Pain in thoracic spine    Paraesophageal hiatal hernia, 3cm by EGD 03/22/2012   Pedal edema 12/13/2019   Persistent atrial fibrillation ([place]) 08/28/2019   Posterior vitreous detachment, bilateral 01/10/2016   Primary biliary cholangitis ([place]) 10/27/2007   Qualifier:  Diagnosis of  By: Nils Pyle CMA (AAMA), Mearl Latin     Pure hypercholesterolemia    Retinal tear, right 07/03/2011   Shortness of breath 12/13/2019   Stage 3b chronic kidney disease ([place]) 09/11/2019   Type II or unspecified type diabetes mellitus without mention of complication, not stated as uncontrolled    Uterine fibroid 01/09/12    Past Surgical History:  Procedure Laterality Date   BREAST CYST EXCISION     left   CARDIOVERSION N/A 11/27/2017   Procedure: CARDIOVERSION;  Surgeon: Sanda Klein, MD;  Location: [facility];  Service: Cardiovascular;  Laterality: N/A;   COLONOSCOPY     INSERTION OF MESH  05/18/2012   Procedure: INSERTION OF MESH;  Surgeon: Adin Hector, MD;  Location: [facility];  Service: General;  Laterality: N/A;   LIVER BIOPSY  05/18/2012   Procedure: LIVER BIOPSY;  Surgeon: Adin Hector, MD;  Location: [facility];  Service: General;;   POLYPECTOMY     THYROIDECTOMY, [place]  05/18/2012   Procedure: LAPAROSCOPIC VENTRAL HERNIA;  Surgeon: Adin Hector, MD;  Location: [facility];  Service: General;  Laterality: N/A;  Laparoscopic Ventral Wall Hernia with Mesh    Family History  Problem Relation Age of Onset   Heart attack Father    Stroke Mother    Diabetes Sister        x 2   Aneurysm Sister    Stroke Sister    Congestive Heart Failure Sister    Diabetes Brother    Kidney failure Other    Hyperlipidemia Other    Arrhythmia Other    Congestive Heart Failure Other    Hypertension Other    Arthritis Other    Colon cancer Neg Hx    Esophageal cancer Neg Hx    Stomach cancer Neg Hx    Rectal cancer Neg Hx     Social History   Socioeconomic History   Marital status: Married    Spouse name: Not on file   Number of children: 2   Years of education: Not on file   Highest education level: Not on file  Occupational History   Occupation: retired    Fish farm manager: RETIRED  Tobacco Use   Smoking status: Never   Smokeless  tobacco: Never  Vaping Use   Vaping Use: Never used  Substance and Sexual Activity   Alcohol use: No   Drug use: No   Sexual activity: Not Currently  Other Topics Concern   Not on file  Social History Narrative   Not on file   Social Determinants of Health   Financial Resource Strain: Not on file  Food Insecurity: Not on file  Transportation Needs: Not on file  Physical Activity: Not on file  Stress: Not on file  Social Connections: Not on file  Intimate Partner Violence: Not on file    Outpatient Medications Prior to Visit  Medication Sig Dispense Refill  ACCU-CHEK AVIVA PLUS test strip USE TO TEST TWICE DAILY 200 strip 3   Accu-Chek FastClix Lancets MISC USE TO CHECK BLOOD SUGAR TWICE DAILY 204 each 3   apixaban (ELIQUIS) 2.5 MG TABS tablet Take 1 tablet (2.5 mg total) by mouth 2 (two) times daily. 180 tablet 1   calcium-vitamin D (OSCAL WITH D) 250-125 MG-UNIT per tablet Take 1 tablet by mouth daily.     Cholecalciferol (VITAMIN D3) 1000 units CAPS Take 1,000 Units by mouth daily.     fexofenadine (ALLEGRA ALLERGY) 60 MG tablet Take 1 tablet (60 mg total) by mouth 2 (two) times daily. 180 tablet 1   fluticasone (FLONASE) 50 MCG/ACT nasal spray Place 1 spray into both nostrils as needed.      folic acid (FOLVITE) 277 MCG tablet Take 400 mcg by mouth daily.     hydrALAZINE (APRESOLINE) 100 MG tablet TAKE 1 TABLET(100 MG) BY MOUTH FOUR TIMES DAILY 120 tablet 2   Insulin Pen Needle (BD PEN NEEDLE NANO 2ND GEN) 32G X 4 MM MISC Use a new needle each time when checking FBS 200 each 3   irbesartan (AVAPRO) 300 MG tablet TAKE 1 TABLET BY MOUTH DAILY 90 tablet 1   JANUMET 50-1000 MG tablet TAKE 1 TABLET BY MOUTH TWICE DAILY WITH MEALS 180 tablet 1   LEVEMIR FLEXTOUCH 100 UNIT/ML FlexPen INJECT 32 UNITS INDER THE SKIN EVERY MORNING AND 18 UNITS WITH SUPPER 45 mL 1   metoprolol tartrate (LOPRESSOR) 100 MG tablet TAKE 1 TABLET(100 MG) BY MOUTH TWICE DAILY 180 tablet 3   Multiple Vitamin  (MULTIVITAMIN WITH MINERALS) TABS Take 1 tablet by mouth daily.     nystatin cream (MYCOSTATIN) APPLY SUFFICIENT AMOUNT EXTERNALLY TO THE AFFECTED AREA TWICE DAILY 30 g 1   pantoprazole (PROTONIX) 40 MG tablet TAKE 1 TABLET BY MOUTH EVERY DAY 90 tablet 3   simvastatin (ZOCOR) 40 MG tablet TAKE 1 TABLET BY MOUTH AT BEDTIME 90 tablet 1   spironolactone (ALDACTONE) 25 MG tablet Take 25 mg by mouth daily.     torsemide (DEMADEX) 20 MG tablet Take 2 tablets (40 mg total) by mouth 2 (two) times daily. 360 tablet 3   ursodiol (ACTIGALL) 300 MG capsule TAKE 1 CAPSULE(300 MG) BY MOUTH TWICE DAILY** CONTACT OFFICE TO SCHEDULE APPOINTMENTS** 180 capsule 0   vitamin C (ASCORBIC ACID) 500 MG tablet Take 500 mg by mouth daily.     vitamin E 400 UNIT capsule Take 400 Units by mouth daily.     ketoconazole (NIZORAL) 2 % cream Apply 1 application topically daily as needed. Around skin folds for irritation     No facility-administered medications prior to visit.    Allergies  Allergen Reactions   Ace Inhibitors Anaphylaxis   Capoten [Captopril] Anaphylaxis    Throat swells shut   Neomycin-Bacitracin Zn-Polymyx Rash    ROS Review of Systems  Constitutional:  Negative for chills and fatigue.  HENT:  Positive for congestion (allergies). Negative for sinus pressure and sore throat.   Eyes:  Negative for visual disturbance.  Respiratory:  Negative for cough and shortness of breath.   Cardiovascular:  Negative for chest pain, palpitations and leg swelling.  Gastrointestinal:  Negative for abdominal pain, constipation, diarrhea, nausea and vomiting.  Endocrine: Negative for polydipsia, polyphagia and polyuria.  Genitourinary:  Negative for difficulty urinating, frequency and urgency.  Musculoskeletal:  Positive for back pain (chronic back pain).  Skin:  Negative for rash.  Neurological:  Negative for dizziness, weakness and numbness.  Psychiatric/Behavioral:  The patient is not nervous/anxious.       Objective:    Physical Exam Vitals reviewed.  Constitutional:      Appearance: Normal appearance.  HENT:     Head: Normocephalic.     Right Ear: Tympanic membrane, ear canal and external ear normal.     Left Ear: Tympanic membrane, ear canal and external ear normal.     Nose: Congestion present.     Mouth/Throat:     Mouth: Mucous membranes are moist.     Pharynx: Oropharynx is clear.  Cardiovascular:     Rate and Rhythm: Normal rate. Rhythm irregular.     Pulses: Normal pulses.     Heart sounds: Normal heart sounds.  Pulmonary:     Effort: Pulmonary effort is normal.     Breath sounds: Normal breath sounds.  Abdominal:     General: Abdomen is flat. Bowel sounds are normal.     Palpations: Abdomen is soft.  Skin:    General: Skin is warm and dry.  Neurological:     General: No focal deficit present.     Mental Status: She is alert and oriented to person, place, and time. Mental status is at baseline.  Psychiatric:        Mood and Affect: Mood normal.        Behavior: Behavior normal.   Diabetic foot exam was performed with the following findings:   No deformities, ulcerations, or other skin breakdown Normal sensation of 10g monofilament Intact posterior tibialis and dorsalis pedis pulses          BP 132/70   Pulse 82   Temp (!) 95.2 F (35.1 C)   Ht 5' 1"  (1.549 m)   Wt 161 lb (73 kg)   SpO2 99%   BMI 30.42 kg/m  Wt Readings from Last 3 Encounters:  12/20/20 161 lb (73 kg)  09/25/20 166 lb 8 oz (75.5 kg)  09/12/20 170 lb 6.4 oz (77.3 kg)     Health Maintenance Due  Topic Date Due   INFLUENZA VACCINE  12/10/2020    There are no preventive care reminders to display for this patient.  Lab Results  Component Value Date   TSH 2.020 09/10/2020   Lab Results  Component Value Date   WBC 6.6 12/17/2020   HGB 9.9 (L) 12/17/2020   HCT 30.8 (L) 12/17/2020   MCV 96 12/17/2020   PLT 209 12/17/2020   Lab Results  Component Value Date   NA 144  12/17/2020   K 4.1 12/17/2020   CO2 27 12/17/2020   GLUCOSE 89 12/17/2020   BUN 71 (H) 12/17/2020   CREATININE 2.23 (H) 12/17/2020   BILITOT 0.3 12/17/2020   ALKPHOS 110 12/17/2020   AST 20 12/17/2020   ALT 11 12/17/2020   PROT 6.9 12/17/2020   ALBUMIN 4.1 12/17/2020   CALCIUM 10.1 12/17/2020   EGFR 21 (L) 12/17/2020   GFR 25.57 (L) 05/17/2020   Lab Results  Component Value Date   CHOL 111 12/17/2020   Lab Results  Component Value Date   HDL 46 12/17/2020   Lab Results  Component Value Date   LDLCALC 50 12/17/2020   Lab Results  Component Value Date   TRIG 73 12/17/2020   Lab Results  Component Value Date   CHOLHDL 2.4 12/17/2020   Lab Results  Component Value Date   HGBA1C 6.6 (H) 12/17/2020      Assessment & Plan:  1. Diabetes mellitus type 2  without retinopathy ([place]) - Well controlled - Continue Levimir 32 units in the morning and 18 units in the evening and Janumet 50-100 mg bid.  - Continue to eat healthy and stay active 2. 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 Combined hyperlipidemia associated with type 2 diabetes mellitus ([place]) - Well controlled - Continue Zocor 40 mg daily - Continue eating healthy and exercising  4. Permanent atrial fibrillation (HCC) - Well controlled - Continue Eliquis 2.5 mg bid - Continue Lopressor 100 mg bid 5. Osteoporosis, post-menopausal - Controlled - Continue Oscal and Prolio 6. CKD stage 4 due to type 2 diabetes mellitus ([place])  - Controlled - Continue follow-up with nephrology  Follow-up: Return in about 3 months (around 03/22/2021) for fasting.    Gayleen Orem, RN

## 2021-01-07 ENCOUNTER — Telehealth: Payer: Self-pay | Admitting: Gastroenterology

## 2021-01-07 DIAGNOSIS — K7469 Other cirrhosis of liver: Secondary | ICD-10-CM

## 2021-01-07 DIAGNOSIS — K743 Primary biliary cirrhosis: Secondary | ICD-10-CM

## 2021-01-07 NOTE — Telephone Encounter (Signed)
Patient had recent labs on 12/17/2020. Do you need her to have any other labs other than what she has recently had drawn? I will get her scheduled for her RUQ [place].

## 2021-01-07 NOTE — Telephone Encounter (Signed)
Inbound call from patient requesting a call to schedule follow up labs and ultrasound please.

## 2021-01-07 NOTE — Telephone Encounter (Signed)
CBC, CMP from 8/8 reviewed. Both stable.  Obtain AFP and RUQ [place].

## 2021-01-08 NOTE — Telephone Encounter (Signed)
AFP has been ordered along with RUQ abdominal US. [place] has been scheduled for 01/11/2021 at 9:30 NPO midnight.

## 2021-01-08 NOTE — Telephone Encounter (Signed)
Left patient message to call the office about ultrasound info.

## 2021-01-09 NOTE — Telephone Encounter (Signed)
Patient has returned phone call and has been informed to go to [facility] this Friday for her [place]. She is aware to check in at 9:15, NPO midnight. She will have her lab either before or after her [place].

## 2021-01-10 DIAGNOSIS — L570 Actinic keratosis: Secondary | ICD-10-CM | POA: Diagnosis not present

## 2021-01-10 DIAGNOSIS — D2272 Melanocytic nevi of left lower limb, including hip: Secondary | ICD-10-CM | POA: Diagnosis not present

## 2021-01-10 DIAGNOSIS — Z86018 Personal history of other benign neoplasm: Secondary | ICD-10-CM | POA: Diagnosis not present

## 2021-01-10 DIAGNOSIS — D2230 Melanocytic nevi of unspecified part of face: Secondary | ICD-10-CM | POA: Diagnosis not present

## 2021-01-10 DIAGNOSIS — L821 Other seborrheic keratosis: Secondary | ICD-10-CM | POA: Diagnosis not present

## 2021-01-10 DIAGNOSIS — Z85828 Personal history of other malignant neoplasm of skin: Secondary | ICD-10-CM | POA: Diagnosis not present

## 2021-01-10 DIAGNOSIS — D235 Other benign neoplasm of skin of trunk: Secondary | ICD-10-CM | POA: Diagnosis not present

## 2021-01-10 DIAGNOSIS — I872 Venous insufficiency (chronic) (peripheral): Secondary | ICD-10-CM | POA: Diagnosis not present

## 2021-01-10 DIAGNOSIS — L578 Other skin changes due to chronic exposure to nonionizing radiation: Secondary | ICD-10-CM | POA: Diagnosis not present

## 2021-01-10 DIAGNOSIS — D225 Melanocytic nevi of trunk: Secondary | ICD-10-CM | POA: Diagnosis not present

## 2021-01-11 ENCOUNTER — Ambulatory Visit (HOSPITAL_COMMUNITY): Payer: Medicare Other

## 2021-01-11 ENCOUNTER — Other Ambulatory Visit: Payer: Self-pay | Admitting: Cardiology

## 2021-01-11 ENCOUNTER — Other Ambulatory Visit: Payer: Self-pay | Admitting: Family Medicine

## 2021-01-11 NOTE — Telephone Encounter (Signed)
Torsemide 20 mg # 360 tablets only, patient has recall appt due in November 2022

## 2021-01-16 DIAGNOSIS — Z9842 Cataract extraction status, left eye: Secondary | ICD-10-CM | POA: Diagnosis not present

## 2021-01-16 DIAGNOSIS — H26493 Other secondary cataract, bilateral: Secondary | ICD-10-CM | POA: Diagnosis not present

## 2021-01-16 DIAGNOSIS — Z9841 Cataract extraction status, right eye: Secondary | ICD-10-CM | POA: Diagnosis not present

## 2021-01-16 DIAGNOSIS — H43813 Vitreous degeneration, bilateral: Secondary | ICD-10-CM | POA: Diagnosis not present

## 2021-01-16 DIAGNOSIS — Z961 Presence of intraocular lens: Secondary | ICD-10-CM | POA: Diagnosis not present

## 2021-01-21 ENCOUNTER — Other Ambulatory Visit: Payer: Medicare Other

## 2021-01-21 ENCOUNTER — Ambulatory Visit (HOSPITAL_COMMUNITY)
Admission: RE | Admit: 2021-01-21 | Discharge: 2021-01-21 | Disposition: A | Discharge status: Home / Self Care | Payer: Medicare Other | Source: Ambulatory Visit | Attending: Gastroenterology | Admitting: Gastroenterology

## 2021-01-21 ENCOUNTER — Other Ambulatory Visit: Payer: Self-pay

## 2021-01-21 DIAGNOSIS — K7469 Other cirrhosis of liver: Secondary | ICD-10-CM

## 2021-01-21 DIAGNOSIS — K743 Primary biliary cirrhosis: Secondary | ICD-10-CM | POA: Insufficient documentation

## 2021-01-21 DIAGNOSIS — K8020 Calculus of gallbladder without cholecystitis without obstruction: Secondary | ICD-10-CM | POA: Diagnosis not present

## 2021-01-22 ENCOUNTER — Other Ambulatory Visit: Payer: Self-pay | Admitting: Family Medicine

## 2021-01-22 ENCOUNTER — Other Ambulatory Visit: Payer: Self-pay

## 2021-01-22 DIAGNOSIS — R9389 Abnormal findings on diagnostic imaging of other specified body structures: Secondary | ICD-10-CM

## 2021-01-22 DIAGNOSIS — K743 Primary biliary cirrhosis: Secondary | ICD-10-CM

## 2021-01-22 LAB — AFP TUMOR MARKER: AFP-Tumor Marker: 1.3 ng/mL

## 2021-01-22 NOTE — Progress Notes (Signed)
Cbc

## 2021-01-23 ENCOUNTER — Other Ambulatory Visit: Payer: Self-pay

## 2021-01-23 DIAGNOSIS — K2100 Gastro-esophageal reflux disease with esophagitis, without bleeding: Secondary | ICD-10-CM

## 2021-01-23 DIAGNOSIS — R932 Abnormal findings on diagnostic imaging of liver and biliary tract: Secondary | ICD-10-CM

## 2021-01-23 DIAGNOSIS — K745 Biliary cirrhosis, unspecified: Secondary | ICD-10-CM

## 2021-01-23 DIAGNOSIS — D509 Iron deficiency anemia, unspecified: Secondary | ICD-10-CM

## 2021-01-28 ENCOUNTER — Other Ambulatory Visit: Payer: Self-pay

## 2021-01-28 DIAGNOSIS — N184 Chronic kidney disease, stage 4 (severe): Secondary | ICD-10-CM | POA: Diagnosis not present

## 2021-01-29 LAB — RENAL FUNCTION PANEL
Albumin: 4 g/dL (ref 3.6–4.6)
BUN/Creatinine Ratio: 31 — ABNORMAL HIGH (ref 12–28)
BUN: 61 mg/dL — ABNORMAL HIGH (ref 8–27)
CO2: 23 mmol/L (ref 20–29)
Calcium: 9.5 mg/dL (ref 8.7–10.3)
Chloride: 99 mmol/L (ref 96–106)
Creatinine, Ser: 1.98 mg/dL — ABNORMAL HIGH (ref 0.57–1.00)
Glucose: 94 mg/dL (ref 65–99)
Phosphorus: 4 mg/dL (ref 3.0–4.3)
Potassium: 3.9 mmol/L (ref 3.5–5.2)
Sodium: 142 mmol/L (ref 134–144)
eGFR: 24 mL/min/{1.73_m2} — ABNORMAL LOW (ref >59)

## 2021-01-29 LAB — CBC WITH DIFFERENTIAL/PLATELET
Basophils Absolute: 0 10*3/uL (ref 0.0–0.2)
Basos: 0 %
EOS (ABSOLUTE): 0 10*3/uL (ref 0.0–0.4)
Eos: 1 %
Hematocrit: 28.3 % — ABNORMAL LOW (ref 34.0–46.6)
Hemoglobin: 9.2 g/dL — ABNORMAL LOW (ref 11.1–15.9)
Immature Grans (Abs): 0 10*3/uL (ref 0.0–0.1)
Immature Granulocytes: 0 %
Lymphocytes Absolute: 0.9 10*3/uL (ref 0.7–3.1)
Lymphs: 12 %
MCH: 31.1 pg (ref 26.6–33.0)
MCHC: 32.5 g/dL (ref 31.5–35.7)
MCV: 96 fL (ref 79–97)
Monocytes Absolute: 0.6 10*3/uL (ref 0.1–0.9)
Monocytes: 9 %
Neutrophils Absolute: 5.8 10*3/uL (ref 1.4–7.0)
Neutrophils: 78 %
Platelets: 264 10*3/uL (ref 150–450)
RBC: 2.96 x10E6/uL — ABNORMAL LOW (ref 3.77–5.28)
RDW: 14.3 % (ref 11.7–15.4)
WBC: 7.4 10*3/uL (ref 3.4–10.8)

## 2021-01-29 LAB — PARATHYROID HORMONE, INTACT (NO CA): PTH: 41 pg/mL (ref 15–65)

## 2021-01-29 LAB — IRON AND TIBC
Iron Saturation: 14 % — ABNORMAL LOW (ref 15–55)
Iron: 39 ug/dL (ref 27–139)
Total Iron Binding Capacity: 279 ug/dL (ref 250–450)
UIBC: 240 ug/dL (ref 118–369)

## 2021-01-29 LAB — MICROALBUMIN / CREATININE URINE RATIO
Creatinine, Urine: 43.3 mg/dL
Microalb/Creat Ratio: 56 mg/g creat — ABNORMAL HIGH (ref 0–29)
Microalbumin, Urine: 24.4 ug/mL

## 2021-01-29 LAB — FERRITIN: Ferritin: 79 ng/mL (ref 15–150)

## 2021-02-04 ENCOUNTER — Ambulatory Visit: Payer: Medicare Other

## 2021-02-04 DIAGNOSIS — Z23 Encounter for immunization: Secondary | ICD-10-CM

## 2021-02-04 NOTE — Progress Notes (Signed)
   Covid-[facility] Vaccination [facility]  Name:  CASSIDY TASHIRO    MRN: [id number] DOB: [birth year]  02/04/2021  Ms. Scaife was observed post Covid-19 immunization for 15 minutes without incident. She was provided with Vaccine Information Sheet and instruction to access the V-Safe system.   Ms. Randal was instructed to call 911 with any severe reactions post vaccine: Difficulty breathing  Swelling of face and throat  A fast heartbeat  A bad rash all over body  Dizziness and weakness

## 2021-02-05 ENCOUNTER — Ambulatory Visit: Payer: Medicare Other | Admitting: Gastroenterology

## 2021-02-05 DIAGNOSIS — N184 Chronic kidney disease, stage 4 (severe): Secondary | ICD-10-CM | POA: Diagnosis not present

## 2021-02-05 DIAGNOSIS — D631 Anemia in chronic kidney disease: Secondary | ICD-10-CM | POA: Diagnosis not present

## 2021-02-05 DIAGNOSIS — E1122 Type 2 diabetes mellitus with diabetic chronic kidney disease: Secondary | ICD-10-CM | POA: Diagnosis not present

## 2021-02-05 DIAGNOSIS — I480 Paroxysmal atrial fibrillation: Secondary | ICD-10-CM | POA: Diagnosis not present

## 2021-02-05 DIAGNOSIS — I129 Hypertensive chronic kidney disease with stage 1 through stage 4 chronic kidney disease, or unspecified chronic kidney disease: Secondary | ICD-10-CM | POA: Diagnosis not present

## 2021-02-05 DIAGNOSIS — E785 Hyperlipidemia, unspecified: Secondary | ICD-10-CM | POA: Diagnosis not present

## 2021-02-09 DIAGNOSIS — D259 Leiomyoma of uterus, unspecified: Secondary | ICD-10-CM | POA: Diagnosis not present

## 2021-02-09 DIAGNOSIS — R112 Nausea with vomiting, unspecified: Secondary | ICD-10-CM | POA: Diagnosis not present

## 2021-02-09 DIAGNOSIS — Z20822 Contact with and (suspected) exposure to covid-19: Secondary | ICD-10-CM | POA: Diagnosis not present

## 2021-02-09 DIAGNOSIS — E11649 Type 2 diabetes mellitus with hypoglycemia without coma: Secondary | ICD-10-CM | POA: Diagnosis not present

## 2021-02-09 DIAGNOSIS — N179 Acute kidney failure, unspecified: Secondary | ICD-10-CM | POA: Diagnosis not present

## 2021-02-09 DIAGNOSIS — K7469 Other cirrhosis of liver: Secondary | ICD-10-CM | POA: Diagnosis not present

## 2021-02-09 DIAGNOSIS — R197 Diarrhea, unspecified: Secondary | ICD-10-CM | POA: Diagnosis not present

## 2021-02-09 DIAGNOSIS — R531 Weakness: Secondary | ICD-10-CM | POA: Diagnosis not present

## 2021-02-09 DIAGNOSIS — E875 Hyperkalemia: Secondary | ICD-10-CM | POA: Diagnosis not present

## 2021-02-09 DIAGNOSIS — K8020 Calculus of gallbladder without cholecystitis without obstruction: Secondary | ICD-10-CM | POA: Diagnosis not present

## 2021-02-09 DIAGNOSIS — Z79899 Other long term (current) drug therapy: Secondary | ICD-10-CM | POA: Diagnosis not present

## 2021-02-11 DIAGNOSIS — Z79891 Long term (current) use of opiate analgesic: Secondary | ICD-10-CM | POA: Diagnosis not present

## 2021-02-11 DIAGNOSIS — I959 Hypotension, unspecified: Secondary | ICD-10-CM | POA: Diagnosis not present

## 2021-02-11 DIAGNOSIS — I480 Paroxysmal atrial fibrillation: Secondary | ICD-10-CM | POA: Diagnosis present

## 2021-02-11 DIAGNOSIS — E1122 Type 2 diabetes mellitus with diabetic chronic kidney disease: Secondary | ICD-10-CM | POA: Diagnosis present

## 2021-02-11 DIAGNOSIS — Z741 Need for assistance with personal care: Secondary | ICD-10-CM | POA: Diagnosis not present

## 2021-02-11 DIAGNOSIS — Z7984 Long term (current) use of oral hypoglycemic drugs: Secondary | ICD-10-CM | POA: Diagnosis not present

## 2021-02-11 DIAGNOSIS — R0981 Nasal congestion: Secondary | ICD-10-CM | POA: Diagnosis not present

## 2021-02-11 DIAGNOSIS — N170 Acute kidney failure with tubular necrosis: Secondary | ICD-10-CM | POA: Diagnosis present

## 2021-02-11 DIAGNOSIS — I517 Cardiomegaly: Secondary | ICD-10-CM | POA: Diagnosis not present

## 2021-02-11 DIAGNOSIS — E8721 Acute metabolic acidosis: Secondary | ICD-10-CM | POA: Diagnosis not present

## 2021-02-11 DIAGNOSIS — R2689 Other abnormalities of gait and mobility: Secondary | ICD-10-CM | POA: Diagnosis not present

## 2021-02-11 DIAGNOSIS — Z9981 Dependence on supplemental oxygen: Secondary | ICD-10-CM | POA: Diagnosis not present

## 2021-02-11 DIAGNOSIS — U071 COVID-19: Secondary | ICD-10-CM | POA: Diagnosis not present

## 2021-02-11 DIAGNOSIS — R5381 Other malaise: Secondary | ICD-10-CM | POA: Diagnosis not present

## 2021-02-11 DIAGNOSIS — I509 Heart failure, unspecified: Secondary | ICD-10-CM | POA: Diagnosis not present

## 2021-02-11 DIAGNOSIS — Z7409 Other reduced mobility: Secondary | ICD-10-CM | POA: Diagnosis not present

## 2021-02-11 DIAGNOSIS — E669 Obesity, unspecified: Secondary | ICD-10-CM | POA: Diagnosis present

## 2021-02-11 DIAGNOSIS — I361 Nonrheumatic tricuspid (valve) insufficiency: Secondary | ICD-10-CM | POA: Diagnosis not present

## 2021-02-11 DIAGNOSIS — N179 Acute kidney failure, unspecified: Secondary | ICD-10-CM | POA: Diagnosis not present

## 2021-02-11 DIAGNOSIS — R0902 Hypoxemia: Secondary | ICD-10-CM | POA: Diagnosis not present

## 2021-02-11 DIAGNOSIS — R52 Pain, unspecified: Secondary | ICD-10-CM | POA: Diagnosis not present

## 2021-02-11 DIAGNOSIS — I5033 Acute on chronic diastolic (congestive) heart failure: Secondary | ICD-10-CM | POA: Diagnosis present

## 2021-02-11 DIAGNOSIS — I10 Essential (primary) hypertension: Secondary | ICD-10-CM | POA: Diagnosis not present

## 2021-02-11 DIAGNOSIS — Z6835 Body mass index (BMI) 35.0-35.9, adult: Secondary | ICD-10-CM | POA: Diagnosis not present

## 2021-02-11 DIAGNOSIS — E119 Type 2 diabetes mellitus without complications: Secondary | ICD-10-CM | POA: Diagnosis not present

## 2021-02-11 DIAGNOSIS — J811 Chronic pulmonary edema: Secondary | ICD-10-CM | POA: Diagnosis not present

## 2021-02-11 DIAGNOSIS — D631 Anemia in chronic kidney disease: Secondary | ICD-10-CM | POA: Diagnosis not present

## 2021-02-11 DIAGNOSIS — J90 Pleural effusion, not elsewhere classified: Secondary | ICD-10-CM | POA: Diagnosis not present

## 2021-02-11 DIAGNOSIS — I130 Hypertensive heart and chronic kidney disease with heart failure and stage 1 through stage 4 chronic kidney disease, or unspecified chronic kidney disease: Secondary | ICD-10-CM | POA: Diagnosis present

## 2021-02-11 DIAGNOSIS — R0602 Shortness of breath: Secondary | ICD-10-CM | POA: Diagnosis not present

## 2021-02-11 DIAGNOSIS — Z7983 Long term (current) use of bisphosphonates: Secondary | ICD-10-CM | POA: Diagnosis not present

## 2021-02-11 DIAGNOSIS — Z79899 Other long term (current) drug therapy: Secondary | ICD-10-CM | POA: Diagnosis not present

## 2021-02-11 DIAGNOSIS — E875 Hyperkalemia: Secondary | ICD-10-CM | POA: Diagnosis present

## 2021-02-11 DIAGNOSIS — E8729 Other acidosis: Secondary | ICD-10-CM | POA: Diagnosis not present

## 2021-02-11 DIAGNOSIS — I340 Nonrheumatic mitral (valve) insufficiency: Secondary | ICD-10-CM | POA: Diagnosis not present

## 2021-02-11 DIAGNOSIS — N189 Chronic kidney disease, unspecified: Secondary | ICD-10-CM | POA: Diagnosis not present

## 2021-02-11 DIAGNOSIS — Z602 Problems related to living alone: Secondary | ICD-10-CM | POA: Diagnosis present

## 2021-02-11 DIAGNOSIS — Z7902 Long term (current) use of antithrombotics/antiplatelets: Secondary | ICD-10-CM | POA: Diagnosis not present

## 2021-02-11 DIAGNOSIS — N184 Chronic kidney disease, stage 4 (severe): Secondary | ICD-10-CM | POA: Diagnosis present

## 2021-02-11 DIAGNOSIS — R34 Anuria and oliguria: Secondary | ICD-10-CM | POA: Diagnosis not present

## 2021-02-11 DIAGNOSIS — R262 Difficulty in walking, not elsewhere classified: Secondary | ICD-10-CM | POA: Diagnosis not present

## 2021-02-11 DIAGNOSIS — N390 Urinary tract infection, site not specified: Secondary | ICD-10-CM | POA: Diagnosis not present

## 2021-02-11 DIAGNOSIS — E1165 Type 2 diabetes mellitus with hyperglycemia: Secondary | ICD-10-CM | POA: Diagnosis not present

## 2021-02-11 DIAGNOSIS — R112 Nausea with vomiting, unspecified: Secondary | ICD-10-CM | POA: Diagnosis not present

## 2021-02-11 DIAGNOSIS — Z20822 Contact with and (suspected) exposure to covid-19: Secondary | ICD-10-CM | POA: Diagnosis present

## 2021-02-11 DIAGNOSIS — E785 Hyperlipidemia, unspecified: Secondary | ICD-10-CM | POA: Diagnosis present

## 2021-02-11 DIAGNOSIS — D649 Anemia, unspecified: Secondary | ICD-10-CM | POA: Diagnosis present

## 2021-02-11 DIAGNOSIS — I110 Hypertensive heart disease with heart failure: Secondary | ICD-10-CM | POA: Diagnosis not present

## 2021-02-11 DIAGNOSIS — E872 Acidosis, unspecified: Secondary | ICD-10-CM | POA: Diagnosis present

## 2021-02-11 DIAGNOSIS — R531 Weakness: Secondary | ICD-10-CM | POA: Diagnosis not present

## 2021-02-11 DIAGNOSIS — Z794 Long term (current) use of insulin: Secondary | ICD-10-CM | POA: Diagnosis not present

## 2021-02-11 DIAGNOSIS — E7849 Other hyperlipidemia: Secondary | ICD-10-CM | POA: Diagnosis not present

## 2021-02-15 DIAGNOSIS — N390 Urinary tract infection, site not specified: Secondary | ICD-10-CM | POA: Diagnosis not present

## 2021-02-15 DIAGNOSIS — E872 Acidosis, unspecified: Secondary | ICD-10-CM | POA: Diagnosis not present

## 2021-02-15 DIAGNOSIS — Z9981 Dependence on supplemental oxygen: Secondary | ICD-10-CM | POA: Diagnosis not present

## 2021-02-15 DIAGNOSIS — R2689 Other abnormalities of gait and mobility: Secondary | ICD-10-CM | POA: Diagnosis not present

## 2021-02-15 DIAGNOSIS — I509 Heart failure, unspecified: Secondary | ICD-10-CM | POA: Diagnosis not present

## 2021-02-15 DIAGNOSIS — N184 Chronic kidney disease, stage 4 (severe): Secondary | ICD-10-CM | POA: Diagnosis not present

## 2021-02-15 DIAGNOSIS — I129 Hypertensive chronic kidney disease with stage 1 through stage 4 chronic kidney disease, or unspecified chronic kidney disease: Secondary | ICD-10-CM | POA: Diagnosis not present

## 2021-02-15 DIAGNOSIS — E860 Dehydration: Secondary | ICD-10-CM | POA: Diagnosis not present

## 2021-02-15 DIAGNOSIS — E8729 Other acidosis: Secondary | ICD-10-CM | POA: Diagnosis not present

## 2021-02-15 DIAGNOSIS — N170 Acute kidney failure with tubular necrosis: Secondary | ICD-10-CM | POA: Diagnosis not present

## 2021-02-15 DIAGNOSIS — I10 Essential (primary) hypertension: Secondary | ICD-10-CM | POA: Diagnosis not present

## 2021-02-15 DIAGNOSIS — E1122 Type 2 diabetes mellitus with diabetic chronic kidney disease: Secondary | ICD-10-CM | POA: Diagnosis not present

## 2021-02-15 DIAGNOSIS — N189 Chronic kidney disease, unspecified: Secondary | ICD-10-CM | POA: Diagnosis not present

## 2021-02-15 DIAGNOSIS — E669 Obesity, unspecified: Secondary | ICD-10-CM | POA: Diagnosis not present

## 2021-02-15 DIAGNOSIS — E119 Type 2 diabetes mellitus without complications: Secondary | ICD-10-CM | POA: Diagnosis not present

## 2021-02-15 DIAGNOSIS — R7989 Other specified abnormal findings of blood chemistry: Secondary | ICD-10-CM | POA: Diagnosis not present

## 2021-02-15 DIAGNOSIS — Z741 Need for assistance with personal care: Secondary | ICD-10-CM | POA: Diagnosis not present

## 2021-02-15 DIAGNOSIS — I480 Paroxysmal atrial fibrillation: Secondary | ICD-10-CM | POA: Diagnosis not present

## 2021-02-15 DIAGNOSIS — Z7409 Other reduced mobility: Secondary | ICD-10-CM | POA: Diagnosis not present

## 2021-02-15 DIAGNOSIS — R34 Anuria and oliguria: Secondary | ICD-10-CM | POA: Diagnosis not present

## 2021-02-15 DIAGNOSIS — U071 COVID-19: Secondary | ICD-10-CM | POA: Diagnosis not present

## 2021-02-15 DIAGNOSIS — E785 Hyperlipidemia, unspecified: Secondary | ICD-10-CM | POA: Diagnosis not present

## 2021-02-15 DIAGNOSIS — R5381 Other malaise: Secondary | ICD-10-CM | POA: Diagnosis not present

## 2021-02-15 DIAGNOSIS — R531 Weakness: Secondary | ICD-10-CM | POA: Diagnosis not present

## 2021-02-15 DIAGNOSIS — E1165 Type 2 diabetes mellitus with hyperglycemia: Secondary | ICD-10-CM | POA: Diagnosis not present

## 2021-02-15 DIAGNOSIS — D631 Anemia in chronic kidney disease: Secondary | ICD-10-CM | POA: Diagnosis not present

## 2021-02-15 DIAGNOSIS — N179 Acute kidney failure, unspecified: Secondary | ICD-10-CM | POA: Diagnosis not present

## 2021-02-15 DIAGNOSIS — E875 Hyperkalemia: Secondary | ICD-10-CM | POA: Diagnosis not present

## 2021-02-15 DIAGNOSIS — R112 Nausea with vomiting, unspecified: Secondary | ICD-10-CM | POA: Diagnosis not present

## 2021-02-15 DIAGNOSIS — R262 Difficulty in walking, not elsewhere classified: Secondary | ICD-10-CM | POA: Diagnosis not present

## 2021-02-18 ENCOUNTER — Ambulatory Visit: Payer: Medicare Other

## 2021-02-20 DIAGNOSIS — E860 Dehydration: Secondary | ICD-10-CM | POA: Diagnosis not present

## 2021-02-20 DIAGNOSIS — R5381 Other malaise: Secondary | ICD-10-CM | POA: Diagnosis not present

## 2021-02-20 DIAGNOSIS — N179 Acute kidney failure, unspecified: Secondary | ICD-10-CM | POA: Diagnosis not present

## 2021-02-20 DIAGNOSIS — N170 Acute kidney failure with tubular necrosis: Secondary | ICD-10-CM | POA: Diagnosis not present

## 2021-02-21 DIAGNOSIS — R7989 Other specified abnormal findings of blood chemistry: Secondary | ICD-10-CM | POA: Diagnosis not present

## 2021-02-26 DIAGNOSIS — E1165 Type 2 diabetes mellitus with hyperglycemia: Secondary | ICD-10-CM | POA: Diagnosis not present

## 2021-03-01 DIAGNOSIS — N390 Urinary tract infection, site not specified: Secondary | ICD-10-CM | POA: Diagnosis not present

## 2021-03-01 DIAGNOSIS — E1165 Type 2 diabetes mellitus with hyperglycemia: Secondary | ICD-10-CM | POA: Diagnosis not present

## 2021-03-07 ENCOUNTER — Other Ambulatory Visit: Payer: Self-pay | Admitting: *Deleted

## 2021-03-07 MED ORDER — APIXABAN 2.5 MG PO TABS: 2.5000 mg | ORAL_TABLET | Freq: Two times a day (BID) | ORAL | 1 refills | Status: DC

## 2021-03-07 NOTE — Telephone Encounter (Signed)
Eliquis 2.5mg  paper refill request received. Patient is 86 years old, weight-73kg, Crea-1.98 on 01/28/2021, Diagnosis-Afib, and last seen by Berniece Salines on 09/25/2020. Dose is appropriate based on dosing criteria. Will send in refill to requested pharmacy.

## 2021-03-11 ENCOUNTER — Other Ambulatory Visit: Payer: Self-pay | Admitting: Family Medicine

## 2021-03-12 DIAGNOSIS — E785 Hyperlipidemia, unspecified: Secondary | ICD-10-CM | POA: Diagnosis not present

## 2021-03-12 DIAGNOSIS — Z741 Need for assistance with personal care: Secondary | ICD-10-CM | POA: Diagnosis not present

## 2021-03-12 DIAGNOSIS — I10 Essential (primary) hypertension: Secondary | ICD-10-CM | POA: Diagnosis not present

## 2021-03-12 DIAGNOSIS — E669 Obesity, unspecified: Secondary | ICD-10-CM | POA: Diagnosis not present

## 2021-03-12 DIAGNOSIS — E1165 Type 2 diabetes mellitus with hyperglycemia: Secondary | ICD-10-CM | POA: Diagnosis not present

## 2021-03-12 DIAGNOSIS — I480 Paroxysmal atrial fibrillation: Secondary | ICD-10-CM | POA: Diagnosis not present

## 2021-03-12 DIAGNOSIS — N189 Chronic kidney disease, unspecified: Secondary | ICD-10-CM | POA: Diagnosis not present

## 2021-03-12 DIAGNOSIS — R262 Difficulty in walking, not elsewhere classified: Secondary | ICD-10-CM | POA: Diagnosis not present

## 2021-03-12 DIAGNOSIS — N390 Urinary tract infection, site not specified: Secondary | ICD-10-CM | POA: Diagnosis not present

## 2021-03-12 DIAGNOSIS — R5381 Other malaise: Secondary | ICD-10-CM | POA: Diagnosis not present

## 2021-03-12 DIAGNOSIS — R2689 Other abnormalities of gait and mobility: Secondary | ICD-10-CM | POA: Diagnosis not present

## 2021-03-12 DIAGNOSIS — R34 Anuria and oliguria: Secondary | ICD-10-CM | POA: Diagnosis not present

## 2021-03-12 DIAGNOSIS — N179 Acute kidney failure, unspecified: Secondary | ICD-10-CM | POA: Diagnosis not present

## 2021-03-12 DIAGNOSIS — N170 Acute kidney failure with tubular necrosis: Secondary | ICD-10-CM | POA: Diagnosis not present

## 2021-03-21 DIAGNOSIS — E785 Hyperlipidemia, unspecified: Secondary | ICD-10-CM | POA: Diagnosis not present

## 2021-03-21 DIAGNOSIS — E860 Dehydration: Secondary | ICD-10-CM | POA: Diagnosis not present

## 2021-03-21 DIAGNOSIS — E1122 Type 2 diabetes mellitus with diabetic chronic kidney disease: Secondary | ICD-10-CM | POA: Diagnosis not present

## 2021-03-21 DIAGNOSIS — N179 Acute kidney failure, unspecified: Secondary | ICD-10-CM | POA: Diagnosis not present

## 2021-03-21 DIAGNOSIS — R5381 Other malaise: Secondary | ICD-10-CM | POA: Diagnosis not present

## 2021-03-21 DIAGNOSIS — I129 Hypertensive chronic kidney disease with stage 1 through stage 4 chronic kidney disease, or unspecified chronic kidney disease: Secondary | ICD-10-CM | POA: Diagnosis not present

## 2021-03-21 DIAGNOSIS — N184 Chronic kidney disease, stage 4 (severe): Secondary | ICD-10-CM | POA: Diagnosis not present

## 2021-03-21 DIAGNOSIS — E1165 Type 2 diabetes mellitus with hyperglycemia: Secondary | ICD-10-CM | POA: Diagnosis not present

## 2021-03-21 DIAGNOSIS — D631 Anemia in chronic kidney disease: Secondary | ICD-10-CM | POA: Diagnosis not present

## 2021-03-24 ENCOUNTER — Other Ambulatory Visit: Payer: Self-pay | Admitting: Family Medicine

## 2021-03-24 DIAGNOSIS — N184 Chronic kidney disease, stage 4 (severe): Secondary | ICD-10-CM

## 2021-03-24 DIAGNOSIS — E782 Mixed hyperlipidemia: Secondary | ICD-10-CM

## 2021-03-24 DIAGNOSIS — E119 Type 2 diabetes mellitus without complications: Secondary | ICD-10-CM

## 2021-03-24 DIAGNOSIS — I131 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, with stage 1 through stage 4 chronic kidney disease, or unspecified chronic kidney disease: Secondary | ICD-10-CM

## 2021-03-25 ENCOUNTER — Other Ambulatory Visit: Payer: Medicare Other

## 2021-03-25 ENCOUNTER — Other Ambulatory Visit: Payer: Self-pay

## 2021-03-25 ENCOUNTER — Telehealth: Payer: Self-pay

## 2021-03-25 DIAGNOSIS — E119 Type 2 diabetes mellitus without complications: Secondary | ICD-10-CM | POA: Diagnosis not present

## 2021-03-25 DIAGNOSIS — E782 Mixed hyperlipidemia: Secondary | ICD-10-CM

## 2021-03-25 DIAGNOSIS — I131 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, with stage 1 through stage 4 chronic kidney disease, or unspecified chronic kidney disease: Secondary | ICD-10-CM

## 2021-03-25 NOTE — Telephone Encounter (Signed)
Pt calling to request her prolia shot while she is here Thursday for her appointment. She would like call back with what is needed.   [name], [place] 03/25/21 11:07 AM

## 2021-03-26 LAB — COMPREHENSIVE METABOLIC PANEL WITH GFR
ALT: 15 IU/L (ref 0–32)
AST: 21 IU/L (ref 0–40)
Albumin/Globulin Ratio: 1.1 — ABNORMAL LOW (ref 1.2–2.2)
Albumin: 3.5 g/dL — ABNORMAL LOW (ref 3.6–4.6)
Alkaline Phosphatase: 280 IU/L — ABNORMAL HIGH (ref 44–121)
BUN/Creatinine Ratio: 22 (ref 12–28)
BUN: 42 mg/dL — ABNORMAL HIGH (ref 8–27)
Bilirubin Total: 0.4 mg/dL (ref 0.0–1.2)
CO2: 24 mmol/L (ref 20–29)
Calcium: 9.4 mg/dL (ref 8.7–10.3)
Chloride: 102 mmol/L (ref 96–106)
Creatinine, Ser: 1.88 mg/dL — ABNORMAL HIGH (ref 0.57–1.00)
Globulin, Total: 3.2 g/dL (ref 1.5–4.5)
Glucose: 141 mg/dL — ABNORMAL HIGH (ref 70–99)
Potassium: 4.1 mmol/L (ref 3.5–5.2)
Sodium: 144 mmol/L (ref 134–144)
Total Protein: 6.7 g/dL (ref 6.0–8.5)
eGFR: 26 mL/min/1.73 — ABNORMAL LOW

## 2021-03-26 LAB — HEMOGLOBIN A1C
Est. average glucose Bld gHb Est-mCnc: 232 mg/dL
Hgb A1c MFr Bld: 9.7 % — ABNORMAL HIGH (ref 4.8–5.6)

## 2021-03-26 LAB — CBC WITH DIFFERENTIAL/PLATELET
Basophils Absolute: 0 x10E3/uL (ref 0.0–0.2)
Basos: 0 %
EOS (ABSOLUTE): 0 x10E3/uL (ref 0.0–0.4)
Eos: 1 %
Hematocrit: 28 % — ABNORMAL LOW (ref 34.0–46.6)
Hemoglobin: 9.4 g/dL — ABNORMAL LOW (ref 11.1–15.9)
Immature Grans (Abs): 0 x10E3/uL (ref 0.0–0.1)
Immature Granulocytes: 0 %
Lymphocytes Absolute: 0.9 x10E3/uL (ref 0.7–3.1)
Lymphs: 14 %
MCH: 31.8 pg (ref 26.6–33.0)
MCHC: 33.6 g/dL (ref 31.5–35.7)
MCV: 95 fL (ref 79–97)
Monocytes Absolute: 0.6 x10E3/uL (ref 0.1–0.9)
Monocytes: 9 %
Neutrophils Absolute: 5.1 x10E3/uL (ref 1.4–7.0)
Neutrophils: 76 %
Platelets: 281 x10E3/uL (ref 150–450)
RBC: 2.96 x10E6/uL — ABNORMAL LOW (ref 3.77–5.28)
RDW: 13.3 % (ref 11.7–15.4)
WBC: 6.6 x10E3/uL (ref 3.4–10.8)

## 2021-03-26 LAB — LIPID PANEL
Chol/HDL Ratio: 2.6 ratio (ref 0.0–4.4)
Cholesterol, Total: 123 mg/dL (ref 100–199)
HDL: 47 mg/dL
LDL Chol Calc (NIH): 57 mg/dL (ref 0–99)
Triglycerides: 100 mg/dL (ref 0–149)
VLDL Cholesterol Cal: 19 mg/dL (ref 5–40)

## 2021-03-26 LAB — CARDIOVASCULAR RISK ASSESSMENT

## 2021-03-27 ENCOUNTER — Encounter: Payer: Self-pay | Admitting: Family Medicine

## 2021-03-27 ENCOUNTER — Telehealth: Payer: Self-pay

## 2021-03-27 DIAGNOSIS — Z9181 History of falling: Secondary | ICD-10-CM | POA: Diagnosis not present

## 2021-03-27 DIAGNOSIS — Z87440 Personal history of urinary (tract) infections: Secondary | ICD-10-CM | POA: Diagnosis not present

## 2021-03-27 DIAGNOSIS — E860 Dehydration: Secondary | ICD-10-CM | POA: Diagnosis not present

## 2021-03-27 DIAGNOSIS — I129 Hypertensive chronic kidney disease with stage 1 through stage 4 chronic kidney disease, or unspecified chronic kidney disease: Secondary | ICD-10-CM | POA: Diagnosis not present

## 2021-03-27 DIAGNOSIS — E1122 Type 2 diabetes mellitus with diabetic chronic kidney disease: Secondary | ICD-10-CM | POA: Diagnosis not present

## 2021-03-27 DIAGNOSIS — N189 Chronic kidney disease, unspecified: Secondary | ICD-10-CM | POA: Diagnosis not present

## 2021-03-27 DIAGNOSIS — E1165 Type 2 diabetes mellitus with hyperglycemia: Secondary | ICD-10-CM | POA: Diagnosis not present

## 2021-03-27 DIAGNOSIS — N170 Acute kidney failure with tubular necrosis: Secondary | ICD-10-CM | POA: Diagnosis not present

## 2021-03-27 DIAGNOSIS — Z7901 Long term (current) use of anticoagulants: Secondary | ICD-10-CM | POA: Diagnosis not present

## 2021-03-27 DIAGNOSIS — Z794 Long term (current) use of insulin: Secondary | ICD-10-CM | POA: Diagnosis not present

## 2021-03-27 DIAGNOSIS — E785 Hyperlipidemia, unspecified: Secondary | ICD-10-CM | POA: Diagnosis not present

## 2021-03-27 DIAGNOSIS — Z7984 Long term (current) use of oral hypoglycemic drugs: Secondary | ICD-10-CM | POA: Diagnosis not present

## 2021-03-27 DIAGNOSIS — I4891 Unspecified atrial fibrillation: Secondary | ICD-10-CM | POA: Diagnosis not present

## 2021-03-27 NOTE — Telephone Encounter (Signed)
[facility] w/ [facility] calling for verbal orders. Pt was d/c from [facility] after hospitalization. Home last week. Nursing evaluated today. Nursing requests visits once a week for four weeks for disease education and medication management. Requests one visit from PT and one visit from OT both for evaluations. Orders for nursing, PT, and OT given.   [name], [place] 03/27/21 2:46 PM

## 2021-03-28 ENCOUNTER — Ambulatory Visit (INDEPENDENT_AMBULATORY_CARE_PROVIDER_SITE_OTHER): Payer: Medicare Other | Admitting: Family Medicine

## 2021-03-28 ENCOUNTER — Telehealth: Payer: Self-pay

## 2021-03-28 ENCOUNTER — Other Ambulatory Visit: Payer: Self-pay

## 2021-03-28 VITALS — BP 116/80 | HR 84 | Temp 97.3°F | Resp 18 | Ht 61.0 in | Wt 158.2 lb

## 2021-03-28 DIAGNOSIS — E1165 Type 2 diabetes mellitus with hyperglycemia: Secondary | ICD-10-CM | POA: Diagnosis not present

## 2021-03-28 DIAGNOSIS — I131 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, with stage 1 through stage 4 chronic kidney disease, or unspecified chronic kidney disease: Secondary | ICD-10-CM | POA: Diagnosis not present

## 2021-03-28 DIAGNOSIS — I480 Paroxysmal atrial fibrillation: Secondary | ICD-10-CM

## 2021-03-28 DIAGNOSIS — E1122 Type 2 diabetes mellitus with diabetic chronic kidney disease: Secondary | ICD-10-CM

## 2021-03-28 DIAGNOSIS — E860 Dehydration: Secondary | ICD-10-CM | POA: Diagnosis not present

## 2021-03-28 DIAGNOSIS — N184 Chronic kidney disease, stage 4 (severe): Secondary | ICD-10-CM

## 2021-03-28 DIAGNOSIS — R29898 Other symptoms and signs involving the musculoskeletal system: Secondary | ICD-10-CM

## 2021-03-28 DIAGNOSIS — I129 Hypertensive chronic kidney disease with stage 1 through stage 4 chronic kidney disease, or unspecified chronic kidney disease: Secondary | ICD-10-CM | POA: Diagnosis not present

## 2021-03-28 DIAGNOSIS — K743 Primary biliary cirrhosis: Secondary | ICD-10-CM

## 2021-03-28 DIAGNOSIS — K2100 Gastro-esophageal reflux disease with esophagitis, without bleeding: Secondary | ICD-10-CM | POA: Diagnosis not present

## 2021-03-28 DIAGNOSIS — N170 Acute kidney failure with tubular necrosis: Secondary | ICD-10-CM | POA: Diagnosis not present

## 2021-03-28 DIAGNOSIS — N189 Chronic kidney disease, unspecified: Secondary | ICD-10-CM | POA: Diagnosis not present

## 2021-03-28 NOTE — Progress Notes (Signed)
Subjective:  Patient ID: Sara Hancock, female    DOB: [birth year]  Age: 86 y.o. MRN: [id number]  Chief Complaint  Patient presents with   Diabetes    HPI Patient is an 86 year old white female with past medical history significant for diabetes, atrial fibrillation, hypertensive heart and chronic kidney disease stage IV without heart failure, primary biliary cholangitis, GERD, anemia secondary to chronic kidney disease, and hyperlipidemia. Most recently the patient was admitted for acute renal failure on chronic kidney disease.  This was October 3 through February 15, 2021 patient's creatinine upon admission was 6.5.  With hydration the patient's creatinine came down to 2.8.  The patient was discharged to skilled nursing facility.  She was there for approximately 1 month and then was discharged home on November 10..  She is doing very well.  The only issue she had while she was admitted at the nursing home was her sugars are very high and were not treated as aggressively as she prefers.  Upon discharge from the hospital she was sent home on low-dose Lasix and started torsemide.  Since that time she is seen her nephrologist to stop the Lasix and put her back on torsemide.   Hyperlipidemia: Currently on simvastatin 40 mg once daily. Chronic kidney disease: Stable.  Sees nephrology. Diabetes: Current medications include Levemir 32 units in the morning and 18 units before supper and farxiga 10 mg once daily.  Patient no longer is on Janumet.  Patient is checking her sugars numerous times per day.  Patient kept a sugar log while she was at [facility].  Her sugars were in the 500s when she was admitted there and then gradually decreased to the 400s 300s 200s until she was discharged.  Since being home her sugars have improved in the last 4 days they have been anywhere from 140-170.   Hypertension: Her blood pressures have been in the 130s to 151 over 80s to low 90s.  Patient is currently on  irbesartan 150 mg once daily, spironolactone 25 mg once daily torsemide 20 mg once daily, metoprolol 100 mg once daily, hydralazine 100 mg twice daily.  Primary biliary cholangitis is being treated with ursodiol 300 mg twice daily. Atrial fibrillation: Currently on Eliquis 2-1/2 mg 1 twice daily and metoprolol 100 mg twice daily.  Current Outpatient Medications on File Prior to Visit  Medication Sig Dispense Refill   irbesartan (AVAPRO) 150 MG tablet Take 150 mg by mouth daily.     Accu-Chek FastClix Lancets MISC USE TO CHECK BLOOD SUGAR TWICE DAILY 204 each 3   apixaban (ELIQUIS) 2.5 MG TABS tablet Take 1 tablet (2.5 mg total) by mouth 2 (two) times daily. 180 tablet 1   calcium-vitamin D (OSCAL WITH D) 250-125 MG-UNIT per tablet Take 1 tablet by mouth daily.     Cholecalciferol (VITAMIN D3) 1000 units CAPS Take 1,000 Units by mouth daily.     fexofenadine (ALLEGRA ALLERGY) 60 MG tablet Take 1 tablet (60 mg total) by mouth 2 (two) times daily. 180 tablet 1   fluticasone (FLONASE) 50 MCG/ACT nasal spray Place 1 spray into both nostrils as needed.      folic acid (FOLVITE) 476 MCG tablet Take 400 mcg by mouth daily.     Insulin Pen Needle (BD PEN NEEDLE NANO 2ND GEN) 32G X 4 MM MISC Use a new needle each time when checking FBS 200 each 3   metoprolol tartrate (LOPRESSOR) 100 MG tablet TAKE 1 TABLET(100 MG) BY MOUTH TWICE DAILY  180 tablet 3   Multiple Vitamin (MULTIVITAMIN WITH MINERALS) TABS Take 1 tablet by mouth daily.     nystatin cream (MYCOSTATIN) APPLY SUFFICIENT AMOUNT EXTERNALLY TO THE AFFECTED AREA TWICE DAILY 30 g 1   pantoprazole (PROTONIX) 40 MG tablet TAKE 1 TABLET BY MOUTH EVERY DAY 90 tablet 3   simvastatin (ZOCOR) 40 MG tablet TAKE 1 TABLET BY MOUTH AT BEDTIME 90 tablet 1   spironolactone (ALDACTONE) 25 MG tablet Take 25 mg by mouth daily.     torsemide (DEMADEX) 20 MG tablet TAKE 2 TABLETS BY MOUTH TWICE DAILY (Patient taking differently: 20 mg daily.) 360 tablet 0   vitamin C  (ASCORBIC ACID) 500 MG tablet Take 500 mg by mouth daily.     vitamin E 400 UNIT capsule Take 400 Units by mouth daily.     No current facility-administered medications on file prior to visit.   Past Medical History:  Diagnosis Date   Acquired thrombophilia ([place]) 09/11/2019   Allergic rhinitis 09/17/2015   Anemia    ANEMIA, NORMOCYTIC 10/27/2007   Qualifier: Diagnosis of  By: Nils Pyle CMA (AAMA), Leisha     Anticoagulant long-term use 01/08/2011   Arthritis    Benign neoplasm of colon 01/08/2011   Biliary cirrhosis ([place])    BRADYCARDIA 11/15/2008   Qualifier: Diagnosis of  By: Hollie Salk CMA, Amanda     Cellulitis of left foot 09/25/2016   Cholelithiasis 01/09/12   Chronic kidney disease, stage 3 (HCC)    Combined form of age-related cataract, both eyes 02/26/2017   Diabetes mellitus type 2 without retinopathy ([place]) 10/27/2013   Diverticulosis of colon with hemorrhage 06/08/2000   Dyslipidemia 09/17/2015   Encounter for therapeutic drug monitoring 06/09/2013   Esophageal reflux 01/08/2011   Esophageal stenosis 01/08/11   Esophagitis 01/08/11   GALLSTONES 10/26/2008   Qualifier: Diagnosis of  By: Sharlett Iles MD Byrd Hesselbach    Gastritis, erosive chronic 01/08/2011   GERD (gastroesophageal reflux disease)    Glaucoma suspect of both eyes 02/26/2017   Glaucoma suspect of both eyes 02/26/2017   Hiatal hernia 01/08/11   History of IBS 10/27/2007   Qualifier: Diagnosis of  By: Nils Pyle CMA (AAMA), Mearl Latin     HTN (hypertension)    Hx of adenomatous colonic polyps 01/08/11   Hyperplastic colon polyp 06/08/2000, 07/31/2003, 01/08/11   Hypertension, essential 10/27/2007   Qualifier: Diagnosis of  By: Nils Pyle CMA (AAMA), Mearl Latin     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, with stage 1 through stage 4 chronic kidney disease, or unspecified chronic kidney disease    Iron deficiency anemia 01/08/2011   Left foot pain 09/25/2016   Localized edema    Long term (current) use of insulin ([place])     Mixed hyperlipidemia    Nuclear cataract 07/03/2011   Obesity    Obesity (BMI 30-39.9) 03/22/2012   Osteoarthritis of right thumb 09/17/2015   Osteopenia    Other iron deficiency anemias    Other specified menopausal and perimenopausal disorders    PAF (paroxysmal atrial fibrillation) (HCC)    occurring in the setting of bradycardia and mild first degree AV block followed by Dr. Jens Som   Pain in thoracic spine    Paraesophageal hiatal hernia, 3cm by EGD 03/22/2012   Pedal edema 12/13/2019   Persistent atrial fibrillation ([place]) 08/28/2019   Posterior vitreous detachment, bilateral 01/10/2016   Primary biliary cholangitis ([place]) 10/27/2007   Qualifier: Diagnosis of  By: Nils Pyle CMA (AAMA), Mearl Latin  Pure hypercholesterolemia    Retinal tear, right 07/03/2011   Shortness of breath 12/13/2019   Stage 3b chronic kidney disease ([place]) 09/11/2019   Type II or unspecified type diabetes mellitus without mention of complication, not stated as uncontrolled    Uterine fibroid 01/09/12   Past Surgical History:  Procedure Laterality Date   BREAST CYST EXCISION     left   CARDIOVERSION N/A 11/27/2017   Procedure: CARDIOVERSION;  Surgeon: Sanda Klein, MD;  Location: [facility];  Service: Cardiovascular;  Laterality: N/A;   COLONOSCOPY     INSERTION OF MESH  05/18/2012   Procedure: INSERTION OF MESH;  Surgeon: Adin Hector, MD;  Location: [facility];  Service: General;  Laterality: N/A;   LIVER BIOPSY  05/18/2012   Procedure: LIVER BIOPSY;  Surgeon: Adin Hector, MD;  Location: [facility];  Service: General;;   POLYPECTOMY     THYROIDECTOMY, PARTIAL  1980   [place]  05/18/2012   Procedure: LAPAROSCOPIC VENTRAL HERNIA;  Surgeon: Adin Hector, MD;  Location: [facility];  Service: General;  Laterality: N/A;  Laparoscopic Ventral Wall Hernia with Mesh    Family History  Problem Relation Age of Onset   Heart attack Father    Stroke Mother    Diabetes Sister        x 2    Aneurysm Sister    Stroke Sister    Congestive Heart Failure Sister    Diabetes Brother    Kidney failure Other    Hyperlipidemia Other    Arrhythmia Other    Congestive Heart Failure Other    Hypertension Other    Arthritis Other    Colon cancer Neg Hx    Esophageal cancer Neg Hx    Stomach cancer Neg Hx    Rectal cancer Neg Hx    Social History   Socioeconomic History   Marital status: Married    Spouse name: Not on file   Number of children: 2   Years of education: Not on file   Highest education level: Not on file  Occupational History   Occupation: retired    Fish farm manager: RETIRED  Tobacco Use   Smoking status: Never   Smokeless tobacco: Never  Vaping Use   Vaping Use: Never used  Substance and Sexual Activity   Alcohol use: No   Drug use: No   Sexual activity: Not Currently  Other Topics Concern   Not on file  Social History Narrative   Not on file   Social Determinants of Health   Financial Resource Strain: Not on file  Food Insecurity: Not on file  Transportation Needs: Not on file  Physical Activity: Not on file  Stress: Not on file  Social Connections: Not on file    Review of Systems  Constitutional:  Negative for chills, fatigue and fever.  HENT:  Negative for congestion, rhinorrhea and sore throat.   Respiratory:  Negative for cough and shortness of breath.   Cardiovascular:  Negative for chest pain.  Gastrointestinal:  Negative for abdominal pain, constipation, diarrhea, nausea and vomiting.  Genitourinary:  Negative for dysuria and urgency.  Musculoskeletal:  Negative for back pain and myalgias.  Neurological:  Negative for dizziness, weakness, light-headedness and headaches.  Psychiatric/Behavioral:  Negative for dysphoric mood. The patient is not nervous/anxious.     Objective:  BP 116/80   Pulse 84   Temp (!) 97.3 F (36.3 C)   Resp 18  Ht 5\' 1"  (1.549 m)   Wt 158 lb 3.2 oz (71.8 kg)   BMI 29.89 kg/m   BP/Weight 04/24/2021  04/10/2021 71/24/5809  Systolic BP 983 382 505  Diastolic BP 72 87 80  Wt. (Lbs) 168 165.8 158.2  BMI 30.73 31.33 29.89    Physical Exam Vitals reviewed.  Constitutional:      Appearance: Normal appearance.  Neck:     Vascular: No carotid bruit.  Cardiovascular:     Rate and Rhythm: Normal rate and regular rhythm.     Pulses: Normal pulses.     Heart sounds: Normal heart sounds.  Pulmonary:     Effort: Pulmonary effort is normal.     Breath sounds: Normal breath sounds.  Abdominal:     General: Bowel sounds are normal.     Palpations: Abdomen is soft.     Tenderness: There is no abdominal tenderness.  Neurological:     Mental Status: She is alert and oriented to person, place, and time.  Psychiatric:        Mood and Affect: Mood normal.        Behavior: Behavior normal.    Diabetic Foot Exam - Simple   No data filed      Lab Results  Component Value Date   WBC 6.6 03/25/2021   HGB 9.4 (L) 03/25/2021   HCT 28.0 (L) 03/25/2021   PLT 281 03/25/2021   GLUCOSE 86 04/18/2021   CHOL 123 03/25/2021   TRIG 100 03/25/2021   HDL 47 03/25/2021   LDLCALC 57 03/25/2021   ALT 15 03/25/2021   AST 21 03/25/2021   NA 137 04/18/2021   K 4.9 04/18/2021   CL 101 04/18/2021   CREATININE 3.08 (H) 04/18/2021   BUN 62 (H) 04/18/2021   CO2 20 04/18/2021   TSH 2.020 09/10/2020   INR 2.7 08/14/2020   HGBA1C 9.7 (H) 03/25/2021      Assessment & Plan:   Problem List Items Addressed This Visit       Cardiovascular and Mediastinum   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, with stage 1 through stage 4 chronic kidney disease, or unspecified chronic kidney disease    Well controlled.  No changes to medicines.  Continue to work on eating a healthy diet and exercise.  Labs drawn today.        Relevant Medications   irbesartan (AVAPRO) 150 MG tablet   PAF (paroxysmal atrial fibrillation) (HCC)    The current medical regimen is effective;  continue  present plan and medications. Continue eliquis and metoprolol      Relevant Medications   irbesartan (AVAPRO) 150 MG tablet     Digestive   Primary biliary cholangitis (HCC)    Continue ursodiol.  Management per specialist.        GERD (gastroesophageal reflux disease)    Continue pantoprazole.        Endocrine   CKD stage 4 due to type 2 diabetes mellitus ([place])    Control: poor Recommend check sugars qac and qhs Recommend check feet daily. Recommend annual eye exams. Medicines: cocntinue farxiga 10 mg daily, levemir 32 U in am and 18 U at supper.  Continue to work on eating a healthy diet and exercise.  Labs drawn today.         Relevant Medications   irbesartan (AVAPRO) 150 MG tablet     Other   Weakness of both hips - Primary  . Follow-up: Return in about  3 months (around 06/28/2021) for chronic follow up.  An After Visit Summary was printed and given to the patient.  Rochel Brome, MD [facility] [phone]

## 2021-03-28 NOTE — Telephone Encounter (Signed)
Ray, OT w/ Sara Hancock calling for verbal orders. Saw pt today and requests visits for continuation of care. Requests once a week fro four weeks. Gave verbal orders for this schedule.   [name] 03/28/21 3:46 PM

## 2021-03-29 DIAGNOSIS — S61412A Laceration without foreign body of left hand, initial encounter: Secondary | ICD-10-CM | POA: Diagnosis not present

## 2021-03-29 DIAGNOSIS — S20211A Contusion of right front wall of thorax, initial encounter: Secondary | ICD-10-CM | POA: Diagnosis not present

## 2021-03-31 DIAGNOSIS — R29898 Other symptoms and signs involving the musculoskeletal system: Secondary | ICD-10-CM | POA: Insufficient documentation

## 2021-04-01 ENCOUNTER — Other Ambulatory Visit: Payer: Self-pay | Admitting: Family Medicine

## 2021-04-01 DIAGNOSIS — I129 Hypertensive chronic kidney disease with stage 1 through stage 4 chronic kidney disease, or unspecified chronic kidney disease: Secondary | ICD-10-CM | POA: Diagnosis not present

## 2021-04-01 DIAGNOSIS — E1122 Type 2 diabetes mellitus with diabetic chronic kidney disease: Secondary | ICD-10-CM | POA: Diagnosis not present

## 2021-04-01 DIAGNOSIS — E860 Dehydration: Secondary | ICD-10-CM | POA: Diagnosis not present

## 2021-04-01 DIAGNOSIS — N170 Acute kidney failure with tubular necrosis: Secondary | ICD-10-CM | POA: Diagnosis not present

## 2021-04-01 DIAGNOSIS — E1165 Type 2 diabetes mellitus with hyperglycemia: Secondary | ICD-10-CM | POA: Diagnosis not present

## 2021-04-01 DIAGNOSIS — N189 Chronic kidney disease, unspecified: Secondary | ICD-10-CM | POA: Diagnosis not present

## 2021-04-01 NOTE — Progress Notes (Signed)
Levemir. Increase to 35 U in am and 20 U in pm.  Discontinue farxiga and januvia due to Stage 4 CKD. kc

## 2021-04-02 DIAGNOSIS — I129 Hypertensive chronic kidney disease with stage 1 through stage 4 chronic kidney disease, or unspecified chronic kidney disease: Secondary | ICD-10-CM | POA: Diagnosis not present

## 2021-04-02 DIAGNOSIS — E1122 Type 2 diabetes mellitus with diabetic chronic kidney disease: Secondary | ICD-10-CM | POA: Diagnosis not present

## 2021-04-02 DIAGNOSIS — N189 Chronic kidney disease, unspecified: Secondary | ICD-10-CM | POA: Diagnosis not present

## 2021-04-02 DIAGNOSIS — E1165 Type 2 diabetes mellitus with hyperglycemia: Secondary | ICD-10-CM | POA: Diagnosis not present

## 2021-04-02 DIAGNOSIS — E860 Dehydration: Secondary | ICD-10-CM | POA: Diagnosis not present

## 2021-04-02 DIAGNOSIS — N170 Acute kidney failure with tubular necrosis: Secondary | ICD-10-CM | POA: Diagnosis not present

## 2021-04-08 ENCOUNTER — Other Ambulatory Visit: Payer: Medicare Other

## 2021-04-08 DIAGNOSIS — N184 Chronic kidney disease, stage 4 (severe): Secondary | ICD-10-CM

## 2021-04-09 DIAGNOSIS — E860 Dehydration: Secondary | ICD-10-CM | POA: Diagnosis not present

## 2021-04-09 DIAGNOSIS — S20211D Contusion of right front wall of thorax, subsequent encounter: Secondary | ICD-10-CM | POA: Diagnosis not present

## 2021-04-09 DIAGNOSIS — N170 Acute kidney failure with tubular necrosis: Secondary | ICD-10-CM | POA: Diagnosis not present

## 2021-04-09 DIAGNOSIS — E1165 Type 2 diabetes mellitus with hyperglycemia: Secondary | ICD-10-CM | POA: Diagnosis not present

## 2021-04-09 DIAGNOSIS — S61412D Laceration without foreign body of left hand, subsequent encounter: Secondary | ICD-10-CM | POA: Diagnosis not present

## 2021-04-09 DIAGNOSIS — N189 Chronic kidney disease, unspecified: Secondary | ICD-10-CM | POA: Diagnosis not present

## 2021-04-09 DIAGNOSIS — I129 Hypertensive chronic kidney disease with stage 1 through stage 4 chronic kidney disease, or unspecified chronic kidney disease: Secondary | ICD-10-CM | POA: Diagnosis not present

## 2021-04-09 DIAGNOSIS — E1122 Type 2 diabetes mellitus with diabetic chronic kidney disease: Secondary | ICD-10-CM | POA: Diagnosis not present

## 2021-04-09 DIAGNOSIS — S81809A Unspecified open wound, unspecified lower leg, initial encounter: Secondary | ICD-10-CM | POA: Diagnosis not present

## 2021-04-09 LAB — IRON AND TIBC
Iron Saturation: 22 % (ref 15–55)
Iron: 60 ug/dL (ref 27–139)
Total Iron Binding Capacity: 267 ug/dL (ref 250–450)
UIBC: 207 ug/dL (ref 118–369)

## 2021-04-09 LAB — RENAL FUNCTION PANEL
Albumin: 3.9 g/dL (ref 3.6–4.6)
BUN/Creatinine Ratio: 29 — ABNORMAL HIGH (ref 12–28)
BUN: 47 mg/dL — ABNORMAL HIGH (ref 8–27)
CO2: 23 mmol/L (ref 20–29)
Calcium: 9.7 mg/dL (ref 8.7–10.3)
Chloride: 100 mmol/L (ref 96–106)
Creatinine, Ser: 1.62 mg/dL — ABNORMAL HIGH (ref 0.57–1.00)
Glucose: 250 mg/dL — ABNORMAL HIGH (ref 70–99)
Phosphorus: 4.7 mg/dL — ABNORMAL HIGH (ref 3.0–4.3)
Potassium: 4.2 mmol/L (ref 3.5–5.2)
Sodium: 140 mmol/L (ref 134–144)
eGFR: 31 mL/min/{1.73_m2} — ABNORMAL LOW (ref >59)

## 2021-04-09 LAB — FERRITIN: Ferritin: 292 ng/mL — ABNORMAL HIGH (ref 15–150)

## 2021-04-10 ENCOUNTER — Encounter: Payer: Self-pay | Admitting: Cardiology

## 2021-04-10 ENCOUNTER — Other Ambulatory Visit: Payer: Self-pay

## 2021-04-10 ENCOUNTER — Ambulatory Visit (INDEPENDENT_AMBULATORY_CARE_PROVIDER_SITE_OTHER): Payer: Medicare Other | Admitting: Cardiology

## 2021-04-10 VITALS — BP 134/87 | HR 93 | Ht 61.0 in | Wt 165.8 lb

## 2021-04-10 DIAGNOSIS — I38 Endocarditis, valve unspecified: Secondary | ICD-10-CM

## 2021-04-10 DIAGNOSIS — E1122 Type 2 diabetes mellitus with diabetic chronic kidney disease: Secondary | ICD-10-CM

## 2021-04-10 DIAGNOSIS — I131 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, with stage 1 through stage 4 chronic kidney disease, or unspecified chronic kidney disease: Secondary | ICD-10-CM | POA: Diagnosis not present

## 2021-04-10 DIAGNOSIS — I5030 Unspecified diastolic (congestive) heart failure: Secondary | ICD-10-CM

## 2021-04-10 DIAGNOSIS — E782 Mixed hyperlipidemia: Secondary | ICD-10-CM | POA: Diagnosis not present

## 2021-04-10 DIAGNOSIS — I480 Paroxysmal atrial fibrillation: Secondary | ICD-10-CM

## 2021-04-10 DIAGNOSIS — N184 Chronic kidney disease, stage 4 (severe): Secondary | ICD-10-CM

## 2021-04-10 DIAGNOSIS — Z7901 Long term (current) use of anticoagulants: Secondary | ICD-10-CM

## 2021-04-10 DIAGNOSIS — E119 Type 2 diabetes mellitus without complications: Secondary | ICD-10-CM | POA: Diagnosis not present

## 2021-04-10 MED ORDER — METOLAZONE 5 MG PO TABS: ORAL_TABLET | ORAL | 0 refills | Status: DC

## 2021-04-10 NOTE — Progress Notes (Signed)
Cardiology Office Note:    Date:  04/11/2021   ID:  Sara Hancock, DOB Oct 28, 1934, MRN [id number]  PCP:  Rochel Brome, MD  Cardiologist:  Berniece Salines, DO  Electrophysiologist:  None   Referring MD: Rochel Brome, MD   No chief complaint on file.   History of Present Illness:    Sara Hancock is a 86 y.o. female with a hx of atrial fibrillation on Coumadin and metoprolol, diastolic heart failure, hypertension presents today for follow-up visit.     I saw the patient on March 26, 2020 at that time we kept her on her for some mild twice daily.  But in the interim I have given the patient some metolazone due to bilateral leg edema.  She is here today for follow-up visit.   I saw the patient on June 26, 2020 at that time she appeared to be doing well from a cardiovascular standpoint.  She was at her baseline.  We did blood work with electrolytes.  And she continue to follow with our Coumadin clinic.  At her visit on Sep 25, 2020 the patient had transitioned to Eliquis off Coumadin.  She seems to be happy with that decision.  At that visit she was doing well from a cardiovascular standpoint.  No medication changes were made.  Since I saw the patient she has been hospitalized and was also sent to [place] as she did have renal failure and I was concerned that she was going to be placed on dialysis.  She also was diuresed while she was there.  She has been following with her nephrologist since hospitalization.  Her diuretics has been decreased due to her kidney function.  She is following with the nephrologist closely.  Today she is concerned because she has had more weight gain and she is worried about that.  Her leg edema has been creased a little bit as well.  Past Medical History:  Diagnosis Date   Acquired thrombophilia ([place]) 09/11/2019   Allergic rhinitis 09/17/2015   Anemia    ANEMIA, NORMOCYTIC 10/27/2007   Qualifier: Diagnosis of  By: Nils Pyle CMA (AAMA), Leisha      Anticoagulant long-term use 01/08/2011   Arthritis    Benign neoplasm of colon 01/08/2011   Biliary cirrhosis ([place])    BRADYCARDIA 11/15/2008   Qualifier: Diagnosis of  By: Hollie Salk CMA, Amanda     Cellulitis of left foot 09/25/2016   Cholelithiasis 01/09/12   Chronic kidney disease, stage 3 (HCC)    Combined form of age-related cataract, both eyes 02/26/2017   Diabetes mellitus type 2 without retinopathy ([place]) 10/27/2013   Diverticulosis of colon with hemorrhage 06/08/2000   Dyslipidemia 09/17/2015   Encounter for therapeutic drug monitoring 06/09/2013   Esophageal reflux 01/08/2011   Esophageal stenosis 01/08/11   Esophagitis 01/08/11   GALLSTONES 10/26/2008   Qualifier: Diagnosis of  By: Sharlett Iles MD Byrd Hesselbach    Gastritis, erosive chronic 01/08/2011   GERD (gastroesophageal reflux disease)    Glaucoma suspect of both eyes 02/26/2017   Glaucoma suspect of both eyes 02/26/2017   Hiatal hernia 01/08/11   History of IBS 10/27/2007   Qualifier: Diagnosis of  By: Nils Pyle CMA (AAMA), Leisha     HTN (hypertension)    Hx of adenomatous colonic polyps 01/08/11   Hyperplastic colon polyp 06/08/2000, 07/31/2003, 01/08/11   Hypertension, essential 10/27/2007   Qualifier: Diagnosis of  By: Nils Pyle CMA (AAMA), Mearl Latin     Hypertensive heart and chronic kidney disease  without heart failure, with stage 1 through stage 4 chronic kidney disease, or unspecified chronic kidney disease    Iron deficiency anemia 01/08/2011   Left foot pain 09/25/2016   Localized edema    Long term (current) use of insulin (HCC)    Mixed hyperlipidemia    Nuclear cataract 07/03/2011   Obesity    Obesity (BMI 30-39.9) 03/22/2012   Osteoarthritis of right thumb 09/17/2015   Osteopenia    Other iron deficiency anemias    Other specified menopausal and perimenopausal disorders    PAF (paroxysmal atrial fibrillation) (HCC)    occurring in the setting of bradycardia and mild first degree AV block followed by Dr. 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   Qualifier: Diagnosis of  By: Nils Pyle CMA (AAMA), Mearl Latin     Pure hypercholesterolemia    Retinal tear, right 07/03/2011   Shortness of breath 12/13/2019   Stage 3b chronic kidney disease ([place]) 09/11/2019   Type II or unspecified type diabetes mellitus without mention of complication, not stated as uncontrolled    Uterine fibroid 01/09/12    Past Surgical History:  Procedure Laterality Date   BREAST CYST EXCISION     left   CARDIOVERSION N/A 11/27/2017   Procedure: CARDIOVERSION;  Surgeon: Sanda Klein, MD;  Location: [facility];  Service: Cardiovascular;  Laterality: N/A;   COLONOSCOPY     INSERTION OF MESH  05/18/2012   Procedure: INSERTION OF MESH;  Surgeon: Adin Hector, MD;  Location: [facility];  Service: General;  Laterality: N/A;   LIVER BIOPSY  05/18/2012   Procedure: LIVER BIOPSY;  Surgeon: Adin Hector, MD;  Location: [facility];  Service: General;;   POLYPECTOMY     THYROIDECTOMY, [place]  05/18/2012   Procedure: LAPAROSCOPIC VENTRAL HERNIA;  Surgeon: Adin Hector, MD;  Location: [facility];  Service: General;  Laterality: N/A;  Laparoscopic Ventral Wall Hernia with Mesh    Current Medications: Current Meds  Medication Sig   ACCU-CHEK AVIVA PLUS test strip USE TO TEST TWICE DAILY   Accu-Chek FastClix Lancets MISC USE TO CHECK BLOOD SUGAR TWICE DAILY   apixaban (ELIQUIS) 2.5 MG TABS tablet Take 1 tablet (2.5 mg total) by mouth 2 (two) times daily.   calcium-vitamin D (OSCAL WITH D) 250-125 MG-UNIT per tablet Take 1 tablet by mouth daily.   Cholecalciferol (VITAMIN D3) 1000 units CAPS Take 1,000 Units by mouth daily.   fexofenadine (ALLEGRA ALLERGY) 60 MG tablet Take 1 tablet (60 mg total) by  mouth 2 (two) times daily.   fluticasone (FLONASE) 50 MCG/ACT nasal spray Place 1 spray into both nostrils as needed.    folic acid (FOLVITE) 413 MCG tablet Take 400 mcg by mouth daily.   hydrALAZINE (APRESOLINE) 100 MG tablet TAKE 1 TABLET(100 MG) BY MOUTH FOUR TIMES DAILY (Patient taking differently: 2 (two) times daily.)   Insulin Pen Needle (BD PEN NEEDLE NANO 2ND GEN) 32G X 4 MM MISC Use a new needle each time when checking FBS   irbesartan (AVAPRO) 150 MG tablet Take 150 mg by mouth daily.   LEVEMIR FLEXTOUCH 100 UNIT/ML FlexTouch Pen INJECT 32 UNITS UNDER THE SKIN EVERY MORNING AND 18 UNITS WITH SUPPER  metolazone (ZAROXOLYN) 5 MG tablet Take 30 minutes before first Torsemide dose   metoprolol tartrate (LOPRESSOR) 100 MG tablet TAKE 1 TABLET(100 MG) BY MOUTH TWICE DAILY   Multiple Vitamin (MULTIVITAMIN WITH MINERALS) TABS Take 1 tablet by mouth daily.   nystatin cream (MYCOSTATIN) APPLY SUFFICIENT AMOUNT EXTERNALLY TO THE AFFECTED AREA TWICE DAILY   pantoprazole (PROTONIX) 40 MG tablet TAKE 1 TABLET BY MOUTH EVERY DAY   simvastatin (ZOCOR) 40 MG tablet TAKE 1 TABLET BY MOUTH AT BEDTIME   spironolactone (ALDACTONE) 25 MG tablet Take 25 mg by mouth daily.   torsemide (DEMADEX) 20 MG tablet TAKE 2 TABLETS BY MOUTH TWICE DAILY (Patient taking differently: 20 mg daily.)   ursodiol (ACTIGALL) 300 MG capsule TAKE 1 CAPSULE(300 MG) BY MOUTH TWICE DAILY** CONTACT OFFICE TO SCHEDULE APPOINTMENTS**   vitamin C (ASCORBIC ACID) 500 MG tablet Take 500 mg by mouth daily.   vitamin E 400 UNIT capsule Take 400 Units by mouth daily.     Allergies:   Ace inhibitors, Capoten [captopril], and Neomycin-bacitracin zn-polymyx   Social History   Socioeconomic History   Marital status: Married    Spouse name: Not on file   Number of children: 2   Years of education: Not on file   Highest education level: Not on file  Occupational History   Occupation: retired    Fish farm manager: RETIRED  Tobacco Use    Smoking status: Never   Smokeless tobacco: Never  Vaping Use   Vaping Use: Never used  Substance and Sexual Activity   Alcohol use: No   Drug use: No   Sexual activity: Not Currently  Other Topics Concern   Not on file  Social History Narrative   Not on file   Social Determinants of Health   Financial Resource Strain: Not on file  Food Insecurity: Not on file  Transportation Needs: Not on file  Physical Activity: Not on file  Stress: Not on file  Social Connections: Not on file     Family History: The patient's family history includes Aneurysm in her sister; Arrhythmia in an other family member; Arthritis in an other family member; Congestive Heart Failure in her sister and another family member; Diabetes in her brother and sister; Heart attack in her father; Hyperlipidemia in an other family member; Hypertension in an other family member; Kidney failure in an other family member; Stroke in her mother and sister. There is no history of Colon cancer, Esophageal cancer, Stomach cancer, or Rectal cancer.  ROS:   Review of Systems  Constitution: Negative for decreased appetite, fever and weight gain.  HENT: Negative for congestion, ear discharge, hoarse voice and sore throat.   Eyes: Negative for discharge, redness, vision loss in right eye and visual halos.  Cardiovascular: Negative for chest pain, dyspnea on exertion, leg swelling, orthopnea and palpitations.  Respiratory: Negative for cough, hemoptysis, shortness of breath and snoring.   Endocrine: Negative for heat intolerance and polyphagia.  Hematologic/Lymphatic: Negative for bleeding problem. Does not bruise/bleed easily.  Skin: Negative for flushing, nail changes, rash and suspicious lesions.  Musculoskeletal: Negative for arthritis, joint pain, muscle cramps, myalgias, neck pain and stiffness.  Gastrointestinal: Negative for abdominal pain, bowel incontinence, diarrhea and excessive appetite.  Genitourinary: Negative for  decreased libido, genital sores and incomplete emptying.  Neurological: Negative for brief paralysis, focal weakness, headaches and loss of balance.  Psychiatric/Behavioral: Negative for altered mental status, depression and suicidal ideas.  Allergic/Immunologic: Negative for HIV exposure and persistent infections.  EKGs/Labs/Other Studies Reviewed:    The following studies were reviewed today:   EKG: Atrial fibrillation with controlled ventricular rate.  Recent Labs: 06/26/2020: Magnesium 1.7 09/10/2020: TSH 2.020 03/25/2021: ALT 15; Hemoglobin 9.4; Platelets 281 04/08/2021: BUN 47; Creatinine, Ser 1.62; Potassium 4.2; Sodium 140  Recent Lipid Panel    Component Value Date/Time   CHOL 123 03/25/2021 1409   TRIG 100 03/25/2021 1409   HDL 47 03/25/2021 1409   CHOLHDL 2.6 03/25/2021 1409   LDLCALC 57 03/25/2021 1409    Physical Exam:    VS:  BP 134/87   Pulse 93   Ht 5\' 1"  (1.549 m)   Wt 165 lb 12.8 oz (75.2 kg)   SpO2 99%   BMI 31.33 kg/m     Wt Readings from Last 3 Encounters:  04/10/21 165 lb 12.8 oz (75.2 kg)  03/28/21 158 lb 3.2 oz (71.8 kg)  12/20/20 161 lb (73 kg)     GEN: Well nourished, well developed in no acute distress HEENT: Normal NECK: No JVD; No carotid bruits LYMPHATICS: No lymphadenopathy CARDIAC: S1S2 noted,RRR, no murmurs, rubs, gallops RESPIRATORY:  Clear to auscultation without rales, wheezing or rhonchi  ABDOMEN: Soft, non-tender, non-distended, +bowel sounds, no guarding. EXTREMITIES: +3 bilateral edema, No cyanosis, no clubbing MUSCULOSKELETAL:  No deformity  SKIN: Warm and dry NEUROLOGIC:  Alert and oriented x 3, non-focal PSYCHIATRIC:  Normal affect, good insight  ASSESSMENT:    1. Diastolic heart failure due to valvular disease, unspecified heart failure chronicity ([place])   2. 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, with stage 1 through stage 4 chronic kidney disease, or unspecified chronic kidney disease    3. PAF (paroxysmal atrial fibrillation) ([place])   4. Diabetes mellitus type 2 without retinopathy ([place])   5. CKD stage 4 due to type 2 diabetes mellitus ([place])   6. Anticoagulant long-term use   7. Mixed hyperlipidemia    PLAN:     She is +3 bilateral leg edema and with the weight gain at home with a likely to acute the patient with doses of metolazone 5 mg daily.  I have educated the patient on how to take this medication.  She is fine to take 1 dose 30 minutes before her diuretic dosing.  Then she will wait 3 days to take the next metolazone 5 mg before her Lasix dosing. I am hoping this will help with taking some of the fluid off of her.  She follows closely with nephrology she is, seen a couple weeks. I reviewed her blood work.  Permanent atrial fibrillation continue her current medication regimen.  The patient is in agreement with the above plan. The patient left the office in stable condition.  The patient will follow up in 6 months or sooner if needed.   Medication Adjustments/Labs and Tests Ordered: Current medicines are reviewed at length with the patient today.  Concerns regarding medicines are outlined above.  Orders Placed This Encounter  Procedures   EKG 12-Lead   Meds ordered this encounter  Medications   metolazone (ZAROXOLYN) 5 MG tablet    Sig: Take 30 minutes before first Torsemide dose    Dispense:  2 tablet    Refill:  0    Patient Instructions  Medication Instructions:  Your physician has recommended you make the following change in your medication:  START: Metolazone 5 mg Thursday and Saturday (2 doses only)   *If you need a refill on your cardiac medications before your next appointment,  please call your pharmacy*   Lab Work: None If you have labs (blood work) drawn today and your tests are completely normal, you will receive your results only by: [place] (if you have MyChart) OR A paper copy in the mail If you have any lab test that is  abnormal or we need to change your treatment, we will call you to review the results.   Testing/Procedures: None   Follow-Up: At [facility], you and your health needs are our priority.  As part of our continuing mission to provide you with exceptional heart care, we have created designated Provider Care Teams.  These Care Teams include your primary Cardiologist (physician) and Advanced Practice Providers (APPs -  Physician Assistants and Nurse Practitioners) who all work together to provide you with the care you need, when you need it.  We recommend signing up for the patient portal called "MyChart".  Sign up information is provided on this After Visit Summary.  MyChart is used to connect with patients for Virtual Visits (Telemedicine).  Patients are able to view lab/test results, encounter notes, upcoming appointments, etc.  Non-urgent messages can be sent to your provider as well.   To learn more about what you can do with MyChart, go to [email or web address].    Your next appointment:   6 month(s)  The format for your next appointment:   In Person  Provider:   Berniece Salines, DO     Other Instructions     Adopting a Healthy Lifestyle.  Know what a healthy weight is for you (roughly BMI <25) and aim to maintain this   Aim for 7+ servings of fruits and vegetables daily   65-80+ fluid ounces of water or unsweet tea for healthy kidneys   Limit to max 1 drink of alcohol per day; avoid smoking/tobacco   Limit animal fats in diet for cholesterol and heart health - choose grass fed whenever available   Avoid highly processed foods, and foods high in saturated/trans fats   Aim for low stress - take time to unwind and care for your mental health   Aim for 150 min of moderate intensity exercise weekly for heart health, and weights twice weekly for bone health   Aim for 7-9 hours of sleep daily   When it comes to diets, agreement about the perfect plan isnt easy to find, even  among the experts. Experts at the [place] developed an idea known as the Healthy Eating Plate. Just imagine a plate divided into logical, healthy portions.   The emphasis is on diet quality:   Load up on vegetables and fruits - one-half of your plate: Aim for color and variety, and remember that potatoes dont count.   Go for whole grains - one-quarter of your plate: Whole wheat, barley, wheat berries, quinoa, oats, brown rice, and foods made with them. If you want pasta, go with whole wheat pasta.   Protein power - one-quarter of your plate: Fish, chicken, beans, and nuts are all healthy, versatile protein sources. Limit red meat.   The diet, however, does go beyond the plate, offering a few other suggestions.   Use healthy plant oils, such as olive, canola, soy, corn, sunflower and peanut. Check the labels, and avoid partially hydrogenated oil, which have unhealthy trans fats.   If youre thirsty, drink water. Coffee and tea are good in moderation, but skip sugary drinks and limit milk and dairy products to one or two daily servings.  The type of carbohydrate in the diet is more important than the amount. Some sources of carbohydrates, such as vegetables, fruits, whole grains, and beans-are healthier than others.   Finally, stay active  Signed, Berniece Salines, DO  04/11/2021 9:44 PM    [facility]

## 2021-04-10 NOTE — Patient Instructions (Addendum)
Medication Instructions:  Your physician has recommended you make the following change in your medication:  START: Metolazone 5 mg Thursday and Saturday (2 doses only)   *If you need a refill on your cardiac medications before your next appointment, please call your pharmacy*   Lab Work: None If you have labs (blood work) drawn today and your tests are completely normal, you will receive your results only by: [place] (if you have MyChart) OR A paper copy in the mail If you have any lab test that is abnormal or we need to change your treatment, we will call you to review the results.   Testing/Procedures: None   Follow-Up: At [facility], you and your health needs are our priority.  As part of our continuing mission to provide you with exceptional heart care, we have created designated Provider Care Teams.  These Care Teams include your primary Cardiologist (physician) and Advanced Practice Providers (APPs -  Physician Assistants and Nurse Practitioners) who all work together to provide you with the care you need, when you need it.  We recommend signing up for the patient portal called "MyChart".  Sign up information is provided on this After Visit Summary.  MyChart is used to connect with patients for Virtual Visits (Telemedicine).  Patients are able to view lab/test results, encounter notes, upcoming appointments, etc.  Non-urgent messages can be sent to your provider as well.   To learn more about what you can do with MyChart, go to [email or web address].    Your next appointment:   6 month(s)  The format for your next appointment:   In Person  Provider:   Berniece Salines, DO     Other Instructions

## 2021-04-11 ENCOUNTER — Other Ambulatory Visit: Payer: Self-pay | Admitting: Family Medicine

## 2021-04-11 ENCOUNTER — Ambulatory Visit (INDEPENDENT_AMBULATORY_CARE_PROVIDER_SITE_OTHER): Payer: Medicare Other

## 2021-04-11 DIAGNOSIS — M810 Age-related osteoporosis without current pathological fracture: Secondary | ICD-10-CM | POA: Diagnosis not present

## 2021-04-11 DIAGNOSIS — Z1231 Encounter for screening mammogram for malignant neoplasm of breast: Secondary | ICD-10-CM

## 2021-04-11 MED ORDER — DENOSUMAB 60 MG/ML ~~LOC~~ SOSY
60.0000 mg | PREFILLED_SYRINGE | Freq: Once | SUBCUTANEOUS | Status: AC
Start: 2021-04-11 — End: 2021-04-11
  Administered 2021-04-11: 60 mg via SUBCUTANEOUS

## 2021-04-11 NOTE — Progress Notes (Signed)
Patient comes in for her Prolia injection.  She has done well in the past with no adverse reactions.

## 2021-04-12 DIAGNOSIS — N170 Acute kidney failure with tubular necrosis: Secondary | ICD-10-CM | POA: Diagnosis not present

## 2021-04-12 DIAGNOSIS — Z794 Long term (current) use of insulin: Secondary | ICD-10-CM | POA: Diagnosis not present

## 2021-04-12 DIAGNOSIS — E1122 Type 2 diabetes mellitus with diabetic chronic kidney disease: Secondary | ICD-10-CM | POA: Diagnosis not present

## 2021-04-12 DIAGNOSIS — Z9181 History of falling: Secondary | ICD-10-CM

## 2021-04-12 DIAGNOSIS — E860 Dehydration: Secondary | ICD-10-CM | POA: Diagnosis not present

## 2021-04-12 DIAGNOSIS — I129 Hypertensive chronic kidney disease with stage 1 through stage 4 chronic kidney disease, or unspecified chronic kidney disease: Secondary | ICD-10-CM | POA: Diagnosis not present

## 2021-04-12 DIAGNOSIS — Z87440 Personal history of urinary (tract) infections: Secondary | ICD-10-CM | POA: Diagnosis not present

## 2021-04-12 DIAGNOSIS — N189 Chronic kidney disease, unspecified: Secondary | ICD-10-CM | POA: Diagnosis not present

## 2021-04-12 DIAGNOSIS — Z7984 Long term (current) use of oral hypoglycemic drugs: Secondary | ICD-10-CM | POA: Diagnosis not present

## 2021-04-12 DIAGNOSIS — Z7901 Long term (current) use of anticoagulants: Secondary | ICD-10-CM | POA: Diagnosis not present

## 2021-04-12 DIAGNOSIS — E785 Hyperlipidemia, unspecified: Secondary | ICD-10-CM | POA: Diagnosis not present

## 2021-04-12 DIAGNOSIS — I4891 Unspecified atrial fibrillation: Secondary | ICD-10-CM | POA: Diagnosis not present

## 2021-04-12 DIAGNOSIS — E1165 Type 2 diabetes mellitus with hyperglycemia: Secondary | ICD-10-CM | POA: Diagnosis not present

## 2021-04-15 ENCOUNTER — Other Ambulatory Visit: Payer: Self-pay

## 2021-04-15 ENCOUNTER — Telehealth: Payer: Self-pay | Admitting: Cardiology

## 2021-04-15 ENCOUNTER — Telehealth: Payer: Self-pay

## 2021-04-15 DIAGNOSIS — Z79899 Other long term (current) drug therapy: Secondary | ICD-10-CM

## 2021-04-15 MED ORDER — METOLAZONE 5 MG PO TABS: ORAL_TABLET | ORAL | 0 refills | Status: DC

## 2021-04-15 MED ORDER — LEVEMIR FLEXTOUCH 100 UNIT/ML ~~LOC~~ SOPN: PEN_INJECTOR | SUBCUTANEOUS | 1 refills | Status: DC

## 2021-04-15 NOTE — Telephone Encounter (Signed)
Spoke with pt after speaking with Dr. Harriet Masson. Pt will take Torsemide 20 mg twice daily for the next 3 days, she will also take Metolazone 5 mg once tomorrow. Pt will have labs drawn Thursday at the [place] office per Dr. Harriet Masson. She has an appointment with Nephrology on 12/27. Pt advised to keep the appointment and also to update them with the current swelling issues she has going on. Pt verbalized understating, no further concerns voiced at this time.

## 2021-04-15 NOTE — Telephone Encounter (Signed)
Pt c/o swelling: STAT is pt has developed SOB within 24 hours  If swelling, where is the swelling located? Feet and legs (both legs)  How much weight have you gained and in what time span? Not sure  Have you gained 3 pounds in a day or 5 pounds in a week? Not sure. She does not weigh  Do you have a log of your daily weights (if so, list)?   Are you currently taking a fluid pill?   Are you currently SOB? Yes   Have you traveled recently?   Pt c/o Shortness Of Breath: STAT if SOB developed within the last 24 hours or pt is noticeably SOB on the phone  1. Are you currently SOB (can you hear that pt is SOB on the phone)? no  2. How long have you been experiencing SOB? Not sure  3. Are you SOB when sitting or when up moving around? Up and moving around   4. Are you currently experiencing any other symptoms?

## 2021-04-15 NOTE — Telephone Encounter (Signed)
[name] called stating that [name] denied all services. She feels that she is back to her regular self. That being said Sara Hancock is discharging her.

## 2021-04-15 NOTE — Telephone Encounter (Signed)
Spoke with pt she states she took the Metolazone as directed with some relief "but not much." Pt states her symptoms might be a little worst now than when she was seen on 11/30. "My feet and legs are so swollen I can not wear any shoes." Pt was able to speak in complete sentences without distress on the phone. She was advised Dr. Harriet Masson is in the hospital today but the information will be relayed to her. She thanked me for calling her back.

## 2021-04-15 NOTE — Telephone Encounter (Signed)
Patient calling fro refill of Levemir. Currently taking 38 U in AM and 22 U in PM.   [place], [place] 04/15/21 8:24 AM

## 2021-04-18 ENCOUNTER — Other Ambulatory Visit: Payer: Self-pay | Admitting: *Deleted

## 2021-04-18 DIAGNOSIS — Z79899 Other long term (current) drug therapy: Secondary | ICD-10-CM

## 2021-04-19 ENCOUNTER — Other Ambulatory Visit: Payer: Self-pay | Admitting: Gastroenterology

## 2021-04-19 ENCOUNTER — Other Ambulatory Visit: Payer: Self-pay | Admitting: Family Medicine

## 2021-04-19 LAB — BASIC METABOLIC PANEL
BUN/Creatinine Ratio: 20 (ref 12–28)
BUN: 62 mg/dL — ABNORMAL HIGH (ref 8–27)
CO2: 20 mmol/L (ref 20–29)
Calcium: 7.9 mg/dL — ABNORMAL LOW (ref 8.7–10.3)
Chloride: 101 mmol/L (ref 96–106)
Creatinine, Ser: 3.08 mg/dL — ABNORMAL HIGH (ref 0.57–1.00)
Glucose: 86 mg/dL (ref 70–99)
Potassium: 4.9 mmol/L (ref 3.5–5.2)
Sodium: 137 mmol/L (ref 134–144)
eGFR: 14 mL/min/{1.73_m2} — ABNORMAL LOW (ref >59)

## 2021-04-19 LAB — MAGNESIUM: Magnesium: 2.5 mg/dL — ABNORMAL HIGH (ref 1.6–2.3)

## 2021-04-22 NOTE — Progress Notes (Signed)
Kidney function abnormal, but improved Iron studies normalized except ferritin is elevated. Please ask which specialist requested these labs so we can forward them.

## 2021-04-24 ENCOUNTER — Encounter: Payer: Self-pay | Admitting: Legal Medicine

## 2021-04-24 ENCOUNTER — Ambulatory Visit (INDEPENDENT_AMBULATORY_CARE_PROVIDER_SITE_OTHER): Payer: Medicare Other | Admitting: Legal Medicine

## 2021-04-24 ENCOUNTER — Other Ambulatory Visit: Payer: Self-pay

## 2021-04-24 DIAGNOSIS — I87302 Chronic venous hypertension (idiopathic) without complications of left lower extremity: Secondary | ICD-10-CM | POA: Diagnosis not present

## 2021-04-24 NOTE — Assessment & Plan Note (Signed)
The current medical regimen is effective;  continue present plan and medications. Continue eliquis and metoprolol

## 2021-04-24 NOTE — Assessment & Plan Note (Signed)
Continue pantoprazole. °

## 2021-04-24 NOTE — Progress Notes (Signed)
Acute Office Visit  Subjective:    Patient ID: Sara Hancock, female    DOB: 15-Sep-1934, 86 y.o.   MRN: [id number]  Chief Complaint  Patient presents with   Leg Injury    L lower leg open sore     HPI: Patient is in today for open sore on left lower leg. She went to urgent care 11/29 and was prescribed antibiotic. She has completed medication. Her torsemide was cut in 1.2 by renal 3 weeks ago.  She hurt her left pretibial are and it is healing, urgent put her on septra.  Now she is getting weeping for edematous left lower leg.  Past Medical History:  Diagnosis Date   Acquired thrombophilia ([place]) 09/11/2019   Allergic rhinitis 09/17/2015   Anemia    ANEMIA, NORMOCYTIC 10/27/2007   Qualifier: Diagnosis of  By: Nils Pyle CMA (AAMA), Leisha     Anticoagulant long-term use 01/08/2011   Arthritis    Benign neoplasm of colon 01/08/2011   Biliary cirrhosis ([place])    BRADYCARDIA 11/15/2008   Qualifier: Diagnosis of  By: Hollie Salk CMA, Amanda     Cellulitis of left foot 09/25/2016   Cholelithiasis 01/09/12   Chronic kidney disease, stage 3 (HCC)    Combined form of age-related cataract, both eyes 02/26/2017   Diabetes mellitus type 2 without retinopathy ([place]) 10/27/2013   Diverticulosis of colon with hemorrhage 06/08/2000   Dyslipidemia 09/17/2015   Encounter for therapeutic drug monitoring 06/09/2013   Esophageal reflux 01/08/2011   Esophageal stenosis 01/08/11   Esophagitis 01/08/11   GALLSTONES 10/26/2008   Qualifier: Diagnosis of  By: Sharlett Iles MD Byrd Hesselbach    Gastritis, erosive chronic 01/08/2011   GERD (gastroesophageal reflux disease)    Glaucoma suspect of both eyes 02/26/2017   Glaucoma suspect of both eyes 02/26/2017   Hiatal hernia 01/08/11   History of IBS 10/27/2007   Qualifier: Diagnosis of  By: Nils Pyle CMA (AAMA), Mearl Latin     HTN (hypertension)    Hx of adenomatous colonic polyps 01/08/11   Hyperplastic colon polyp 06/08/2000, 07/31/2003, 01/08/11   Hypertension, essential 10/27/2007    Qualifier: Diagnosis of  By: Nils Pyle CMA (AAMA), Mearl Latin     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, with stage 1 through stage 4 chronic kidney disease, or unspecified chronic kidney disease    Iron deficiency anemia 01/08/2011   Left foot pain 09/25/2016   Localized edema    Long term (current) use of insulin (HCC)    Mixed hyperlipidemia    Nuclear cataract 07/03/2011   Obesity    Obesity (BMI 30-39.9) 03/22/2012   Osteoarthritis of right thumb 09/17/2015   Osteopenia    Other iron deficiency anemias    Other specified menopausal and perimenopausal disorders    PAF (paroxysmal atrial fibrillation) (HCC)    occurring in the setting of bradycardia and mild first degree AV block followed by Dr. Jens Som   Pain in thoracic spine    Paraesophageal hiatal hernia, 3cm by EGD 03/22/2012   Pedal edema 12/13/2019   Persistent atrial fibrillation ([place]) 08/28/2019   Posterior vitreous detachment, bilateral 01/10/2016   Primary biliary cholangitis ([place]) 10/27/2007   Qualifier: Diagnosis of  By: Nils Pyle CMA Deborra Medina), Mearl Latin     Pure hypercholesterolemia    Retinal tear, right 07/03/2011   Shortness of breath 12/13/2019   Stage 3b chronic kidney disease ([place]) 09/11/2019   Type II or unspecified type diabetes mellitus without mention of complication, not stated as uncontrolled  Uterine fibroid 01/09/12    Past Surgical History:  Procedure Laterality Date   BREAST CYST EXCISION     left   CARDIOVERSION N/A 11/27/2017   Procedure: CARDIOVERSION;  Surgeon: Sanda Klein, MD;  Location: [facility];  Service: Cardiovascular;  Laterality: N/A;   COLONOSCOPY     INSERTION OF MESH  05/18/2012   Procedure: INSERTION OF MESH;  Surgeon: Adin Hector, MD;  Location: [facility];  Service: General;  Laterality: N/A;   LIVER BIOPSY  05/18/2012   Procedure: LIVER BIOPSY;  Surgeon: Adin Hector, MD;  Location: [facility];  Service: General;;   POLYPECTOMY     THYROIDECTOMY, [place]  05/18/2012   Procedure: LAPAROSCOPIC VENTRAL HERNIA;  Surgeon: Adin Hector, MD;  Location: [facility];  Service: General;  Laterality: N/A;  Laparoscopic Ventral Wall Hernia with Mesh    Family History  Problem Relation Age of Onset   Heart attack Father    Stroke Mother    Diabetes Sister        x 2   Aneurysm Sister    Stroke Sister    Congestive Heart Failure Sister    Diabetes Brother    Kidney failure Other    Hyperlipidemia Other    Arrhythmia Other    Congestive Heart Failure Other    Hypertension Other    Arthritis Other    Colon cancer Neg Hx    Esophageal cancer Neg Hx    Stomach cancer Neg Hx    Rectal cancer Neg Hx     Social History   Socioeconomic History   Marital status: Married    Spouse name: Not on file   Number of children: 2   Years of education: Not on file   Highest education level: Not on file  Occupational History   Occupation: retired    Fish farm manager: RETIRED  Tobacco Use   Smoking status: Never   Smokeless tobacco: Never  Vaping Use   Vaping Use: Never used  Substance and Sexual Activity   Alcohol use: No   Drug use: No   Sexual activity: Not Currently  Other Topics Concern   Not on file  Social History Narrative   Not on file   Social Determinants of Health   Financial Resource Strain: Not on file  Food Insecurity: Not on file  Transportation Needs: Not on file  Physical Activity: Not on file  Stress: Not on file  Social Connections: Not on file  Intimate Partner Violence: Not on file    Outpatient Medications Prior to Visit  Medication Sig Dispense Refill   ACCU-CHEK AVIVA PLUS test strip USE TO TEST TWICE DAILY 200 strip 3   Accu-Chek FastClix Lancets MISC USE TO CHECK BLOOD SUGAR TWICE DAILY 204 each 3   apixaban (ELIQUIS) 2.5 MG TABS tablet Take 1 tablet (2.5 mg total) by mouth 2 (two) times daily. 180 tablet 1   calcium-vitamin D (OSCAL WITH D) 250-125 MG-UNIT per tablet Take 1  tablet by mouth daily.     Cholecalciferol (VITAMIN D3) 1000 units CAPS Take 1,000 Units by mouth daily.     fexofenadine (ALLEGRA ALLERGY) 60 MG tablet Take 1 tablet (60 mg total) by mouth 2 (two) times daily. 180 tablet 1   fluticasone (FLONASE) 50 MCG/ACT nasal spray Place 1 spray into both nostrils as needed.      folic acid (FOLVITE) 195 MCG tablet Take 400 mcg  by mouth daily.     hydrALAZINE (APRESOLINE) 100 MG tablet Take 1 tablet (100 mg total) by mouth 2 (two) times daily. 180 tablet 0   insulin detemir (LEVEMIR FLEXTOUCH) 100 UNIT/ML FlexPen INJECT 38 UNITS UNDER THE SKIN EVERY MORNING AND 22 UNITS WITH SUPPER 45 mL 1   Insulin Pen Needle (BD PEN NEEDLE NANO 2ND GEN) 32G X 4 MM MISC Use a new needle each time when checking FBS 200 each 3   irbesartan (AVAPRO) 150 MG tablet Take 150 mg by mouth daily.     metolazone (ZAROXOLYN) 5 MG tablet Take 30 minutes before first Torsemide dose 1 tablet 0   metoprolol tartrate (LOPRESSOR) 100 MG tablet TAKE 1 TABLET(100 MG) BY MOUTH TWICE DAILY 180 tablet 3   Multiple Vitamin (MULTIVITAMIN WITH MINERALS) TABS Take 1 tablet by mouth daily.     nystatin cream (MYCOSTATIN) APPLY SUFFICIENT AMOUNT EXTERNALLY TO THE AFFECTED AREA TWICE DAILY 30 g 1   pantoprazole (PROTONIX) 40 MG tablet TAKE 1 TABLET BY MOUTH EVERY DAY 90 tablet 3   simvastatin (ZOCOR) 40 MG tablet TAKE 1 TABLET BY MOUTH AT BEDTIME 90 tablet 1   spironolactone (ALDACTONE) 25 MG tablet Take 25 mg by mouth daily.     torsemide (DEMADEX) 20 MG tablet TAKE 2 TABLETS BY MOUTH TWICE DAILY (Patient taking differently: 20 mg daily.) 360 tablet 0   ursodiol (ACTIGALL) 300 MG capsule TAKE 1 CAPSULE(300 MG) BY MOUTH TWICE DAILY 180 capsule 0   vitamin C (ASCORBIC ACID) 500 MG tablet Take 500 mg by mouth daily.     vitamin E 400 UNIT capsule Take 400 Units by mouth daily.     No facility-administered medications prior to visit.    Allergies  Allergen Reactions   Ace Inhibitors Anaphylaxis    Capoten [Captopril] Anaphylaxis    Throat swells shut   Neomycin-Bacitracin Zn-Polymyx Rash    Review of Systems  Constitutional:  Negative for activity change and appetite change.  HENT:  Negative for congestion.   Respiratory:  Negative for chest tightness and shortness of breath.   Cardiovascular:  Positive for leg swelling. Negative for chest pain.  Gastrointestinal:  Negative for abdominal distention and abdominal pain.  Genitourinary: Negative.  Negative for difficulty urinating and dysuria.  Musculoskeletal:  Negative for arthralgias and back pain.  Skin:  Positive for wound.       Pitting edema  Neurological: Negative.   Psychiatric/Behavioral: Negative.        Objective:    Physical Exam Vitals reviewed.  Constitutional:      Appearance: Normal appearance.  HENT:     Right Ear: Tympanic membrane normal.     Left Ear: Tympanic membrane normal.  Eyes:     Extraocular Movements: Extraocular movements intact.     Conjunctiva/sclera: Conjunctivae normal.     Pupils: Pupils are equal, round, and reactive to light.  Cardiovascular:     Rate and Rhythm: Normal rate and regular rhythm.     Pulses: Normal pulses.     Heart sounds: No murmur heard.   No gallop.  Pulmonary:     Effort: Pulmonary effort is normal. No respiratory distress.     Breath sounds: Normal breath sounds. No wheezing.  Abdominal:     General: Abdomen is flat. Bowel sounds are normal.     Palpations: Abdomen is soft.  Musculoskeletal:     Cervical back: Normal range of motion.     Right lower leg: No edema.  Left lower leg: No edema.  Skin:    General: Skin is warm.     Capillary Refill: Capillary refill takes less than 2 seconds.     Comments: 3+ pitting edema  Neurological:     General: No focal deficit present.     Mental Status: She is alert. Mental status is at baseline.    BP 122/72    Pulse 97    Resp 18    Ht _0  (1.575 m)    Wt 168 lb (76.2 kg)    SpO2 97%    BMI 30.73 kg/m   Wt Readings from Last 3 Encounters:  04/24/21 168 lb (76.2 kg)  04/10/21 165 lb 12.8 oz (75.2 kg)  03/28/21 158 lb 3.2 oz (71.8 kg)    There are no preventive care reminders to display for this patient.  There are no preventive care reminders to display for this patient.   Lab Results  Component Value Date   TSH 2.020 09/10/2020   Lab Results  Component Value Date   WBC 6.6 03/25/2021   HGB 9.4 (L) 03/25/2021   HCT 28.0 (L) 03/25/2021   MCV 95 03/25/2021   PLT 281 03/25/2021   Lab Results  Component Value Date   NA 137 04/18/2021   K 4.9 04/18/2021   CO2 20 04/18/2021   GLUCOSE 86 04/18/2021   BUN 62 (H) 04/18/2021   CREATININE 3.08 (H) 04/18/2021   BILITOT 0.4 03/25/2021   ALKPHOS 280 (H) 03/25/2021   AST 21 03/25/2021   ALT 15 03/25/2021   PROT 6.7 03/25/2021   ALBUMIN 3.9 04/08/2021   CALCIUM 7.9 (L) 04/18/2021   EGFR 14 (L) 04/18/2021   GFR 25.57 (L) 05/17/2020   Lab Results  Component Value Date   CHOL 123 03/25/2021   Lab Results  Component Value Date   HDL 47 03/25/2021   Lab Results  Component Value Date   LDLCALC 57 03/25/2021   Lab Results  Component Value Date   TRIG 100 03/25/2021   Lab Results  Component Value Date   CHOLHDL 2.6 03/25/2021   Lab Results  Component Value Date   HGBA1C 9.7 (H) 03/25/2021       Assessment & Plan:   Problem List Items Addressed This Visit       Other   Stasis edema of left lower extremity Healing lesion left lower leg with some new weeping no infection Apply Unna boot, recheck Monday          Follow-up: Return in about 5 days (around 04/29/2021) for edema legs.  An After Visit Summary was printed and given to the patient.  Reinaldo Meeker, MD [facility] [phone]

## 2021-04-24 NOTE — Assessment & Plan Note (Signed)
Control: poor Recommend check sugars qac and qhs Recommend check feet daily. Recommend annual eye exams. Medicines: cocntinue farxiga 10 mg daily, levemir 32 U in am and 18 U at supper.  Continue to work on eating a healthy diet and exercise.  Labs drawn today.

## 2021-04-24 NOTE — Assessment & Plan Note (Signed)
Continue ursodiol. Management per specialist.   

## 2021-04-24 NOTE — Assessment & Plan Note (Signed)
Well controlled.  ?No changes to medicines.  ?Continue to work on eating a healthy diet and exercise.  ?Labs drawn today.  ?

## 2021-04-25 ENCOUNTER — Telehealth: Payer: Self-pay | Admitting: Family Medicine

## 2021-04-25 NOTE — Chronic Care Management (AMB) (Signed)
°  Chronic Care Management   Outreach Note  04/25/2021 Name: Sara Hancock MRN: [id number] DOB: 1934/11/02  Referred by: Rochel Brome, MD Reason for referral : No chief complaint on file.   An unsuccessful telephone outreach was attempted today. The patient was referred to the pharmacist for assistance with care management and care coordination.   Follow Up Plan:   Tatjana Dellinger Upstream Scheduler

## 2021-04-29 ENCOUNTER — Ambulatory Visit (INDEPENDENT_AMBULATORY_CARE_PROVIDER_SITE_OTHER): Payer: Medicare Other | Admitting: Legal Medicine

## 2021-04-29 ENCOUNTER — Encounter: Payer: Self-pay | Admitting: Legal Medicine

## 2021-04-29 ENCOUNTER — Other Ambulatory Visit: Payer: Self-pay

## 2021-04-29 VITALS — BP 120/80 | HR 120 | Temp 97.2°F | Resp 16 | Ht 62.0 in | Wt 173.0 lb

## 2021-04-29 DIAGNOSIS — N184 Chronic kidney disease, stage 4 (severe): Secondary | ICD-10-CM

## 2021-04-29 DIAGNOSIS — Z79899 Other long term (current) drug therapy: Secondary | ICD-10-CM | POA: Diagnosis not present

## 2021-04-29 DIAGNOSIS — I361 Nonrheumatic tricuspid (valve) insufficiency: Secondary | ICD-10-CM | POA: Diagnosis not present

## 2021-04-29 DIAGNOSIS — I5033 Acute on chronic diastolic (congestive) heart failure: Secondary | ICD-10-CM | POA: Diagnosis not present

## 2021-04-29 DIAGNOSIS — K8020 Calculus of gallbladder without cholecystitis without obstruction: Secondary | ICD-10-CM | POA: Diagnosis not present

## 2021-04-29 DIAGNOSIS — U071 COVID-19: Secondary | ICD-10-CM | POA: Diagnosis not present

## 2021-04-29 DIAGNOSIS — I340 Nonrheumatic mitral (valve) insufficiency: Secondary | ICD-10-CM | POA: Diagnosis not present

## 2021-04-29 DIAGNOSIS — Z7901 Long term (current) use of anticoagulants: Secondary | ICD-10-CM | POA: Diagnosis not present

## 2021-04-29 DIAGNOSIS — R609 Edema, unspecified: Secondary | ICD-10-CM | POA: Diagnosis not present

## 2021-04-29 DIAGNOSIS — Z888 Allergy status to other drugs, medicaments and biological substances status: Secondary | ICD-10-CM | POA: Diagnosis not present

## 2021-04-29 DIAGNOSIS — Z6832 Body mass index (BMI) 32.0-32.9, adult: Secondary | ICD-10-CM | POA: Diagnosis not present

## 2021-04-29 DIAGNOSIS — I480 Paroxysmal atrial fibrillation: Secondary | ICD-10-CM

## 2021-04-29 DIAGNOSIS — R0602 Shortness of breath: Secondary | ICD-10-CM | POA: Diagnosis not present

## 2021-04-29 DIAGNOSIS — I509 Heart failure, unspecified: Secondary | ICD-10-CM | POA: Diagnosis not present

## 2021-04-29 DIAGNOSIS — I4891 Unspecified atrial fibrillation: Secondary | ICD-10-CM | POA: Diagnosis not present

## 2021-04-29 DIAGNOSIS — K219 Gastro-esophageal reflux disease without esophagitis: Secondary | ICD-10-CM | POA: Diagnosis not present

## 2021-04-29 DIAGNOSIS — I5031 Acute diastolic (congestive) heart failure: Secondary | ICD-10-CM

## 2021-04-29 DIAGNOSIS — E1165 Type 2 diabetes mellitus with hyperglycemia: Secondary | ICD-10-CM | POA: Diagnosis not present

## 2021-04-29 DIAGNOSIS — E1122 Type 2 diabetes mellitus with diabetic chronic kidney disease: Secondary | ICD-10-CM | POA: Diagnosis not present

## 2021-04-29 DIAGNOSIS — I517 Cardiomegaly: Secondary | ICD-10-CM | POA: Diagnosis not present

## 2021-04-29 DIAGNOSIS — N179 Acute kidney failure, unspecified: Secondary | ICD-10-CM | POA: Diagnosis not present

## 2021-04-29 DIAGNOSIS — E669 Obesity, unspecified: Secondary | ICD-10-CM | POA: Diagnosis not present

## 2021-04-29 DIAGNOSIS — I272 Pulmonary hypertension, unspecified: Secondary | ICD-10-CM | POA: Diagnosis not present

## 2021-04-29 DIAGNOSIS — D631 Anemia in chronic kidney disease: Secondary | ICD-10-CM | POA: Diagnosis not present

## 2021-04-29 DIAGNOSIS — I130 Hypertensive heart and chronic kidney disease with heart failure and stage 1 through stage 4 chronic kidney disease, or unspecified chronic kidney disease: Secondary | ICD-10-CM | POA: Diagnosis not present

## 2021-04-29 DIAGNOSIS — N183 Chronic kidney disease, stage 3 unspecified: Secondary | ICD-10-CM | POA: Diagnosis not present

## 2021-04-29 DIAGNOSIS — I110 Hypertensive heart disease with heart failure: Secondary | ICD-10-CM | POA: Diagnosis not present

## 2021-04-29 NOTE — Progress Notes (Signed)
Established Patient Office Visit  Subjective:  Patient ID: Sara Hancock, female    DOB: [birth year]  Age: 86 y.o. MRN: [id number]  CC:  Chief Complaint  Patient presents with   Edema   Shortness of Breath    HPI Sara Hancock presents for dyspnea on exertion.  Unable to walk in house.  Increased pedal edema.  Worsening SOB over last week. Using more pillows at night.  No chest pain. Weight up 15lbs.  She is on torsemide 68m a day, renal doctor decreased it for 864ma day one month ago. She is on spironolactone , metolozone, irbesartan.  Past Medical History:  Diagnosis Date   Acquired thrombophilia ([place]/06/2019   Allergic rhinitis 09/17/2015   Anemia    ANEMIA, NORMOCYTIC 10/27/2007   Qualifier: Diagnosis of  By: KoNils PyleMA (AAMA), Leisha     Anticoagulant long-term use 01/08/2011   Arthritis    Benign neoplasm of colon 01/08/2011   Biliary cirrhosis ([place]   BRADYCARDIA 11/15/2008   Qualifier: Diagnosis of  By: TrHollie SalkMA, Amanda     Cellulitis of left foot 09/25/2016   Cholelithiasis 01/09/12   Chronic kidney disease, stage 3 (HCC)    Combined form of age-related cataract, both eyes 02/26/2017   Diabetes mellitus type 2 without retinopathy ([place]6/18/2015   Diverticulosis of colon with hemorrhage 06/08/2000   Dyslipidemia 09/17/2015   Encounter for therapeutic drug monitoring 06/09/2013   Esophageal reflux 01/08/2011   Esophageal stenosis 01/08/11   Esophagitis 01/08/11   GALLSTONES 10/26/2008   Qualifier: Diagnosis of  By: PaSharlett IlesD FAByrd Hesselbach  Gastritis, erosive chronic 01/08/2011   GERD (gastroesophageal reflux disease)    Glaucoma suspect of both eyes 02/26/2017   Glaucoma suspect of both eyes 02/26/2017   Hiatal hernia 01/08/11   History of IBS 10/27/2007   Qualifier: Diagnosis of  By: KoNils PyleMA (AAMA), LeMearl Latin   HTN (hypertension)    Hx of adenomatous colonic polyps 01/08/11   Hyperplastic colon polyp 06/08/2000, 07/31/2003, 01/08/11   Hypertension,  essential 10/27/2007   Qualifier: Diagnosis of  By: KoNils PyleMA (AAMA), LeMearl Latin   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, with stage 1 through stage 4 chronic kidney disease, or unspecified chronic kidney disease    Iron deficiency anemia 01/08/2011   Left foot pain 09/25/2016   Localized edema    Long term (current) use of insulin (HCC)    Mixed hyperlipidemia    Nuclear cataract 07/03/2011   Obesity    Obesity (BMI 30-39.9) 03/22/2012   Osteoarthritis of right thumb 09/17/2015   Osteopenia    Other iron deficiency anemias    Other specified menopausal and perimenopausal disorders    PAF (paroxysmal atrial fibrillation) (HCC)    occurring in the setting of bradycardia and mild first degree AV block followed by Dr. KlJens Som Pain in thoracic spine    Paraesophageal hiatal hernia, 3cm by EGD 03/22/2012   Pedal edema 12/13/2019   Persistent atrial fibrillation ([place]4/18/2021   Posterior vitreous detachment, bilateral 01/10/2016   Primary biliary cholangitis ([place]6/17/2009   Qualifier: Diagnosis of  By: KoNils PyleMA (ADeborra Medina LeMearl Latin   Pure hypercholesterolemia    Retinal tear, right 07/03/2011   Shortness of breath 12/13/2019   Stage 3b chronic kidney disease ([place]5/06/2019   Type II or unspecified type diabetes mellitus without mention of complication, not stated as uncontrolled    Uterine  fibroid 01/09/12    Past Surgical History:  Procedure Laterality Date   BREAST CYST EXCISION     left   CARDIOVERSION N/A 11/27/2017   Procedure: CARDIOVERSION;  Surgeon: Sanda Klein, MD;  Location: [facility];  Service: Cardiovascular;  Laterality: N/A;   COLONOSCOPY     INSERTION OF MESH  05/18/2012   Procedure: INSERTION OF MESH;  Surgeon: Adin Hector, MD;  Location: [facility];  Service: General;  Laterality: N/A;   LIVER BIOPSY  05/18/2012   Procedure: LIVER BIOPSY;  Surgeon: Adin Hector, MD;  Location: [facility];  Service: General;;   POLYPECTOMY     THYROIDECTOMY,  [place]  05/18/2012   Procedure: LAPAROSCOPIC VENTRAL HERNIA;  Surgeon: Adin Hector, MD;  Location: [facility];  Service: General;  Laterality: N/A;  Laparoscopic Ventral Wall Hernia with Mesh    Family History  Problem Relation Age of Onset   Heart attack Father    Stroke Mother    Diabetes Sister        x 2   Aneurysm Sister    Stroke Sister    Congestive Heart Failure Sister    Diabetes Brother    Kidney failure Other    Hyperlipidemia Other    Arrhythmia Other    Congestive Heart Failure Other    Hypertension Other    Arthritis Other    Colon cancer Neg Hx    Esophageal cancer Neg Hx    Stomach cancer Neg Hx    Rectal cancer Neg Hx     Social History   Socioeconomic History   Marital status: Married    Spouse name: Not on file   Number of children: 2   Years of education: Not on file   Highest education level: Not on file  Occupational History   Occupation: retired    Fish farm manager: RETIRED  Tobacco Use   Smoking status: Never   Smokeless tobacco: Never  Vaping Use   Vaping Use: Never used  Substance and Sexual Activity   Alcohol use: No   Drug use: No   Sexual activity: Not Currently  Other Topics Concern   Not on file  Social History Narrative   Not on file   Social Determinants of Health   Financial Resource Strain: Not on file  Food Insecurity: Not on file  Transportation Needs: Not on file  Physical Activity: Not on file  Stress: Not on file  Social Connections: Not on file  Intimate Partner Violence: Not on file    Outpatient Medications Prior to Visit  Medication Sig Dispense Refill   ACCU-CHEK AVIVA PLUS test strip USE TO TEST TWICE DAILY 200 strip 3   Accu-Chek FastClix Lancets MISC USE TO CHECK BLOOD SUGAR TWICE DAILY 204 each 3   apixaban (ELIQUIS) 2.5 MG TABS tablet Take 1 tablet (2.5 mg total) by mouth 2 (two) times daily. 180 tablet 1   calcium-vitamin D (OSCAL WITH D) 250-125 MG-UNIT  per tablet Take 1 tablet by mouth daily.     Cholecalciferol (VITAMIN D3) 1000 units CAPS Take 1,000 Units by mouth daily.     fexofenadine (ALLEGRA ALLERGY) 60 MG tablet Take 1 tablet (60 mg total) by mouth 2 (two) times daily. 180 tablet 1   fluticasone (FLONASE) 50 MCG/ACT nasal spray Place 1 spray into both nostrils as needed.      folic acid (FOLVITE) 102 MCG tablet Take 400 mcg by  mouth daily.     hydrALAZINE (APRESOLINE) 100 MG tablet Take 1 tablet (100 mg total) by mouth 2 (two) times daily. 180 tablet 0   insulin detemir (LEVEMIR FLEXTOUCH) 100 UNIT/ML FlexPen INJECT 38 UNITS UNDER THE SKIN EVERY MORNING AND 22 UNITS WITH SUPPER 45 mL 1   Insulin Pen Needle (BD PEN NEEDLE NANO 2ND GEN) 32G X 4 MM MISC Use a new needle each time when checking FBS 200 each 3   irbesartan (AVAPRO) 150 MG tablet Take 150 mg by mouth daily.     metolazone (ZAROXOLYN) 5 MG tablet Take 30 minutes before first Torsemide dose 1 tablet 0   metoprolol tartrate (LOPRESSOR) 100 MG tablet TAKE 1 TABLET(100 MG) BY MOUTH TWICE DAILY 180 tablet 3   Multiple Vitamin (MULTIVITAMIN WITH MINERALS) TABS Take 1 tablet by mouth daily.     nystatin cream (MYCOSTATIN) APPLY SUFFICIENT AMOUNT EXTERNALLY TO THE AFFECTED AREA TWICE DAILY 30 g 1   pantoprazole (PROTONIX) 40 MG tablet TAKE 1 TABLET BY MOUTH EVERY DAY 90 tablet 3   simvastatin (ZOCOR) 40 MG tablet TAKE 1 TABLET BY MOUTH AT BEDTIME 90 tablet 1   spironolactone (ALDACTONE) 25 MG tablet Take 25 mg by mouth daily.     torsemide (DEMADEX) 20 MG tablet TAKE 2 TABLETS BY MOUTH TWICE DAILY (Patient taking differently: 20 mg daily.) 360 tablet 0   ursodiol (ACTIGALL) 300 MG capsule TAKE 1 CAPSULE(300 MG) BY MOUTH TWICE DAILY 180 capsule 0   vitamin C (ASCORBIC ACID) 500 MG tablet Take 500 mg by mouth daily.     vitamin E 400 UNIT capsule Take 400 Units by mouth daily.     No facility-administered medications prior to visit.    Allergies  Allergen Reactions   Ace  Inhibitors Anaphylaxis   Capoten [Captopril] Anaphylaxis    Throat swells shut   Neomycin-Bacitracin Zn-Polymyx Rash    ROS Review of Systems  Constitutional:  Positive for activity change.  HENT:  Positive for congestion.   Respiratory:  Positive for shortness of breath.   Cardiovascular:  Negative for palpitations and leg swelling.  Gastrointestinal:  Negative for abdominal distention and abdominal pain.     Objective:    Physical Exam Vitals reviewed.  Constitutional:      General: She is in acute distress.     Appearance: Normal appearance. She is obese.  HENT:     Right Ear: Tympanic membrane normal.     Left Ear: Tympanic membrane normal.     Nose: Nose normal.     Mouth/Throat:     Mouth: Mucous membranes are moist.  Eyes:     Conjunctiva/sclera: Conjunctivae normal.     Pupils: Pupils are equal, round, and reactive to light.  Cardiovascular:     Rate and Rhythm: Normal rate. Rhythm irregular.     Pulses: Normal pulses.     Heart sounds: Normal heart sounds. No murmur heard.   No gallop.  Pulmonary:     Effort: Pulmonary effort is normal. No respiratory distress.     Breath sounds: No wheezing.     Comments: Dullness at bases Abdominal:     General: Abdomen is flat. Bowel sounds are normal. There is no distension.     Tenderness: There is no abdominal tenderness.  Musculoskeletal:        General: Normal range of motion.     Cervical back: Normal range of motion.     Right lower leg: Edema present.  Left lower leg: Edema present.  Skin:    General: Skin is warm.     Capillary Refill: Capillary refill takes less than 2 seconds.  Neurological:     General: No focal deficit present.     Mental Status: She is alert and oriented to person, place, and time. Mental status is at baseline.    BP 120/80    Pulse (!) 120    Temp (!) 97.2 F (36.2 C)    Resp 16    Ht 5' 2" (1.575 m)    Wt 173 lb (78.5 kg)    SpO2 91%    BMI 31.64 kg/m  Wt Readings from Last 3  Encounters:  04/29/21 173 lb (78.5 kg)  04/24/21 168 lb (76.2 kg)  04/10/21 165 lb 12.8 oz (75.2 kg)     Health Maintenance Due  Topic Date Due   OPHTHALMOLOGY EXAM  04/26/2021    There are no preventive care reminders to display for this patient.  Lab Results  Component Value Date   TSH 2.020 09/10/2020   Lab Results  Component Value Date   WBC 6.6 03/25/2021   HGB 9.4 (L) 03/25/2021   HCT 28.0 (L) 03/25/2021   MCV 95 03/25/2021   PLT 281 03/25/2021   Lab Results  Component Value Date   NA 137 04/18/2021   K 4.9 04/18/2021   CO2 20 04/18/2021   GLUCOSE 86 04/18/2021   BUN 62 (H) 04/18/2021   CREATININE 3.08 (H) 04/18/2021   BILITOT 0.4 03/25/2021   ALKPHOS 280 (H) 03/25/2021   AST 21 03/25/2021   ALT 15 03/25/2021   PROT 6.7 03/25/2021   ALBUMIN 3.9 04/08/2021   CALCIUM 7.9 (L) 04/18/2021   EGFR 14 (L) 04/18/2021   GFR 25.57 (L) 05/17/2020   Lab Results  Component Value Date   CHOL 123 03/25/2021   Lab Results  Component Value Date   HDL 47 03/25/2021   Lab Results  Component Value Date   LDLCALC 57 03/25/2021   Lab Results  Component Value Date   TRIG 100 03/25/2021   Lab Results  Component Value Date   CHOLHDL 2.6 03/25/2021   Lab Results  Component Value Date   HGBA1C 9.7 (H) 03/25/2021      Assessment & Plan:   Problem List Items Addressed This Visit       Cardiovascular and Mediastinum   PAF (paroxysmal atrial fibrillation) ([place]) - Primary Patient appears to be in atrial fibillation clinically    Acute diastolic heart failure ([place]) Patient is in acute CHF, she ha sgained 15 lbs, 4+ pedal =edema and dullness basilar lungs     Other   Anemia due to stage 4 chronic kidney disease ([place]) Patient has stage 4 renal disease with nephrology cutting diuretic latelym eGFR 14      Follow-up: No follow-ups on file.    Reinaldo Meeker, MD

## 2021-04-30 NOTE — Addendum Note (Signed)
Addended by: Thompson Caul I on: 04/30/2021 10:35 AM   Modules accepted: Orders

## 2021-05-01 ENCOUNTER — Telehealth: Payer: Self-pay | Admitting: Family Medicine

## 2021-05-01 NOTE — Chronic Care Management (AMB) (Signed)
°  Chronic Care Management   Outreach Note  05/01/2021 Name: SERRINA MINOGUE MRN: [id number] DOB: 03-05-1935  Referred by: Rochel Brome, MD Reason for referral : No chief complaint on file.   A second unsuccessful telephone outreach was attempted today. The patient was referred to pharmacist for assistance with care management and care coordination.  Follow Up Plan:   Tatjana Dellinger Upstream Scheduler

## 2021-05-07 ENCOUNTER — Telehealth: Payer: Self-pay | Admitting: Family Medicine

## 2021-05-07 DIAGNOSIS — D631 Anemia in chronic kidney disease: Secondary | ICD-10-CM | POA: Diagnosis not present

## 2021-05-07 DIAGNOSIS — D509 Iron deficiency anemia, unspecified: Secondary | ICD-10-CM | POA: Diagnosis not present

## 2021-05-07 DIAGNOSIS — I5030 Unspecified diastolic (congestive) heart failure: Secondary | ICD-10-CM | POA: Diagnosis not present

## 2021-05-07 DIAGNOSIS — E1122 Type 2 diabetes mellitus with diabetic chronic kidney disease: Secondary | ICD-10-CM | POA: Diagnosis not present

## 2021-05-07 DIAGNOSIS — E785 Hyperlipidemia, unspecified: Secondary | ICD-10-CM | POA: Diagnosis not present

## 2021-05-07 DIAGNOSIS — I129 Hypertensive chronic kidney disease with stage 1 through stage 4 chronic kidney disease, or unspecified chronic kidney disease: Secondary | ICD-10-CM | POA: Diagnosis not present

## 2021-05-07 DIAGNOSIS — N184 Chronic kidney disease, stage 4 (severe): Secondary | ICD-10-CM | POA: Diagnosis not present

## 2021-05-07 NOTE — Chronic Care Management (AMB) (Signed)
°  Chronic Care Management   Outreach Note  05/07/2021 Name: Sara Hancock MRN: [id number] DOB: Mar 29, 1935  Referred by: Rochel Brome, MD Reason for referral : No chief complaint on file.   Third unsuccessful telephone outreach was attempted today. The patient was referred to the pharmacist for assistance with care management and care coordination.   Follow Up Plan:   Tatjana Dellinger Upstream Scheduler

## 2021-05-08 ENCOUNTER — Telehealth: Payer: Self-pay

## 2021-05-08 DIAGNOSIS — E669 Obesity, unspecified: Secondary | ICD-10-CM | POA: Diagnosis not present

## 2021-05-08 DIAGNOSIS — U071 COVID-19: Secondary | ICD-10-CM | POA: Diagnosis not present

## 2021-05-08 DIAGNOSIS — N179 Acute kidney failure, unspecified: Secondary | ICD-10-CM | POA: Diagnosis not present

## 2021-05-08 DIAGNOSIS — K8020 Calculus of gallbladder without cholecystitis without obstruction: Secondary | ICD-10-CM | POA: Diagnosis not present

## 2021-05-08 DIAGNOSIS — K219 Gastro-esophageal reflux disease without esophagitis: Secondary | ICD-10-CM | POA: Diagnosis not present

## 2021-05-08 DIAGNOSIS — N1832 Chronic kidney disease, stage 3b: Secondary | ICD-10-CM | POA: Diagnosis not present

## 2021-05-08 DIAGNOSIS — I130 Hypertensive heart and chronic kidney disease with heart failure and stage 1 through stage 4 chronic kidney disease, or unspecified chronic kidney disease: Secondary | ICD-10-CM | POA: Diagnosis not present

## 2021-05-08 DIAGNOSIS — Z602 Problems related to living alone: Secondary | ICD-10-CM | POA: Diagnosis not present

## 2021-05-08 DIAGNOSIS — Z7901 Long term (current) use of anticoagulants: Secondary | ICD-10-CM | POA: Diagnosis not present

## 2021-05-08 DIAGNOSIS — E1122 Type 2 diabetes mellitus with diabetic chronic kidney disease: Secondary | ICD-10-CM | POA: Diagnosis not present

## 2021-05-08 DIAGNOSIS — I5031 Acute diastolic (congestive) heart failure: Secondary | ICD-10-CM | POA: Diagnosis not present

## 2021-05-08 DIAGNOSIS — I4891 Unspecified atrial fibrillation: Secondary | ICD-10-CM | POA: Diagnosis not present

## 2021-05-08 DIAGNOSIS — Z794 Long term (current) use of insulin: Secondary | ICD-10-CM | POA: Diagnosis not present

## 2021-05-08 DIAGNOSIS — I272 Pulmonary hypertension, unspecified: Secondary | ICD-10-CM | POA: Diagnosis not present

## 2021-05-08 DIAGNOSIS — Z6832 Body mass index (BMI) 32.0-32.9, adult: Secondary | ICD-10-CM | POA: Diagnosis not present

## 2021-05-08 NOTE — Telephone Encounter (Signed)
[facility] w/ [facility] requesting verbal orders. Pt was sent to hospital, dx with CHF. [facility] is requesting to do nursing visit twice a week for two weeks then once a week for six weeks. Gave verbal orders for this schedule.   [name], [place] 05/08/21 4:03 PM

## 2021-05-10 ENCOUNTER — Telehealth: Payer: Self-pay | Admitting: Family Medicine

## 2021-05-10 NOTE — Chronic Care Management (AMB) (Signed)
°  Chronic Care Management   Note  05/10/2021 Name: Sara Hancock MRN: [id number] DOB: June 07, 1934  Sara Hancock is a 86 y.o. year old female who is a primary care patient of Cox, Kirsten, MD. I reached out to [name] by phone today in response to a referral sent by Sara Hancock's PCP, Cox, Kirsten, MD.   Sara Hancock was given information about Chronic Care Management services today including:  CCM service includes personalized support from designated clinical staff supervised by her physician, including individualized plan of care and coordination with other care providers 24/7 contact phone numbers for assistance for urgent and routine care needs. Service will only be billed when office clinical staff spend 20 minutes or more in a month to coordinate care. Only one practitioner may furnish and bill the service in a calendar month. The patient may stop CCM services at any time (effective at the end of the month) by phone call to the office staff.   Patient agreed to services and verbal consent obtained.   Follow up plan: PT AWARE DEDUCTIBLE   [place]

## 2021-05-14 ENCOUNTER — Ambulatory Visit (INDEPENDENT_AMBULATORY_CARE_PROVIDER_SITE_OTHER): Payer: Medicare Other | Admitting: Family Medicine

## 2021-05-14 ENCOUNTER — Other Ambulatory Visit: Payer: Self-pay

## 2021-05-14 VITALS — BP 108/64 | HR 84 | Temp 97.5°F | Resp 18 | Wt 158.0 lb

## 2021-05-14 DIAGNOSIS — I509 Heart failure, unspecified: Secondary | ICD-10-CM

## 2021-05-14 DIAGNOSIS — I480 Paroxysmal atrial fibrillation: Secondary | ICD-10-CM | POA: Diagnosis not present

## 2021-05-14 DIAGNOSIS — N184 Chronic kidney disease, stage 4 (severe): Secondary | ICD-10-CM | POA: Diagnosis not present

## 2021-05-14 DIAGNOSIS — E1122 Type 2 diabetes mellitus with diabetic chronic kidney disease: Secondary | ICD-10-CM | POA: Diagnosis not present

## 2021-05-14 NOTE — Progress Notes (Signed)
Subjective:  Patient ID: Sara Hancock, female    DOB: 03/30/1935  Age: 87 y.o. MRN: [id number]  Chief Complaint  Patient presents with   Transitions Of Care   Atrial Fibrillation   Chronic Kidney Disease    HPI Patient is an 87 year old white female who type 2 diabetes, chronic kidney disease stage IV, diastolic congestive heart failure who presents for hospital follow-up.  Patient went into atrial fibrillation with rapid ventricular rhythm and acute congestive heart failure.  proBNP was up to 9000+ on admission.  Patient was admitted to [facility].  She underwent IV diuresis.  Repeat echocardiogram showed good systolic function but abnormal diastolic dysfunction.  Patient had previously been on torsemide 40 mg once daily and nephrologist Dr. Osborne Casco, per the patient, had decreased this to 20 mg daily.  This preceded her congestive heart failure exacerbation.  The patient had gained approximately 15 pounds prior to being admitted.  She has diuresed approximately this amount.  She denies shortness of breath.  Denies chest pain.  Swelling in her legs is at baseline.  Diabetes per patient is well controlled. ECHO I showed atrial fibrillation. Normal EF. Diastolic dysfunction is not commented on.   Current Outpatient Medications on File Prior to Visit  Medication Sig Dispense Refill   ACCU-CHEK AVIVA PLUS test strip USE TO TEST TWICE DAILY 200 strip 3   Accu-Chek FastClix Lancets MISC USE TO CHECK BLOOD SUGAR TWICE DAILY 204 each 3   apixaban (ELIQUIS) 2.5 MG TABS tablet Take 1 tablet (2.5 mg total) by mouth 2 (two) times daily. 180 tablet 1   calcium-vitamin D (OSCAL WITH D) 250-125 MG-UNIT per tablet Take 1 tablet by mouth daily.     Cholecalciferol (VITAMIN D3) 1000 units CAPS Take 1,000 Units by mouth daily.     fexofenadine (ALLEGRA ALLERGY) 60 MG tablet Take 1 tablet (60 mg total) by mouth 2 (two) times daily. 180 tablet 1   fluticasone (FLONASE) 50 MCG/ACT nasal spray Place 1  spray into both nostrils as needed.      folic acid (FOLVITE) 062 MCG tablet Take 400 mcg by mouth daily.     hydrALAZINE (APRESOLINE) 100 MG tablet Take 1 tablet (100 mg total) by mouth 2 (two) times daily. 180 tablet 0   insulin detemir (LEVEMIR FLEXTOUCH) 100 UNIT/ML FlexPen INJECT 38 UNITS UNDER THE SKIN EVERY MORNING AND 22 UNITS WITH SUPPER 45 mL 1   Insulin Pen Needle (BD PEN NEEDLE NANO 2ND GEN) 32G X 4 MM MISC Use a new needle each time when checking FBS 200 each 3   irbesartan (AVAPRO) 150 MG tablet Take 150 mg by mouth daily.     metoprolol tartrate (LOPRESSOR) 100 MG tablet TAKE 1 TABLET(100 MG) BY MOUTH TWICE DAILY 180 tablet 3   Multiple Vitamin (MULTIVITAMIN WITH MINERALS) TABS Take 1 tablet by mouth daily.     nystatin cream (MYCOSTATIN) APPLY SUFFICIENT AMOUNT EXTERNALLY TO THE AFFECTED AREA TWICE DAILY 30 g 1   pantoprazole (PROTONIX) 40 MG tablet TAKE 1 TABLET BY MOUTH EVERY DAY 90 tablet 3   simvastatin (ZOCOR) 40 MG tablet TAKE 1 TABLET BY MOUTH AT BEDTIME 90 tablet 1   spironolactone (ALDACTONE) 25 MG tablet Take 25 mg by mouth daily.     torsemide (DEMADEX) 20 MG tablet TAKE 2 TABLETS BY MOUTH TWICE DAILY (Patient taking differently: 20 mg daily.) 360 tablet 0   ursodiol (ACTIGALL) 300 MG capsule TAKE 1 CAPSULE(300 MG) BY MOUTH TWICE DAILY 180 capsule  0   vitamin C (ASCORBIC ACID) 500 MG tablet Take 500 mg by mouth daily.     vitamin E 400 UNIT capsule Take 400 Units by mouth daily.     No current facility-administered medications on file prior to visit.   Past Medical History:  Diagnosis Date   Acquired thrombophilia ([place]) 09/11/2019   Allergic rhinitis 09/17/2015   Anemia    ANEMIA, NORMOCYTIC 10/27/2007   Qualifier: Diagnosis of  By: Nils Pyle CMA (AAMA), Leisha     Anticoagulant long-term use 01/08/2011   Arthritis    Benign neoplasm of colon 01/08/2011   Biliary cirrhosis ([place])    BRADYCARDIA 11/15/2008   Qualifier: Diagnosis of  By: Hollie Salk CMA, Amanda     Cellulitis  of left foot 09/25/2016   Cholelithiasis 01/09/12   Chronic kidney disease, stage 3 (HCC)    Combined form of age-related cataract, both eyes 02/26/2017   Diabetes mellitus type 2 without retinopathy ([place]) 10/27/2013   Diverticulosis of colon with hemorrhage 06/08/2000   Dyslipidemia 09/17/2015   Encounter for therapeutic drug monitoring 06/09/2013   Esophageal reflux 01/08/2011   Esophageal stenosis 01/08/11   Esophagitis 01/08/11   GALLSTONES 10/26/2008   Qualifier: Diagnosis of  By: Sharlett Iles MD Byrd Hesselbach    Gastritis, erosive chronic 01/08/2011   GERD (gastroesophageal reflux disease)    Glaucoma suspect of both eyes 02/26/2017   Glaucoma suspect of both eyes 02/26/2017   Hiatal hernia 01/08/11   History of IBS 10/27/2007   Qualifier: Diagnosis of  By: Nils Pyle CMA (AAMA), Mearl Latin     HTN (hypertension)    Hx of adenomatous colonic polyps 01/08/11   Hyperplastic colon polyp 06/08/2000, 07/31/2003, 01/08/11   Hypertension, essential 10/27/2007   Qualifier: Diagnosis of  By: Nils Pyle CMA (AAMA), Mearl Latin     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, with stage 1 through stage 4 chronic kidney disease, or unspecified chronic kidney disease    Iron deficiency anemia 01/08/2011   Left foot pain 09/25/2016   Localized edema    Long term (current) use of insulin (HCC)    Mixed hyperlipidemia    Nuclear cataract 07/03/2011   Obesity    Obesity (BMI 30-39.9) 03/22/2012   Osteoarthritis of right thumb 09/17/2015   Osteopenia    Other iron deficiency anemias    Other specified menopausal and perimenopausal disorders    PAF (paroxysmal atrial fibrillation) (HCC)    occurring in the setting of bradycardia and mild first degree AV block followed by Dr. 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   Qualifier: Diagnosis of  By: Nils Pyle CMA (AAMA), Mearl Latin     Pure hypercholesterolemia    Retinal tear, right 07/03/2011   Shortness of breath 12/13/2019   Stage 3b chronic kidney disease ([place]) 09/11/2019   Type II or unspecified type diabetes mellitus without mention of complication, not stated as uncontrolled    Uterine fibroid 01/09/12   Past Surgical History:  Procedure Laterality Date   BREAST CYST EXCISION     left   CARDIOVERSION N/A 11/27/2017   Procedure: CARDIOVERSION;  Surgeon: Sanda Klein, MD;  Location: [facility];  Service: Cardiovascular;  Laterality: N/A;   COLONOSCOPY     INSERTION OF MESH  05/18/2012   Procedure: INSERTION OF MESH;  Surgeon: Adin Hector, MD;  Location: [facility] ORS;  Service: General;  Laterality: N/A;   LIVER BIOPSY  05/18/2012   Procedure: LIVER BIOPSY;  Surgeon: Adin Hector, MD;  Location: [facility] OR[facility];  Service: General;;   POLYPECTOMY     THYROIDECTOMY, [place] HERNIA REPAIR  05/18/2012   Procedure: LAPAROSCOPIC VENTRAL HERNIA;  Surgeon: Adin Hector, MD;  Location: [facility] ORS;  Service: General;  Laterality: N/A;  Laparoscopic Ventral Wall Hernia with Mesh    Family History  Problem Relation Age of Onset   Heart attack Father    Stroke Mother    Diabetes Sister        x 2   Aneurysm Sister    Stroke Sister    Congestive Heart Failure Sister    Diabetes Brother    Kidney failure Other    Hyperlipidemia Other    Arrhythmia Other    Congestive Heart Failure Other    Hypertension Other    Arthritis Other    Colon cancer Neg Hx    Esophageal cancer Neg Hx    Stomach cancer Neg Hx    Rectal cancer Neg Hx    Social History   Socioeconomic History   Marital status: Married    Spouse name: Not on file   Number of children: 2   Years of education: Not on file   Highest education level: Not on file  Occupational History   Occupation: retired    Fish farm manager: RETIRED  Tobacco Use    Smoking status: Never   Smokeless tobacco: Never  Vaping Use   Vaping Use: Never used  Substance and Sexual Activity   Alcohol use: No   Drug use: No   Sexual activity: Not Currently  Other Topics Concern   Not on file  Social History Narrative   Not on file   Social Determinants of Health   Financial Resource Strain: Not on file  Food Insecurity: Not on file  Transportation Needs: Not on file  Physical Activity: Not on file  Stress: Not on file  Social Connections: Not on file    Review of Systems  Constitutional:  Negative for chills, fatigue and fever.  HENT:  Negative for congestion, rhinorrhea and sore throat.   Respiratory:  Negative for cough and shortness of breath.   Cardiovascular:  Positive for leg swelling. Negative for chest pain.  Gastrointestinal:  Negative for abdominal pain, constipation, diarrhea, nausea and vomiting.  Genitourinary:  Negative for dysuria and urgency.  Musculoskeletal:  Positive for back pain. Negative for myalgias.  Neurological:  Negative for dizziness, weakness, light-headedness and headaches.  Psychiatric/Behavioral:  Negative for dysphoric mood. The patient is not nervous/anxious.     Objective:  BP 108/64    Pulse 84    Temp (!) 97.5 F (36.4 C)    Resp 18    Wt 158 lb (71.7 kg)    BMI 28.90 kg/m   BP/Weight 05/14/2021 04/29/2021 94/50/3888  Systolic BP 280 034 917  Diastolic BP 64 80 72  Wt. (Lbs) 158 173 168  BMI 28.9 31.64 30.73    Physical Exam Vitals reviewed.  Constitutional:      Appearance: Normal appearance. She is normal weight.  Neck:     Vascular: No carotid bruit.  Cardiovascular:     Rate and Rhythm: Normal rate. Rhythm irregular.     Heart sounds: Normal heart sounds.  Pulmonary:     Effort:  Pulmonary effort is normal. No respiratory distress.     Breath sounds: Normal breath sounds.  Abdominal:     General: Abdomen is flat. Bowel sounds are normal.     Palpations: Abdomen is soft.     Tenderness:  There is no abdominal tenderness.     Comments: hepatomegaly  Neurological:     Mental Status: She is alert and oriented to person, place, and time.  Psychiatric:        Mood and Affect: Mood normal.        Behavior: Behavior normal.    Diabetic Foot Exam - Simple   No data filed      Lab Results  Component Value Date   WBC 6.6 03/25/2021   HGB 9.4 (L) 03/25/2021   HCT 28.0 (L) 03/25/2021   PLT 281 03/25/2021   GLUCOSE 153 (H) 05/14/2021   CHOL 123 03/25/2021   TRIG 100 03/25/2021   HDL 47 03/25/2021   LDLCALC 57 03/25/2021   ALT 15 05/14/2021   AST 24 05/14/2021   NA 143 05/14/2021   K 4.9 05/14/2021   CL 103 05/14/2021   CREATININE 1.97 (H) 05/14/2021   BUN 49 (H) 05/14/2021   CO2 25 05/14/2021   TSH 2.020 09/10/2020   INR 2.7 08/14/2020   HGBA1C 9.7 (H) 03/25/2021      Assessment & Plan:   Problem List Items Addressed This Visit       Cardiovascular and Mediastinum   PAF (paroxysmal atrial fibrillation) ([place])    The current medical regimen is effective;  continue present plan and medications.       Acute on chronic congestive heart failure (HCC)    Resolved. Continue torsemide at current dose. Pt to follow up with nephrology.          Endocrine   CKD stage 4 due to type 2 diabetes mellitus ([place]) - Primary    Management per Specialist. Patient sees Dr Joylene Grapes.       Relevant Orders   Comprehensive metabolic panel (Completed)  .  No orders of the defined types were placed in this encounter.   Orders Placed This Encounter  Procedures   Comprehensive metabolic panel     Follow-up: Return in about 1 month (around 06/17/2021). An After Visit Summary was printed and given to the patient.  Rochel Brome, MD [facility] [phone]

## 2021-05-15 DIAGNOSIS — N179 Acute kidney failure, unspecified: Secondary | ICD-10-CM | POA: Diagnosis not present

## 2021-05-15 DIAGNOSIS — N1832 Chronic kidney disease, stage 3b: Secondary | ICD-10-CM | POA: Diagnosis not present

## 2021-05-15 DIAGNOSIS — E1122 Type 2 diabetes mellitus with diabetic chronic kidney disease: Secondary | ICD-10-CM | POA: Diagnosis not present

## 2021-05-15 DIAGNOSIS — I130 Hypertensive heart and chronic kidney disease with heart failure and stage 1 through stage 4 chronic kidney disease, or unspecified chronic kidney disease: Secondary | ICD-10-CM | POA: Diagnosis not present

## 2021-05-15 DIAGNOSIS — I5031 Acute diastolic (congestive) heart failure: Secondary | ICD-10-CM | POA: Diagnosis not present

## 2021-05-15 DIAGNOSIS — I4891 Unspecified atrial fibrillation: Secondary | ICD-10-CM | POA: Diagnosis not present

## 2021-05-15 LAB — COMPREHENSIVE METABOLIC PANEL
ALT: 15 IU/L (ref 0–32)
AST: 24 IU/L (ref 0–40)
Albumin/Globulin Ratio: 1.3 (ref 1.2–2.2)
Albumin: 4.2 g/dL (ref 3.6–4.6)
Alkaline Phosphatase: 196 IU/L — ABNORMAL HIGH (ref 44–121)
BUN/Creatinine Ratio: 25 (ref 12–28)
BUN: 49 mg/dL — ABNORMAL HIGH (ref 8–27)
Bilirubin Total: 0.4 mg/dL (ref 0.0–1.2)
CO2: 25 mmol/L (ref 20–29)
Calcium: 9.2 mg/dL (ref 8.7–10.3)
Chloride: 103 mmol/L (ref 96–106)
Creatinine, Ser: 1.97 mg/dL — ABNORMAL HIGH (ref 0.57–1.00)
Globulin, Total: 3.2 g/dL (ref 1.5–4.5)
Glucose: 153 mg/dL — ABNORMAL HIGH (ref 70–99)
Potassium: 4.9 mmol/L (ref 3.5–5.2)
Sodium: 143 mmol/L (ref 134–144)
Total Protein: 7.4 g/dL (ref 6.0–8.5)
eGFR: 25 mL/min/{1.73_m2} — ABNORMAL LOW (ref >59)

## 2021-05-17 DIAGNOSIS — N179 Acute kidney failure, unspecified: Secondary | ICD-10-CM | POA: Diagnosis not present

## 2021-05-17 DIAGNOSIS — I4891 Unspecified atrial fibrillation: Secondary | ICD-10-CM | POA: Diagnosis not present

## 2021-05-17 DIAGNOSIS — N1832 Chronic kidney disease, stage 3b: Secondary | ICD-10-CM | POA: Diagnosis not present

## 2021-05-17 DIAGNOSIS — I5031 Acute diastolic (congestive) heart failure: Secondary | ICD-10-CM | POA: Diagnosis not present

## 2021-05-17 DIAGNOSIS — I130 Hypertensive heart and chronic kidney disease with heart failure and stage 1 through stage 4 chronic kidney disease, or unspecified chronic kidney disease: Secondary | ICD-10-CM | POA: Diagnosis not present

## 2021-05-17 DIAGNOSIS — E1122 Type 2 diabetes mellitus with diabetic chronic kidney disease: Secondary | ICD-10-CM | POA: Diagnosis not present

## 2021-05-21 ENCOUNTER — Other Ambulatory Visit: Payer: Self-pay

## 2021-05-21 ENCOUNTER — Ambulatory Visit
Admission: RE | Admit: 2021-05-21 | Discharge: 2021-05-21 | Disposition: A | Discharge status: Home / Self Care | Payer: Medicare Other | Source: Ambulatory Visit | Attending: Family Medicine | Admitting: Family Medicine

## 2021-05-21 DIAGNOSIS — Z1231 Encounter for screening mammogram for malignant neoplasm of breast: Secondary | ICD-10-CM | POA: Diagnosis not present

## 2021-05-21 NOTE — Assessment & Plan Note (Addendum)
Management per Specialist. Patient sees Dr Joylene Grapes.

## 2021-05-22 DIAGNOSIS — I130 Hypertensive heart and chronic kidney disease with heart failure and stage 1 through stage 4 chronic kidney disease, or unspecified chronic kidney disease: Secondary | ICD-10-CM | POA: Diagnosis not present

## 2021-05-22 DIAGNOSIS — I5031 Acute diastolic (congestive) heart failure: Secondary | ICD-10-CM | POA: Diagnosis not present

## 2021-05-22 DIAGNOSIS — N1832 Chronic kidney disease, stage 3b: Secondary | ICD-10-CM | POA: Diagnosis not present

## 2021-05-22 DIAGNOSIS — I4891 Unspecified atrial fibrillation: Secondary | ICD-10-CM | POA: Diagnosis not present

## 2021-05-22 DIAGNOSIS — N179 Acute kidney failure, unspecified: Secondary | ICD-10-CM | POA: Diagnosis not present

## 2021-05-22 DIAGNOSIS — E1122 Type 2 diabetes mellitus with diabetic chronic kidney disease: Secondary | ICD-10-CM | POA: Diagnosis not present

## 2021-05-24 DIAGNOSIS — N1832 Chronic kidney disease, stage 3b: Secondary | ICD-10-CM | POA: Diagnosis not present

## 2021-05-24 DIAGNOSIS — E1122 Type 2 diabetes mellitus with diabetic chronic kidney disease: Secondary | ICD-10-CM | POA: Diagnosis not present

## 2021-05-24 DIAGNOSIS — I5031 Acute diastolic (congestive) heart failure: Secondary | ICD-10-CM | POA: Diagnosis not present

## 2021-05-24 DIAGNOSIS — I4891 Unspecified atrial fibrillation: Secondary | ICD-10-CM | POA: Diagnosis not present

## 2021-05-24 DIAGNOSIS — N179 Acute kidney failure, unspecified: Secondary | ICD-10-CM | POA: Diagnosis not present

## 2021-05-24 DIAGNOSIS — I130 Hypertensive heart and chronic kidney disease with heart failure and stage 1 through stage 4 chronic kidney disease, or unspecified chronic kidney disease: Secondary | ICD-10-CM | POA: Diagnosis not present

## 2021-05-27 ENCOUNTER — Encounter: Payer: Self-pay | Admitting: Family Medicine

## 2021-05-27 DIAGNOSIS — I509 Heart failure, unspecified: Secondary | ICD-10-CM | POA: Insufficient documentation

## 2021-05-27 NOTE — Assessment & Plan Note (Signed)
The current medical regimen is effective;  continue present plan and medications.  

## 2021-05-27 NOTE — Assessment & Plan Note (Signed)
Resolved. Continue torsemide at current dose. Pt to follow up with nephrology.

## 2021-05-29 DIAGNOSIS — I130 Hypertensive heart and chronic kidney disease with heart failure and stage 1 through stage 4 chronic kidney disease, or unspecified chronic kidney disease: Secondary | ICD-10-CM | POA: Diagnosis not present

## 2021-05-29 DIAGNOSIS — I4891 Unspecified atrial fibrillation: Secondary | ICD-10-CM | POA: Diagnosis not present

## 2021-05-29 DIAGNOSIS — N1832 Chronic kidney disease, stage 3b: Secondary | ICD-10-CM | POA: Diagnosis not present

## 2021-05-29 DIAGNOSIS — E1122 Type 2 diabetes mellitus with diabetic chronic kidney disease: Secondary | ICD-10-CM | POA: Diagnosis not present

## 2021-05-29 DIAGNOSIS — N179 Acute kidney failure, unspecified: Secondary | ICD-10-CM | POA: Diagnosis not present

## 2021-05-29 DIAGNOSIS — I5031 Acute diastolic (congestive) heart failure: Secondary | ICD-10-CM | POA: Diagnosis not present

## 2021-06-04 DIAGNOSIS — E1122 Type 2 diabetes mellitus with diabetic chronic kidney disease: Secondary | ICD-10-CM | POA: Diagnosis not present

## 2021-06-04 DIAGNOSIS — I130 Hypertensive heart and chronic kidney disease with heart failure and stage 1 through stage 4 chronic kidney disease, or unspecified chronic kidney disease: Secondary | ICD-10-CM | POA: Diagnosis not present

## 2021-06-04 DIAGNOSIS — N1832 Chronic kidney disease, stage 3b: Secondary | ICD-10-CM | POA: Diagnosis not present

## 2021-06-04 DIAGNOSIS — N179 Acute kidney failure, unspecified: Secondary | ICD-10-CM | POA: Diagnosis not present

## 2021-06-04 DIAGNOSIS — I5031 Acute diastolic (congestive) heart failure: Secondary | ICD-10-CM | POA: Diagnosis not present

## 2021-06-04 DIAGNOSIS — I4891 Unspecified atrial fibrillation: Secondary | ICD-10-CM | POA: Diagnosis not present

## 2021-06-05 ENCOUNTER — Other Ambulatory Visit: Payer: Self-pay

## 2021-06-05 ENCOUNTER — Ambulatory Visit (INDEPENDENT_AMBULATORY_CARE_PROVIDER_SITE_OTHER): Payer: Medicare Other | Admitting: Cardiology

## 2021-06-05 ENCOUNTER — Encounter: Payer: Self-pay | Admitting: Cardiology

## 2021-06-05 VITALS — BP 120/80 | HR 92 | Ht 62.0 in | Wt 154.2 lb

## 2021-06-05 DIAGNOSIS — I480 Paroxysmal atrial fibrillation: Secondary | ICD-10-CM

## 2021-06-05 DIAGNOSIS — I38 Endocarditis, valve unspecified: Secondary | ICD-10-CM | POA: Diagnosis not present

## 2021-06-05 DIAGNOSIS — E119 Type 2 diabetes mellitus without complications: Secondary | ICD-10-CM

## 2021-06-05 DIAGNOSIS — I5030 Unspecified diastolic (congestive) heart failure: Secondary | ICD-10-CM | POA: Diagnosis not present

## 2021-06-05 DIAGNOSIS — E782 Mixed hyperlipidemia: Secondary | ICD-10-CM

## 2021-06-05 DIAGNOSIS — Z79899 Other long term (current) drug therapy: Secondary | ICD-10-CM

## 2021-06-05 DIAGNOSIS — I131 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, with stage 1 through stage 4 chronic kidney disease, or unspecified chronic kidney disease: Secondary | ICD-10-CM

## 2021-06-05 DIAGNOSIS — Z7901 Long term (current) use of anticoagulants: Secondary | ICD-10-CM

## 2021-06-05 LAB — BASIC METABOLIC PANEL
BUN/Creatinine Ratio: 34 — ABNORMAL HIGH (ref 12–28)
BUN: 71 mg/dL — ABNORMAL HIGH (ref 8–27)
CO2: 26 mmol/L (ref 20–29)
Calcium: 9.4 mg/dL (ref 8.7–10.3)
Chloride: 98 mmol/L (ref 96–106)
Creatinine, Ser: 2.09 mg/dL — ABNORMAL HIGH (ref 0.57–1.00)
Glucose: 122 mg/dL — ABNORMAL HIGH (ref 70–99)
Potassium: 4.6 mmol/L (ref 3.5–5.2)
Sodium: 137 mmol/L (ref 134–144)
eGFR: 23 mL/min/{1.73_m2} — ABNORMAL LOW (ref >59)

## 2021-06-05 LAB — MAGNESIUM: Magnesium: 2.1 mg/dL (ref 1.6–2.3)

## 2021-06-05 MED ORDER — METOLAZONE 5 MG PO TABS: 5.0000 mg | ORAL_TABLET | ORAL | 6 refills | Status: DC

## 2021-06-05 NOTE — Progress Notes (Signed)
Cardiology Office Note:    Date:  06/05/2021   ID:  TIYE HUWE, DOB 1934/10/02, MRN [id number]  PCP:  Rochel Brome, MD  Cardiologist:  Berniece Salines, DO  Electrophysiologist:  None   Referring MD: Rochel Brome, MD     History of Present Illness:    Sara Hancock is a 87 y.o. female with a hx of  atrial fibrillation on Coumadin and metoprolol, diastolic heart failure, hypertension presents today for follow-up visit.     I saw the patient on March 26, 2020 at that time we kept her on her for some mild twice daily.  But in the interim I have given the patient some metolazone due to bilateral leg edema.  She is here today for follow-up visit.   I saw the patient on June 26, 2020 at that time she appeared to be doing well from a cardiovascular standpoint.  She was at her baseline.  We did blood work with electrolytes.  And she continue to follow with our Coumadin clinic.   At her visit on Sep 25, 2020 the patient had transitioned to Eliquis off Coumadin.  She seems to be happy with that decision.  At that visit she was doing well from a cardiovascular standpoint.  No medication changes were made.  I saw the patient on November 30,2022 at that time she had been hospitalized in the [place] her diuretics was also decreased by her nephrologist.  During her visit she did have significant leg edema I started the patient on metolazone for several days and then defer to her nephrologist for any further diuretic optimization given her chronic kidney disease.  Since her last visit we have had blood work as well as try to optimize her diuretics.  She is here today for follow-up visit.  Since I last saw the patient she has been hospitalized for congestive heart failure.  She was diuresed she is here today for follow-up visit.  She is doing well.  No chest pain.  Baseline shortness of breath with no change.   Past Medical History:  Diagnosis Date   Acquired thrombophilia ([place]) 09/11/2019    Allergic rhinitis 09/17/2015   Anemia    ANEMIA, NORMOCYTIC 10/27/2007   Qualifier: Diagnosis of  By: Nils Pyle CMA (AAMA), Leisha     Anticoagulant long-term use 01/08/2011   Arthritis    Benign neoplasm of colon 01/08/2011   Biliary cirrhosis ([place])    BRADYCARDIA 11/15/2008   Qualifier: Diagnosis of  By: Hollie Salk CMA, Amanda     Cellulitis of left foot 09/25/2016   Cholelithiasis 01/09/12   Chronic kidney disease, stage 3 ([place])    Combined form of age-related cataract, both eyes 02/26/2017   Diabetes mellitus type 2 without retinopathy ([place]) 10/27/2013   Diverticulosis of colon with hemorrhage 06/08/2000   Dyslipidemia 09/17/2015   Encounter for therapeutic drug monitoring 06/09/2013   Esophageal reflux 01/08/2011   Esophageal stenosis 01/08/11   Esophagitis 01/08/11   GALLSTONES 10/26/2008   Qualifier: Diagnosis of  By: Sharlett Iles MD Byrd Hesselbach    Gastritis, erosive chronic 01/08/2011   GERD (gastroesophageal reflux disease)    Glaucoma suspect of both eyes 02/26/2017   Glaucoma suspect of both eyes 02/26/2017   Hiatal hernia 01/08/11   History of IBS 10/27/2007   Qualifier: Diagnosis of  By: Nils Pyle CMA (AAMA), Leisha     HTN (hypertension)    Hx of adenomatous colonic polyps 01/08/11   Hyperplastic colon polyp 06/08/2000, 07/31/2003, 01/08/11  Hypertension, essential 10/27/2007   Qualifier: Diagnosis of  By: Nils Pyle CMA Deborra Medina), Mearl Latin     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, with stage 1 through stage 4 chronic kidney disease, or unspecified chronic kidney disease    Iron deficiency anemia 01/08/2011   Left foot pain 09/25/2016   Localized edema    Long term (current) use of insulin (HCC)    Mixed hyperlipidemia    Nuclear cataract 07/03/2011   Obesity    Obesity (BMI 30-39.9) 03/22/2012   Osteoarthritis of right thumb 09/17/2015   Osteopenia    Other iron deficiency anemias    Other specified menopausal and perimenopausal disorders    PAF (paroxysmal atrial  fibrillation) (HCC)    occurring in the setting of bradycardia and mild first degree AV block followed by Dr. 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   Qualifier: Diagnosis of  By: Nils Pyle CMA (AAMA), Mearl Latin     Pure hypercholesterolemia    Retinal tear, right 07/03/2011   Shortness of breath 12/13/2019   Stage 3b chronic kidney disease ([place]) 09/11/2019   Type II or unspecified type diabetes mellitus without mention of complication, not stated as uncontrolled    Uterine fibroid 01/09/12    Past Surgical History:  Procedure Laterality Date   BREAST CYST EXCISION     left   CARDIOVERSION N/A 11/27/2017   Procedure: CARDIOVERSION;  Surgeon: Sanda Klein, MD;  Location: [facility];  Service: Cardiovascular;  Laterality: N/A;   COLONOSCOPY     INSERTION OF MESH  05/18/2012   Procedure: INSERTION OF MESH;  Surgeon: Adin Hector, MD;  Location: [facility];  Service: General;  Laterality: N/A;   LIVER BIOPSY  05/18/2012   Procedure: LIVER BIOPSY;  Surgeon: Adin Hector, MD;  Location: [facility];  Service: General;;   POLYPECTOMY     THYROIDECTOMY, [place]  05/18/2012   Procedure: LAPAROSCOPIC VENTRAL HERNIA;  Surgeon: Adin Hector, MD;  Location: [facility];  Service: General;  Laterality: N/A;  Laparoscopic Ventral Wall Hernia with Mesh    Current Medications: Current Meds  Medication Sig   ACCU-CHEK AVIVA PLUS test strip USE TO TEST TWICE DAILY   Accu-Chek FastClix Lancets MISC USE TO CHECK BLOOD SUGAR TWICE DAILY   apixaban (ELIQUIS) 2.5 MG TABS tablet Take 1 tablet (2.5 mg total) by mouth 2 (two) times daily.   calcium-vitamin D (OSCAL WITH D) 250-125 MG-UNIT per tablet Take 1 tablet by mouth daily.   Cholecalciferol  (VITAMIN D3) 1000 units CAPS Take 1,000 Units by mouth daily.   fexofenadine (ALLEGRA ALLERGY) 60 MG tablet Take 1 tablet (60 mg total) by mouth 2 (two) times daily.   fluticasone (FLONASE) 50 MCG/ACT nasal spray Place 1 spray into both nostrils as needed.    folic acid (FOLVITE) 572 MCG tablet Take 400 mcg by mouth daily.   hydrALAZINE (APRESOLINE) 100 MG tablet Take 1 tablet (100 mg total) by mouth 2 (two) times daily.   insulin detemir (LEVEMIR FLEXTOUCH) 100 UNIT/ML FlexPen INJECT 38 UNITS UNDER THE SKIN EVERY MORNING AND 22 UNITS WITH SUPPER   Insulin Pen Needle (BD PEN NEEDLE NANO 2ND GEN) 32G X 4 MM MISC Use a new  needle each time when checking FBS   irbesartan (AVAPRO) 150 MG tablet Take 150 mg by mouth daily.   metolazone (ZAROXOLYN) 5 MG tablet Take 1 tablet (5 mg total) by mouth once a week.   metoprolol tartrate (LOPRESSOR) 100 MG tablet TAKE 1 TABLET(100 MG) BY MOUTH TWICE DAILY   Multiple Vitamin (MULTIVITAMIN WITH MINERALS) TABS Take 1 tablet by mouth daily.   nystatin cream (MYCOSTATIN) APPLY SUFFICIENT AMOUNT EXTERNALLY TO THE AFFECTED AREA TWICE DAILY   pantoprazole (PROTONIX) 40 MG tablet TAKE 1 TABLET BY MOUTH EVERY DAY   simvastatin (ZOCOR) 40 MG tablet TAKE 1 TABLET BY MOUTH AT BEDTIME   spironolactone (ALDACTONE) 25 MG tablet Take 25 mg by mouth daily.   torsemide (DEMADEX) 20 MG tablet TAKE 2 TABLETS BY MOUTH TWICE DAILY (Patient taking differently: 20 mg daily.)   ursodiol (ACTIGALL) 300 MG capsule TAKE 1 CAPSULE(300 MG) BY MOUTH TWICE DAILY   vitamin C (ASCORBIC ACID) 500 MG tablet Take 500 mg by mouth daily.   vitamin E 400 UNIT capsule Take 400 Units by mouth daily.     Allergies:   Ace inhibitors, Capoten [captopril], and Neomycin-bacitracin zn-polymyx   Social History   Socioeconomic History   Marital status: Married    Spouse name: Not on file   Number of children: 2   Years of education: Not on file   Highest education level: Not on file  Occupational  History   Occupation: retired    Fish farm manager: RETIRED  Tobacco Use   Smoking status: Never   Smokeless tobacco: Never  Vaping Use   Vaping Use: Never used  Substance and Sexual Activity   Alcohol use: No   Drug use: No   Sexual activity: Not Currently  Other Topics Concern   Not on file  Social History Narrative   Not on file   Social Determinants of Health   Financial Resource Strain: Not on file  Food Insecurity: Not on file  Transportation Needs: Not on file  Physical Activity: Not on file  Stress: Not on file  Social Connections: Not on file     Family History: The patient's family history includes Aneurysm in her sister; Arrhythmia in an other family member; Arthritis in an other family member; Congestive Heart Failure in her sister and another family member; Diabetes in her brother and sister; Heart attack in her father; Hyperlipidemia in an other family member; Hypertension in an other family member; Kidney failure in an other family member; Stroke in her mother and sister. There is no history of Colon cancer, Esophageal cancer, Stomach cancer, or Rectal cancer.  ROS:   Review of Systems  Constitution: Negative for decreased appetite, fever and weight gain.  HENT: Negative for congestion, ear discharge, hoarse voice and sore throat.   Eyes: Negative for discharge, redness, vision loss in right eye and visual halos.  Cardiovascular: Negative for chest pain, dyspnea on exertion, leg swelling, orthopnea and palpitations.  Respiratory: Negative for cough, hemoptysis, shortness of breath and snoring.   Endocrine: Negative for heat intolerance and polyphagia.  Hematologic/Lymphatic: Negative for bleeding problem. Does not bruise/bleed easily.  Skin: Negative for flushing, nail changes, rash and suspicious lesions.  Musculoskeletal: Negative for arthritis, joint pain, muscle cramps, myalgias, neck pain and stiffness.  Gastrointestinal: Negative for abdominal pain, bowel  incontinence, diarrhea and excessive appetite.  Genitourinary: Negative for decreased libido, genital sores and incomplete emptying.  Neurological: Negative for brief paralysis, focal weakness, headaches and loss of balance.  Psychiatric/Behavioral: Negative  for altered mental status, depression and suicidal ideas.  Allergic/Immunologic: Negative for HIV exposure and persistent infections.    EKGs/Labs/Other Studies Reviewed:    The following studies were reviewed today:   EKG: None today  Recent Labs: 09/10/2020: TSH 2.020 03/25/2021: Hemoglobin 9.4; Platelets 281 04/18/2021: Magnesium 2.5 05/14/2021: ALT 15; BUN 49; Creatinine, Ser 1.97; Potassium 4.9; Sodium 143  Recent Lipid Panel    Component Value Date/Time   CHOL 123 03/25/2021 1409   TRIG 100 03/25/2021 1409   HDL 47 03/25/2021 1409   CHOLHDL 2.6 03/25/2021 1409   LDLCALC 57 03/25/2021 1409    Physical Exam:    VS:  BP 120/80 (BP Location: Left Arm)    Pulse 92    Ht 5\' 2"  (1.575 m)    Wt 154 lb 3.2 oz (69.9 kg)    BMI 28.20 kg/m     Wt Readings from Last 3 Encounters:  06/05/21 154 lb 3.2 oz (69.9 kg)  05/14/21 158 lb (71.7 kg)  04/29/21 173 lb (78.5 kg)     GEN: Well nourished, well developed in no acute distress HEENT: Normal NECK: No JVD; No carotid bruits LYMPHATICS: No lymphadenopathy CARDIAC: S1S2 noted,RRR, no murmurs, rubs, gallops RESPIRATORY:  Clear to auscultation without rales, wheezing or rhonchi  ABDOMEN: Soft, non-tender, non-distended, +bowel sounds, no guarding. EXTREMITIES: No edema, No cyanosis, no clubbing MUSCULOSKELETAL:  No deformity  SKIN: Warm and dry NEUROLOGIC:  Alert and oriented x 3, non-focal PSYCHIATRIC:  Normal affect, good insight  ASSESSMENT:    1. Diastolic heart failure due to valvular disease, unspecified heart failure chronicity ([place])   2. Medication management   3. 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, with stage 1 through stage 4  chronic kidney disease, or unspecified chronic kidney disease   4. PAF (paroxysmal atrial fibrillation) ([place])   5. Diabetes mellitus type 2 without retinopathy ([place])   6. Anticoagulant long-term use   7. Mixed hyperlipidemia    PLAN:     Clinically she appears to be euvolemic.  She is now taking the torsemide 20 mg daily.  We will keep her on that.  In the meantime we will give the patient metolazone once weekly hoping this will be okay with my nephrology colleagues.  We will get blood work today to assess kidney function.  I also have discussed with the patient on how to use this medication.  She expresses understanding.  Permanent atrial fibrillation-continue current medication regimen.  Chronic and states she is continue to monitor creatinine.  Blood pressure is acceptable, continue with current antihypertensive regimen.    The patient is in agreement with the above plan. The patient left the office in stable condition.  The patient will follow up in 6 months or sooner if needed.   Medication Adjustments/Labs and Tests Ordered: Current medicines are reviewed at length with the patient today.  Concerns regarding medicines are outlined above.  Orders Placed This Encounter  Procedures   Basic Metabolic Panel (BMET)   Magnesium   Meds ordered this encounter  Medications   metolazone (ZAROXOLYN) 5 MG tablet    Sig: Take 1 tablet (5 mg total) by mouth once a week.    Dispense:  4 tablet    Refill:  6    Patient Instructions  Medication Instructions:  Your physician has recommended you make the following change in your medication:  START: Metolazone 5 mg once weekly *If you need a refill on your cardiac medications before your  next appointment, please call your pharmacy*   Lab Work: Your physician recommends that you return for lab work in:  TODAY: BMET, [place] If you have labs (blood work) drawn today and your tests are completely normal, you will receive your results only  by: MyChart Message (if you have [place]) OR A paper copy in the mail If you have any lab test that is abnormal or we need to change your treatment, we will call you to review the results.   Testing/Procedures: None   Follow-Up: At [facility], you and your health needs are our priority.  As part of our continuing mission to provide you with exceptional heart care, we have created designated Provider Care Teams.  These Care Teams include your primary Cardiologist (physician) and Advanced Practice Providers (APPs -  Physician Assistants and Nurse Practitioners) who all work together to provide you with the care you need, when you need it.  We recommend signing up for the patient portal called "MyChart".  Sign up information is provided on this After Visit Summary.  MyChart is used to connect with patients for Virtual Visits (Telemedicine).  Patients are able to view lab/test results, encounter notes, upcoming appointments, etc.  Non-urgent messages can be sent to your provider as well.   To learn more about what you can do with MyChart, go to [email or web address].    Your next appointment:   6 month(s)  The format for your next appointment:   In Person  Provider:   Berniece Salines, DO     Other Instructions     Adopting a Healthy Lifestyle.  Know what a healthy weight is for you (roughly BMI <25) and aim to maintain this   Aim for 7+ servings of fruits and vegetables daily   65-80+ fluid ounces of water or unsweet tea for healthy kidneys   Limit to max 1 drink of alcohol per day; avoid smoking/tobacco   Limit animal fats in diet for cholesterol and heart health - choose grass fed whenever available   Avoid highly processed foods, and foods high in saturated/trans fats   Aim for low stress - take time to unwind and care for your mental health   Aim for 150 min of moderate intensity exercise weekly for heart health, and weights twice weekly for bone health   Aim for 7-9  hours of sleep daily   When it comes to diets, agreement about the perfect plan isnt easy to find, even among the experts. Experts at the [employer] developed an idea known as the Healthy Eating Plate. Just imagine a plate divided into logical, healthy portions.   The emphasis is on diet quality:   Load up on vegetables and fruits - one-half of your plate: Aim for color and variety, and remember that potatoes dont count.   Go for whole grains - one-quarter of your plate: Whole wheat, barley, wheat berries, quinoa, oats, brown rice, and foods made with them. If you want pasta, go with whole wheat pasta.   Protein power - one-quarter of your plate: Fish, chicken, beans, and nuts are all healthy, versatile protein sources. Limit red meat.   The diet, however, does go beyond the plate, offering a few other suggestions.   Use healthy plant oils, such as olive, canola, soy, corn, sunflower and peanut. Check the labels, and avoid partially hydrogenated oil, which have unhealthy trans fats.   If youre thirsty, drink water. Coffee and tea are good in moderation, but skip  sugary drinks and limit milk and dairy products to one or two daily servings.   The type of carbohydrate in the diet is more important than the amount. Some sources of carbohydrates, such as vegetables, fruits, whole grains, and beans-are healthier than others.   Finally, stay active  Signed, Berniece Salines, DO  06/05/2021 10:43 AM    [place]

## 2021-06-05 NOTE — Patient Instructions (Signed)
Medication Instructions:  Your physician has recommended you make the following change in your medication:  START: Metolazone 5 mg once weekly *If you need a refill on your cardiac medications before your next appointment, please call your pharmacy*   Lab Work: Your physician recommends that you return for lab work in:  TODAY: BMET, [place] If you have labs (blood work) drawn today and your tests are completely normal, you will receive your results only by: [place] (if you have [place]) OR A paper copy in the mail If you have any lab test that is abnormal or we need to change your treatment, we will call you to review the results.   Testing/Procedures: None   Follow-Up: At [facility], you and your health needs are our priority.  As part of our continuing mission to provide you with exceptional heart care, we have created designated Provider Care Teams.  These Care Teams include your primary Cardiologist (physician) and Advanced Practice Providers (APPs -  Physician Assistants and Nurse Practitioners) who all work together to provide you with the care you need, when you need it.  We recommend signing up for the patient portal called "MyChart".  Sign up information is provided on this After Visit Summary.  MyChart is used to connect with patients for Virtual Visits (Telemedicine).  Patients are able to view lab/test results, encounter notes, upcoming appointments, etc.  Non-urgent messages can be sent to your provider as well.   To learn more about what you can do with MyChart, go to [email or web address].    Your next appointment:   6 month(s)  The format for your next appointment:   In Person  Provider:   Berniece Salines, DO     Other Instructions

## 2021-06-07 DIAGNOSIS — Z7901 Long term (current) use of anticoagulants: Secondary | ICD-10-CM | POA: Diagnosis not present

## 2021-06-07 DIAGNOSIS — I5031 Acute diastolic (congestive) heart failure: Secondary | ICD-10-CM | POA: Diagnosis not present

## 2021-06-07 DIAGNOSIS — K219 Gastro-esophageal reflux disease without esophagitis: Secondary | ICD-10-CM | POA: Diagnosis not present

## 2021-06-07 DIAGNOSIS — U071 COVID-19: Secondary | ICD-10-CM | POA: Diagnosis not present

## 2021-06-07 DIAGNOSIS — I4891 Unspecified atrial fibrillation: Secondary | ICD-10-CM | POA: Diagnosis not present

## 2021-06-07 DIAGNOSIS — I272 Pulmonary hypertension, unspecified: Secondary | ICD-10-CM | POA: Diagnosis not present

## 2021-06-07 DIAGNOSIS — E669 Obesity, unspecified: Secondary | ICD-10-CM | POA: Diagnosis not present

## 2021-06-07 DIAGNOSIS — N1832 Chronic kidney disease, stage 3b: Secondary | ICD-10-CM | POA: Diagnosis not present

## 2021-06-07 DIAGNOSIS — Z6832 Body mass index (BMI) 32.0-32.9, adult: Secondary | ICD-10-CM | POA: Diagnosis not present

## 2021-06-07 DIAGNOSIS — K8020 Calculus of gallbladder without cholecystitis without obstruction: Secondary | ICD-10-CM | POA: Diagnosis not present

## 2021-06-07 DIAGNOSIS — N179 Acute kidney failure, unspecified: Secondary | ICD-10-CM | POA: Diagnosis not present

## 2021-06-07 DIAGNOSIS — Z602 Problems related to living alone: Secondary | ICD-10-CM | POA: Diagnosis not present

## 2021-06-07 DIAGNOSIS — E1122 Type 2 diabetes mellitus with diabetic chronic kidney disease: Secondary | ICD-10-CM | POA: Diagnosis not present

## 2021-06-07 DIAGNOSIS — Z794 Long term (current) use of insulin: Secondary | ICD-10-CM | POA: Diagnosis not present

## 2021-06-07 DIAGNOSIS — I130 Hypertensive heart and chronic kidney disease with heart failure and stage 1 through stage 4 chronic kidney disease, or unspecified chronic kidney disease: Secondary | ICD-10-CM | POA: Diagnosis not present

## 2021-06-10 ENCOUNTER — Other Ambulatory Visit: Payer: Self-pay | Admitting: Family Medicine

## 2021-06-10 DIAGNOSIS — D631 Anemia in chronic kidney disease: Secondary | ICD-10-CM

## 2021-06-10 DIAGNOSIS — N184 Chronic kidney disease, stage 4 (severe): Secondary | ICD-10-CM | POA: Diagnosis not present

## 2021-06-11 LAB — COMPREHENSIVE METABOLIC PANEL
ALT: 24 IU/L (ref 0–32)
AST: 33 IU/L (ref 0–40)
Albumin/Globulin Ratio: 1.2 (ref 1.2–2.2)
Albumin: 3.9 g/dL (ref 3.6–4.6)
Alkaline Phosphatase: 183 IU/L — ABNORMAL HIGH (ref 44–121)
BUN/Creatinine Ratio: 32 — ABNORMAL HIGH (ref 12–28)
BUN: 77 mg/dL (ref 8–27)
Bilirubin Total: 0.4 mg/dL (ref 0.0–1.2)
CO2: 23 mmol/L (ref 20–29)
Calcium: 9.4 mg/dL (ref 8.7–10.3)
Chloride: 97 mmol/L (ref 96–106)
Creatinine, Ser: 2.4 mg/dL — ABNORMAL HIGH (ref 0.57–1.00)
Globulin, Total: 3.3 g/dL (ref 1.5–4.5)
Glucose: 277 mg/dL — ABNORMAL HIGH (ref 70–99)
Potassium: 5.2 mmol/L (ref 3.5–5.2)
Sodium: 137 mmol/L (ref 134–144)
Total Protein: 7.2 g/dL (ref 6.0–8.5)
eGFR: 19 mL/min/{1.73_m2} — ABNORMAL LOW (ref >59)

## 2021-06-11 LAB — CBC WITH DIFFERENTIAL/PLATELET
Basophils Absolute: 0 10*3/uL (ref 0.0–0.2)
Basos: 0 %
EOS (ABSOLUTE): 0 10*3/uL (ref 0.0–0.4)
Eos: 0 %
Hematocrit: 32.1 % — ABNORMAL LOW (ref 34.0–46.6)
Hemoglobin: 10.6 g/dL — ABNORMAL LOW (ref 11.1–15.9)
Immature Grans (Abs): 0 10*3/uL (ref 0.0–0.1)
Immature Granulocytes: 0 %
Lymphocytes Absolute: 1.6 10*3/uL (ref 0.7–3.1)
Lymphs: 20 %
MCH: 31.7 pg (ref 26.6–33.0)
MCHC: 33 g/dL (ref 31.5–35.7)
MCV: 96 fL (ref 79–97)
Monocytes Absolute: 0.6 10*3/uL (ref 0.1–0.9)
Monocytes: 8 %
Neutrophils Absolute: 5.7 10*3/uL (ref 1.4–7.0)
Neutrophils: 72 %
Platelets: 248 10*3/uL (ref 150–450)
RBC: 3.34 x10E6/uL — ABNORMAL LOW (ref 3.77–5.28)
RDW: 13.9 % (ref 11.7–15.4)
WBC: 7.9 10*3/uL (ref 3.4–10.8)

## 2021-06-11 LAB — IRON,TIBC AND FERRITIN PANEL
Ferritin: 193 ng/mL — ABNORMAL HIGH (ref 15–150)
Iron Saturation: 27 % (ref 15–55)
Iron: 80 ug/dL (ref 27–139)
Total Iron Binding Capacity: 291 ug/dL (ref 250–450)
UIBC: 211 ug/dL (ref 118–369)

## 2021-06-12 ENCOUNTER — Telehealth: Payer: Self-pay

## 2021-06-12 NOTE — Chronic Care Management (AMB) (Signed)
Chronic Care Management Pharmacy Assistant   Name: Sara Hancock  MRN: [id number] DOB: 1934/11/21   Reason for Encounter: Chart Prep for initial visit with CPP    Recent office visits:  05/14/21 Rochel Brome MD. 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 D/C Metolazone 5 mg.   04/29/21 Reinaldo Meeker MD. Seen for PAF. No med changes.   04/24/21 Reinaldo Meeker MD. Seen for Stasis Edema. No med changes.  04/11/21 Meredith Mody RN. Seen for Osteoporosis. Injected Prolia 60mg  in office. No med changes.   04/01/21 Rochel Brome MD. Orders Only. Levemir. Increase to 35 U in am and 20 U in pm. Discontinue farxiga and januvia due to Stage 4 CKD.  03/28/21 Rochel Brome MD. Seen for Weakness of hips. Decreased Irbesartan from 300 mg to 150mg  daily. D/C Janumet HCI 50-1000mg .  12/20/20 Reinaldo Meeker MD. Seen for DM 2. No med changes.   Recent consult visits:  06/05/21 (Cardiology) Berniece Salines DO. 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 Started on Metolazone 5 mg weekly.  04/15/21 (Cardiology) Telephone Encounter. Tobb, Kardie DO. Please have her take the torsemide 20 mg for the next 3 days, then give her metolazone 5 mg for tomorrow to take 30 minutes before her torsemide dose  04/10/21 (Cardiology) Berniece Salines DO. 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   Hospital visits:  Medication Reconciliation was completed by comparing discharge summary, patients EMR and Pharmacy list, and upon discussion with patient.  Admitted to the hospital on 02/11/21 due to Acute Renal failure.Marland Kitchen Discharge date was 02/15/21. Discharged from [place]?Medications Started at [facility] Discharge:?? -started Furosemide 40mg  , Eliquis 2.5mg , Flonase nasal spray, Lantus 100units and Lidocaine 5% patch  Medications Discontinued at Hospital Discharge: -Stopped Torsemide 20mg , Irbesartan 300mg , Levemir Flextouch  100units, Spironolactone 25mg   -All other medications will remain the same.    Medications: Outpatient Encounter Medications as of 06/12/2021  Medication Sig   ACCU-CHEK AVIVA PLUS test strip USE TO TEST TWICE DAILY   Accu-Chek FastClix Lancets MISC USE TO CHECK BLOOD SUGAR TWICE DAILY   apixaban (ELIQUIS) 2.5 MG TABS tablet Take 1 tablet (2.5 mg total) by mouth 2 (two) times daily.   calcium-vitamin D (OSCAL WITH D) 250-125 MG-UNIT per tablet Take 1 tablet by mouth daily.   Cholecalciferol (VITAMIN D3) 1000 units CAPS Take 1,000 Units by mouth daily.   fexofenadine (ALLEGRA ALLERGY) 60 MG tablet Take 1 tablet (60 mg total) by mouth 2 (two) times daily.   fluticasone (FLONASE) 50 MCG/ACT nasal spray Place 1 spray into both nostrils as needed.    folic acid (FOLVITE) 409 MCG tablet Take 400 mcg by mouth daily.   hydrALAZINE (APRESOLINE) 100 MG tablet Take 1 tablet (100 mg total) by mouth 2 (two) times daily.   insulin detemir (LEVEMIR FLEXTOUCH) 100 UNIT/ML FlexPen INJECT 38 UNITS UNDER THE SKIN EVERY MORNING AND 22 UNITS WITH SUPPER   Insulin Pen Needle (BD PEN NEEDLE NANO 2ND GEN) 32G X 4 MM MISC Use a new needle each time when checking FBS   irbesartan (AVAPRO) 150 MG tablet Take 150 mg by mouth daily.   metolazone (ZAROXOLYN) 5 MG tablet Take 1 tablet (5 mg total) by mouth once a week.   metoprolol tartrate (LOPRESSOR) 100 MG tablet TAKE 1 TABLET(100 MG) BY MOUTH TWICE DAILY   Multiple Vitamin (MULTIVITAMIN WITH MINERALS) TABS Take 1 tablet  by mouth daily.   nystatin cream (MYCOSTATIN) APPLY SUFFICIENT AMOUNT EXTERNALLY TO THE AFFECTED AREA TWICE DAILY   pantoprazole (PROTONIX) 40 MG tablet TAKE 1 TABLET BY MOUTH EVERY DAY   simvastatin (ZOCOR) 40 MG tablet TAKE 1 TABLET BY MOUTH AT BEDTIME   spironolactone (ALDACTONE) 25 MG tablet Take 25 mg by mouth daily.   torsemide (DEMADEX) 20 MG tablet TAKE 2 TABLETS BY MOUTH TWICE DAILY (Patient taking differently: 20 mg daily.)   ursodiol  (ACTIGALL) 300 MG capsule TAKE 1 CAPSULE(300 MG) BY MOUTH TWICE DAILY   vitamin C (ASCORBIC ACID) 500 MG tablet Take 500 mg by mouth daily.   vitamin E 400 UNIT capsule Take 400 Units by mouth daily.   No facility-administered encounter medications on file as of 06/12/2021.    Lab Results  Component Value Date/Time   HGBA1C 9.7 (H) 03/25/2021 02:09 PM   HGBA1C 6.6 (H) 12/17/2020 03:57 PM     BP Readings from Last 3 Encounters:  06/05/21 120/80  05/14/21 108/64  04/29/21 120/80    Patient Questions:   Have you seen any other providers since your last visit with PCP? Yes, she sees her Cardiologist, Dr. Harriet Masson.   Any changes in your medications or health? Pt denies any changes   Any side effects from any medications? Pt denies any side effects   Do you have an symptoms or problems not managed by your medications? No  Any concerns about your health right now? No, pt has not concerns at this moment   Has your provider asked that you check blood pressure, blood sugar, or follow special diet at home? Yes, she checks her Blood pressure and Blood sugar everyday   Do you get any type of exercise on a regular basis? Yes, pt does stretch exercises every morning   Can you think of a goal you would like to reach for your health? Yes, pt wants to be as healthy as she can be.  Do you have any problems getting your medications? Pt has no issues   Is there anything that you would like to discuss during the appointment? Nothing else to add at this time   DAYA DUTT was reminded to have all medications, supplements and any blood glucose and blood pressure readings available for review with Arizona Constable, Pharm. D, at her telephone visit on 06/17/21 at 2:00pm .    Star Rating Drugs:  Medication:  Last Fill: Day Supply Irbesartan   03/21/21 90ds    09/13/20  90ds Simvastatin   04/19/21 90ds    09/13/20  90ds  Care Gaps: Last annual wellness visit? None noted  Mammogram: 05/21/21 Dexa  Scan: 08/01/19 Last eye exam / retinopathy screening? 04/26/20 Last diabetic foot exam? 09/12/20  Elray Mcgregor, [place] Pharmacist Assistant  [phone]

## 2021-06-14 DIAGNOSIS — I130 Hypertensive heart and chronic kidney disease with heart failure and stage 1 through stage 4 chronic kidney disease, or unspecified chronic kidney disease: Secondary | ICD-10-CM | POA: Diagnosis not present

## 2021-06-14 DIAGNOSIS — I4891 Unspecified atrial fibrillation: Secondary | ICD-10-CM | POA: Diagnosis not present

## 2021-06-14 DIAGNOSIS — I5031 Acute diastolic (congestive) heart failure: Secondary | ICD-10-CM | POA: Diagnosis not present

## 2021-06-14 DIAGNOSIS — E1122 Type 2 diabetes mellitus with diabetic chronic kidney disease: Secondary | ICD-10-CM | POA: Diagnosis not present

## 2021-06-14 DIAGNOSIS — N179 Acute kidney failure, unspecified: Secondary | ICD-10-CM | POA: Diagnosis not present

## 2021-06-14 DIAGNOSIS — N1832 Chronic kidney disease, stage 3b: Secondary | ICD-10-CM | POA: Diagnosis not present

## 2021-06-17 ENCOUNTER — Other Ambulatory Visit: Payer: Self-pay

## 2021-06-17 ENCOUNTER — Ambulatory Visit (INDEPENDENT_AMBULATORY_CARE_PROVIDER_SITE_OTHER): Payer: Medicare Other

## 2021-06-17 DIAGNOSIS — I5031 Acute diastolic (congestive) heart failure: Secondary | ICD-10-CM

## 2021-06-17 DIAGNOSIS — I480 Paroxysmal atrial fibrillation: Secondary | ICD-10-CM

## 2021-06-17 DIAGNOSIS — E782 Mixed hyperlipidemia: Secondary | ICD-10-CM

## 2021-06-17 NOTE — Progress Notes (Signed)
Chronic Care Management Pharmacy Note  06/17/2021 Name:  Sara Hancock MRN:  [id number] DOB:  1934/05/23  Summary: -Pleasant 87 year old female presents for initial CCM visit. Has 2 children, 3 grandchildren, and husband passed, "Many years ago." Patient didn't open up much about herself, will continue to focus on building relationship  Recommendations/Changes made from today's visit: -Reached out to Nephro about Simvastatin  Plan: -Patient requires high touches, will f/u every 2 months for a while   Subjective: Sara Hancock is an 87 y.o. year old female who is a primary patient of Cox, Kirsten, MD.  The CCM team was consulted for assistance with disease management and care coordination needs.    Engaged with patient by telephone for initial visit in response to provider referral for pharmacy case management and/or care coordination services.   Consent to Services:  The patient was given the following information about Chronic Care Management services today, agreed to services, and gave verbal consent: 1. CCM service includes personalized support from designated clinical staff supervised by the primary care provider, including individualized plan of care and coordination with other care providers 2. 24/7 contact phone numbers for assistance for urgent and routine care needs. 3. Service will only be billed when office clinical staff spend 20 minutes or more in a month to coordinate care. 4. Only one practitioner may furnish and bill the service in a calendar month. 5.The patient may stop CCM services at any time (effective at the end of the month) by phone call to the office staff. 6. The patient will be responsible for cost sharing (co-pay) of up to 20% of the service fee (after annual deductible is met). Patient agreed to services and consent obtained.  Patient Care Team: Rochel Brome, MD as PCP - General (Family Medicine) Berniece Salines, DO as PCP - Cardiology  (Cardiology) [name], MD as Consulting Physician (Gastroenterology) [name], MD as Consulting Physician (Cardiology) Ladene Artist, MD as Consulting Physician (Gastroenterology) Lane Hacker, [facility] as Pharmacist (Pharmacist) Joylene Grapes Shaune Pollack, MD as Consulting Physician (Internal Medicine) Lane Hacker, [facility] (Pharmacist)  Recent office visits:  05/14/21 Rochel Brome MD. 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 D/C Metolazone 5 mg.    04/29/21 Reinaldo Meeker MD. Seen for PAF. No med changes.    04/24/21 Reinaldo Meeker MD. Seen for Stasis Edema. No med changes.   04/11/21 Meredith Mody RN. Seen for Osteoporosis. Injected Prolia 30m in office. No med changes.    04/01/21 CRochel BromeMD. Orders Only. Levemir. Increase to 35 U in am and 20 U in pm. Discontinue farxiga and januvia due to Stage 4 CKD.   03/28/21 CRochel BromeMD. Seen for Weakness of hips. Decreased Irbesartan from 300 mg to 1568mdaily. D/C Janumet HCI 50-10007m  12/20/20 PerReinaldo Meeker. Seen for DM 2. No med changes.    Recent consult visits:  06/05/21 (Cardiology) TobBerniece Salines. 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 Started on Metolazone 5 mg weekly.   04/15/21 (Cardiology) Telephone Encounter. Tobb, Kardie DO. Please have her take the torsemide 20 mg for the next 3 days, then give her metolazone 5 mg for tomorrow to take 30 minutes before her torsemide dose   04/10/21 (Cardiology) TobBerniece Salines. 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    Hospital visits:  Medication Reconciliation was completed by comparing discharge summary,  patients EMR and Pharmacy list, and upon discussion with patient.   Admitted to the hospital on 02/11/21 due to Acute Renal failure.Marland Kitchen Discharge date was 02/15/21. Discharged from [place]?Medications Started at [facility] Discharge:?? -started Furosemide  20m , Eliquis 2.577m Flonase nasal spray, Lantus 100units and Lidocaine 5% patch   Medications Discontinued at Hospital Discharge: -Stopped Torsemide 2062mIrbesartan 300m66mevemir Flextouch 100units, Spironolactone 25mg47mAll other medications will remain the same.     Objective:  Lab Results  Component Value Date   CREATININE 2.40 (H) 06/10/2021   BUN 77 (HH) 06/10/2021   GFR 25.57 (L) 05/17/2020   EGFR 19 (L) 06/10/2021   GFRNONAA 25 (L) 06/26/2020   GFRAA 29 (L) 06/26/2020   NA 137 06/10/2021   K 5.2 06/10/2021   CALCIUM 9.4 06/10/2021   CO2 23 06/10/2021   GLUCOSE 277 (H) 06/10/2021    Lab Results  Component Value Date/Time   HGBA1C 9.7 (H) 03/25/2021 02:09 PM   HGBA1C 6.6 (H) 12/17/2020 03:57 PM   GFR 25.57 (L) 05/17/2020 12:06 PM   GFR 21.32 (L) 11/29/2019 08:33 AM    Last diabetic Eye exam:  Lab Results  Component Value Date/Time   HMDIABEYEEXA No Retinopathy 04/26/2020 12:00 AM    Last diabetic Foot exam: No results found for: HMDIABFOOTEX   Lab Results  Component Value Date   CHOL 123 03/25/2021   HDL 47 03/25/2021   LDLCALC 57 03/25/2021   TRIG 100 03/25/2021   CHOLHDL 2.6 03/25/2021    Hepatic Function Latest Ref Rng & Units 06/10/2021 05/14/2021 04/08/2021  Total Protein 6.0 - 8.5 g/dL 7.2 7.4 -  Albumin 3.6 - 4.6 g/dL 3.9 4.2 3.9  AST 0 - 40 IU/L 33 24 -  ALT 0 - 32 IU/L 24 15 -  Alk Phosphatase 44 - 121 IU/L 183(H) 196(H) -  Total Bilirubin 0.0 - 1.2 mg/dL 0.4 0.4 -  Bilirubin, Direct 0.0 - 0.3 mg/dL - - -    Lab Results  Component Value Date/Time   TSH 2.020 09/10/2020 11:24 AM   TSH 1.67 02/26/2017 11:12 AM    CBC Latest Ref Rng & Units 06/10/2021 03/25/2021 01/28/2021  WBC 3.4 - 10.8 x10E3/uL 7.9 6.6 7.4  Hemoglobin 11.1 - 15.9 g/dL 10.6(L) 9.4(L) 9.2(L)  Hematocrit 34.0 - 46.6 % 32.1(L) 28.0(L) 28.3(L)  Platelets 150 - 450 x10E3/uL 248 281 264    No results found for: VD25OH  Clinical ASCVD: No  The ASCVD Risk score (Arnett  DK, et al., 2019) failed to calculate for the following reasons:   The 2019 ASCVD risk score is only valid for ages 40 to389   25epression screen PHQ 2/9 05/14/2021 03/28/2021 12/20/2020  Decreased Interest 0 0 0  Down, Depressed, Hopeless 0 0 0  PHQ - 2 Score 0 0 0  Altered sleeping - - -  Tired, decreased energy - - -  Change in appetite - - -  Feeling bad or failure about yourself  - - -  Trouble concentrating - - -  Moving slowly or fidgety/restless - - -  Suicidal thoughts - - -  PHQ-9 Score - - -  Difficult doing work/chores - - -  Some recent data might be hidden     Other: (CHADS2VASc if Afib, MMRC or CAT for COPD, ACT, DEXA)  Social History   Tobacco Use  Smoking Status Never  Smokeless Tobacco Never   BP Readings from Last 3 Encounters:  06/05/21 120/80  05/14/21 108/64  04/29/21 120/80   Pulse Readings from Last 3 Encounters:  06/05/21 92  05/14/21 84  04/29/21 (!) 120   Wt Readings from Last 3 Encounters:  06/05/21 154 lb 3.2 oz (69.9 kg)  05/14/21 158 lb (71.7 kg)  04/29/21 173 lb (78.5 kg)   BMI Readings from Last 3 Encounters:  06/05/21 28.20 kg/m  05/14/21 28.90 kg/m  04/29/21 31.64 kg/m    Assessment/Interventions: Review of patient past medical history, allergies, medications, health status, including review of consultants reports, laboratory and other test data, was performed as part of comprehensive evaluation and provision of chronic care management services.   SDOH:  (Social Determinants of Health) assessments and interventions performed: Yes SDOH Interventions    Flowsheet Row Most Recent Value  SDOH Interventions   Financial Strain Interventions Intervention Not Indicated  Transportation Interventions Intervention Not Indicated      SDOH Screenings   Alcohol Screen: Low Risk    Last Alcohol Screening Score (AUDIT): 0  Depression (PHQ2-9): Low Risk    PHQ-2 Score: 0  Financial Resource Strain: Low Risk    Difficulty of Paying  Living Expenses: Not hard at all  Food Insecurity: Not on file  Housing: Not on file  Physical Activity: Not on file  Social Connections: Not on file  Stress: Not on file  Tobacco Use: Low Risk    Smoking Tobacco Use: Never   Smokeless Tobacco Use: Never   Passive Exposure: Not on file  Transportation Needs: No Transportation Needs   Lack of Transportation (Medical): No   Lack of Transportation (Non-Medical): No    CCM Care Plan  Allergies  Allergen Reactions   Ace Inhibitors Anaphylaxis   Capoten [Captopril] Anaphylaxis    Throat swells shut   Neomycin-Bacitracin Zn-Polymyx Rash    Medications Reviewed Today     Reviewed by Lane Hacker, [facility] (Pharmacist) on 06/17/21 at 1447  Med List Status: <None>   Medication Order Taking? Sig Documenting Provider Last Dose Status Informant  ACCU-CHEK AVIVA PLUS test strip [id number] Yes USE TO TEST TWICE DAILY CoxElnita Maxwell, MD Taking Active   Accu-Chek FastClix Lancets MISC [id number] Yes USE TO CHECK BLOOD SUGAR TWICE DAILY Cox, Kirsten, MD Taking Active   apixaban (ELIQUIS) 2.5 MG TABS tablet [id number] Yes Take 1 tablet (2.5 mg total) by mouth 2 (two) times daily. Tobb, Kardie, DO Taking Active   calcium-vitamin D (OSCAL WITH D) 250-125 MG-UNIT per tablet [id number] Yes Take 1 tablet by mouth daily. [provider] Taking Active Self  Cholecalciferol (VITAMIN D3) 1000 units CAPS [id number] Yes Take 1,000 Units by mouth daily. [provider] Taking Active Self  fexofenadine (ALLEGRA ALLERGY) 60 MG tablet [id number]  Take 1 tablet (60 mg total) by mouth 2 (two) times daily. Cox, Kirsten, MD  Active   fluticasone [facility]) 50 MCG/ACT nasal spray [id number]  Place 1 spray into both nostrils as needed.  [provider]  Active   folic acid (FOLVITE) 063 MCG tablet [id number] Yes Take 400 mcg by mouth daily. [provider] Taking Active Self  hydrALAZINE (APRESOLINE) 100 MG tablet [id number] Yes Take 1 tablet  (100 mg total) by mouth 2 (two) times daily. Cox, Kirsten, MD Taking Active   insulin detemir (LEVEMIR FLEXTOUCH) 100 UNIT/ML FlexPen [id number] Yes INJECT 38 UNITS UNDER THE SKIN EVERY MORNING AND 22 UNITS WITH SUPPER Cox, Kirsten, MD Taking Active   Insulin Pen Needle (BD PEN NEEDLE NANO 2ND GEN) 32G X 4 MM  [place] [id number] Yes Use a new needle each time when checking FBS Cox, Kirsten, MD Taking Active   irbesartan (AVAPRO) 150 MG tablet [id number] Yes Take 150 mg by mouth daily. [provider] Taking Active   metolazone (ZAROXOLYN) 5 MG tablet [id number] Yes Take 1 tablet (5 mg total) by mouth once a week. Tobb, Kardie, DO Taking Active   metoprolol tartrate (LOPRESSOR) 100 MG tablet [id number] Yes TAKE 1 TABLET(100 MG) BY MOUTH TWICE DAILY Cox, Kirsten, MD Taking Active   Multiple Vitamin (MULTIVITAMIN WITH MINERALS) TABS [id number]  Take 1 tablet by mouth daily. [provider]  Active Self  nystatin cream (MYCOSTATIN) [id number]  APPLY SUFFICIENT AMOUNT EXTERNALLY TO THE AFFECTED AREA TWICE DAILY Cox, Kirsten, MD  Active   pantoprazole (PROTONIX) 40 MG tablet [id number] Yes TAKE 1 TABLET BY MOUTH EVERY DAY Cox, Kirsten, MD Taking Active   simvastatin (ZOCOR) 40 MG tablet [id number] Yes TAKE 1 TABLET BY MOUTH AT BEDTIME Cox, Kirsten, MD Taking Active   spironolactone (ALDACTONE) 25 MG tablet [id number] Yes Take 25 mg by mouth daily. [provider] Taking Active   torsemide (DEMADEX) 20 MG tablet [id number] Yes TAKE 2 TABLETS BY MOUTH TWICE DAILY  Patient taking differently: 20 mg daily.   Berniece Salines, DO Taking Active   ursodiol (ACTIGALL) 300 MG capsule [id number]  TAKE 1 CAPSULE(300 MG) BY MOUTH TWICE DAILY Ladene Artist, MD  Active   vitamin C (ASCORBIC ACID) 500 MG tablet [id number]  Take 500 mg by mouth daily. [provider]  Active Self  vitamin E 400 UNIT capsule [id number]  Take 400 Units by mouth daily. [provider]  Active Self             Patient Active Problem List   Diagnosis Date Noted   Acute on chronic congestive heart failure ([place]) 90/24/0973   Acute diastolic heart failure ([place]) 04/29/2021   Stasis edema of left lower extremity 04/24/2021   Weakness of both hips 03/31/2021   Anemia due to stage 4 chronic kidney disease (Hancock) 09/12/2020   CKD stage 4 due to type 2 diabetes mellitus ([place]) 09/12/2020   Senile osteoporosis 09/12/2020   B12 deficiency 09/12/2020   PAF (paroxysmal atrial fibrillation) (HCC)    Arthritis    Biliary cirrhosis (HCC)    Chronic kidney disease, stage 3 (HCC)    GERD (gastroesophageal reflux disease)    Hyperplastic colon polyp    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, with stage 1 through stage 4 chronic kidney disease, or unspecified chronic kidney disease    Long term (current) use of insulin ([place])    Mixed hyperlipidemia    Osteopenia    Other iron deficiency anemias    Other specified menopausal and perimenopausal disorders    Pure hypercholesterolemia    Acquired thrombophilia ([place]) 09/11/2019   Stage 3b chronic kidney disease ([place]) 09/11/2019   Combined form of age-related cataract, both eyes 02/26/2017   Glaucoma suspect of both eyes 02/26/2017   Posterior vitreous detachment, bilateral 01/10/2016   Allergic rhinitis 09/17/2015   Osteoarthritis of right thumb 09/17/2015   Diabetes mellitus type 2 without retinopathy ([place]) 10/27/2013   Paraesophageal hiatal hernia, 3cm by EGD 03/22/2012   Uterine fibroid 01/09/2012   Nuclear cataract 07/03/2011   Retinal tear, right 07/03/2011   Iron deficiency anemia 01/08/2011   Anticoagulant long-term use 01/08/2011   Benign neoplasm of colon 01/08/2011   Esophageal reflux 01/08/2011   Esophageal stenosis 01/08/2011  Hiatal hernia 01/08/2011   Hx of adenomatous colonic polyps 01/08/2011   ANEMIA, NORMOCYTIC 10/27/2007   DIVERTICULOSIS, COLON, WITH HEMORRHAGE 10/27/2007   Primary biliary cholangitis  ([place]) 10/27/2007   History of IBS 10/27/2007    Immunization History  Administered Date(s) Administered   Fluad Quad(high Dose 65+) 03/01/2020   Hep A / Hep B 05/03/2019, 05/10/2019, 08/08/2019, 05/17/2020   Influenza, High Dose Seasonal PF 03/14/2016   Influenza-Unspecified 03/14/2016, 01/10/2018, 03/28/2021   PFIZER Comirnaty(Gray Top)Covid-19 Tri-Sucrose Vaccine 10/17/2020   PFIZER(Purple Top)SARS-COV-2 Vaccination 05/28/2019, 06/18/2019, 02/09/2020   Pfizer Covid-19 Vaccine Bivalent Booster 55yr & up 02/04/2021   Pneumococcal Conjugate-13 10/24/2013   Pneumococcal Polysaccharide-23 05/12/2012, 05/20/2012   Td 05/12/2012   Tdap 03/31/2018   Zoster Recombinat (Shingrix) 02/14/2018, 05/10/2018   Zoster, Live 09/08/2013    Conditions to be addressed/monitored:  Hypertension, Hyperlipidemia, Diabetes, Atrial Fibrillation, and Heart Failure  Care Plan : CBurney Updates made by KLane Hacker RPH since 06/17/2021 12:00 AM     Problem: DM, HTN, CKD, AFib   Priority: High  Onset Date: 06/17/2021     Long-Range Goal: Disease State Management   Start Date: 06/17/2021  Expected End Date: 06/17/2022  This Visit's Progress: On track  Priority: High  Note:   Current Barriers:  Does not maintain contact with provider office Does not contact provider office for questions/concerns  Pharmacist Clinical Goal(s):  Patient will achieve adherence to monitoring guidelines and medication adherence to achieve therapeutic efficacy through collaboration with PharmD and provider.   Interventions: 1:1 collaboration with CRochel Brome MD regarding development and update of comprehensive plan of care as evidenced by provider attestation and co-signature Inter-disciplinary care team collaboration (see longitudinal plan of care) Comprehensive medication review performed; medication list updated in electronic medical record    Hyperlipidemia: (LDL goal < 70) The ASCVD Risk score  (Arnett DK, et al., 2019) failed to calculate for the following reasons:   The 2019 ASCVD risk score is only valid for ages 484to 789Lab Results  Component Value Date   CHOL 123 03/25/2021   CHOL 111 12/17/2020   CHOL 120 09/10/2020   Lab Results  Component Value Date   HDL 47 03/25/2021   HDL 46 12/17/2020   HDL 49 09/10/2020   Lab Results  Component Value Date   LDLCALC 57 03/25/2021   LDLCALC 50 12/17/2020   LDLCALC 56 09/10/2020   Lab Results  Component Value Date   TRIG 100 03/25/2021   TRIG 73 12/17/2020   TRIG 70 09/10/2020   Lab Results  Component Value Date   CHOLHDL 2.6 03/25/2021   CHOLHDL 2.4 12/17/2020   CHOLHDL 2.4 09/10/2020  No results found for: LDLDIRECT -Controlled -Current treatment: Simvastatin 461mQuery Appropriate,  -Medications previously tried: None  -Current dietary patterns: "Tries to eat healthy" -Current exercise habits: None -Educated on Cholesterol goals;  Feb 2023: Simvastatin package insert says, "Renal Dosing: CrCl 15-29: Start 67m36mPM, CrCl<15 not defined." [phone] spoke with AmaEstill Bamberg ask her about Simvastatin dose, if it was acceptable or if she should be switched to something like Atorvastatin. Made sure to emphasize that I was not changing her myself and that I didn't tell the patient, but that I wanted to see with her Kidney Function what the specialist preferred. They will call me back  Diabetes (A1c goal <7%) Lab Results  Component Value Date   HGBA1C 9.7 (H) 03/25/2021   HGBA1C  6.6 (H) 12/17/2020   HGBA1C 6.8 (H) 09/10/2020   Lab Results  Component Value Date   LDLCALC 57 03/25/2021   CREATININE 2.40 (H) 06/10/2021   Lab Results  Component Value Date   NA 137 06/10/2021   K 5.2 06/10/2021   CREATININE 2.40 (H) 06/10/2021   EGFR 19 (L) 06/10/2021   GFRNONAA 25 (L) 06/26/2020   GLUCOSE 277 (H) 06/10/2021   Lab Results  Component Value Date   WBC 7.9  06/10/2021   HGB 10.6 (L) 06/10/2021   HCT 32.1 (L) 06/10/2021   MCV 96 06/10/2021   PLT 248 06/10/2021  -Not ideally controlled -Current medications: Levemir 38 AM - 22 PM Appropriate, Effective, Safe, Accessible -Medications previously tried: N/A  -Current home glucose readings Feb 2023: (PM dose is post-prandial) 06/17/21: 104AM - 06/16/21: 92AM - 180PM 06/15/21:  95AM-204PM -Denies hypoglycemic/hyperglycemic symptoms -Current meal patterns: Patient wasn't open and didn't talk, just said she "Tries to eat healthy" -Current exercise: None -Educated on A1c and blood sugar goals; -Counseled to check feet daily and get yearly eye exams -Recommended to continue current medication  Atrial Fibrillation (Goal: prevent stroke and major bleeding) -Managed by Dr. Harriet Masson, Cardio -Controlled -CHADSVASC: 6 -Current treatment: Rate control:  Hydralazine 120m BID Appropriate, Effective, Safe, Accessible Irbesartan 1562mQD Appropriate, Effective, Safe, Accessible Metolazone 55m655meek Appropriate, Effective, Safe, Accessible Metoprolol Tart 100m66mD Appropriate, Effective, Safe, Accessible Spironolactone 255mg79mAppropriate, Effective, Safe, Accessible Torsemide 20mg 24mppropriate, Effective, Safe, Accessible Anticoagulation:  Apixiban 2.55mg BI14mppropriate, Effective, Safe, Accessible -Medications previously tried: N/A -Home BP and HR readings:   Feb 2022: 06/17/21: 123/83    HR: 87 06/16/21: 116/71  HR: 84 06/15/21: 118/78   -Counseled on importance of adherence to anticoagulant exactly as prescribed; -Recommended to continue current medication  Heart Failure (Goal: manage symptoms and prevent exacerbations) -Controlled -Last ejection fraction: Unknown (Date: 06/05/21 Note from Cardio) -HF type: Diastolic -NYHA Class: Unknown (Nothing in 06/05/21 note from Cardio) -AHA HF Stage: Unknown (Nothing in 06/05/21 note from Cardio) -Current treatment: Hydralazine 100mg BI51mpropriate, Effective,  Safe, Accessible Irbesartan 150mg QD 55mopriate, Effective, Safe, Accessible Metolazone 55mg/week 72mropriate, Effective, Safe, Accessible Metoprolol Tart 100mg BID A84mpriate, Effective, Safe, Accessible Spironolactone 255mg QD App7miate, Effective, Safe, Accessible Torsemide 20mg QD Appr23mate, Effective, Safe, Accessible -Medications previously tried: N/A  -Current home BP/HR readings: (See above) -Current home weight Feb 2023: 06/17/21: 152.4 06/16/21: 154.4 06/15/21: 152.2   -Current dietary habits: "Tries to eat healthy" -Current exercise habits: None -Educated on Benefits of medications for managing symptoms and prolonging life Feb 2023: Patient mentioned that her Torsemide was changed. She didn't think this was a good idea because fluid started building up, she called a few doctors who didn't get back to her. She ended up in the hospital due to fluid overload. She feels unheard and I made sure to stress that I am her to advocate for her and gave her my number to call   Patient Goals/Self-Care Activities Patient will:  - take medications as prescribed as evidenced by patient report and record review  Follow Up Plan: The patient has been provided with contact information for the care management team and has been advised to call with any health related questions or concerns.   CPP F/U April 2023  Xitlali Kastens [place] Constable33Sherian Rein-[id number]        Medication Assistance: None required.  Patient affirms current coverage meets needs.  Compliance/Adherence/Medication fill history: Star Rating Drugs:  Medication:  Last Fill:         Day Supply Irbesartan                    03/21/21          90ds                                     09/13/20              90ds Simvastatin                 04/19/21            90ds                                     09/13/20              90ds   Care Gaps: Last annual wellness visit? None noted  Mammogram: 05/21/21 Dexa Scan: 08/01/19 Last eye  exam / retinopathy screening? 04/26/20 Last diabetic foot exam? 09/12/20  Patient's preferred pharmacy is:  [id number] [address], [place] AT [address] [address] Phone: [phone] Fax: [phone]  Uses pill box? Yes Pt endorses 100% compliance  We discussed: Benefits of medication synchronization, packaging and delivery as well as enhanced pharmacist oversight with [facility]. Patient decided to: Continue current medication management strategy -Feb 2023: Patient wasn't interested in [facility], she managed her meds herself and wants to continue to do so.  She doesn't understand why she'd switch. Will not bring up again  -Reached out to Nephro about Simvastatin dose  Care Plan and Follow Up Patient Decision:  Patient agrees to Care Plan and Follow-up.  Plan: The patient has been provided with contact information for the care management team and has been advised to call with any health related questions or concerns.   CPP F/U 2 Months  [place] Con[place], [place].D. - [phone]

## 2021-06-17 NOTE — Patient Instructions (Signed)
Visit Information   Goals Addressed             This Visit's Progress    Set My Target A1C-Diabetes Type 2       Timeframe:  Long-Range Goal Priority:  High Start Date:                             Expected End Date:                       Follow Up Date 06/17/22   - set target A1C    Why is this important?   Your target A1C is decided together by you and your doctor.  It is based on several things like your age and other health issues.    Notes:      Track and Manage My Blood Pressure-Hypertension       Timeframe:  Long-Range Goal Priority:  High Start Date:                             Expected End Date:                       Follow Up Date 06/17/22   - check blood pressure daily    Why is this important?   You won't feel high blood pressure, but it can still hurt your blood vessels.  High blood pressure can cause heart or kidney problems. It can also cause a stroke.  Making lifestyle changes like losing a little weight or eating less salt will help.  Checking your blood pressure at home and at different times of the day can help to control blood pressure.  If the doctor prescribes medicine remember to take it the way the doctor ordered.  Call the office if you cannot afford the medicine or if there are questions about it.     Notes:        Patient Care Plan: CCM Pharmacy Care Plan     Problem Identified: DM, HTN, CKD, AFib   Priority: High  Onset Date: 06/17/2021     Long-Range Goal: Disease State Management   Start Date: 06/17/2021  Expected End Date: 06/17/2022  This Visit's Progress: On track  Priority: High  Note:   Current Barriers:  Does not maintain contact with provider office Does not contact provider office for questions/concerns  Pharmacist Clinical Goal(s):  Patient will achieve adherence to monitoring guidelines and medication adherence to achieve therapeutic efficacy through collaboration with PharmD and provider.   Interventions: 1:1  collaboration with Rochel Brome, MD regarding development and update of comprehensive plan of care as evidenced by provider attestation and co-signature Inter-disciplinary care team collaboration (see longitudinal plan of care) Comprehensive medication review performed; medication list updated in electronic medical record    Hyperlipidemia: (LDL goal < 70) The ASCVD Risk score (Arnett DK, et al., 2019) failed to calculate for the following reasons:   The 2019 ASCVD risk score is only valid for ages 30 to 22 Lab Results  Component Value Date   CHOL 123 03/25/2021   CHOL 111 12/17/2020   CHOL 120 09/10/2020   Lab Results  Component Value Date   HDL 47 03/25/2021   HDL 46 12/17/2020   HDL 49 09/10/2020   Lab Results  Component Value Date   LDLCALC 57 03/25/2021   LDLCALC 50 12/17/2020  LDLCALC 56 09/10/2020   Lab Results  Component Value Date   TRIG 100 03/25/2021   TRIG 73 12/17/2020   TRIG 70 09/10/2020   Lab Results  Component Value Date   CHOLHDL 2.6 03/25/2021   CHOLHDL 2.4 12/17/2020   CHOLHDL 2.4 09/10/2020  No results found for: LDLDIRECT -Controlled -Current treatment: Simvastatin 76m Query Appropriate,  -Medications previously tried: None  -Current dietary patterns: "Tries to eat healthy" -Current exercise habits: None -Educated on Cholesterol goals;  Feb 2023: Simvastatin package insert says, "Renal Dosing: CrCl 15-29: Start 538mQPM, CrCl<15 not defined." [phone] spoke with AmEstill Bambergo ask her about Simvastatin dose, if it was acceptable or if she should be switched to something like Atorvastatin. Made sure to emphasize that I was not changing her myself and that I didn't tell the patient, but that I wanted to see with her Kidney Function what the specialist preferred. They will call me back  Diabetes (A1c goal <7%) Lab Results  Component Value Date   HGBA1C 9.7 (H) 03/25/2021   HGBA1C 6.6 (H)  12/17/2020   HGBA1C 6.8 (H) 09/10/2020   Lab Results  Component Value Date   LDLCALC 57 03/25/2021   CREATININE 2.40 (H) 06/10/2021   Lab Results  Component Value Date   NA 137 06/10/2021   K 5.2 06/10/2021   CREATININE 2.40 (H) 06/10/2021   EGFR 19 (L) 06/10/2021   GFRNONAA 25 (L) 06/26/2020   GLUCOSE 277 (H) 06/10/2021   Lab Results  Component Value Date   WBC 7.9 06/10/2021   HGB 10.6 (L) 06/10/2021   HCT 32.1 (L) 06/10/2021   MCV 96 06/10/2021   PLT 248 06/10/2021  -Not ideally controlled -Current medications: Levemir 38 AM - 22 PM Appropriate, Effective, Safe, Accessible -Medications previously tried: N/A  -Current home glucose readings Feb 2023: (PM dose is post-prandial) 06/17/21: 104AM - 06/16/21: 92AM - 180PM 06/15/21:  95AM-204PM -Denies hypoglycemic/hyperglycemic symptoms -Current meal patterns: Patient wasn't open and didn't talk, just said she "Tries to eat healthy" -Current exercise: None -Educated on A1c and blood sugar goals; -Counseled to check feet daily and get yearly eye exams -Recommended to continue current medication  Atrial Fibrillation (Goal: prevent stroke and major bleeding) -Managed by Dr. ToHarriet MassonCardio -Controlled -CHADSVASC: 6 -Current treatment: Rate control:  Hydralazine 10053mID Appropriate, Effective, Safe, Accessible Irbesartan 150m22m Appropriate, Effective, Safe, Accessible Metolazone 5mg/7mk Appropriate, Effective, Safe, Accessible Metoprolol Tart 100mg 28mAppropriate, Effective, Safe, Accessible Spironolactone 25mg Q73mpropriate, Effective, Safe, Accessible Torsemide 20mg QD6mropriate, Effective, Safe, Accessible Anticoagulation:  Apixiban 2.5mg BID 43mropriate, Effective, Safe, Accessible -Medications previously tried: N/A -Home BP and HR readings:   Feb 2022: 06/17/21: 123/83    HR: 87 06/16/21: 116/71  HR: 84 06/15/21: 118/78   -Counseled on importance of adherence to anticoagulant exactly as prescribed; -Recommended to  continue current medication  Heart Failure (Goal: manage symptoms and prevent exacerbations) -Controlled -Last ejection fraction: Unknown (Date: 06/05/21 Note from Cardio) -HF type: Diastolic -NYHA Class: Unknown (Nothing in 06/05/21 note from Cardio) -AHA HF Stage: Unknown (Nothing in 06/05/21 note from Cardio) -Current treatment: Hydralazine 100mg BID 75mopriate, Effective, Safe, Accessible Irbesartan 150mg QD Ap24mriate, Effective, Safe, Accessible Metolazone 5mg/week Ap92mpriate, Effective, Safe, Accessible Metoprolol Tart 100mg BID App66miate, Effective, Safe, Accessible Spironolactone 25mg QD Appro5mte, Effective, Safe, Accessible Torsemide 20mg QD Approp59me, Effective, Safe, Accessible -Medications previously tried: N/A  -Current home BP/HR readings: (See above) -Current home weight Feb 2023: 06/17/21: 152.4  06/16/21: 154.4 06/15/21: 152.2   -Current dietary habits: "Tries to eat healthy" -Current exercise habits: None -Educated on Benefits of medications for managing symptoms and prolonging life Feb 2023: Patient mentioned that her Torsemide was changed. She didn't think this was a good idea because fluid started building up, she called a few doctors who didn't get back to her. She ended up in the hospital due to fluid overload. She feels unheard and I made sure to stress that I am her to advocate for her and gave her my number to call   Patient Goals/Self-Care Activities Patient will:  - take medications as prescribed as evidenced by patient report and record review  Follow Up Plan: The patient has been provided with contact information for the care management team and has been advised to call with any health related questions or concerns.   CPP F/U April 2023  [place] Constable, Sherian Rein.D. - [phone]       Ms. Urey was given information about Chronic Care Management services today including:  CCM service includes personalized support from designated clinical  staff supervised by her physician, including individualized plan of care and coordination with other care providers 24/7 contact phone numbers for assistance for urgent and routine care needs. Standard insurance, coinsurance, copays and deductibles apply for chronic care management only during months in which we provide at least 20 minutes of these services. Most insurances cover these services at 100%, however patients may be responsible for any copay, coinsurance and/or deductible if applicable. This service may help you avoid the need for more expensive face-to-face services. Only one practitioner may furnish and bill the service in a calendar month. The patient may stop CCM services at any time (effective at the end of the month) by phone call to the office staff.  Patient agreed to services and verbal consent obtained.   The patient verbalized understanding of instructions, educational materials, and care plan provided today and declined offer to receive copy of patient instructions, educational materials, and care plan.  The pharmacy team will reach out to the patient again over the next 60 days.   Lane Hacker, [place]

## 2021-06-18 DIAGNOSIS — I5030 Unspecified diastolic (congestive) heart failure: Secondary | ICD-10-CM | POA: Diagnosis not present

## 2021-06-18 DIAGNOSIS — D509 Iron deficiency anemia, unspecified: Secondary | ICD-10-CM | POA: Diagnosis not present

## 2021-06-18 DIAGNOSIS — N184 Chronic kidney disease, stage 4 (severe): Secondary | ICD-10-CM | POA: Diagnosis not present

## 2021-06-18 DIAGNOSIS — D631 Anemia in chronic kidney disease: Secondary | ICD-10-CM | POA: Diagnosis not present

## 2021-06-18 DIAGNOSIS — I129 Hypertensive chronic kidney disease with stage 1 through stage 4 chronic kidney disease, or unspecified chronic kidney disease: Secondary | ICD-10-CM | POA: Diagnosis not present

## 2021-06-19 DIAGNOSIS — H26492 Other secondary cataract, left eye: Secondary | ICD-10-CM | POA: Diagnosis not present

## 2021-06-20 ENCOUNTER — Encounter: Payer: Self-pay | Admitting: Family Medicine

## 2021-06-20 ENCOUNTER — Telehealth: Payer: Self-pay

## 2021-06-20 ENCOUNTER — Ambulatory Visit (INDEPENDENT_AMBULATORY_CARE_PROVIDER_SITE_OTHER): Payer: Medicare Other | Admitting: Family Medicine

## 2021-06-20 ENCOUNTER — Other Ambulatory Visit: Payer: Self-pay

## 2021-06-20 VITALS — BP 112/76 | HR 64 | Temp 96.6°F | Wt 154.0 lb

## 2021-06-20 DIAGNOSIS — I480 Paroxysmal atrial fibrillation: Secondary | ICD-10-CM

## 2021-06-20 DIAGNOSIS — H43813 Vitreous degeneration, bilateral: Secondary | ICD-10-CM | POA: Diagnosis not present

## 2021-06-20 DIAGNOSIS — I951 Orthostatic hypotension: Secondary | ICD-10-CM

## 2021-06-20 DIAGNOSIS — H33311 Horseshoe tear of retina without detachment, right eye: Secondary | ICD-10-CM | POA: Diagnosis not present

## 2021-06-20 DIAGNOSIS — H40003 Preglaucoma, unspecified, bilateral: Secondary | ICD-10-CM | POA: Diagnosis not present

## 2021-06-20 DIAGNOSIS — E1122 Type 2 diabetes mellitus with diabetic chronic kidney disease: Secondary | ICD-10-CM

## 2021-06-20 DIAGNOSIS — E119 Type 2 diabetes mellitus without complications: Secondary | ICD-10-CM | POA: Diagnosis not present

## 2021-06-20 DIAGNOSIS — I9589 Other hypotension: Secondary | ICD-10-CM | POA: Diagnosis not present

## 2021-06-20 LAB — HM DIABETES EYE EXAM

## 2021-06-20 NOTE — Telephone Encounter (Signed)
Patient calling as she went to eye dr yesterday and had hypotension. Readings were 86/47, one hour later 80/50. She did not feel like her herself, can not say if she was dizzy or lightheaded. She feels like her BG may have been dropping as well. Today her BP is 129/86. She feels normal today and BG is 114.   Advised she come in for an appointment. This was made for this afternoon but she also has an appointment that may cause her to cancel. She will return call if this is the case and will address at that time. Also advise she not take BP medications until seen or call back. She VU of this plan. Will make provider aware.   [name], [place] 06/20/21 10:19 AM

## 2021-06-20 NOTE — Progress Notes (Signed)
Acute Office Visit  Subjective:    Patient ID: Sara Hancock, female    DOB: 04/30/1935, 87 y.o.   MRN: [id number]  Chief Complaint  Patient presents with   Hypotension    HPI: Patient is in today for hypotension.State she was seen at the eye doctor yesterday and her b/p was 83/47 p80, 80/50 p80 but this morning at home her reading was 125/84. Reports she was sitting the whole visit yesterday when they took her b/p however the eye chart was "fuzzy". Today she was able to see the eye chart fine today. Denies feeling light headed or nauseous. Has not taken any medication today.  Patient held her hydralazine last night.  Held hydralazine, irbesartan, and metoprolol today. Normally bp runs 120/80s. On torsemide 20 mg 2 twice daily. Swelling is good.   No bloody or black bms. No chest pain.    Past Medical History:  Diagnosis Date   Acquired thrombophilia ([place]) 09/11/2019   Allergic rhinitis 09/17/2015   Anemia    ANEMIA, NORMOCYTIC 10/27/2007   Qualifier: Diagnosis of  By: Nils Pyle CMA (AAMA), Leisha     Anticoagulant long-term use 01/08/2011   Arthritis    Benign neoplasm of colon 01/08/2011   Biliary cirrhosis ([place])    BRADYCARDIA 11/15/2008   Qualifier: Diagnosis of  By: Hollie Salk CMA, Amanda     Cellulitis of left foot 09/25/2016   Cholelithiasis 01/09/12   Chronic kidney disease, stage 3 (HCC)    Combined form of age-related cataract, both eyes 02/26/2017   Diabetes mellitus type 2 without retinopathy ([place]) 10/27/2013   Diverticulosis of colon with hemorrhage 06/08/2000   Dyslipidemia 09/17/2015   Encounter for therapeutic drug monitoring 06/09/2013   Esophageal reflux 01/08/2011   Esophageal stenosis 01/08/11   Esophagitis 01/08/11   GALLSTONES 10/26/2008   Qualifier: Diagnosis of  By: Sharlett Iles MD Byrd Hesselbach    Gastritis, erosive chronic 01/08/2011   GERD (gastroesophageal reflux disease)    Glaucoma suspect of both eyes 02/26/2017   Glaucoma suspect of both eyes 02/26/2017    Hiatal hernia 01/08/11   History of IBS 10/27/2007   Qualifier: Diagnosis of  By: Nils Pyle CMA (AAMA), Mearl Latin     HTN (hypertension)    Hx of adenomatous colonic polyps 01/08/11   Hyperplastic colon polyp 06/08/2000, 07/31/2003, 01/08/11   Hypertension, essential 10/27/2007   Qualifier: Diagnosis of  By: Nils Pyle CMA (AAMA), Mearl Latin     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, with stage 1 through stage 4 chronic kidney disease, or unspecified chronic kidney disease    Iron deficiency anemia 01/08/2011   Left foot pain 09/25/2016   Localized edema    Long term (current) use of insulin (HCC)    Mixed hyperlipidemia    Nuclear cataract 07/03/2011   Obesity    Obesity (BMI 30-39.9) 03/22/2012   Osteoarthritis of right thumb 09/17/2015   Osteopenia    Other iron deficiency anemias    Other specified menopausal and perimenopausal disorders    PAF (paroxysmal atrial fibrillation) (HCC)    occurring in the setting of bradycardia and mild first degree AV block followed by Dr. 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   Qualifier: Diagnosis of  By: Nils Pyle CMA (AAMA), Mearl Latin     Pure  hypercholesterolemia    Retinal tear, right 07/03/2011   Shortness of breath 12/13/2019   Stage 3b chronic kidney disease ([place]) 09/11/2019   Type II or unspecified type diabetes mellitus without mention of complication, not stated as uncontrolled    Uterine fibroid 01/09/12    Past Surgical History:  Procedure Laterality Date   BREAST CYST EXCISION     left   CARDIOVERSION N/A 11/27/2017   Procedure: CARDIOVERSION;  Surgeon: Sanda Klein, MD;  Location: [facility];  Service: Cardiovascular;  Laterality: N/A;   COLONOSCOPY     INSERTION OF MESH  05/18/2012   Procedure: INSERTION OF  MESH;  Surgeon: Adin Hector, MD;  Location: [facility];  Service: General;  Laterality: N/A;   LIVER BIOPSY  05/18/2012   Procedure: LIVER BIOPSY;  Surgeon: Adin Hector, MD;  Location: [facility];  Service: General;;   POLYPECTOMY     THYROIDECTOMY, PARTIAL  1980   [place]  05/18/2012   Procedure: LAPAROSCOPIC VENTRAL HERNIA;  Surgeon: Adin Hector, MD;  Location: [facility];  Service: General;  Laterality: N/A;  Laparoscopic Ventral Wall Hernia with Mesh    Family History  Problem Relation Age of Onset   Heart attack Father    Stroke Mother    Diabetes Sister        x 2   Aneurysm Sister    Stroke Sister    Congestive Heart Failure Sister    Diabetes Brother    Kidney failure Other    Hyperlipidemia Other    Arrhythmia Other    Congestive Heart Failure Other    Hypertension Other    Arthritis Other    Colon cancer Neg Hx    Esophageal cancer Neg Hx    Stomach cancer Neg Hx    Rectal cancer Neg Hx     Social History   Socioeconomic History   Marital status: Married    Spouse name: Not on file   Number of children: 2   Years of education: Not on file   Highest education level: Not on file  Occupational History   Occupation: retired    Fish farm manager: RETIRED  Tobacco Use   Smoking status: Never   Smokeless tobacco: Never  Vaping Use   Vaping Use: Never used  Substance and Sexual Activity   Alcohol use: No   Drug use: No   Sexual activity: Not Currently  Other Topics Concern   Not on file  Social History Narrative   Not on file   Social Determinants of Health   Financial Resource Strain: Low Risk    Difficulty of Paying Living Expenses: Not hard at all  Food Insecurity: Not on file  Transportation Needs: No Transportation Needs   Lack of Transportation (Medical): No   Lack of Transportation (Non-Medical): No  Physical Activity: Not on file  Stress: Not on file  Social Connections: Not on file  Intimate Partner Violence: Not  on file    Outpatient Medications Prior to Visit  Medication Sig Dispense Refill   ACCU-CHEK AVIVA PLUS test strip USE TO TEST TWICE DAILY 200 strip 3   Accu-Chek FastClix Lancets MISC USE TO CHECK BLOOD SUGAR TWICE DAILY 204 each 3   apixaban (ELIQUIS) 2.5 MG TABS tablet Take 1 tablet (2.5 mg total) by mouth 2 (two) times daily. 180 tablet 1   calcium-vitamin D (OSCAL WITH D) 250-125 MG-UNIT per tablet Take 1 tablet by mouth daily.  Cholecalciferol (VITAMIN D3) 1000 units CAPS Take 1,000 Units by mouth daily.     fexofenadine (ALLEGRA ALLERGY) 60 MG tablet Take 1 tablet (60 mg total) by mouth 2 (two) times daily. 180 tablet 1   fluticasone (FLONASE) 50 MCG/ACT nasal spray Place 1 spray into both nostrils as needed.      folic acid (FOLVITE) 800 MCG tablet Take 400 mcg by mouth daily.     hydrALAZINE (APRESOLINE) 100 MG tablet Take 1 tablet (100 mg total) by mouth 2 (two) times daily. 180 tablet 0   insulin detemir (LEVEMIR FLEXTOUCH) 100 UNIT/ML FlexPen INJECT 38 UNITS UNDER THE SKIN EVERY MORNING AND 22 UNITS WITH SUPPER 45 mL 1   Insulin Pen Needle (BD PEN NEEDLE NANO 2ND GEN) 32G X 4 MM MISC Use a new needle each time when checking FBS 200 each 3   irbesartan (AVAPRO) 150 MG tablet Take 150 mg by mouth daily.     metolazone (ZAROXOLYN) 5 MG tablet Take 1 tablet (5 mg total) by mouth once a week. 4 tablet 6   metoprolol tartrate (LOPRESSOR) 100 MG tablet TAKE 1 TABLET(100 MG) BY MOUTH TWICE DAILY 180 tablet 3   Multiple Vitamin (MULTIVITAMIN WITH MINERALS) TABS Take 1 tablet by mouth daily.     nystatin cream (MYCOSTATIN) APPLY SUFFICIENT AMOUNT EXTERNALLY TO THE AFFECTED AREA TWICE DAILY 30 g 1   pantoprazole (PROTONIX) 40 MG tablet TAKE 1 TABLET BY MOUTH EVERY DAY 90 tablet 3   simvastatin (ZOCOR) 40 MG tablet TAKE 1 TABLET BY MOUTH AT BEDTIME 90 tablet 1   spironolactone (ALDACTONE) 25 MG tablet Take 25 mg by mouth daily.     torsemide (DEMADEX) 20 MG tablet TAKE 2 TABLETS BY MOUTH  TWICE DAILY (Patient taking differently: 20 mg daily.) 360 tablet 0   ursodiol (ACTIGALL) 300 MG capsule TAKE 1 CAPSULE(300 MG) BY MOUTH TWICE DAILY 180 capsule 0   vitamin C (ASCORBIC ACID) 500 MG tablet Take 500 mg by mouth daily.     vitamin E 400 UNIT capsule Take 400 Units by mouth daily.     No facility-administered medications prior to visit.    Allergies  Allergen Reactions   Ace Inhibitors Anaphylaxis   Capoten [Captopril] Anaphylaxis    Throat swells shut   Neomycin-Bacitracin Zn-Polymyx Rash    Review of Systems  Constitutional:  Negative for chills, fatigue and fever.  HENT:  Negative for congestion.   Respiratory:  Negative for cough and shortness of breath.   Cardiovascular:  Negative for chest pain.  Gastrointestinal:  Negative for blood in stool, diarrhea, nausea and vomiting.  Neurological:  Negative for dizziness and headaches.      Objective:    Physical Exam Vitals reviewed.  Constitutional:      Appearance: Normal appearance. She is normal weight.  Neck:     Vascular: No carotid bruit.  Cardiovascular:     Rate and Rhythm: Normal rate. Rhythm irregular.     Heart sounds: Normal heart sounds.  Pulmonary:     Effort: Pulmonary effort is normal. No respiratory distress.     Breath sounds: Normal breath sounds.  Abdominal:     General: Abdomen is flat. Bowel sounds are normal.     Palpations: Abdomen is soft.     Tenderness: There is no abdominal tenderness.  Musculoskeletal:     Right lower leg: No edema.     Left lower leg: No edema.  Neurological:     Mental Status: She is alert  and oriented to person, place, and time.  Psychiatric:        Mood and Affect: Mood normal.        Behavior: Behavior normal.    BP 112/76    Pulse 64    Temp (!) 96.6 F (35.9 C)    Wt 154 lb (69.9 kg)    SpO2 97%    BMI 28.17 kg/m  Wt Readings from Last 3 Encounters:  06/20/21 154 lb (69.9 kg)  06/05/21 154 lb 3.2 oz (69.9 kg)  05/14/21 158 lb (71.7 kg)    Orthostatics: positive.   No data found.  Health Maintenance Due  Topic Date Due   OPHTHALMOLOGY EXAM  04/26/2021    There are no preventive care reminders to display for this patient.   Lab Results  Component Value Date   TSH 2.020 09/10/2020   Lab Results  Component Value Date   WBC 7.9 06/10/2021   HGB 10.6 (L) 06/10/2021   HCT 32.1 (L) 06/10/2021   MCV 96 06/10/2021   PLT 248 06/10/2021   Lab Results  Component Value Date   NA 137 06/10/2021   K 5.2 06/10/2021   CO2 23 06/10/2021   GLUCOSE 277 (H) 06/10/2021   BUN 77 (HH) 06/10/2021   CREATININE 2.40 (H) 06/10/2021   BILITOT 0.4 06/10/2021   ALKPHOS 183 (H) 06/10/2021   AST 33 06/10/2021   ALT 24 06/10/2021   PROT 7.2 06/10/2021   ALBUMIN 3.9 06/10/2021   CALCIUM 9.4 06/10/2021   EGFR 19 (L) 06/10/2021   GFR 25.57 (L) 05/17/2020   Lab Results  Component Value Date   CHOL 123 03/25/2021   Lab Results  Component Value Date   HDL 47 03/25/2021   Lab Results  Component Value Date   LDLCALC 57 03/25/2021   Lab Results  Component Value Date   TRIG 100 03/25/2021   Lab Results  Component Value Date   CHOLHDL 2.6 03/25/2021   Lab Results  Component Value Date   HGBA1C 9.7 (H) 03/25/2021       Assessment & Plan:   Problem List Items Addressed This Visit       Cardiovascular and Mediastinum   Orthostatic hypotension - Primary    Continue to hold hydralazine.  Restart metoprolol xl 100 mg once daily.  Restart irbesartan 150 mg daily.   Continue torsemide 20 mg 2 twice a day.  Continue spironolactone 25 mg daily  Continue metolazone 5 mg once weekly   Check bp and pulse daily and keep a log.         No orders of the defined types were placed in this encounter.   Orders Placed This Encounter  Procedures   CBC with Differential/Platelet   Comprehensive metabolic panel     Follow-up: No follow-ups on file.  An After Visit Summary was printed and given to the  patient.  Rochel Brome, MD [facility] [phone]

## 2021-06-20 NOTE — Patient Instructions (Addendum)
Continue to hold hydralazine.  Restart metoprolol xl 100 mg once daily.  Restart irbesartan 150 mg daily.   Continue torsemide 20 mg 2 twice a day.  Continue spironolactone 25 mg daily  Continue metolazone 5 mg once weekly   Check bp and pulse daily and keep a log.

## 2021-06-21 ENCOUNTER — Telehealth: Payer: Self-pay

## 2021-06-21 DIAGNOSIS — I4891 Unspecified atrial fibrillation: Secondary | ICD-10-CM | POA: Diagnosis not present

## 2021-06-21 DIAGNOSIS — N1832 Chronic kidney disease, stage 3b: Secondary | ICD-10-CM | POA: Diagnosis not present

## 2021-06-21 DIAGNOSIS — E1122 Type 2 diabetes mellitus with diabetic chronic kidney disease: Secondary | ICD-10-CM | POA: Diagnosis not present

## 2021-06-21 DIAGNOSIS — N179 Acute kidney failure, unspecified: Secondary | ICD-10-CM | POA: Diagnosis not present

## 2021-06-21 DIAGNOSIS — I130 Hypertensive heart and chronic kidney disease with heart failure and stage 1 through stage 4 chronic kidney disease, or unspecified chronic kidney disease: Secondary | ICD-10-CM | POA: Diagnosis not present

## 2021-06-21 DIAGNOSIS — I5031 Acute diastolic (congestive) heart failure: Secondary | ICD-10-CM | POA: Diagnosis not present

## 2021-06-21 NOTE — Telephone Encounter (Signed)
Sara Hancock called this morning with her BP readings: BP is running 135/92  Heart rate is running 110-122. Patient has a question about her metoprolol. she has been taking 100 mg one tab bid and Dr. Tobie Poet wrote down taking one 100 mg tab daily and hold the hydralazine for now. She didn't know if Dr. Tobie Poet was aware that she was taking two tables of the metoprolol daily.    Dr. Tobie Poet notified.  Dr. Tobie Poet stated for the patient to increase the Metoprolol back up to one tablet bid and continue taking the irbesartan and STAY OFF the Hydralazine.  Patient notified.

## 2021-06-23 DIAGNOSIS — I951 Orthostatic hypotension: Secondary | ICD-10-CM | POA: Insufficient documentation

## 2021-06-23 NOTE — Assessment & Plan Note (Signed)
Continue to hold hydralazine.  Restart metoprolol xl 100 mg once daily.  Restart irbesartan 150 mg daily.   Continue torsemide 20 mg 2 twice a day.  Continue spironolactone 25 mg daily  Continue metolazone 5 mg once weekly   Check bp and pulse daily and keep a log.

## 2021-06-24 NOTE — Telephone Encounter (Signed)
Appt made

## 2021-07-01 ENCOUNTER — Other Ambulatory Visit: Payer: Self-pay

## 2021-07-01 MED ORDER — LEVEMIR FLEXTOUCH 100 UNIT/ML ~~LOC~~ SOPN: PEN_INJECTOR | SUBCUTANEOUS | 1 refills | Status: DC

## 2021-07-03 DIAGNOSIS — L720 Epidermal cyst: Secondary | ICD-10-CM | POA: Diagnosis not present

## 2021-07-03 DIAGNOSIS — L570 Actinic keratosis: Secondary | ICD-10-CM | POA: Diagnosis not present

## 2021-07-03 DIAGNOSIS — D485 Neoplasm of uncertain behavior of skin: Secondary | ICD-10-CM | POA: Diagnosis not present

## 2021-07-03 DIAGNOSIS — L308 Other specified dermatitis: Secondary | ICD-10-CM | POA: Diagnosis not present

## 2021-07-08 ENCOUNTER — Other Ambulatory Visit: Payer: Medicare Other

## 2021-07-08 DIAGNOSIS — E1122 Type 2 diabetes mellitus with diabetic chronic kidney disease: Secondary | ICD-10-CM | POA: Diagnosis not present

## 2021-07-08 DIAGNOSIS — I9589 Other hypotension: Secondary | ICD-10-CM | POA: Diagnosis not present

## 2021-07-08 DIAGNOSIS — E119 Type 2 diabetes mellitus without complications: Secondary | ICD-10-CM | POA: Diagnosis not present

## 2021-07-08 DIAGNOSIS — N184 Chronic kidney disease, stage 4 (severe): Secondary | ICD-10-CM | POA: Diagnosis not present

## 2021-07-09 DIAGNOSIS — E782 Mixed hyperlipidemia: Secondary | ICD-10-CM | POA: Diagnosis not present

## 2021-07-09 DIAGNOSIS — I5031 Acute diastolic (congestive) heart failure: Secondary | ICD-10-CM

## 2021-07-09 DIAGNOSIS — I480 Paroxysmal atrial fibrillation: Secondary | ICD-10-CM

## 2021-07-09 LAB — CBC WITH DIFFERENTIAL/PLATELET
Basophils Absolute: 0 10*3/uL (ref 0.0–0.2)
Basos: 0 %
EOS (ABSOLUTE): 0.1 10*3/uL (ref 0.0–0.4)
Eos: 1 %
Hematocrit: 33.2 % — ABNORMAL LOW (ref 34.0–46.6)
Hemoglobin: 11.3 g/dL (ref 11.1–15.9)
Immature Grans (Abs): 0 10*3/uL (ref 0.0–0.1)
Immature Granulocytes: 0 %
Lymphocytes Absolute: 1.7 10*3/uL (ref 0.7–3.1)
Lymphs: 23 %
MCH: 31.9 pg (ref 26.6–33.0)
MCHC: 34 g/dL (ref 31.5–35.7)
MCV: 94 fL (ref 79–97)
Monocytes Absolute: 0.6 10*3/uL (ref 0.1–0.9)
Monocytes: 9 %
Neutrophils Absolute: 4.9 10*3/uL (ref 1.4–7.0)
Neutrophils: 67 %
Platelets: 218 10*3/uL (ref 150–450)
RBC: 3.54 x10E6/uL — ABNORMAL LOW (ref 3.77–5.28)
RDW: 13.4 % (ref 11.7–15.4)
WBC: 7.3 10*3/uL (ref 3.4–10.8)

## 2021-07-09 LAB — COMPREHENSIVE METABOLIC PANEL
ALT: 16 IU/L (ref 0–32)
AST: 24 IU/L (ref 0–40)
Albumin/Globulin Ratio: 1.3 (ref 1.2–2.2)
Albumin: 3.9 g/dL (ref 3.6–4.6)
Alkaline Phosphatase: 148 IU/L — ABNORMAL HIGH (ref 44–121)
BUN/Creatinine Ratio: 36 — ABNORMAL HIGH (ref 12–28)
BUN: 85 mg/dL (ref 8–27)
Bilirubin Total: 0.4 mg/dL (ref 0.0–1.2)
CO2: 24 mmol/L (ref 20–29)
Calcium: 9.1 mg/dL (ref 8.7–10.3)
Chloride: 96 mmol/L (ref 96–106)
Creatinine, Ser: 2.36 mg/dL — ABNORMAL HIGH (ref 0.57–1.00)
Globulin, Total: 3.1 g/dL (ref 1.5–4.5)
Glucose: 107 mg/dL — ABNORMAL HIGH (ref 70–99)
Potassium: 4.5 mmol/L (ref 3.5–5.2)
Sodium: 137 mmol/L (ref 134–144)
Total Protein: 7 g/dL (ref 6.0–8.5)
eGFR: 20 mL/min/{1.73_m2} — ABNORMAL LOW (ref >59)

## 2021-07-10 DIAGNOSIS — T148XXA Other injury of unspecified body region, initial encounter: Secondary | ICD-10-CM | POA: Diagnosis not present

## 2021-07-11 ENCOUNTER — Ambulatory Visit (INDEPENDENT_AMBULATORY_CARE_PROVIDER_SITE_OTHER): Payer: Medicare Other | Admitting: Family Medicine

## 2021-07-11 ENCOUNTER — Other Ambulatory Visit: Payer: Self-pay

## 2021-07-11 VITALS — BP 110/50 | HR 60 | Temp 96.4°F | Resp 16 | Ht <= 58 in | Wt 150.0 lb

## 2021-07-11 DIAGNOSIS — N184 Chronic kidney disease, stage 4 (severe): Secondary | ICD-10-CM | POA: Diagnosis not present

## 2021-07-11 DIAGNOSIS — D6869 Other thrombophilia: Secondary | ICD-10-CM | POA: Diagnosis not present

## 2021-07-11 DIAGNOSIS — I480 Paroxysmal atrial fibrillation: Secondary | ICD-10-CM

## 2021-07-11 DIAGNOSIS — D631 Anemia in chronic kidney disease: Secondary | ICD-10-CM

## 2021-07-11 DIAGNOSIS — E1122 Type 2 diabetes mellitus with diabetic chronic kidney disease: Secondary | ICD-10-CM | POA: Diagnosis not present

## 2021-07-11 DIAGNOSIS — E782 Mixed hyperlipidemia: Secondary | ICD-10-CM | POA: Diagnosis not present

## 2021-07-11 DIAGNOSIS — Z794 Long term (current) use of insulin: Secondary | ICD-10-CM

## 2021-07-11 DIAGNOSIS — M810 Age-related osteoporosis without current pathological fracture: Secondary | ICD-10-CM | POA: Diagnosis not present

## 2021-07-11 DIAGNOSIS — K219 Gastro-esophageal reflux disease without esophagitis: Secondary | ICD-10-CM

## 2021-07-11 DIAGNOSIS — K743 Primary biliary cirrhosis: Secondary | ICD-10-CM

## 2021-07-11 DIAGNOSIS — I131 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, with stage 1 through stage 4 chronic kidney disease, or unspecified chronic kidney disease: Secondary | ICD-10-CM | POA: Diagnosis not present

## 2021-07-11 NOTE — Progress Notes (Unsigned)
Subjective:  Patient ID: Sara Hancock, female    DOB: 03-26-1935  Age: 87 y.o. MRN: [id number]  Chief Complaint  Patient presents with   Diabetes    HPI  Diabetes:  Complications: Glucose checking: twice daily.  Glucose logs: 73-140 am, 147 - 227.   Hypoglycemia: no Most recent A1C: Current medications: Levemir 38 units every AM and 22 every PM.  Last Eye Exam: Foot checks:  Hyperlipidemia: Current medications:  Zocor 40 mg daily.    Hypertension:   Complications: BP 409-735/ 75-92, pulse 70-80s. Current medications:  Lopressor 100 mg twice daily, Avapro 150 mg once daily, spironolactone   Primary biliary cirrhosis: on ursodiol.   GERD: on pantoprazole 40 mg once daily.  Diet: healthy. 3 meals per day.  Exercise:  Current Outpatient Medications on File Prior to Visit  Medication Sig Dispense Refill   ACCU-CHEK AVIVA PLUS test strip USE TO TEST TWICE DAILY 200 strip 3   apixaban (ELIQUIS) 2.5 MG TABS tablet Take 1 tablet (2.5 mg total) by mouth 2 (two) times daily. 180 tablet 1   calcium-vitamin D (OSCAL WITH D) 250-125 MG-UNIT per tablet Take 1 tablet by mouth 2 (two) times daily.     Cholecalciferol (VITAMIN D3) 1000 units CAPS Take 1,000 Units by mouth daily.     fexofenadine (ALLEGRA ALLERGY) 60 MG tablet Take 1 tablet (60 mg total) by mouth 2 (two) times daily. (Patient taking differently: Take 60 mg by mouth daily.) 180 tablet 1   fluticasone (FLONASE) 50 MCG/ACT nasal spray Place 1 spray into both nostrils as needed.      folic acid (FOLVITE) 329 MCG tablet Take 400 mcg by mouth daily.     insulin detemir (LEVEMIR FLEXTOUCH) 100 UNIT/ML FlexPen INJECT 38 UNITS UNDER THE SKIN EVERY MORNING AND 22 UNITS WITH SUPPER 54 mL 1   Insulin Pen Needle (BD PEN NEEDLE NANO 2ND GEN) 32G X 4 MM MISC Use a new needle each time when checking FBS 200 each 3   irbesartan (AVAPRO) 150 MG tablet Take 150 mg by mouth daily.     metolazone (ZAROXOLYN) 5 MG tablet Take 1 tablet  (5 mg total) by mouth once a week. 4 tablet 6   metoprolol tartrate (LOPRESSOR) 100 MG tablet TAKE 1 TABLET(100 MG) BY MOUTH TWICE DAILY 180 tablet 3   Multiple Vitamin (MULTIVITAMIN WITH MINERALS) TABS Take 1 tablet by mouth daily.     nystatin cream (MYCOSTATIN) APPLY SUFFICIENT AMOUNT EXTERNALLY TO THE AFFECTED AREA TWICE DAILY 30 g 1   pantoprazole (PROTONIX) 40 MG tablet TAKE 1 TABLET BY MOUTH EVERY DAY 90 tablet 3   simvastatin (ZOCOR) 40 MG tablet TAKE 1 TABLET BY MOUTH AT BEDTIME 90 tablet 1   spironolactone (ALDACTONE) 25 MG tablet Take 25 mg by mouth daily.     torsemide (DEMADEX) 20 MG tablet TAKE 2 TABLETS BY MOUTH TWICE DAILY (Patient taking differently: 20 mg daily.) 360 tablet 0   ursodiol (ACTIGALL) 300 MG capsule TAKE 1 CAPSULE(300 MG) BY MOUTH TWICE DAILY 180 capsule 0   vitamin C (ASCORBIC ACID) 500 MG tablet Take 500 mg by mouth daily.     vitamin E 400 UNIT capsule Take 400 Units by mouth daily.     Accu-Chek FastClix Lancets MISC USE TO CHECK BLOOD SUGAR TWICE DAILY 204 each 3   No current facility-administered medications on file prior to visit.   Past Medical History:  Diagnosis Date   Acquired thrombophilia ([place]) 09/11/2019   Allergic  rhinitis 09/17/2015   Anemia    ANEMIA, NORMOCYTIC 10/27/2007   Qualifier: Diagnosis of  By: Nils Pyle CMA (AAMA), Leisha     Anticoagulant long-term use 01/08/2011   Arthritis    Benign neoplasm of colon 01/08/2011   Biliary cirrhosis ([place])    BRADYCARDIA 11/15/2008   Qualifier: Diagnosis of  By: Hollie Salk CMA, Amanda     Cellulitis of left foot 09/25/2016   Cholelithiasis 01/09/12   Chronic kidney disease, stage 3 (HCC)    Combined form of age-related cataract, both eyes 02/26/2017   Diabetes mellitus type 2 without retinopathy ([place]) 10/27/2013   Diverticulosis of colon with hemorrhage 06/08/2000   Dyslipidemia 09/17/2015   Encounter for therapeutic drug monitoring 06/09/2013   Esophageal reflux 01/08/2011   Esophageal stenosis 01/08/11    Esophagitis 01/08/11   GALLSTONES 10/26/2008   Qualifier: Diagnosis of  By: Sharlett Iles MD Byrd Hesselbach    Gastritis, erosive chronic 01/08/2011   GERD (gastroesophageal reflux disease)    Glaucoma suspect of both eyes 02/26/2017   Glaucoma suspect of both eyes 02/26/2017   Hiatal hernia 01/08/11   History of IBS 10/27/2007   Qualifier: Diagnosis of  By: Nils Pyle CMA (AAMA), Mearl Latin     HTN (hypertension)    Hx of adenomatous colonic polyps 01/08/11   Hyperplastic colon polyp 06/08/2000, 07/31/2003, 01/08/11   Hypertension, essential 10/27/2007   Qualifier: Diagnosis of  By: Nils Pyle CMA Deborra Medina), Mearl Latin     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, with stage 1 through stage 4 chronic kidney disease, or unspecified chronic kidney disease    Iron deficiency anemia 01/08/2011   Left foot pain 09/25/2016   Localized edema    Long term (current) use of insulin (HCC)    Mixed hyperlipidemia    Nuclear cataract 07/03/2011   Obesity    Obesity (BMI 30-39.9) 03/22/2012   Osteoarthritis of right thumb 09/17/2015   Osteopenia    Other iron deficiency anemias    Other specified menopausal and perimenopausal disorders    PAF (paroxysmal atrial fibrillation) (HCC)    occurring in the setting of bradycardia and mild first degree AV block followed by Dr. Jens Som   Pain in thoracic spine    Paraesophageal hiatal hernia, 3cm by EGD 03/22/2012   Pedal edema 12/13/2019   Persistent atrial fibrillation ([place]) 08/28/2019   Posterior vitreous detachment, bilateral 01/10/2016   Primary biliary cholangitis ([place]) 10/27/2007   Qualifier: Diagnosis of  By: Nils Pyle CMA (AAMA), Mearl Latin     Pure hypercholesterolemia    Retinal tear, right 07/03/2011   Shortness of breath 12/13/2019   Stage 3b chronic kidney disease ([place]) 09/11/2019   Type II or unspecified type diabetes mellitus without mention of complication, not stated as uncontrolled    Uterine fibroid 01/09/12   Past Surgical History:  Procedure Laterality  Date   BREAST CYST EXCISION     left   CARDIOVERSION N/A 11/27/2017   Procedure: CARDIOVERSION;  Surgeon: Sanda Klein, MD;  Location: [facility];  Service: Cardiovascular;  Laterality: N/A;   COLONOSCOPY     INSERTION OF MESH  05/18/2012   Procedure: INSERTION OF MESH;  Surgeon: Adin Hector, MD;  Location: [facility];  Service: General;  Laterality: N/A;   LIVER BIOPSY  05/18/2012   Procedure: LIVER BIOPSY;  Surgeon: Adin Hector, MD;  Location: [facility];  Service: General;;   POLYPECTOMY     THYROIDECTOMY, PARTIAL  1980   [place]  REPAIR  05/18/2012   Procedure: LAPAROSCOPIC VENTRAL HERNIA;  Surgeon: Adin Hector, MD;  Location: [facility];  Service: General;  Laterality: N/A;  Laparoscopic Ventral Wall Hernia with Mesh    Family History  Problem Relation Age of Onset   Heart attack Father    Stroke Mother    Diabetes Sister        x 2   Aneurysm Sister    Stroke Sister    Congestive Heart Failure Sister    Diabetes Brother    Kidney failure Other    Hyperlipidemia Other    Arrhythmia Other    Congestive Heart Failure Other    Hypertension Other    Arthritis Other    Colon cancer Neg Hx    Esophageal cancer Neg Hx    Stomach cancer Neg Hx    Rectal cancer Neg Hx    Social History   Socioeconomic History   Marital status: Married    Spouse name: Not on file   Number of children: 2   Years of education: Not on file   Highest education level: Not on file  Occupational History   Occupation: retired    Fish farm manager: RETIRED  Tobacco Use   Smoking status: Never   Smokeless tobacco: Never  Vaping Use   Vaping Use: Never used  Substance and Sexual Activity   Alcohol use: No   Drug use: No   Sexual activity: Not Currently  Other Topics Concern   Not on file  Social History Narrative   Not on file   Social Determinants of Health   Financial Resource Strain: Low Risk    Difficulty of Paying Living Expenses: Not hard at all  Food  Insecurity: Not on file  Transportation Needs: No Transportation Needs   Lack of Transportation (Medical): No   Lack of Transportation (Non-Medical): No  Physical Activity: Not on file  Stress: Not on file  Social Connections: Not on file    Review of Systems  Constitutional:  Negative for chills, fatigue and fever.  HENT:  Negative for congestion, rhinorrhea and sore throat.   Respiratory:  Negative for cough and shortness of breath.   Cardiovascular:  Negative for chest pain, palpitations and leg swelling.  Gastrointestinal:  Negative for abdominal pain, constipation, diarrhea, nausea and vomiting.  Endocrine: Negative for polydipsia and polyphagia.  Genitourinary:  Negative for dysuria and urgency.  Musculoskeletal:  Negative for back pain and myalgias.  Neurological:  Negative for dizziness, weakness, light-headedness and headaches.  Psychiatric/Behavioral:  Negative for dysphoric mood. The patient is not nervous/anxious.     Objective:  BP (!) 110/50    Pulse 60    Temp (!) 96.4 F (35.8 C)    Resp 16    Ht 4\' 10"  (1.473 m)    Wt 150 lb (68 kg)    BMI 31.35 kg/m   BP/Weight 07/11/2021 06/20/2021 8/50/2774  Systolic BP 128 786 767  Diastolic BP 50 76 80  Wt. (Lbs) 150 154 154.2  BMI 31.35 28.17 28.2    Physical Exam  Diabetic Foot Exam - Simple   No data filed      Lab Results  Component Value Date   WBC 7.3 07/08/2021   HGB 11.3 07/08/2021   HCT 33.2 (L) 07/08/2021   PLT 218 07/08/2021   GLUCOSE 107 (H) 07/08/2021   CHOL 123 03/25/2021   TRIG 100 03/25/2021   HDL 47 03/25/2021   LDLCALC 57 03/25/2021   ALT 16 07/08/2021  AST 24 07/08/2021   NA 137 07/08/2021   K 4.5 07/08/2021   CL 96 07/08/2021   CREATININE 2.36 (H) 07/08/2021   BUN 85 (HH) 07/08/2021   CO2 24 07/08/2021   TSH 2.020 09/10/2020   INR 2.7 08/14/2020   HGBA1C 9.7 (H) 03/25/2021      Assessment & Plan:   Problem List Items Addressed This Visit   None .  No orders of the defined  types were placed in this encounter.   No orders of the defined types were placed in this encounter.    Follow-up: No follow-ups on file.  An After Visit Summary was printed and given to the patient.  Rochel Brome, MD [facility] [phone]

## 2021-07-11 NOTE — Patient Instructions (Signed)
Levemir: ?Increase am dose to 44 U. ?Continue pm dose at 22 U.  ? ? ? ? ?

## 2021-07-13 LAB — SPECIMEN STATUS REPORT

## 2021-07-13 LAB — HGB A1C W/O EAG: Hgb A1c MFr Bld: 8 % — ABNORMAL HIGH (ref 4.8–5.6)

## 2021-07-13 LAB — CARDIOVASCULAR RISK ASSESSMENT

## 2021-07-13 LAB — LIPID PANEL W/O CHOL/HDL RATIO
Cholesterol, Total: 141 mg/dL (ref 100–199)
HDL: 49 mg/dL (ref >39)
LDL Chol Calc (NIH): 73 mg/dL (ref 0–99)
Triglycerides: 105 mg/dL (ref 0–149)
VLDL Cholesterol Cal: 19 mg/dL (ref 5–40)

## 2021-07-16 ENCOUNTER — Ambulatory Visit (INDEPENDENT_AMBULATORY_CARE_PROVIDER_SITE_OTHER): Payer: Medicare Other

## 2021-07-16 DIAGNOSIS — K921 Melena: Secondary | ICD-10-CM

## 2021-07-16 DIAGNOSIS — N184 Chronic kidney disease, stage 4 (severe): Secondary | ICD-10-CM | POA: Diagnosis not present

## 2021-07-16 DIAGNOSIS — D631 Anemia in chronic kidney disease: Secondary | ICD-10-CM | POA: Diagnosis not present

## 2021-07-17 ENCOUNTER — Other Ambulatory Visit: Payer: Self-pay | Admitting: Family Medicine

## 2021-07-17 LAB — POC HEMOCCULT BLD/STL (HOME/3-CARD/SCREEN)
Card #2 Fecal Occult Blod, POC: POSITIVE
Card #3 Fecal Occult Blood, POC: POSITIVE
Fecal Occult Blood, POC: NEGATIVE

## 2021-07-17 NOTE — Progress Notes (Signed)
Sara Hancock brings hemoccult cards in for development.   ?

## 2021-07-17 NOTE — Patient Instructions (Signed)
Dr. Tobie Poet advised follow-up with GI for heme positive stools.  ?Patient to call to schedule appointment.   ?

## 2021-07-18 ENCOUNTER — Telehealth: Payer: Self-pay

## 2021-07-18 NOTE — Chronic Care Management (AMB) (Signed)
? ? ?Chronic Care Management ?Pharmacy Assistant  ? ?Name: Sara Hancock  MRN: [id number] DOB: 27-Feb-1935 ? ? ?Reason for Encounter: Disease State call for DM  ?  ?Recent office visits:  ?07/16/21 Meredith Mody RN. Seen for Anemia due to stage 4 CKD. D/C Hydralazine '100mg'$ . ? ?07/11/21 Rochel Brome MD. Seen for DM. Levemir:-Increase am dose to 44 U. ? ?06/20/21 [facility], Kirsten MD. Seen for Hyptotension. Restart metoprolol xl 100 mg once daily. Restart irbesartan 150 mg daily.  ?  ?Recent consult visits:  ?06/20/21 [facility]) Gerarda Fraction MD. Seen for DM Eye Exam. D/C Janumet 50-'1000mg'$ .  ? ?06/19/21 [facility]) Glegengack, Rodman Key MD. Seen for procedure. D/C Ketorolac 0.5%, Vigamox 0.5% and Prednisolone 1%.  ? ?Hospital visits:  ?None ? ?Medications: ?Outpatient Encounter Medications as of 07/18/2021  ?Medication Sig  ? ACCU-CHEK AVIVA PLUS test strip USE TO TEST TWICE DAILY  ? Accu-Chek FastClix Lancets MISC USE TO CHECK BLOOD SUGAR TWICE DAILY  ? apixaban (ELIQUIS) 2.5 MG TABS tablet Take 1 tablet (2.5 mg total) by mouth 2 (two) times daily.  ? calcium-vitamin D (OSCAL WITH D) 250-125 MG-UNIT per tablet Take 1 tablet by mouth 2 (two) times daily.  ? Cholecalciferol (VITAMIN D3) 1000 units CAPS Take 1,000 Units by mouth daily.  ? fexofenadine (ALLEGRA ALLERGY) 60 MG tablet Take 1 tablet (60 mg total) by mouth 2 (two) times daily. (Patient taking differently: Take 60 mg by mouth daily.)  ? fluticasone (FLONASE) 50 MCG/ACT nasal spray Place 1 spray into both nostrils as needed.   ? folic acid (FOLVITE) 025 MCG tablet Take 400 mcg by mouth daily.  ? insulin detemir (LEVEMIR FLEXTOUCH) 100 UNIT/ML FlexPen INJECT 38 UNITS UNDER THE SKIN EVERY MORNING AND 22 UNITS WITH SUPPER  ? Insulin Pen Needle (BD PEN NEEDLE NANO 2ND GEN) 32G X 4 MM MISC USE A NEEDLE EACH TIME WHEN CHECKING FBS  ? irbesartan (AVAPRO) 150 MG tablet Take 150 mg by mouth daily.  ? metolazone (ZAROXOLYN) 5 MG tablet Take 1 tablet (5 mg total) by mouth once a  week.  ? metoprolol tartrate (LOPRESSOR) 100 MG tablet TAKE 1 TABLET(100 MG) BY MOUTH TWICE DAILY  ? Multiple Vitamin (MULTIVITAMIN WITH MINERALS) TABS Take 1 tablet by mouth daily.  ? nystatin cream (MYCOSTATIN) APPLY SUFFICIENT AMOUNT EXTERNALLY TO THE AFFECTED AREA TWICE DAILY  ? pantoprazole (PROTONIX) 40 MG tablet TAKE 1 TABLET BY MOUTH EVERY DAY  ? simvastatin (ZOCOR) 40 MG tablet TAKE 1 TABLET BY MOUTH AT BEDTIME  ? spironolactone (ALDACTONE) 25 MG tablet Take 25 mg by mouth daily.  ? torsemide (DEMADEX) 20 MG tablet TAKE 2 TABLETS BY MOUTH TWICE DAILY (Patient taking differently: 20 mg daily.)  ? ursodiol (ACTIGALL) 300 MG capsule TAKE 1 CAPSULE(300 MG) BY MOUTH TWICE DAILY  ? vitamin C (ASCORBIC ACID) 500 MG tablet Take 500 mg by mouth daily.  ? vitamin E 400 UNIT capsule Take 400 Units by mouth daily.  ? ?No facility-administered encounter medications on file as of 07/18/2021.  ? ? ?Recent Relevant Labs: ?Lab Results  ?Component Value Date/Time  ? HGBA1C 8.0 (H) 07/08/2021 09:44 AM  ? HGBA1C 9.7 (H) 03/25/2021 02:09 PM  ?  ?Kidney Function ?Lab Results  ?Component Value Date/Time  ? CREATININE 2.36 (H) 07/08/2021 09:44 AM  ? CREATININE 2.40 (H) 06/10/2021 04:07 PM  ? GFR 25.57 (L) 05/17/2020 12:06 PM  ? GFRNONAA 25 (L) 06/26/2020 11:19 AM  ? GFRAA 29 (L) 06/26/2020 11:19 AM  ? ? ? ?  Current antihyperglycemic regimen:  ?Levemir Flextouch 100 units- 44 units every AM and 22 every PM.  ?Patient verbally confirms she is taking the above medications as directed. Yes ? ?What recent interventions/DTPs have been made to improve glycemic control:  ?Pt stated her insulin was increased to 44 units in am  ? ?Have there been any recent hospitalizations or ED visits since last visit with CPP? No ? ?Patient denies hypoglycemic symptoms, including None ? ?Patient denies hyperglycemic symptoms, including none ? ?How often are you checking your blood sugar? twice daily ? ?What are your blood sugars ranging?  ?Fasting: 07/14/21  82am, 163pm. 07/15/21 125 am,162 pm. 07/16/21 116 am, 85 pm  ? ?Blood Pressure Reading ?07/14/21 110/75 76 pulse ?07/15/21 119/84 80 pulse ?07/17/21  109/84 85 pulse ? ?Weight: 148.5 ? ?On insulin? Yes ?How many units:44 in am and 22 in pm ? ?During the week, how often does your blood glucose drop below 70? Never ? ?Are you checking your feet daily/regularly? Yes ? ?Adherence Review: ?Is the patient currently on a STATIN medication? Yes ?Is the patient currently on ACE/ARB medication? Yes ?Does the patient have >5 day gap between last estimated fill dates? CPP to review ? ?Care Gaps: ?Last eye exam / Retinopathy Screening? 04/26/20 ?Last Annual Wellness Visit? None noted  ?Last Diabetic Foot Exam? 09/12/20 ? ?Star Rating Drugs:  ?Medication:  Last Fill: Day Supply ?None noted  ? ?Elray Mcgregor, CMA ?Clinical Pharmacist Assistant  ?[phone]  ?

## 2021-07-23 ENCOUNTER — Telehealth: Payer: Self-pay

## 2021-07-23 NOTE — Telephone Encounter (Signed)
Patient is aware that she needs her [place] & lab work prior to her appointment with Dr. Fuller Plan. No further questions. ?

## 2021-07-23 NOTE — Telephone Encounter (Signed)
-----   Message from Marlon Pel, RN sent at 07/22/2021  4:45 PM EDT ----- ? ?----- Message ----- ?From: Marlon Pel, RN ?Sent: 07/22/2021  12:00 AM EDT ?To: Marlon Pel, RN ? ?Needs labs and [place] see results 9/13/ stark ? ? ?

## 2021-07-23 NOTE — Telephone Encounter (Signed)
Left message for patient to call back. Labs & [place] orders previously placed. Schedulers have been notified.  ?

## 2021-07-24 NOTE — Telephone Encounter (Signed)
Sugars are elevated and low sometimes. Could be candidate for Short Term Insulin to smooth out oscillation  ?

## 2021-07-28 NOTE — Assessment & Plan Note (Addendum)
The current medical regimen is effective;  continue present plan and medications. Continue ursodiol. Follow up with Dr. Fuller Plan. ?

## 2021-07-29 ENCOUNTER — Other Ambulatory Visit: Payer: Self-pay

## 2021-07-29 MED ORDER — ACCU-CHEK GUIDE VI STRP: ORAL_STRIP | 3 refills | Status: DC

## 2021-07-30 ENCOUNTER — Other Ambulatory Visit: Payer: Self-pay

## 2021-07-30 ENCOUNTER — Other Ambulatory Visit (INDEPENDENT_AMBULATORY_CARE_PROVIDER_SITE_OTHER): Payer: Medicare Other

## 2021-07-30 ENCOUNTER — Ambulatory Visit (HOSPITAL_COMMUNITY)
Admission: RE | Admit: 2021-07-30 | Discharge: 2021-07-30 | Disposition: A | Discharge status: Home / Self Care | Payer: Medicare Other | Source: Ambulatory Visit | Attending: Gastroenterology | Admitting: Gastroenterology

## 2021-07-30 DIAGNOSIS — R932 Abnormal findings on diagnostic imaging of liver and biliary tract: Secondary | ICD-10-CM | POA: Diagnosis not present

## 2021-07-30 DIAGNOSIS — D509 Iron deficiency anemia, unspecified: Secondary | ICD-10-CM | POA: Insufficient documentation

## 2021-07-30 DIAGNOSIS — K745 Biliary cirrhosis, unspecified: Secondary | ICD-10-CM | POA: Insufficient documentation

## 2021-07-30 DIAGNOSIS — K2100 Gastro-esophageal reflux disease with esophagitis, without bleeding: Secondary | ICD-10-CM | POA: Diagnosis not present

## 2021-07-30 DIAGNOSIS — K7460 Unspecified cirrhosis of liver: Secondary | ICD-10-CM | POA: Diagnosis not present

## 2021-07-30 LAB — PROTIME-INR
INR: 1.1 ratio — ABNORMAL HIGH (ref 0.8–1.0)
Prothrombin Time: 12.1 s (ref 9.6–13.1)

## 2021-07-30 LAB — COMPREHENSIVE METABOLIC PANEL
ALT: 19 U/L (ref 0–35)
AST: 25 U/L (ref 0–37)
Albumin: 3.9 g/dL (ref 3.5–5.2)
Alkaline Phosphatase: 135 U/L — ABNORMAL HIGH (ref 39–117)
BUN: 110 mg/dL (ref 6–23)
CO2: 27 mEq/L (ref 19–32)
Calcium: 9.6 mg/dL (ref 8.4–10.5)
Chloride: 97 mEq/L (ref 96–112)
Creatinine, Ser: 2.73 mg/dL — ABNORMAL HIGH (ref 0.40–1.20)
GFR: 15.48 mL/min — ABNORMAL LOW (ref >60.00)
Glucose, Bld: 99 mg/dL (ref 70–99)
Potassium: 4.2 mEq/L (ref 3.5–5.1)
Sodium: 136 mEq/L (ref 135–145)
Total Bilirubin: 0.5 mg/dL (ref 0.2–1.2)
Total Protein: 7.7 g/dL (ref 6.0–8.3)

## 2021-07-30 LAB — CBC
HCT: 33.1 % — ABNORMAL LOW (ref 36.0–46.0)
Hemoglobin: 11.1 g/dL — ABNORMAL LOW (ref 12.0–15.0)
MCHC: 33.7 g/dL (ref 30.0–36.0)
MCV: 95.2 fl (ref 78.0–100.0)
Platelets: 198 10*3/uL (ref 150.0–400.0)
RBC: 3.48 Mil/uL — ABNORMAL LOW (ref 3.87–5.11)
RDW: 14.4 % (ref 11.5–15.5)
WBC: 8.1 10*3/uL (ref 4.0–10.5)

## 2021-07-31 LAB — AFP TUMOR MARKER: AFP-Tumor Marker: 1.5 ng/mL

## 2021-08-01 ENCOUNTER — Other Ambulatory Visit: Payer: Self-pay

## 2021-08-01 DIAGNOSIS — K745 Biliary cirrhosis, unspecified: Secondary | ICD-10-CM

## 2021-08-01 DIAGNOSIS — R932 Abnormal findings on diagnostic imaging of liver and biliary tract: Secondary | ICD-10-CM

## 2021-08-01 DIAGNOSIS — K2100 Gastro-esophageal reflux disease with esophagitis, without bleeding: Secondary | ICD-10-CM

## 2021-08-01 DIAGNOSIS — D509 Iron deficiency anemia, unspecified: Secondary | ICD-10-CM

## 2021-08-06 ENCOUNTER — Telehealth: Payer: Self-pay

## 2021-08-06 ENCOUNTER — Encounter: Payer: Self-pay | Admitting: Gastroenterology

## 2021-08-06 ENCOUNTER — Ambulatory Visit (INDEPENDENT_AMBULATORY_CARE_PROVIDER_SITE_OTHER): Payer: Medicare Other | Admitting: Gastroenterology

## 2021-08-06 VITALS — BP 108/78 | HR 80 | Ht 61.0 in | Wt 150.6 lb

## 2021-08-06 DIAGNOSIS — Z7901 Long term (current) use of anticoagulants: Secondary | ICD-10-CM | POA: Diagnosis not present

## 2021-08-06 DIAGNOSIS — R195 Other fecal abnormalities: Secondary | ICD-10-CM | POA: Diagnosis not present

## 2021-08-06 DIAGNOSIS — K219 Gastro-esophageal reflux disease without esophagitis: Secondary | ICD-10-CM | POA: Diagnosis not present

## 2021-08-06 DIAGNOSIS — K743 Primary biliary cirrhosis: Secondary | ICD-10-CM

## 2021-08-06 MED ORDER — PLENVU 140 G PO SOLR: 1.0000 | Freq: Once | ORAL | 0 refills | Status: AC

## 2021-08-06 NOTE — Progress Notes (Signed)
? ? ?  History of Present Illness: This is an 87 year old female referred for evaluation of occult blood in stool.  She has primary biliary cholangitis with cirrhosis.  She has intermittent, infrequent small-volume bright red blood on the tissue paper felt secondary to hemorrhoids for many years and this pattern has not changed.  She has a history of colon polyps and last underwent colonoscopy in November 2019.  There were no plans for future surveillance colonoscopies due to her age and comorbidities.  Recent evaluation with her liver disease showed stable parameters including ultrasound and blood work.  She is maintained on Eliquis for PAF and is followed by Dr. Harriet Masson. ? ? ?Current Medications, Allergies, Past Medical History, Past Surgical History, Family History and Social History were reviewed in [employer] record. ? ? ?Physical Exam: ?General: Well developed, well nourished, no acute distress ?Head: Normocephalic and atraumatic ?Eyes: Sclerae anicteric, EOMI ?Ears: Normal auditory acuity ?Mouth: Not examined, mask on during Covid-19 pandemic ?Lungs: Clear throughout to auscultation ?Heart: Regular rate and rhythm; no murmurs, rubs or bruits ?Abdomen: Soft, non tender and non distended. No masses, hepatosplenomegaly or hernias noted. Normal Bowel sounds ?Rectal: Deferred to colonoscopy ?Musculoskeletal: Symmetrical with no gross deformities  ?Pulses:  Normal pulses noted ?Extremities: No clubbing, cyanosis, edema or deformities noted ?Neurological: Alert oriented x 4, grossly nonfocal ?Psychological:  Alert and cooperative. Normal mood and affect ? ? ?Assessment and Recommendations: ? ?Occult blood in stool. Anemia likely secondary to CKD4. History of adenomatous and sessile serrated colon polyps.  We discussed further evaluation with colonoscopy and EGD.  She is at higher risk for endoscopic procedure and anesthesia related complications.  She would like to proceed with colonoscopy and  EGD. The risks (including bleeding, perforation, infection, missed lesions, medication reactions and possible hospitalization or surgery if complications occur), benefits, and alternatives to colonoscopy with possible biopsy and possible polypectomy were discussed with the patient and they consent to proceed.  The risks (including bleeding, perforation, infection, missed lesions, medication reactions and possible hospitalization or surgery if complications occur), benefits, and alternatives to endoscopy with possible biopsy and possible dilation were discussed with the patient and they consent to proceed.   ?PBC stable.  Blood work and RUQ [place] in 6 months as planned.  EGD to evaluate for varices, portal gastropathy.   ?GERD with a history of an esophageal stricture and erosive gastritis.  Continue pantoprazole 40 mg p.o. daily.  Further evaluation with EGD as above. ?CKD4 ?Pulm hypertension ?PAF, EF=55-60%. Hold Eliquis 2 days before procedure - will instruct when and how to resume after procedure. Low but real risk of cardiovascular event such as heart attack, stroke, embolism, thrombosis or ischemia/infarct of other organs off [place] explained and need to seek urgent help if this occurs. The patient consents to proceed. Will communicate by phone or EMR with patient's prescribing provider to confirm that holding Eliquis is reasonable in this case.   ?

## 2021-08-06 NOTE — Telephone Encounter (Signed)
Will route to pharm for input on anticoag. Last OV 05/2021. ?

## 2021-08-06 NOTE — Telephone Encounter (Signed)
[facility] Pre-operative Risk Assessment  ?   ?Request for surgical clearance:     Endoscopy Procedure ? ?What type of surgery is being performed?     Endo/Colon ? ?When is this surgery scheduled?     09/11/21 ? ?What type of clearance is required ?   Pharmacy ? ?Are there any medications that need to be held prior to surgery and how long? Eliquis x 2 days ? ?Practice name and name of physician performing surgery?      [facility] ? ?What is your office phone and fax number?      Phone- [phone]  Fax- [phone] ? ?Anesthesia type (None, local, MAC, general) ?       MAC  ?

## 2021-08-06 NOTE — Patient Instructions (Signed)
You have been scheduled for an endoscopy and colonoscopy. Please follow the written instructions given to you at your visit today. Please pick up your prep supplies at the pharmacy within the next 1-3 days. If you use inhalers (even only as needed), please bring them with you on the day of your procedure.  The [facility] providers would like to encourage you to use MYCHART to communicate with providers for non-urgent requests or questions.  Due to long hold times on the telephone, sending your provider a message by MYCHART may be a faster and more efficient way to get a response.  Please allow 48 business hours for a response.  Please remember that this is for non-urgent requests.   Due to recent changes in healthcare laws, you may see the results of your imaging and laboratory studies on MyChart before your provider has had a chance to review them.  We understand that in some cases there may be results that are confusing or concerning to you. Not all laboratory results come back in the same time frame and the provider may be waiting for multiple results in order to interpret others.  Please give us 48 hours in order for your provider to thoroughly review all the results before contacting the office for clarification of your results.   Thank you for choosing me and [facility].  Malcolm T. Stark, Jr., MD., FACG  

## 2021-08-07 NOTE — Telephone Encounter (Signed)
Informed patient she can hold Eliquis 2 days prior to her procedures per Cardiology. Patient verbalized understanding. ?

## 2021-08-07 NOTE — Telephone Encounter (Signed)
Patient with diagnosis of afib on Eliquis for anticoagulation.   ? ?Procedure: endoscopy/colonoscopy ?Date of procedure: 09/11/21 ? ?CHA2DS2-VASc Score = 6  ?This indicates a 9.7% annual risk of stroke. ?The patient's score is based upon: ?CHF History: 1 ?HTN History: 1 ?Diabetes History: 1 ?Stroke History: 0 ?Vascular Disease History: 0 ?Age Score: 2 ?Gender Score: 1 ?  ?CrCl 68m/min ?Platelet count 198K ? ?Per office protocol, patient can hold Eliquis for 2-3 days prior to procedure.   ?

## 2021-08-07 NOTE — Telephone Encounter (Signed)
Left message for patient to return my call.

## 2021-08-12 ENCOUNTER — Ambulatory Visit (INDEPENDENT_AMBULATORY_CARE_PROVIDER_SITE_OTHER): Payer: Medicare Other

## 2021-08-12 DIAGNOSIS — E782 Mixed hyperlipidemia: Secondary | ICD-10-CM

## 2021-08-12 DIAGNOSIS — E1122 Type 2 diabetes mellitus with diabetic chronic kidney disease: Secondary | ICD-10-CM

## 2021-08-12 NOTE — Patient Instructions (Signed)
Visit Information ? ? Goals Addressed   ?None ?  ? ?Patient Care Plan: [place]  ?  ? ?Problem Identified: DM, HTN, CKD, AFib   ?Priority: High  ?Onset Date: 06/17/2021  ?  ? ?Long-Range Goal: Disease State Management   ?Start Date: 06/17/2021  ?Expected End Date: 06/17/2022  ?Recent Progress: On track  ?Priority: High  ?Note:   ?Current Barriers:  ?Does not maintain contact with provider office ?Does not contact provider office for questions/concerns ? ?Pharmacist Clinical Goal(s):  ?Patient will achieve adherence to monitoring guidelines and medication adherence to achieve therapeutic efficacy through collaboration with PharmD and provider.  ? ?Interventions: ?1:1 collaboration with Cox, Kirsten, MD regarding development and update of comprehensive plan of care as evidenced by provider attestation and co-signature ?Inter-disciplinary care team collaboration (see longitudinal plan of care) ?Comprehensive medication review performed; medication list updated in electronic medical record ? ? ? ?Hyperlipidemia: (LDL goal < 70) ?The ASCVD Risk score (Arnett DK, et al., 2019) failed to calculate for the following reasons: ?  The 2019 ASCVD risk score is only valid for ages 22 to 42 ?Lab Results  ?Component Value Date  ? CHOL 141 07/08/2021  ? CHOL 123 03/25/2021  ? CHOL 111 12/17/2020  ? ?Lab Results  ?Component Value Date  ? HDL 49 07/08/2021  ? HDL 47 03/25/2021  ? HDL 46 12/17/2020  ? ?Lab Results  ?Component Value Date  ? [place] 73 07/08/2021  ? [place] 57 03/25/2021  ? [place] 50 12/17/2020  ? ?Lab Results  ?Component Value Date  ? TRIG 105 07/08/2021  ? TRIG 100 03/25/2021  ? TRIG 73 12/17/2020  ? ?Lab Results  ?Component Value Date  ? CHOLHDL 2.6 03/25/2021  ? CHOLHDL 2.4 12/17/2020  ? CHOLHDL 2.4 09/10/2020  ?No results found for: LDLDIRECT ?-Controlled ?-Current treatment: ?Simvastatin 2m Query Appropriate,  ?-Medications previously tried: None  ?-Current dietary patterns: "Tries to eat  healthy" ?-Current exercise habits: None ?-Educated on Cholesterol goals;  ?Feb 2023: Simvastatin package insert says, "Renal Dosing: CrCl 15-29: Start 590mQPM, CrCl<15 not defined." [phone] spoke with AmEstill Bambergo ask her about Simvastatin dose, if it was acceptable or if she should be switched to something like Atorvastatin. Made sure to emphasize that I was not changing her myself and that I didn't tell the patient, but that I wanted to see with her Kidney Function what the specialist preferred. They will call me back ?-07/09/21: CaJeannine Bogaack in regards to Simvastatin, left VM to call me back and leave VM if I don't answer ?April 2023: Nephro hasn't responded, will ask PCP about changing to Atorvastatin instead ? ?Diabetes (A1c goal <7%) ?Lab Results  ?Component Value Date  ? HGBA1C 8.0 (H) 07/08/2021  ? HGBA1C 9.7 (H) 03/25/2021  ? HGBA1C 6.6 (H) 12/17/2020  ? ?Lab Results  ?Component Value Date  ? LDKnoll[place]3 07/08/2021  ? CREATININE 2.73 (H) 07/30/2021  ? ?Lab Results  ?Component Value Date  ? NA 136 07/30/2021  ? K 4.2 07/30/2021  ? CREATININE 2.73 (H) 07/30/2021  ? EGFR 20 (L) 07/08/2021  ? GFRNONAA 25 (L) 06/26/2020  ? GLUCOSE 99 07/30/2021  ? ?Lab Results  ?Component Value Date  ? WBC 8.1 07/30/2021  ? HGB 11.1 (L) 07/30/2021  ? HCT 33.1 (L) 07/30/2021  ? MCV 95.2 07/30/2021  ? PLT 198.0 07/30/2021  ?-Not ideally controlled ?-Current medications: ?Levemir 44 AM - 22 PM Appropriate, Query effective,  Safe, Accessible ?-Medications previously tried: N/A  ?-Current home glucose readings ?Feb 2023: (PM dose is post-prandial) ?06/17/21: 104AM - ?06/16/21: 92AM - 180PM ?06/15/21:  95AM-204PM ?April 2023: ?08/08/21: AM111/PM 147 ?08/09/21: 112/153 ?08/10/21: 117/167 ?08/11/21: 121/164 ?-Denies hypoglycemic/hyperglycemic symptoms ?-Current meal patterns: Patient wasn't open and didn't talk, just said she "Tries to eat healthy" ?-Current exercise: None ?-Educated on A1c  and blood sugar goals; ?-Counseled to check feet daily and get yearly eye exams ?April 2023: Verbal per PCP to add short acting insulin. Patient didn't even let me talk to her about short acting, wants to wait until f/u with PCP 2 months from now to change anything. Patient wasn't interested in talking that much today ? ?Atrial Fibrillation (Goal: prevent stroke and major bleeding) ?-Managed by Dr. Harriet Masson, Cardio ?-Controlled ?-CHADSVASC: 6 ?-Current treatment: ?Rate control:  ?Hydralazine 144m BID Appropriate, Effective, Safe, Accessible ?Irbesartan 1510mQD Appropriate, Effective, Safe, Accessible ?Metolazone 52m60meek Appropriate, Effective, Safe, Accessible ?Metoprolol Tart 100m31mD Appropriate, Effective, Safe, Accessible ?Spironolactone 252mg352mAppropriate, Effective, Safe, Accessible ?Torsemide 20mg 78mppropriate, Effective, Safe, Accessible ?Anticoagulation:  ?Apixiban 2.52mg BI54mppropriate, Effective, Safe, Accessible ?-Medications previously tried: N/A ?-Home BP and HR readings:   ?Feb 2022: ?06/17/21: 123/83    HR: 87 ?06/16/21: 116/71  HR: 84 ?06/15/21: 118/78   ?-Counseled on importance of adherence to anticoagulant exactly as prescribed; ?-Recommended to continue current medication ? ?Heart Failure (Goal: manage symptoms and prevent exacerbations) ?-Controlled ?-Last ejection fraction: Unknown (Date: 06/05/21 Note from Cardio)Nazarethtype: Diastolic ?-NYHA Class: Unknown (Nothing in 06/05/21 note from Cardio) ?-AHA HF Stage: Unknown (Nothing in 06/05/21 note from Cardio) ?-Current treatment: ?Hydralazine 100mg BI20mpropriate, Effective, Safe, Accessible ?Irbesartan 150mg QD 40mopriate, Effective, Safe, Accessible ?Metolazone 52mg/week 77mropriate, Effective, Safe, Accessible ?Metoprolol Tart 100mg BID A61mpriate, Effective, Safe, Accessible ?Spironolactone 252mg QD App94miate, Effective, Safe, Accessible ?Torsemide 20mg QD Appr90mate, Effective, Safe, Accessible ?-Medications previously tried: N/A  ?-Current  home BP/HR readings: (See above) ?-Current home weight ?Feb 2023: ?06/17/21: 152.4 ?06/16/21: 154.4 ?06/15/21: 152.2   ?April 2023:  ?08/05/21: 148.6 lbs ?08/06/21: 148.8 ?08/07/21: 149.0 ?08/08/21: 148.4 ?08/09/21: 148.6 ?08/10/21: 148.2 ?08/11/21: 148.4 ?08/12/21: 148.4 ?-Current dietary habits: "Tries to eat healthy" ?-Current exercise habits: None ?-Educated on Benefits of medications for managing symptoms and prolonging life ?Feb 2023: Patient mentioned that her Torsemide was changed. She didn't think this was a good idea because fluid started building up, she called a few doctors who didn't get back to her. She ended up in the hospital due to fluid overload. She feels unheard and I made sure to stress that I am her to advocate for her and gave her my number to call ? ? ?Patient Goals/Self-Care Activities ?Patient will:  ?- take medications as prescribed as evidenced by patient report and record review ? ?Follow Up Plan: The patient has been provided with contact information for the care management team and has been advised to call with any health related questions or concerns.  ? ?CPP F/U July 2023 ? ?Sara Hancock [phone] ? ? ?  ? ? ?Sara Hancock was given information about Chronic Care Management services today including:  ?CCM service includes personalized support from designated clinical staff supervised by her physician, including individualized plan of care and coordination with other care providers ?24/7 contact phone numbers for assistance for urgent and routine care needs. ?Standard insurance, coinsurance, copays and deductibles apply for chronic care management only during months in which we provide at least 20 minutes of these services.  Most insurances cover these services at 100%, however patients may be responsible for any copay, coinsurance and/or deductible if applicable. This service may help you avoid the need for more expensive face-to-face services. ?Only one practitioner may furnish and  bill the service in a calendar month. ?The patient may stop CCM services at any time (effective at the end of the month) by phone call to the office staff. ? ?Patient agreed to services and verbal consent obtained.  ?

## 2021-08-12 NOTE — Progress Notes (Signed)
? ?Chronic Care Management ?Pharmacy Note ? ?08/12/2021 ?Name:  Sara Hancock MRN:  [id number] DOB:  August 10, 1934 ? ?Summary: ?-Pleasant 87 year old female presents for initial CCM visit. Has 2 children, 3 grandchildren, and husband passed, "Many years ago." Patient didn't open up much about herself, will continue to focus on building relationship ? ?Recommendations/Changes made from today's visit: ?-Nephro hasn't responded to question about Simvastatin. Recommend PCP change to Atorvastatin due to renal issues ?-Tried to broach subject of short term insulin with patient. Before I Hancock mentioned Short Term, when I said we were thinking about changing things because her sugars are still high (See values below) she stated she wants to wait until f/u with PCP. I reminded her that f/u is over 2 months from now but she wants to wait. ? ?Plan: ?-Patient requires high touches, will f/u every 2 months for a while ? ? ?Subjective: ?Sara Hancock is an 87 y.o. year old female who is a primary patient of Cox, Kirsten, MD.  The CCM team was consulted for assistance with disease management and care coordination needs.   ? ?Engaged with patient by telephone for initial visit in response to provider referral for pharmacy case management and/or care coordination services.  ? ?Consent to Services:  ?The patient was given the following information about Chronic Care Management services today, agreed to services, and gave verbal consent: 1. CCM service includes personalized support from designated clinical staff supervised by the primary care provider, including individualized plan of care and coordination with other care providers 2. 24/7 contact phone numbers for assistance for urgent and routine care needs. 3. Service will only be billed when office clinical staff spend 20 minutes or more in a month to coordinate care. 4. Only one practitioner may furnish and bill the service in a calendar month. 5.The patient may stop CCM  services at any time (effective at the end of the month) by phone call to the office staff. 6. The patient will be responsible for cost sharing (co-pay) of up to 20% of the service fee (after annual deductible is met). Patient agreed to services and consent obtained. ? ?Patient Care Team: ?CoxElnita Maxwell, MD as PCP - General (Family Medicine) ?Berniece Salines, DO as PCP - Cardiology (Cardiology) ?[name], MD as Consulting Physician (Gastroenterology) ?[name], MD as Consulting Physician (Cardiology) ?Ladene Artist, MD as Consulting Physician (Gastroenterology) ?Lane Hacker, [facility] as Pharmacist (Pharmacist) ?Reesa Chew, MD as Consulting Physician (Internal Medicine) ?Lane Hacker, [facility] (Pharmacist) ? ?Recent office visits:  ?05/14/21 Rochel Brome MD. 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 D/C Metolazone 5 mg.  ?  ?04/29/21 Reinaldo Meeker MD. Seen for PAF. No med changes.  ?  ?04/24/21 Reinaldo Meeker MD. Seen for Stasis Edema. No med changes. ?  ?04/11/21 Meredith Mody RN. Seen for Osteoporosis. Injected Prolia 39m in office. No med changes.  ?  ?04/01/21 CRochel BromeMD. Orders Only. Levemir. Increase to 35 U in am and 20 U in pm. Discontinue farxiga and januvia due to Stage 4 CKD. ?  ?03/28/21 CRochel BromeMD. Seen for Weakness of hips. Decreased Irbesartan from 300 mg to 1580mdaily. D/C Janumet HCI 50-100060m?  ?12/20/20 PerReinaldo Meeker. Seen for DM 2. No med changes.  ?  ?Recent consult visits:  ?06/05/21 (Cardiology) TobBerniece Salines. 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 Started on Metolazone 5 mg weekly. ?  ?04/15/21 (Cardiology) Telephone Encounter. Tobb, Kardie DO.  Please have her take the torsemide 20 mg for the next 3 days, then give her metolazone 5 mg for tomorrow to take 30 minutes before her torsemide dose ?  ?04/10/21 (Cardiology) Berniece Salines DO. 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  ?  ?Hospital visits:  ?Medication Reconciliation was completed by comparing discharge summary, patient?s EMR and Pharmacy list, and upon discussion with patient. ?  ?Admitted to the hospital on 02/11/21 due to Acute Renal failure.Marland Kitchen Discharge date was 02/15/21. Discharged from [facility]   ?  ?New?Medications Started at [facility] Discharge:?? ?-started Furosemide 35m , Eliquis 2.589m Flonase nasal spray, Lantus 100units and Lidocaine 5% patch ?  ?Medications Discontinued at Hospital Discharge: ?-Stopped Torsemide 2066mIrbesartan 300m62mevemir Flextouch 100units, Spironolactone 25mg56m?-All other medications will remain the same.   ? ? ?Objective: ? ?Lab Results  ?Component Value Date  ? CREATININE 2.73 (H) 07/30/2021  ? BUN 110 (HH) 07/30/2021  ? GFR 15.48 (L) 07/30/2021  ? EGFR 20 (L) 07/08/2021  ? GFRNONAA 25 (L) 06/26/2020  ? GFRAA 29 (L) 06/26/2020  ? NA 136 07/30/2021  ? K 4.2 07/30/2021  ? CALCIUM 9.6 07/30/2021  ? CO2 27 07/30/2021  ? GLUCOSE 99 07/30/2021  ? ? ?Lab Results  ?Component Value Date/Time  ? HGBA1C 8.0 (H) 07/08/2021 09:44 AM  ? HGBA1C 9.7 (H) 03/25/2021 02:09 PM  ? GFR 15.48 (L) 07/30/2021 11:39 AM  ? GFR 25.57 (L) 05/17/2020 12:06 PM  ?  ?Last diabetic Eye exam:  ?Lab Results  ?Component Value Date/Time  ? HMDIABEYEEXA No Retinopathy 04/26/2020 12:00 AM  ?  ?Last diabetic Foot exam: No results found for: HMDIABFOOTEX  ? ?Lab Results  ?Component Value Date  ? CHOL 141 07/08/2021  ? HDL 49 07/08/2021  ? LDLCAGoddard2/27/2023  ? TRIG 105 07/08/2021  ? CHOLHDL 2.6 03/25/2021  ? ? ? ?  Latest Ref Rng & Units 07/30/2021  ? 11:39 AM 07/08/2021  ?  9:44 AM 06/10/2021  ?  4:07 PM  ?Hepatic Function  ?Total Protein 6.0 - 8.3 g/dL 7.7   7.0   7.2    ?Albumin 3.5 - 5.2 g/dL 3.9   3.9   3.9    ?AST 0 - 37 U/L 25   24   33    ?ALT 0 - 35 U/L 19   16   24     ?Alk Phosphatase 39 - 117 U/L 135   148   183    ?Total Bilirubin 0.2 - 1.2 mg/dL 0.5   0.4   0.4    ? ? ?Lab Results  ?Component Value  Date/Time  ? TSH 2.020 09/10/2020 11:24 AM  ? TSH 1.67 02/26/2017 11:12 AM  ? ? ? ?  Latest Ref Rng & Units 07/30/2021  ? 11:39 AM 07/08/2021  ?  9:44 AM 06/10/2021  ?  4:07 PM  ?CBC  ?WBC 4.0 - 10.5 K/uL 8.1   7.3   7.9    ?Hemoglobin 12.0 - 15.0 g/dL 11.1   11.3   10.6    ?Hematocrit 36.0 - 46.0 % 33.1   33.2   32.1    ?Platelets 150.0 - 400.0 K/uL 198.0   218   248    ? ? ?No results found for: VD25OH ? ?Clinical ASCVD: No  ?The ASCVD Risk score (Arnett DK, et al., 2019) failed to calculate for the following reasons: ?  The 2019 ASCVD risk score is only valid for ages 43 to 24   ? ? ?  05/14/2021  ?  2:07 PM 03/28/2021  ? 10:48 AM 12/20/2020  ? 10:34 AM  ?Depression screen PHQ 2/9  ?Decreased Interest 0 0 0  ?Down, Depressed, Hopeless 0 0 0  ?PHQ - 2 Score 0 0 0  ?  ? ?Other: (CHADS2VASc if Afib, MMRC or CAT for COPD, ACT, DEXA) ? ?Social History  ? ?Tobacco Use  ?Smoking Status Never  ?Smokeless Tobacco Never  ? ?BP Readings from Last 3 Encounters:  ?08/06/21 108/78  ?07/11/21 (!) 110/50  ?06/20/21 112/76  ? ?Pulse Readings from Last 3 Encounters:  ?08/06/21 80  ?07/11/21 60  ?06/20/21 64  ? ?Wt Readings from Last 3 Encounters:  ?08/06/21 150 lb 9.6 oz (68.3 kg)  ?07/11/21 150 lb (68 kg)  ?06/20/21 154 lb (69.9 kg)  ? ?BMI Readings from Last 3 Encounters:  ?08/06/21 28.46 kg/m?  ?07/11/21 31.35 kg/m?  ?06/20/21 28.17 kg/m?  ? ? ?Assessment/Interventions: Review of patient past medical history, allergies, medications, health status, including review of consultants reports, laboratory and other test data, was performed as part of comprehensive evaluation and provision of chronic care management services.  ? ?SDOH:  (Social Determinants of Health) assessments and interventions performed: Yes ?SDOH Interventions   ? ?Flowsheet Row Most Recent Value  ?SDOH Interventions   ?Financial Strain Interventions Intervention Not Indicated  ?Transportation Interventions Intervention Not Indicated  ? ?  ? ?SDOH Screenings  ? ?Alcohol  Screen: Low Risk   ? Last Alcohol Screening Score (AUDIT): 0  ?Depression (PHQ2-9): Low Risk   ? PHQ-2 Score: 0  ?Financial Resource Strain: Low Risk   ? Difficulty of Paying Living Expenses: Not hard at al

## 2021-08-18 ENCOUNTER — Encounter: Payer: Self-pay | Admitting: Family Medicine

## 2021-08-18 NOTE — Assessment & Plan Note (Signed)
Due to CKD stage 4 other medication use is limited.  ?Increased insulin as above.  ? ?

## 2021-08-18 NOTE — Assessment & Plan Note (Signed)
Control: improved, but not at goal yet.  ?Recommend check sugars fasting daily. ?Recommend check feet daily. ?Recommend annual eye exams. ?Medicines: Levemir: ?Increase am dose to 44 U. ?Continue pm dose at 22 U.  ?Continue to work on eating a healthy diet and exercise.  ?Labs drawn today.   ? ?

## 2021-08-18 NOTE — Assessment & Plan Note (Signed)
Continue on prolia every 6 months. Due for Dexa in March 2023. ?

## 2021-08-18 NOTE — Assessment & Plan Note (Signed)
Anemia of chronic disease.  ?Hb stable.  ?

## 2021-08-18 NOTE — Assessment & Plan Note (Signed)
The current medical regimen is effective;  continue present plan and medications. Continue protonix 40 mg daily.  

## 2021-08-18 NOTE — Assessment & Plan Note (Signed)
Due to eliquis.  ?Needed due to atrial fibrillation ?

## 2021-08-18 NOTE — Assessment & Plan Note (Signed)
Management per specialist.  ?Continue eliquis, metoprolol. ?

## 2021-08-18 NOTE — Assessment & Plan Note (Signed)
Stable. Management per specialist. (Dr. Joylene Grapes, Dr. Harriet Masson) ?No changes to medicines. Continue Lopressor 100 mg twice daily, Avapro 150 mg once daily, spironolactone 25 mg daily, metolazone 5 mg once weekly, ?Continue to work on eating a healthy diet and exercise.  ?Labs drawn today.  ? ?

## 2021-08-18 NOTE — Assessment & Plan Note (Signed)
Well controlled.  ?No changes to medicines. Continue zocor. ?Continue to work on eating a healthy diet and exercise.  ?Labs drawn today.  ? ?

## 2021-08-20 ENCOUNTER — Encounter: Payer: Self-pay | Admitting: Family Medicine

## 2021-08-23 ENCOUNTER — Other Ambulatory Visit: Payer: Self-pay

## 2021-08-23 MED ORDER — ATORVASTATIN CALCIUM 40 MG PO TABS: 40.0000 mg | ORAL_TABLET | Freq: Every day | ORAL | 0 refills | Status: DC

## 2021-08-26 ENCOUNTER — Other Ambulatory Visit: Payer: Self-pay | Admitting: Cardiology

## 2021-08-26 DIAGNOSIS — I480 Paroxysmal atrial fibrillation: Secondary | ICD-10-CM

## 2021-08-27 NOTE — Telephone Encounter (Signed)
Prescription refill request for Eliquis received. ?Indication: Afib  ?Last office visit:06/05/21 (Tobb)  ?Scr: 2.73 (07/30/21)  ?Age: 87 ?Weight: 68.3kg ? ?Appropriate dose and refill sent to requested pharmacy.  ? ? ?

## 2021-09-08 DIAGNOSIS — E782 Mixed hyperlipidemia: Secondary | ICD-10-CM | POA: Diagnosis not present

## 2021-09-08 DIAGNOSIS — E1122 Type 2 diabetes mellitus with diabetic chronic kidney disease: Secondary | ICD-10-CM

## 2021-09-08 DIAGNOSIS — N184 Chronic kidney disease, stage 4 (severe): Secondary | ICD-10-CM

## 2021-09-09 ENCOUNTER — Other Ambulatory Visit: Payer: Medicare Other

## 2021-09-09 DIAGNOSIS — N184 Chronic kidney disease, stage 4 (severe): Secondary | ICD-10-CM | POA: Diagnosis not present

## 2021-09-11 ENCOUNTER — Encounter: Payer: Self-pay | Admitting: *Deleted

## 2021-09-11 ENCOUNTER — Encounter: Payer: Medicare Other | Admitting: Gastroenterology

## 2021-09-12 ENCOUNTER — Encounter: Payer: Medicare Other | Admitting: Internal Medicine

## 2021-09-12 ENCOUNTER — Ambulatory Visit (AMBULATORY_SURGERY_CENTER): Payer: Medicare Other | Admitting: Internal Medicine

## 2021-09-12 ENCOUNTER — Telehealth: Payer: Self-pay

## 2021-09-12 ENCOUNTER — Encounter: Payer: Self-pay | Admitting: Internal Medicine

## 2021-09-12 VITALS — BP 96/46 | HR 72 | Temp 95.9°F | Resp 17 | Ht 61.0 in | Wt 150.0 lb

## 2021-09-12 DIAGNOSIS — K648 Other hemorrhoids: Secondary | ICD-10-CM | POA: Diagnosis not present

## 2021-09-12 DIAGNOSIS — K2100 Gastro-esophageal reflux disease with esophagitis, without bleeding: Secondary | ICD-10-CM

## 2021-09-12 DIAGNOSIS — K625 Hemorrhage of anus and rectum: Secondary | ICD-10-CM

## 2021-09-12 DIAGNOSIS — R195 Other fecal abnormalities: Secondary | ICD-10-CM

## 2021-09-12 DIAGNOSIS — K449 Diaphragmatic hernia without obstruction or gangrene: Secondary | ICD-10-CM

## 2021-09-12 DIAGNOSIS — K745 Biliary cirrhosis, unspecified: Secondary | ICD-10-CM

## 2021-09-12 DIAGNOSIS — D125 Benign neoplasm of sigmoid colon: Secondary | ICD-10-CM | POA: Diagnosis not present

## 2021-09-12 DIAGNOSIS — D124 Benign neoplasm of descending colon: Secondary | ICD-10-CM | POA: Diagnosis not present

## 2021-09-12 DIAGNOSIS — K219 Gastro-esophageal reflux disease without esophagitis: Secondary | ICD-10-CM | POA: Diagnosis not present

## 2021-09-12 DIAGNOSIS — K743 Primary biliary cirrhosis: Secondary | ICD-10-CM

## 2021-09-12 MED ORDER — SODIUM CHLORIDE 0.9 % IV SOLN: 500.0000 mL | Freq: Once | INTRAVENOUS | Status: DC

## 2021-09-12 NOTE — Op Note (Addendum)
[facility] ?Patient Name: Sara Hancock ?Procedure Date: 09/12/2021 3:53 PM ?MRN: [id number] ?Endoscopist: Docia Chuck. Henrene Pastor , MD ?Age: 87 ?Referring MD:  ?Date of Birth: [birth year] ?Gender: Female ?Account #: [id number] ?Procedure:                Colonoscopy with cold snare polypectomy x 2 ?Indications:              Heme positive stool, Rectal bleeding ?Medicines:                Monitored Anesthesia Care ?Procedure:                Pre-Anesthesia Assessment: ?                          - Prior to the procedure, a History and Physical  ?                          was performed, and patient medications and  ?                          allergies were reviewed. The patient's tolerance of  ?                          previous anesthesia was also reviewed. The risks  ?                          and benefits of the procedure and the sedation  ?                          options and risks were discussed with the patient.  ?                          All questions were answered, and informed consent  ?                          was obtained. Prior Anticoagulants: The patient has  ?                          taken Eliquis (apixaban), last dose was 3 days  ?                          prior to procedure. ASA Grade Assessment: III - A  ?                          patient with severe systemic disease. After  ?                          reviewing the risks and benefits, the patient was  ?                          deemed in satisfactory condition to undergo the  ?                          procedure. ?  After obtaining informed consent, the colonoscope  ?                          was passed under direct vision. Throughout the  ?                          procedure, the patient's blood pressure, pulse, and  ?                          oxygen saturations were monitored continuously. The  ?                          CF [id number] #[id number] was introduced through the anus  ?                          and advanced to the the  cecum, identified by  ?                          appendiceal orifice and ileocecal valve. The  ?                          ileocecal valve, appendiceal orifice, and rectum  ?                          were photographed. The quality of the bowel  ?                          preparation was excellent. The colonoscopy was  ?                          performed without difficulty. The patient tolerated  ?                          the procedure well. The bowel preparation used was  ?                          SUPREP via split dose instruction. ?Scope In: 3:59:45 PM ?Scope Out: 4:15:16 PM ?Scope Withdrawal Time: 0 hours 8 minutes 32 seconds  ?Total Procedure Duration: 0 hours 15 minutes 31 seconds  ?Findings:                 Two polyps were found in the sigmoid colon and  ?                          descending colon. The polyps were 2 to 4 mm in  ?                          size. These polyps were removed with a cold snare.  ?                          Resection and retrieval were complete. ?                          Internal hemorrhoids were found  during retroflexion. ?                          The exam was otherwise without abnormality on  ?                          direct and retroflexion views. ?Complications:            No immediate complications. Estimated blood loss:  ?                          None. ?Estimated Blood Loss:     Estimated blood loss: none. ?Impression:               - Two 2 to 4 mm polyps in the sigmoid colon and in  ?                          the descending colon, removed with a cold snare.  ?                          Resected and retrieved. ?                          - Internal hemorrhoids. ?                          - The examination was otherwise normal on direct  ?                          and retroflexion views. ?Recommendation:           - Repeat colonoscopy is not recommended for  ?                          surveillance. ?                          - Patient has a contact number available for  ?                           emergencies. The signs and symptoms of potential  ?                          delayed complications were discussed with the  ?                          patient. Return to normal activities tomorrow.  ?                          Written discharge instructions were provided to the  ?                          patient. ?                          - Resume previous diet. ?                          -  Continue present medications. ?                          - Await pathology results. ?                          - Resume Eliquis today ?Docia Chuck. Henrene Pastor, MD ?09/12/2021 4:22:07 PM ?This report has been signed electronically. ?

## 2021-09-12 NOTE — Progress Notes (Signed)
? ? ?Chronic Care Management ?Pharmacy Assistant  ? ?Name: Sara Hancock  MRN: [id number] DOB: May 13, 1934 ? ? ?Reason for Encounter: Disease State call for DM  ?  ?Recent office visits:  ?None ? ?Recent consult visits:  ?None ? ?Hospital visits:  ?None ? ?Medications: ?Outpatient Encounter Medications as of 09/12/2021  ?Medication Sig  ? Accu-Chek FastClix Lancets MISC USE TO CHECK BLOOD SUGAR TWICE DAILY  ? atorvastatin (LIPITOR) 40 MG tablet Take 1 tablet (40 mg total) by mouth daily.  ? calcium-vitamin D (OSCAL WITH D) 250-125 MG-UNIT per tablet Take 1 tablet by mouth 2 (two) times daily.  ? Cholecalciferol (VITAMIN D3) 1000 units CAPS Take 1,000 Units by mouth daily.  ? ELIQUIS 2.5 MG TABS tablet TAKE 1 TABLET(2.5 MG) BY MOUTH TWICE DAILY  ? fexofenadine (ALLEGRA ALLERGY) 60 MG tablet Take 1 tablet (60 mg total) by mouth 2 (two) times daily. (Patient taking differently: Take 60 mg by mouth daily.)  ? fluticasone (FLONASE) 50 MCG/ACT nasal spray Place 1 spray into both nostrils as needed.   ? folic acid (FOLVITE) 580 MCG tablet Take 400 mcg by mouth daily.  ? glucose blood (ACCU-CHEK GUIDE) test strip Use as instructed twice daily. E11.9  ? insulin detemir (LEVEMIR FLEXTOUCH) 100 UNIT/ML FlexPen INJECT 38 UNITS UNDER THE SKIN EVERY MORNING AND 22 UNITS WITH SUPPER (Patient taking differently: INJECT 44 UNITS UNDER THE SKIN EVERY MORNING AND 22 UNITS WITH SUPPER)  ? Insulin Pen Needle (BD PEN NEEDLE NANO 2ND GEN) 32G X 4 MM MISC USE A NEEDLE EACH TIME WHEN CHECKING FBS  ? irbesartan (AVAPRO) 150 MG tablet Take 150 mg by mouth daily.  ? metolazone (ZAROXOLYN) 5 MG tablet Take 1 tablet (5 mg total) by mouth once a week.  ? metoprolol tartrate (LOPRESSOR) 100 MG tablet TAKE 1 TABLET(100 MG) BY MOUTH TWICE DAILY  ? Multiple Vitamin (MULTIVITAMIN WITH MINERALS) TABS Take 1 tablet by mouth daily.  ? nystatin cream (MYCOSTATIN) APPLY SUFFICIENT AMOUNT EXTERNALLY TO THE AFFECTED AREA TWICE DAILY  ? pantoprazole  (PROTONIX) 40 MG tablet TAKE 1 TABLET BY MOUTH EVERY DAY  ? spironolactone (ALDACTONE) 25 MG tablet Take 25 mg by mouth daily.  ? torsemide (DEMADEX) 20 MG tablet TAKE 2 TABLETS BY MOUTH TWICE DAILY (Patient taking differently: 20 mg daily.)  ? ursodiol (ACTIGALL) 300 MG capsule TAKE 1 CAPSULE(300 MG) BY MOUTH TWICE DAILY  ? vitamin C (ASCORBIC ACID) 500 MG tablet Take 500 mg by mouth daily.  ? vitamin E 400 UNIT capsule Take 400 Units by mouth daily.  ? ?No facility-administered encounter medications on file as of 09/12/2021.  ? ? ?Recent Relevant Labs: ?Lab Results  ?Component Value Date/Time  ? HGBA1C 8.0 (H) 07/08/2021 09:44 AM  ? HGBA1C 9.7 (H) 03/25/2021 02:09 PM  ?  ?Kidney Function ?Lab Results  ?Component Value Date/Time  ? CREATININE 2.73 (H) 07/30/2021 11:39 AM  ? CREATININE 2.36 (H) 07/08/2021 09:44 AM  ? GFR 15.48 (L) 07/30/2021 11:39 AM  ? GFRNONAA 25 (L) 06/26/2020 11:19 AM  ? GFRAA 29 (L) 06/26/2020 11:19 AM  ? ? ? ?Current antihyperglycemic regimen:  ?Levemir 44 AM - 22 PM ? ?Adherence Review: ?Is the patient currently on a STATIN medication? Yes ?Is the patient currently on ACE/ARB medication? Yes ?Does the patient have >5 day gap between last estimated fill dates? CPP to review ? ?Care Gaps: ?Last eye exam / Retinopathy Screening? 04/26/20 ?Last Annual Wellness Visit? None noted ?Last Diabetic Foot Exam? 07/11/21 ? ? ?  Star Rating Drugs:  ?Medication:  Last Fill: Day Supply ?None ? ?Unable to reach pt after several attempts  ? ?Elray Mcgregor, CMA ?Clinical Pharmacist Assistant  ?[phone]  ?

## 2021-09-12 NOTE — Progress Notes (Signed)
?  ?History of Present Illness: This is an 87 year old female referred for evaluation of occult blood in stool.  She has primary biliary cholangitis with cirrhosis.  She has intermittent, infrequent small-volume bright red blood on the tissue paper felt secondary to hemorrhoids for many years and this pattern has not changed.  She has a history of colon polyps and last underwent colonoscopy in November 2019.  There were no plans for future surveillance colonoscopies due to her age and comorbidities.  Recent evaluation with her liver disease showed stable parameters including ultrasound and blood work.  She is maintained on Eliquis for PAF and is followed by Dr. Harriet Masson. ?  ?  ?Current Medications, Allergies, Past Medical History, Past Surgical History, Family History and Social History were reviewed in [employer] record. ?  ?  ?Physical Exam: ?General: Well developed, well nourished, no acute distress ?Head: Normocephalic and atraumatic ?Eyes: Sclerae anicteric, EOMI ?Ears: Normal auditory acuity ?Mouth: Not examined, mask on during Covid-19 pandemic ?Lungs: Clear throughout to auscultation ?Heart: Regular rate and rhythm; no murmurs, rubs or bruits ?Abdomen: Soft, non tender and non distended. No masses, hepatosplenomegaly or hernias noted. Normal Bowel sounds ?Rectal: Deferred to colonoscopy ?Musculoskeletal: Symmetrical with no gross deformities  ?Pulses:  Normal pulses noted ?Extremities: No clubbing, cyanosis, edema or deformities noted ?Neurological: Alert oriented x 4, grossly nonfocal ?Psychological:  Alert and cooperative. Normal mood and affect ?  ?  ?Assessment and Recommendations: ?  ?Occult blood in stool. Anemia likely secondary to CKD4. History of adenomatous and sessile serrated colon polyps.  We discussed further evaluation with colonoscopy and EGD.  She is at higher risk for endoscopic procedure and anesthesia related complications.  She would like to proceed with colonoscopy  and EGD. The risks (including bleeding, perforation, infection, missed lesions, medication reactions and possible hospitalization or surgery if complications occur), benefits, and alternatives to colonoscopy with possible biopsy and possible polypectomy were discussed with the patient and they consent to proceed.  The risks (including bleeding, perforation, infection, missed lesions, medication reactions and possible hospitalization or surgery if complications occur), benefits, and alternatives to endoscopy with possible biopsy and possible dilation were discussed with the patient and they consent to proceed.   ?PBC stable.  Blood work and RUQ [place] in 6 months as planned.  EGD to evaluate for varices, portal gastropathy.   ?GERD with a history of an esophageal stricture and erosive gastritis.  Continue pantoprazole 40 mg p.o. daily.  Further evaluation with EGD as above. ?CKD4 ?Pulm hypertension ?PAF, EF=55-60%. Hold Eliquis 2 days before procedure - will instruct when and how to resume after procedure. Low but real risk of cardiovascular event such as heart attack, stroke, embolism, thrombosis or ischemia/infarct of other organs off [place] explained and need to seek urgent help if this occurs. The patient consents to proceed. Will communicate by phone or EMR with patient's prescribing provider to confirm that holding Eliquis is reasonable in this case.   ? ?Patient was evaluated in the office by his Dr. Fuller Plan as outlined above.  I have spoken to the patient myself and examined her preprocedure.  She was for colonoscopy to evaluate Hemoccult-positive stool and rectal bleeding as well as upper endoscopy to screen for varices.  She does have very mild dysphagia which she does not find problematic.  She is for colonoscopy and upper endoscopy this afternoon.  The procedure was delayed 1 day to allow for adequate washout of her Eliquis.  She took additional prep  without problems. ?

## 2021-09-12 NOTE — Progress Notes (Signed)
Called to room to assist during endoscopic procedure.  Patient ID and intended procedure confirmed with present staff. Received instructions for my participation in the procedure from the performing physician.  

## 2021-09-12 NOTE — Progress Notes (Signed)
Pt non-responsive, VVS, Report to RN  °

## 2021-09-12 NOTE — Patient Instructions (Signed)
Restart Eliquis today ? ? ? ?YOU HAD AN ENDOSCOPIC PROCEDURE TODAY: Refer to the procedure report and other information in the discharge instructions given to you for any specific questions about what was found during the examination. If this information does not answer your questions, please call [place] office at [phone] to clarify.  ? ?YOU SHOULD EXPECT: Some feelings of bloating in the abdomen. Passage of more gas than usual. Walking can help get rid of the air that was put into your GI tract during the procedure and reduce the bloating. If you had a lower endoscopy (such as a colonoscopy or flexible sigmoidoscopy) you may notice spotting of blood in your stool or on the toilet paper. Some abdominal soreness may be present for a day or two, also. ? ?DIET: Your first meal following the procedure should be a light meal and then it is ok to progress to your normal diet. A half-sandwich or bowl of soup is an example of a good first meal. Heavy or fried foods are harder to digest and may make you feel nauseous or bloated. Drink plenty of fluids but you should avoid alcoholic beverages for 24 hours. If you had a esophageal dilation, please see attached instructions for diet.   ? ?ACTIVITY: Your care partner should take you home directly after the procedure. You should plan to take it easy, moving slowly for the rest of the day. You can resume normal activity the day after the procedure however YOU SHOULD NOT DRIVE, use power tools, machinery or perform tasks that involve climbing or major physical exertion for 24 hours (because of the sedation medicines used during the test).  ? ?SYMPTOMS TO REPORT IMMEDIATELY: ?A gastroenterologist can be reached at any hour. Please call [phone]  for any of the following symptoms:  ?Following lower endoscopy (colonoscopy, flexible sigmoidoscopy) ?Excessive amounts of blood in the stool  ?Significant tenderness, worsening of abdominal pains  ?Swelling of the abdomen that is  new, acute  ?Fever of 100? or higher  ?Following upper endoscopy (EGD, EUS, ERCP, esophageal dilation) ?Vomiting of blood or coffee ground material  ?New, significant abdominal pain  ?New, significant chest pain or pain under the shoulder blades  ?Painful or persistently difficult swallowing  ?New shortness of breath  ?Black, tarry-looking or red, bloody stools ? ?FOLLOW UP:  ?If any biopsies were taken you will be contacted by phone or by letter within the next 1-3 weeks. Call [phone]  if you have not heard about the biopsies in 3 weeks.  ?Please also call with any specific questions about appointments or follow up tests.  ?

## 2021-09-12 NOTE — Op Note (Signed)
[facility] ?Patient Name: Sara Hancock ?Procedure Date: 09/12/2021 3:52 PM ?MRN: [id number] ?Endoscopist: Docia Chuck. Henrene Pastor , MD ?Age: 87 ?Referring MD:  ?Date of Birth: [birth year] ?Gender: Female ?Account #: [id number] ?Procedure:                Upper GI endoscopy ?Indications:              Esophageal reflux, Heme positive stool. History of  ?                          PBC, screening for varices ?Medicines:                Monitored Anesthesia Care ?Procedure:                Pre-Anesthesia Assessment: ?                          - Prior to the procedure, a History and Physical  ?                          was performed, and patient medications and  ?                          allergies were reviewed. The patient's tolerance of  ?                          previous anesthesia was also reviewed. The risks  ?                          and benefits of the procedure and the sedation  ?                          options and risks were discussed with the patient.  ?                          All questions were answered, and informed consent  ?                          was obtained. Prior Anticoagulants: The patient has  ?                          taken Eliquis (apixaban), last dose was 3 days  ?                          prior to procedure. ASA Grade Assessment: III - A  ?                          patient with severe systemic disease. After  ?                          reviewing the risks and benefits, the patient was  ?                          deemed in satisfactory condition to undergo the  ?  procedure. ?                          After obtaining informed consent, the endoscope was  ?                          passed under direct vision. Throughout the  ?                          procedure, the patient's blood pressure, pulse, and  ?                          oxygen saturations were monitored continuously. The  ?                          GIF [id number] #[id number] was introduced through the  ?                           mouth, and advanced to the second part of duodenum.  ?                          The upper GI endoscopy was accomplished without  ?                          difficulty. The patient tolerated the procedure  ?                          well. ?Scope In: ?Scope Out: ?Findings:                 The esophagus was normal. No esophagitis,  ?                          stricture, or varices. ?                          The stomach was normal save sliding hiatal hernia  ?                          and benign fundic gland polyps. No varices. ?                          The examined duodenum was normal. ?                          The cardia and gastric fundus were normal on  ?                          retroflexion, save hiatal. ?Complications:            No immediate complications. ?Estimated Blood Loss:     Estimated blood loss: none. ?Impression:               - Normal esophagus. ?                          - Normal stomach save benign fundic gland polyps  ?  and hiatal hernia. ?                          - Normal examined duodenum. ?                          - No specimens collected. ?Recommendation:           - Patient has a contact number available for  ?                          emergencies. The signs and symptoms of potential  ?                          delayed complications were discussed with the  ?                          patient. Return to normal activities tomorrow.  ?                          Written discharge instructions were provided to the  ?                          patient. ?                          - Resume previous diet. ?                          - Continue present medications. ?                          - Resume Eliquis (apixaban) at prior dose today. ?                          - Resume general medical care with your primary  ?                          care physician. GI follow-up with Dr. Fuller Plan as  ?                          needed ?Docia Chuck. Henrene Pastor, MD ?09/12/2021 4:33:52 PM ?This  report has been signed electronically. ?

## 2021-09-12 NOTE — Progress Notes (Signed)
I have reviewed the patient's medical history in detail and updated the computerized patient record. ? ? ?VS DT ?

## 2021-09-16 ENCOUNTER — Telehealth: Payer: Self-pay | Admitting: *Deleted

## 2021-09-16 NOTE — Telephone Encounter (Signed)
First attempt for follow up phone call. No answer at number given.  Left message on voicemail.   ?

## 2021-09-16 NOTE — Telephone Encounter (Signed)
?  Follow up Call- ? ? ?  09/12/2021  ?  2:56 PM  ?Call back number  ?Post procedure Call Back phone  # [phone]  ?Permission to leave phone message Yes  ?  ? ?Patient questions: ? ?Do you have a fever, pain , or abdominal swelling? No. ?Pain Score  0 * ? ?Have you tolerated food without any problems? Yes.   ? ?Have you been able to return to your normal activities? Yes.   ? ?Do you have any questions about your discharge instructions: ?Diet   No. ?Medications  No. ?Follow up visit  No. ? ?Do you have questions or concerns about your Care? No. ? ?Actions: ?* If pain score is 4 or above: ?No action needed, pain <4. ? ? ?

## 2021-09-17 ENCOUNTER — Encounter: Payer: Self-pay | Admitting: Internal Medicine

## 2021-09-17 DIAGNOSIS — N179 Acute kidney failure, unspecified: Secondary | ICD-10-CM | POA: Diagnosis not present

## 2021-09-17 DIAGNOSIS — E1122 Type 2 diabetes mellitus with diabetic chronic kidney disease: Secondary | ICD-10-CM | POA: Diagnosis not present

## 2021-09-17 DIAGNOSIS — D631 Anemia in chronic kidney disease: Secondary | ICD-10-CM | POA: Diagnosis not present

## 2021-09-17 DIAGNOSIS — E785 Hyperlipidemia, unspecified: Secondary | ICD-10-CM | POA: Diagnosis not present

## 2021-09-17 DIAGNOSIS — I129 Hypertensive chronic kidney disease with stage 1 through stage 4 chronic kidney disease, or unspecified chronic kidney disease: Secondary | ICD-10-CM | POA: Diagnosis not present

## 2021-09-17 DIAGNOSIS — N184 Chronic kidney disease, stage 4 (severe): Secondary | ICD-10-CM | POA: Diagnosis not present

## 2021-09-18 ENCOUNTER — Telehealth: Payer: Self-pay

## 2021-09-18 NOTE — Chronic Care Management (AMB) (Signed)
? ? ?  Chronic Care Management ?Pharmacy Assistant  ? ?Name: CHRISTYANN MANOLIS  MRN: [id number] DOB: Apr 21, 1935 ? ?Reason for Encounter: Prolia Benefit Verification ?  ?Patient last Prolia injection was on 04/11/2021, patient due for next injection on 10/11/2021, benefit verification completed through [employer] Portal: ? ?Physician Purchase coverage available and No PA required. Benefits subject to a $226.00 deductible ($226.00 met) and 20% co-insurance for the administration and cost of Prolia. The benefits provided on this Verification of Benefits form are Medical Benefits and are the patient's In-Network benefits for Prolia.  ? ?Called patient, no answer, left message that Rhae Hammock, LPN will call patient when medication has arrived to the office closer to when she is due.  ? ?Medications: ?Outpatient Encounter Medications as of 09/18/2021  ?Medication Sig  ? Accu-Chek FastClix Lancets MISC USE TO CHECK BLOOD SUGAR TWICE DAILY  ? atorvastatin (LIPITOR) 40 MG tablet Take 1 tablet (40 mg total) by mouth daily.  ? calcium-vitamin D (OSCAL WITH D) 250-125 MG-UNIT per tablet Take 1 tablet by mouth 2 (two) times daily.  ? Cholecalciferol (VITAMIN D3) 1000 units CAPS Take 1,000 Units by mouth daily.  ? ELIQUIS 2.5 MG TABS tablet TAKE 1 TABLET(2.5 MG) BY MOUTH TWICE DAILY  ? fexofenadine (ALLEGRA ALLERGY) 60 MG tablet Take 1 tablet (60 mg total) by mouth 2 (two) times daily. (Patient taking differently: Take 60 mg by mouth daily.)  ? fluticasone (FLONASE) 50 MCG/ACT nasal spray Place 1 spray into both nostrils as needed.  (Patient not taking: Reported on 09/12/2021)  ? folic acid (FOLVITE) 124 MCG tablet Take 400 mcg by mouth daily.  ? glucose blood (ACCU-CHEK GUIDE) test strip Use as instructed twice daily. E11.9  ? insulin detemir (LEVEMIR FLEXTOUCH) 100 UNIT/ML FlexPen INJECT 38 UNITS UNDER THE SKIN EVERY MORNING AND 22 UNITS WITH SUPPER (Patient taking differently: INJECT 44 UNITS UNDER THE SKIN EVERY MORNING AND 22 UNITS  WITH SUPPER)  ? Insulin Pen Needle (BD PEN NEEDLE NANO 2ND GEN) 32G X 4 MM MISC USE A NEEDLE EACH TIME WHEN CHECKING FBS  ? irbesartan (AVAPRO) 150 MG tablet Take 150 mg by mouth daily.  ? metolazone (ZAROXOLYN) 5 MG tablet Take 1 tablet (5 mg total) by mouth once a week.  ? metoprolol tartrate (LOPRESSOR) 100 MG tablet TAKE 1 TABLET(100 MG) BY MOUTH TWICE DAILY  ? Multiple Vitamin (MULTIVITAMIN WITH MINERALS) TABS Take 1 tablet by mouth daily.  ? nystatin cream (MYCOSTATIN) APPLY SUFFICIENT AMOUNT EXTERNALLY TO THE AFFECTED AREA TWICE DAILY  ? pantoprazole (PROTONIX) 40 MG tablet TAKE 1 TABLET BY MOUTH EVERY DAY  ? spironolactone (ALDACTONE) 25 MG tablet Take 25 mg by mouth daily.  ? torsemide (DEMADEX) 20 MG tablet TAKE 2 TABLETS BY MOUTH TWICE DAILY (Patient taking differently: 20 mg daily.)  ? ursodiol (ACTIGALL) 300 MG capsule TAKE 1 CAPSULE(300 MG) BY MOUTH TWICE DAILY  ? vitamin C (ASCORBIC ACID) 500 MG tablet Take 500 mg by mouth daily.  ? vitamin E 400 UNIT capsule Take 400 Units by mouth daily.  ? ?No facility-administered encounter medications on file as of 09/18/2021.  ? ? ?Pattricia Boss, CMA ?Clinical Pharmacist Assistant ?[phone] ? ?

## 2021-09-19 ENCOUNTER — Other Ambulatory Visit: Payer: Self-pay

## 2021-09-19 MED ORDER — LEVEMIR FLEXTOUCH 100 UNIT/ML ~~LOC~~ SOPN: PEN_INJECTOR | SUBCUTANEOUS | 1 refills | Status: DC

## 2021-09-23 ENCOUNTER — Encounter: Payer: Medicare Other | Admitting: Internal Medicine

## 2021-09-23 ENCOUNTER — Other Ambulatory Visit: Payer: Medicare Other

## 2021-09-24 ENCOUNTER — Other Ambulatory Visit: Payer: Medicare Other

## 2021-09-24 ENCOUNTER — Telehealth: Payer: Self-pay

## 2021-09-24 DIAGNOSIS — N184 Chronic kidney disease, stage 4 (severe): Secondary | ICD-10-CM | POA: Diagnosis not present

## 2021-09-24 NOTE — Telephone Encounter (Signed)
LM for patient to return call.  Patient needs to schedule her Prolia injection 10/11/21 or after.  She has an upcoming appt for labs on 6/5 and an appointment with Dr Tobie Poet on 6/8. ?

## 2021-09-30 ENCOUNTER — Ambulatory Visit (INDEPENDENT_AMBULATORY_CARE_PROVIDER_SITE_OTHER): Payer: Medicare Other

## 2021-09-30 DIAGNOSIS — Z23 Encounter for immunization: Secondary | ICD-10-CM | POA: Diagnosis not present

## 2021-09-30 NOTE — Progress Notes (Signed)
   Covid-[facility] Vaccination [facility]  Name:  Sara Hancock    MRN: [id number] DOB: 1934/08/04  09/30/2021  Sara Hancock was observed post Covid-19 immunization for 15 minutes without incident. She was provided with Vaccine Information Sheet and instruction to access the V-Safe system.   Sara Hancock was instructed to call 911 with any severe reactions post vaccine: Difficulty breathing  Swelling of face and throat  A fast heartbeat  A bad rash all over body  Dizziness and weakness   Immunizations Administered     Name Date Dose VIS Date Route   Pfizer Covid-19 Vaccine Bivalent Booster 09/30/2021 11:51 AM 0.3 mL 01/09/2021 Intramuscular   Manufacturer: [place]   Lot: [id number]   [place]: [phone]

## 2021-10-10 ENCOUNTER — Other Ambulatory Visit: Payer: Self-pay | Admitting: Gastroenterology

## 2021-10-14 ENCOUNTER — Other Ambulatory Visit: Payer: Medicare Other

## 2021-10-15 ENCOUNTER — Other Ambulatory Visit: Payer: Self-pay

## 2021-10-15 DIAGNOSIS — E119 Type 2 diabetes mellitus without complications: Secondary | ICD-10-CM | POA: Diagnosis not present

## 2021-10-15 DIAGNOSIS — E782 Mixed hyperlipidemia: Secondary | ICD-10-CM | POA: Diagnosis not present

## 2021-10-16 LAB — CBC WITH DIFFERENTIAL/PLATELET
Basophils Absolute: 0 10*3/uL (ref 0.0–0.2)
Basos: 0 %
EOS (ABSOLUTE): 0.1 10*3/uL (ref 0.0–0.4)
Eos: 1 %
Hematocrit: 33.5 % — ABNORMAL LOW (ref 34.0–46.6)
Hemoglobin: 11 g/dL — ABNORMAL LOW (ref 11.1–15.9)
Immature Grans (Abs): 0 10*3/uL (ref 0.0–0.1)
Immature Granulocytes: 0 %
Lymphocytes Absolute: 1.6 10*3/uL (ref 0.7–3.1)
Lymphs: 20 %
MCH: 31.9 pg (ref 26.6–33.0)
MCHC: 32.8 g/dL (ref 31.5–35.7)
MCV: 97 fL (ref 79–97)
Monocytes Absolute: 0.8 10*3/uL (ref 0.1–0.9)
Monocytes: 10 %
Neutrophils Absolute: 5.4 10*3/uL (ref 1.4–7.0)
Neutrophils: 69 %
Platelets: 236 10*3/uL (ref 150–450)
RBC: 3.45 x10E6/uL — ABNORMAL LOW (ref 3.77–5.28)
RDW: 12.8 % (ref 11.7–15.4)
WBC: 7.9 10*3/uL (ref 3.4–10.8)

## 2021-10-16 LAB — COMPREHENSIVE METABOLIC PANEL
ALT: 18 IU/L (ref 0–32)
AST: 22 IU/L (ref 0–40)
Albumin/Globulin Ratio: 1.1 — ABNORMAL LOW (ref 1.2–2.2)
Albumin: 3.8 g/dL (ref 3.6–4.6)
Alkaline Phosphatase: 263 IU/L — ABNORMAL HIGH (ref 44–121)
BUN/Creatinine Ratio: 32 — ABNORMAL HIGH (ref 12–28)
BUN: 75 mg/dL — ABNORMAL HIGH (ref 8–27)
Bilirubin Total: 0.4 mg/dL (ref 0.0–1.2)
CO2: 25 mmol/L (ref 20–29)
Calcium: 9.7 mg/dL (ref 8.7–10.3)
Chloride: 97 mmol/L (ref 96–106)
Creatinine, Ser: 2.36 mg/dL — ABNORMAL HIGH (ref 0.57–1.00)
Globulin, Total: 3.6 g/dL (ref 1.5–4.5)
Glucose: 123 mg/dL — ABNORMAL HIGH (ref 70–99)
Potassium: 4.2 mmol/L (ref 3.5–5.2)
Sodium: 142 mmol/L (ref 134–144)
Total Protein: 7.4 g/dL (ref 6.0–8.5)
eGFR: 20 mL/min/{1.73_m2} — ABNORMAL LOW (ref >59)

## 2021-10-16 LAB — HEMOGLOBIN A1C
Est. average glucose Bld gHb Est-mCnc: 206 mg/dL
Hgb A1c MFr Bld: 8.8 % — ABNORMAL HIGH (ref 4.8–5.6)

## 2021-10-16 LAB — LIPID PANEL
Chol/HDL Ratio: 2.6 ratio (ref 0.0–4.4)
Cholesterol, Total: 123 mg/dL (ref 100–199)
HDL: 48 mg/dL (ref >39)
LDL Chol Calc (NIH): 58 mg/dL (ref 0–99)
Triglycerides: 91 mg/dL (ref 0–149)
VLDL Cholesterol Cal: 17 mg/dL (ref 5–40)

## 2021-10-16 LAB — CARDIOVASCULAR RISK ASSESSMENT

## 2021-10-17 ENCOUNTER — Ambulatory Visit: Payer: Medicare Other | Admitting: Family Medicine

## 2021-10-17 ENCOUNTER — Encounter: Payer: Self-pay | Admitting: Family Medicine

## 2021-10-17 ENCOUNTER — Ambulatory Visit (INDEPENDENT_AMBULATORY_CARE_PROVIDER_SITE_OTHER): Payer: Medicare Other | Admitting: Family Medicine

## 2021-10-17 VITALS — BP 128/80 | HR 88 | Temp 97.2°F | Ht 61.0 in | Wt 147.0 lb

## 2021-10-17 DIAGNOSIS — M810 Age-related osteoporosis without current pathological fracture: Secondary | ICD-10-CM

## 2021-10-17 DIAGNOSIS — H6123 Impacted cerumen, bilateral: Secondary | ICD-10-CM

## 2021-10-17 DIAGNOSIS — I480 Paroxysmal atrial fibrillation: Secondary | ICD-10-CM | POA: Diagnosis not present

## 2021-10-17 DIAGNOSIS — E1122 Type 2 diabetes mellitus with diabetic chronic kidney disease: Secondary | ICD-10-CM | POA: Diagnosis not present

## 2021-10-17 DIAGNOSIS — N184 Chronic kidney disease, stage 4 (severe): Secondary | ICD-10-CM | POA: Diagnosis not present

## 2021-10-17 DIAGNOSIS — K743 Primary biliary cirrhosis: Secondary | ICD-10-CM | POA: Diagnosis not present

## 2021-10-17 DIAGNOSIS — E119 Type 2 diabetes mellitus without complications: Secondary | ICD-10-CM | POA: Diagnosis not present

## 2021-10-17 DIAGNOSIS — I131 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, with stage 1 through stage 4 chronic kidney disease, or unspecified chronic kidney disease: Secondary | ICD-10-CM | POA: Diagnosis not present

## 2021-10-17 DIAGNOSIS — E782 Mixed hyperlipidemia: Secondary | ICD-10-CM | POA: Diagnosis not present

## 2021-10-17 DIAGNOSIS — K921 Melena: Secondary | ICD-10-CM

## 2021-10-17 DIAGNOSIS — K219 Gastro-esophageal reflux disease without esophagitis: Secondary | ICD-10-CM

## 2021-10-17 DIAGNOSIS — Z794 Long term (current) use of insulin: Secondary | ICD-10-CM | POA: Diagnosis not present

## 2021-10-17 MED ORDER — DENOSUMAB 60 MG/ML ~~LOC~~ SOSY
60.0000 mg | PREFILLED_SYRINGE | Freq: Once | SUBCUTANEOUS | Status: AC
  Administered 2021-10-17: 60 mg via SUBCUTANEOUS

## 2021-10-17 NOTE — Patient Instructions (Signed)
Diabetes worsened.   Recommend Increase Levemir to 48 units in a.m. and 26 units in p.m.  Recommend call with sugars in about 2 weeks so we can adjust her medicines.

## 2021-10-17 NOTE — Progress Notes (Unsigned)
Subjective:  Patient ID: Sara Hancock, female    DOB: 06/26/1934  Age: 87 y.o. MRN: [id number]  Chief Complaint  Patient presents with   Diabetes   Chronic Kidney failure   Hyperlipidemia   Diabetes:  Complications:CKD stage 4 Glucose checking: twice daily.  Glucose logs: 90-110 in am, 235-240 before supper..   Hypoglycemia: no Most recent A1C: 8.8.Marland Kitchen  Current medications: Levemir 44 units every AM and 22 every PM.  Last Eye Exam:08/07/2021. No diabetic retinopathy. Foot checks:daily Sees Dr. Joylene Grapes for nephrology.   Hyperlipidemia: Current medications:  atorvastatin 40 mg daily.    Hypertension/diastolic heart failure/atrial fibrillation:   Complications: CKD stage 4. CONGESTIVE HEART FAILURE. BP 130-139.80-90s pulse 70-80s. Current medications:  Lopressor 100 mg twice daily, spironolactone 25 mg daily, patient has not been taking metolazone 5 mg once weekly because patient has not been swelling since restarting torsemide 40 mg twice daily. Dr. Osborne Casco stopped irbesartan. On eliquis 2.5 mg twice daily. Patient sees Dr. Caryl Comes and Dr. Harriet Masson.  Primary biliary cirrhosis: on ursodiol . Sees Dr. Fuller Plan.   GERD: on pantoprazole 40 mg once daily.  Osteoporosis: on prolia every 6 months. Due for Dexa in March 2023. Patient's dexa ordered for next month.   Diet: healthy. 3 meals per day.  Exercise: walking some daily. Does stretching exercises daily.   HPI reviewed and updated.    Current Outpatient Medications on File Prior to Visit  Medication Sig Dispense Refill   Accu-Chek FastClix Lancets MISC USE TO CHECK BLOOD SUGAR TWICE DAILY 204 each 3   atorvastatin (LIPITOR) 40 MG tablet Take 1 tablet (40 mg total) by mouth daily. 90 tablet 0   calcium-vitamin D (OSCAL WITH D) 250-125 MG-UNIT per tablet Take 1 tablet by mouth 2 (two) times daily.     Cholecalciferol (VITAMIN D3) 1000 units CAPS Take 1,000 Units by mouth daily.     ELIQUIS 2.5 MG TABS tablet TAKE 1 TABLET(2.5 MG)  BY MOUTH TWICE DAILY 180 tablet 1   fexofenadine (ALLEGRA ALLERGY) 60 MG tablet Take 1 tablet (60 mg total) by mouth 2 (two) times daily. (Patient taking differently: Take 60 mg by mouth daily.) 180 tablet 1   fluticasone (FLONASE) 50 MCG/ACT nasal spray Place 1 spray into both nostrils as needed.     folic acid (FOLVITE) 676 MCG tablet Take 400 mcg by mouth daily.     glucose blood (ACCU-CHEK GUIDE) test strip Use as instructed twice daily. E11.9 200 each 3   insulin detemir (LEVEMIR FLEXTOUCH) 100 UNIT/ML FlexPen INJECT 44 UNITS UNDER THE SKIN EVERY MORNING AND 22 UNITS WITH SUPPER 54 mL 1   Insulin Pen Needle (BD PEN NEEDLE NANO 2ND GEN) 32G X 4 MM MISC USE A NEEDLE EACH TIME WHEN CHECKING FBS 200 each 3   metoprolol tartrate (LOPRESSOR) 100 MG tablet TAKE 1 TABLET(100 MG) BY MOUTH TWICE DAILY 180 tablet 3   Multiple Vitamin (MULTIVITAMIN WITH MINERALS) TABS Take 1 tablet by mouth daily.     nystatin cream (MYCOSTATIN) APPLY SUFFICIENT AMOUNT EXTERNALLY TO THE AFFECTED AREA TWICE DAILY 30 g 1   pantoprazole (PROTONIX) 40 MG tablet TAKE 1 TABLET BY MOUTH EVERY DAY 90 tablet 3   spironolactone (ALDACTONE) 25 MG tablet Take 25 mg by mouth daily.     torsemide (DEMADEX) 20 MG tablet TAKE 2 TABLETS BY MOUTH TWICE DAILY (Patient taking differently: 20 mg daily.) 360 tablet 0   ursodiol (ACTIGALL) 300 MG capsule TAKE 1 CAPSULE(300 MG) BY MOUTH TWICE  DAILY 180 capsule 0   vitamin C (ASCORBIC ACID) 500 MG tablet Take 500 mg by mouth daily.     vitamin E 400 UNIT capsule Take 400 Units by mouth daily.     metolazone (ZAROXOLYN) 5 MG tablet Take 1 tablet (5 mg total) by mouth once a week. 4 tablet 6   No current facility-administered medications on file prior to visit.   Past Medical History:  Diagnosis Date   Acquired thrombophilia ([place]) 09/11/2019   Allergic rhinitis 09/17/2015   Anemia    ANEMIA, NORMOCYTIC 10/27/2007   Qualifier: Diagnosis of  By: Nils Pyle CMA (AAMA), Leisha     Anticoagulant  long-term use 01/08/2011   Arthritis    Benign neoplasm of colon 01/08/2011   Biliary cirrhosis ([place])    BRADYCARDIA 11/15/2008   Qualifier: Diagnosis of  By: Hollie Salk CMA, Amanda     Cellulitis of left foot 09/25/2016   Cholelithiasis 01/09/12   Chronic kidney disease, stage 3 (HCC)    Combined form of age-related cataract, both eyes 02/26/2017   Diabetes mellitus type 2 without retinopathy ([place]) 10/27/2013   Diverticulosis of colon with hemorrhage 06/08/2000   Dyslipidemia 09/17/2015   Encounter for therapeutic drug monitoring 06/09/2013   Esophageal reflux 01/08/2011   Esophageal stenosis 01/08/11   Esophagitis 01/08/11   GALLSTONES 10/26/2008   Qualifier: Diagnosis of  By: Sharlett Iles MD Byrd Hesselbach    Gastritis, erosive chronic 01/08/2011   GERD (gastroesophageal reflux disease)    Glaucoma suspect of both eyes 02/26/2017   Glaucoma suspect of both eyes 02/26/2017   Hiatal hernia 01/08/11   History of IBS 10/27/2007   Qualifier: Diagnosis of  By: Nils Pyle CMA (AAMA), Mearl Latin     HTN (hypertension)    Hx of adenomatous colonic polyps 01/08/11   Hyperplastic colon polyp 06/08/2000, 07/31/2003, 01/08/11   Hypertension, essential 10/27/2007   Qualifier: Diagnosis of  By: Nils Pyle CMA (AAMA), Mearl Latin     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, with stage 1 through stage 4 chronic kidney disease, or unspecified chronic kidney disease    Iron deficiency anemia 01/08/2011   Left foot pain 09/25/2016   Localized edema    Long term (current) use of insulin (HCC)    Mixed hyperlipidemia    Nuclear cataract 07/03/2011   Obesity    Obesity (BMI 30-39.9) 03/22/2012   Osteoarthritis of right thumb 09/17/2015   Osteopenia    Other iron deficiency anemias    Other specified menopausal and perimenopausal disorders    PAF (paroxysmal atrial fibrillation) (HCC)    occurring in the setting of bradycardia and mild first degree AV block followed by Dr. Jens Som   Pain in thoracic spine     Paraesophageal hiatal hernia, 3cm by EGD 03/22/2012   Pedal edema 12/13/2019   Persistent atrial fibrillation ([place]) 08/28/2019   Posterior vitreous detachment, bilateral 01/10/2016   Primary biliary cholangitis ([place]) 10/27/2007   Qualifier: Diagnosis of  By: Nils Pyle CMA (AAMA), Mearl Latin     Pure hypercholesterolemia    Retinal tear, right 07/03/2011   Shortness of breath 12/13/2019   Stage 3b chronic kidney disease ([place]) 09/11/2019   Type II or unspecified type diabetes mellitus without mention of complication, not stated as uncontrolled    Uterine fibroid 01/09/12   Past Surgical History:  Procedure Laterality Date   BREAST CYST EXCISION     left   CARDIOVERSION N/A 11/27/2017   Procedure: CARDIOVERSION;  Surgeon: Sanda Klein, MD;  Location: [facility];  Service: Cardiovascular;  Laterality: N/A;   COLONOSCOPY     INSERTION OF MESH  05/18/2012   Procedure: INSERTION OF MESH;  Surgeon: Adin Hector, MD;  Location: [facility];  Service: General;  Laterality: N/A;   LIVER BIOPSY  05/18/2012   Procedure: LIVER BIOPSY;  Surgeon: Adin Hector, MD;  Location: [facility];  Service: General;;   POLYPECTOMY     THYROIDECTOMY, [place]  05/18/2012   Procedure: LAPAROSCOPIC VENTRAL HERNIA;  Surgeon: Adin Hector, MD;  Location: [facility];  Service: General;  Laterality: N/A;  Laparoscopic Ventral Wall Hernia with Mesh    Family History  Problem Relation Age of Onset   Stroke Mother 50   Heart attack Father    Diabetes Sister        x 2   Aneurysm Sister    Stroke Sister    Congestive Heart Failure Sister    Diabetes Brother    Kidney failure Other    Hyperlipidemia Other    Arrhythmia Other    Congestive Heart Failure Other    Hypertension Other    Arthritis Other    Colon cancer Neg Hx    Esophageal cancer Neg Hx    Stomach cancer Neg Hx    Rectal cancer Neg Hx    Social History   Socioeconomic History   Marital status: Married    Spouse  name: Not on file   Number of children: 2   Years of education: Not on file   Highest education level: Not on file  Occupational History   Occupation: retired    Fish farm manager: RETIRED  Tobacco Use   Smoking status: Never   Smokeless tobacco: Never  Vaping Use   Vaping Use: Never used  Substance and Sexual Activity   Alcohol use: No   Drug use: No   Sexual activity: Not Currently  Other Topics Concern   Not on file  Social History Narrative   Not on file   Social Determinants of Health   Financial Resource Strain: Low Risk  (08/12/2021)   Overall Financial Resource Strain (CARDIA)    Difficulty of Paying Living Expenses: Not hard at all  Food Insecurity: Not on file  Transportation Needs: No Transportation Needs (08/12/2021)   PRAPARE - Hydrologist (Medical): No    Lack of Transportation (Non-Medical): No  Physical Activity: Not on file  Stress: Not on file  Social Connections: Not on file    Review of Systems  Constitutional:  Negative for appetite change, fatigue and fever.  HENT:  Negative for congestion, ear pain, sinus pressure and sore throat.   Respiratory:  Negative for cough, chest tightness, shortness of breath and wheezing.   Cardiovascular:  Negative for chest pain and palpitations.  Gastrointestinal:  Negative for abdominal pain, constipation, diarrhea, nausea and vomiting.  Genitourinary:  Negative for dysuria and hematuria.  Musculoskeletal:  Negative for arthralgias, back pain, joint swelling and myalgias.  Skin:  Negative for rash.  Neurological:  Negative for dizziness, weakness and headaches.  Psychiatric/Behavioral:  Negative for dysphoric mood. The patient is not nervous/anxious.      Objective:  BP 128/80 (BP Location: Left Arm, Patient Position: Sitting)   Pulse 88   Temp (!) 97.2 F (36.2 C) (Temporal)   Ht '5\' 1"'$  (1.549 m)   Wt 147 lb (66.7 kg)   SpO2 98%   BMI 27.78 kg/m  10/17/2021   10:09 AM 09/12/2021     4:42 PM 09/12/2021    4:32 PM  BP/Weight  Systolic BP 841 96 91  Diastolic BP 80 46 37  Wt. (Lbs) 147    BMI 27.78 kg/m2      Physical Exam Vitals reviewed.  Constitutional:      Appearance: Normal appearance. She is normal weight.  HENT:     Right Ear: There is impacted cerumen.     Left Ear: There is impacted cerumen.     Nose: No congestion or rhinorrhea.     Mouth/Throat:     Pharynx: No oropharyngeal exudate or posterior oropharyngeal erythema.  Neck:     Vascular: No carotid bruit.  Cardiovascular:     Rate and Rhythm: Normal rate and regular rhythm.     Pulses: Normal pulses.     Heart sounds: Normal heart sounds.  Pulmonary:     Effort: Pulmonary effort is normal.     Breath sounds: Normal breath sounds.  Abdominal:     General: Abdomen is flat. Bowel sounds are normal.     Palpations: Abdomen is soft.     Tenderness: There is no abdominal tenderness.  Neurological:     Mental Status: She is alert and oriented to person, place, and time.  Psychiatric:        Mood and Affect: Mood normal.        Behavior: Behavior normal.     Diabetic Foot Exam - Simple   Simple Foot Form  10/17/2021 11:35 PM  Visual Inspection No deformities, no ulcerations, no other skin breakdown bilaterally: Yes Sensation Testing Intact to touch and monofilament testing bilaterally: Yes Pulse Check Posterior Tibialis and Dorsalis pulse intact bilaterally: Yes Comments      Lab Results  Component Value Date   WBC 7.9 10/15/2021   HGB 11.0 (L) 10/15/2021   HCT 33.5 (L) 10/15/2021   PLT 236 10/15/2021   GLUCOSE 123 (H) 10/15/2021   CHOL 123 10/15/2021   TRIG 91 10/15/2021   HDL 48 10/15/2021   LDLCALC 58 10/15/2021   ALT 18 10/15/2021   AST 22 10/15/2021   NA 142 10/15/2021   K 4.2 10/15/2021   CL 97 10/15/2021   CREATININE 2.36 (H) 10/15/2021   BUN 75 (H) 10/15/2021   CO2 25 10/15/2021   TSH 2.020 09/10/2020   INR 1.1 (H) 07/30/2021   HGBA1C 8.8 (H) 10/15/2021       Assessment & Plan:   Problem List Items Addressed This Visit       Cardiovascular and Mediastinum   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, with stage 1 through stage 4 chronic kidney disease, or unspecified chronic kidney disease    Well controlled.  No changes to medicines. Lopressor 100 mg twice daily, spironolactone 25 mg daily, torsemide 40 mg twice daily.  Continue to work on eating a healthy diet and exercise.  Labs drawn today.        PAF (paroxysmal atrial fibrillation) (HCC)    Continue lopressor 100 mg twice daily and eliquis 2.5 mg twice daily.         Digestive   Primary biliary cholangitis (HCC)    Continue ursodiol.      GERD (gastroesophageal reflux disease)    Continue pantoprazole 40 mg once daily.        Endocrine   Diabetes mellitus type 2 without retinopathy ([place]) - Primary    Diabetes worsened.   Recommend Increase  Levemir to 48 units in a.m. and 26 units in p.m.  Recommend call with sugars in about 2 weeks so we can adjust her medicines. Recommend continue to work on eating healthy diet and exercise.      Relevant Orders   Microalbumin / creatinine urine ratio   CKD stage 4 due to type 2 diabetes mellitus ([place])    Diabetes worsened.   Recommend Increase Levemir to 48 units in a.m. and 26 units in p.m.  Recommend call with sugars in about 2 weeks so we can adjust her medicines. Recommend continue to work on eating healthy diet and exercise.          Nervous and Auditory   Bilateral impacted cerumen    irrigation        Musculoskeletal and Integument   Osteoporosis, post-menopausal    Prolia given.        Other   Mixed hyperlipidemia    Well controlled.  No changes to medicines. Continue atorvastatin 40 mg daily.   Continue to work on eating a healthy diet and exercise.  Labs drawn today.        Long term (current) use of insulin ([place])  .  Meds ordered this encounter  Medications    denosumab (PROLIA) injection 60 mg    Order Specific Question:   Patient is enrolled in [facility] program for this medication and I have provided a copy of the Prolia Medication Guide and Patient Brochure.    Answer:   Yes    Order Specific Question:   I have reviewed with the patient the information in the Prolia Medication Guide and Patient Counseling Chart including the serious risks of Prolia and symptoms of each risk.    Answer:   Yes    Order Specific Question:   I have advised the patient to seek medical attention if they have signs or symptoms of any of the serious risks.    Answer:   Yes    Orders Placed This Encounter  Procedures   Microalbumin / creatinine urine ratio     Follow-up: Return in about 3 months (around 01/17/2022) for chronic fasting, awv in next month..  An After Visit Summary was printed and given to the patient.    I,Lauren M Auman,acting as a scribe for Rochel Brome, MD.,have documented all relevant documentation on the behalf of Rochel Brome, MD,as directed by  Rochel Brome, MD while in the presence of Rochel Brome, MD.   Rochel Brome, MD [place] [phone]

## 2021-10-20 DIAGNOSIS — K921 Melena: Secondary | ICD-10-CM | POA: Insufficient documentation

## 2021-10-20 DIAGNOSIS — H6123 Impacted cerumen, bilateral: Secondary | ICD-10-CM | POA: Insufficient documentation

## 2021-10-20 NOTE — Assessment & Plan Note (Signed)
Continue lopressor 100 mg twice daily and eliquis 2.5 mg twice daily.

## 2021-10-20 NOTE — Assessment & Plan Note (Signed)
Continue pantoprazole 40 mg once daily

## 2021-10-20 NOTE — Assessment & Plan Note (Addendum)
Diabetes worsened.   Recommend Increase Levemir to 48 units in a.m. and 26 units in p.m.  Recommend call with sugars in about 2 weeks so we can adjust her medicines. Recommend continue to work on eating healthy diet and exercise.

## 2021-10-20 NOTE — Assessment & Plan Note (Signed)
Continue ursodiol 

## 2021-10-20 NOTE — Assessment & Plan Note (Signed)
Diabetes worsened.   Recommend Increase Levemir to 48 units in a.m. and 26 units in p.m.  Recommend call with sugars in about 2 weeks so we can adjust her medicines. Recommend continue to work on eating healthy diet and exercise.

## 2021-10-20 NOTE — Assessment & Plan Note (Addendum)
Well controlled.  No changes to medicines. Continue atorvastatin 40 mg daily. Continue to work on eating a healthy diet and exercise.  Labs drawn today.   

## 2021-10-20 NOTE — Assessment & Plan Note (Signed)
irrigation

## 2021-10-20 NOTE — Assessment & Plan Note (Signed)
Prolia given

## 2021-10-20 NOTE — Assessment & Plan Note (Signed)
Well controlled.  No changes to medicines. Lopressor 100 mg twice daily, spironolactone 25 mg daily, torsemide 40 mg twice daily.  Continue to work on eating a healthy diet and exercise.  Labs drawn today.

## 2021-10-24 ENCOUNTER — Other Ambulatory Visit: Payer: Self-pay | Admitting: Family Medicine

## 2021-11-04 ENCOUNTER — Encounter: Payer: Self-pay | Admitting: *Deleted

## 2021-11-11 ENCOUNTER — Other Ambulatory Visit: Payer: Medicare Other

## 2021-11-11 DIAGNOSIS — N184 Chronic kidney disease, stage 4 (severe): Secondary | ICD-10-CM | POA: Diagnosis not present

## 2021-11-11 NOTE — Progress Notes (Signed)
THESE ORDERS WERE ORDERED BY DR. Santiago Bumpers AND WILL NEED TO BE FAXED HIS OFFICE AFTER RECEIVED RESULTS.

## 2021-11-13 LAB — RENAL FUNCTION PANEL
Albumin: 3.7 g/dL (ref 3.6–4.6)
BUN/Creatinine Ratio: 34 — ABNORMAL HIGH (ref 12–28)
BUN: 62 mg/dL — ABNORMAL HIGH (ref 8–27)
CO2: 23 mmol/L (ref 20–29)
Calcium: 8.8 mg/dL (ref 8.7–10.3)
Chloride: 100 mmol/L (ref 96–106)
Creatinine, Ser: 1.81 mg/dL — ABNORMAL HIGH (ref 0.57–1.00)
Glucose: 89 mg/dL (ref 70–99)
Phosphorus: 3.2 mg/dL (ref 3.0–4.3)
Potassium: 4.1 mmol/L (ref 3.5–5.2)
Sodium: 140 mmol/L (ref 134–144)
eGFR: 27 mL/min/{1.73_m2} — ABNORMAL LOW (ref >59)

## 2021-11-13 LAB — MICROALBUMIN / CREATININE URINE RATIO
Creatinine, Urine: 72.3 mg/dL
Microalb/Creat Ratio: 37 mg/g creat — ABNORMAL HIGH (ref 0–29)
Microalbumin, Urine: 26.7 ug/mL

## 2021-11-14 NOTE — Progress Notes (Signed)
Liver function normal.  Kidney function abnormal. Improving. spilling protein in urine. Improved. Recommend start kerendia 10 mg daily.  Recheck CMP in 1 month.

## 2021-11-18 ENCOUNTER — Other Ambulatory Visit: Payer: Self-pay

## 2021-11-18 ENCOUNTER — Other Ambulatory Visit: Payer: Self-pay | Admitting: Family Medicine

## 2021-11-18 DIAGNOSIS — I131 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, with stage 1 through stage 4 chronic kidney disease, or unspecified chronic kidney disease: Secondary | ICD-10-CM

## 2021-11-18 MED ORDER — KERENDIA 10 MG PO TABS: 1.0000 | ORAL_TABLET | Freq: Every day | ORAL | 1 refills | Status: DC

## 2021-11-18 NOTE — Telephone Encounter (Signed)
Sara Hancock was approved for insurance on 10/19/2021 and 11/18/2022.

## 2021-11-19 ENCOUNTER — Telehealth: Payer: Self-pay

## 2021-11-19 NOTE — Progress Notes (Signed)
Error

## 2021-11-20 DIAGNOSIS — N184 Chronic kidney disease, stage 4 (severe): Secondary | ICD-10-CM | POA: Diagnosis not present

## 2021-11-20 DIAGNOSIS — D631 Anemia in chronic kidney disease: Secondary | ICD-10-CM | POA: Diagnosis not present

## 2021-11-20 DIAGNOSIS — E785 Hyperlipidemia, unspecified: Secondary | ICD-10-CM | POA: Diagnosis not present

## 2021-11-20 DIAGNOSIS — I129 Hypertensive chronic kidney disease with stage 1 through stage 4 chronic kidney disease, or unspecified chronic kidney disease: Secondary | ICD-10-CM | POA: Diagnosis not present

## 2021-11-20 DIAGNOSIS — I480 Paroxysmal atrial fibrillation: Secondary | ICD-10-CM | POA: Diagnosis not present

## 2021-11-22 ENCOUNTER — Telehealth: Payer: Self-pay

## 2021-11-22 NOTE — Telephone Encounter (Signed)
Called the patient to inform her to go ahead and stop the [place] per Dr. Tobie Poet, and she had already stopped it per Dr. Joylene Grapes.

## 2021-11-22 NOTE — Telephone Encounter (Signed)
-----   Message from Rochel Brome, MD sent at 11/20/2021  1:46 PM EDT ----- Regarding: RE: mutual patient You are correct. Sorry I missed that. Sitlaly Gudiel, could you please Call And tell or to stop kerendia. Thank you, Rochel Brome ----- Message ----- From: Reesa Chew, MD Sent: 11/20/2021   1:26 PM EDT To: Rochel Brome, MD Subject: mutual patient                                 Hi Dr. Tobie Poet,  I am with nephrology and saw Mrs. Dunkley today. I wasn't sure but looked like maybe she was prescribed kerendia. Unfortunately this medication is not meant to be used in conjunction with spironolactone so I would recommend avoiding this medication for now. Let me know if you have any questions.  Best,  Hubert Azure

## 2021-11-25 NOTE — Telephone Encounter (Signed)
Kerendia Dc'd. Will hold off on PAP

## 2021-12-02 ENCOUNTER — Telehealth: Payer: Medicare Other

## 2021-12-04 ENCOUNTER — Telehealth: Payer: Self-pay

## 2021-12-04 DIAGNOSIS — M810 Age-related osteoporosis without current pathological fracture: Secondary | ICD-10-CM | POA: Diagnosis not present

## 2021-12-04 DIAGNOSIS — M85832 Other specified disorders of bone density and structure, left forearm: Secondary | ICD-10-CM | POA: Diagnosis not present

## 2021-12-04 LAB — HM DEXA SCAN

## 2021-12-04 NOTE — Telephone Encounter (Signed)
Received DEXA results: osteoporosis; recommend calcium w/ D 1200 mg daily. Recommend Evenity (2 shots per month)   Spoke w/ patient, patient VU of calcium w/ D. She has been taking prolia for about 2 years. Dr Tobie Poet is aware and believes patient will better benefit from evenity due to worsening of osteoporosis. Patient would like to think about this due to two shots a month.   [name] 12/04/21 4:22 PM

## 2021-12-05 ENCOUNTER — Encounter: Payer: Self-pay | Admitting: Family Medicine

## 2021-12-09 ENCOUNTER — Ambulatory Visit: Payer: Medicare Other | Admitting: Cardiology

## 2021-12-12 ENCOUNTER — Other Ambulatory Visit: Payer: Self-pay

## 2021-12-12 MED ORDER — LEVEMIR FLEXTOUCH 100 UNIT/ML ~~LOC~~ SOPN: PEN_INJECTOR | SUBCUTANEOUS | 1 refills | Status: DC

## 2021-12-13 ENCOUNTER — Ambulatory Visit: Payer: Medicare Other | Admitting: Cardiology

## 2021-12-17 ENCOUNTER — Other Ambulatory Visit: Payer: Self-pay

## 2021-12-17 MED ORDER — LEVEMIR FLEXTOUCH 100 UNIT/ML ~~LOC~~ SOPN: PEN_INJECTOR | SUBCUTANEOUS | 1 refills | Status: DC

## 2021-12-17 NOTE — Telephone Encounter (Signed)
Patient called as she picked up levemir but it was not a 90 day supply. Pharmacist told her to ask for 69 mL which would be 23 pens.   Script resent with this information.   [name], [place] 12/17/21 2:15 PM

## 2021-12-19 ENCOUNTER — Other Ambulatory Visit: Payer: Medicare Other

## 2022-01-07 ENCOUNTER — Ambulatory Visit (INDEPENDENT_AMBULATORY_CARE_PROVIDER_SITE_OTHER): Payer: Medicare Other

## 2022-01-07 DIAGNOSIS — Z0000 Encounter for general adult medical examination without abnormal findings: Secondary | ICD-10-CM

## 2022-01-08 ENCOUNTER — Ambulatory Visit (INDEPENDENT_AMBULATORY_CARE_PROVIDER_SITE_OTHER): Payer: Medicare Other

## 2022-01-08 NOTE — Progress Notes (Signed)
Subjective:   Sara Hancock is a 87 y.o. female who presents for Medicare Annual (Subsequent) preventive examination.    I connected with  KEMPER HOCHMAN on 01/08/22 by a audio enabled telemedicine application and verified that I am speaking with the correct person using two identifiers.  Patient Location: Home  Provider Location: Office/Clinic  I discussed the limitations of evaluation and management by telemedicine. The patient expressed understanding and agreed to proceed.   Cardiac Risk Factors include: advanced age (>62mn, >>57women);diabetes mellitus;dyslipidemia;obesity (BMI >30kg/m2)     Objective:    There were no vitals filed for this visit. There is no height or weight on file to calculate BMI.     01/08/2022    9:50 AM 05/09/2020    1:45 PM 11/27/2017   10:32 AM 05/18/2012    5:35 PM 05/18/2012   10:21 AM 05/10/2012   10:26 AM  Advanced Directives  Does Patient Have a Medical Advance Directive? Yes No Yes Patient does not have advance directive;Patient would not like information  Patient does not have advance directive;Patient would not like information  Type of ANurse, adultPower of ALa JoyaLiving will     Does patient want to make changes to medical advance directive? No - Patient declined       Pre-existing out of facility DNR order (yellow form or pink MOST form)    No No     Current Medications (verified) Outpatient Encounter Medications as of 01/07/2022  Medication Sig   Accu-Chek FastClix Lancets MISC USE TO CHECK BLOOD SUGAR TWICE DAILY   atorvastatin (LIPITOR) 40 MG tablet TAKE 1 TABLET(40 MG) BY MOUTH DAILY   calcium-vitamin D (OSCAL WITH D) 250-125 MG-UNIT per tablet Take 1 tablet by mouth 2 (two) times daily.   Cholecalciferol (VITAMIN D3) 1000 units CAPS Take 1,000 Units by mouth daily.   ELIQUIS 2.5 MG TABS tablet TAKE 1 TABLET(2.5 MG) BY MOUTH TWICE DAILY   fexofenadine (ALLEGRA ALLERGY) 60 MG tablet Take 1 tablet (60 mg  total) by mouth 2 (two) times daily. (Patient taking differently: Take 60 mg by mouth daily.)   Finerenone (KERENDIA) 10 MG TABS Take 1 tablet by mouth daily.   fluticasone (FLONASE) 50 MCG/ACT nasal spray Place 1 spray into both nostrils as needed.   folic acid (FOLVITE) 4381MCG tablet Take 400 mcg by mouth daily.   glucose blood (ACCU-CHEK GUIDE) test strip Use as instructed twice daily. E11.9   insulin detemir (LEVEMIR FLEXTOUCH) 100 UNIT/ML FlexPen INJECT 48 UNITS UNDER THE SKIN EVERY MORNING AND 26 UNITS WITH SUPPER   Insulin Pen Needle (BD PEN NEEDLE NANO 2ND GEN) 32G X 4 MM MISC USE A NEEDLE EACH TIME WHEN CHECKING FBS   metoprolol tartrate (LOPRESSOR) 100 MG tablet TAKE 1 TABLET(100 MG) BY MOUTH TWICE DAILY   Multiple Vitamin (MULTIVITAMIN WITH MINERALS) TABS Take 1 tablet by mouth daily.   nystatin cream (MYCOSTATIN) APPLY SUFFICIENT AMOUNT EXTERNALLY TO THE AFFECTED AREA TWICE DAILY   pantoprazole (PROTONIX) 40 MG tablet TAKE 1 TABLET BY MOUTH EVERY DAY   spironolactone (ALDACTONE) 25 MG tablet Take 25 mg by mouth daily.   torsemide (DEMADEX) 20 MG tablet TAKE 2 TABLETS BY MOUTH TWICE DAILY (Patient taking differently: 20 mg daily.)   ursodiol (ACTIGALL) 300 MG capsule TAKE 1 CAPSULE(300 MG) BY MOUTH TWICE DAILY   vitamin C (ASCORBIC ACID) 500 MG tablet Take 500 mg by mouth daily.   vitamin E 400 UNIT capsule Take 400  Units by mouth daily.   No facility-administered encounter medications on file as of 01/07/2022.    Allergies (verified) Ace inhibitors, Capoten [captopril], and Neomycin-bacitracin zn-polymyx   History: Past Medical History:  Diagnosis Date   Acquired thrombophilia ([place]) 09/11/2019   Allergic rhinitis 09/17/2015   Anemia    ANEMIA, NORMOCYTIC 10/27/2007   Qualifier: Diagnosis of  By: Nils Pyle CMA (AAMA), Leisha     Anticoagulant long-term use 01/08/2011   Arthritis    Benign neoplasm of colon 01/08/2011   Biliary cirrhosis ([place])    BRADYCARDIA 11/15/2008   Qualifier:  Diagnosis of  By: Hollie Salk CMA, Amanda     Cellulitis of left foot 09/25/2016   Cholelithiasis 01/09/12   Chronic kidney disease, stage 3 (HCC)    Combined form of age-related cataract, both eyes 02/26/2017   Diabetes mellitus type 2 without retinopathy ([place]) 10/27/2013   Diverticulosis of colon with hemorrhage 06/08/2000   Dyslipidemia 09/17/2015   Encounter for therapeutic drug monitoring 06/09/2013   Esophageal reflux 01/08/2011   Esophageal stenosis 01/08/11   Esophagitis 01/08/11   GALLSTONES 10/26/2008   Qualifier: Diagnosis of  By: Sharlett Iles MD Byrd Hesselbach    Gastritis, erosive chronic 01/08/2011   GERD (gastroesophageal reflux disease)    Glaucoma suspect of both eyes 02/26/2017   Glaucoma suspect of both eyes 02/26/2017   Hiatal hernia 01/08/11   History of IBS 10/27/2007   Qualifier: Diagnosis of  By: Nils Pyle CMA (AAMA), Mearl Latin     HTN (hypertension)    Hx of adenomatous colonic polyps 01/08/11   Hyperplastic colon polyp 06/08/2000, 07/31/2003, 01/08/11   Hypertension, essential 10/27/2007   Qualifier: Diagnosis of  By: Nils Pyle CMA (AAMA), Mearl Latin     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, with stage 1 through stage 4 chronic kidney disease, or unspecified chronic kidney disease    Iron deficiency anemia 01/08/2011   Left foot pain 09/25/2016   Localized edema    Long term (current) use of insulin (HCC)    Mixed hyperlipidemia    Nuclear cataract 07/03/2011   Obesity    Obesity (BMI 30-39.9) 03/22/2012   Osteoarthritis of right thumb 09/17/2015   Osteopenia    Other iron deficiency anemias    Other specified menopausal and perimenopausal disorders    PAF (paroxysmal atrial fibrillation) (HCC)    occurring in the setting of bradycardia and mild first degree AV block followed by Dr. 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   Qualifier: Diagnosis of  By: Nils Pyle CMA (AAMA), Mearl Latin     Pure hypercholesterolemia    Retinal tear, right 07/03/2011   Shortness of breath 12/13/2019   Stage 3b chronic kidney disease ([place]) 09/11/2019   Type II or unspecified type diabetes mellitus without mention of complication, not stated as uncontrolled    Uterine fibroid 01/09/12   Past Surgical History:  Procedure Laterality Date   BREAST CYST EXCISION     left   CARDIOVERSION N/A 11/27/2017   Procedure: CARDIOVERSION;  Surgeon: Sanda Klein, MD;  Location: [facility];  Service: Cardiovascular;  Laterality: N/A;   COLONOSCOPY     INSERTION OF MESH  05/18/2012   Procedure: INSERTION OF MESH;  Surgeon: Adin Hector, MD;  Location: [facility];  Service: General;  Laterality: N/A;  LIVER BIOPSY  05/18/2012   Procedure: LIVER BIOPSY;  Surgeon: Adin Hector, MD;  Location: [facility];  Service: General;;   POLYPECTOMY     THYROIDECTOMY, [place] HERNIA REPAIR  05/18/2012   Procedure: LAPAROSCOPIC VENTRAL HERNIA;  Surgeon: Adin Hector, MD;  Location: [facility];  Service: General;  Laterality: N/A;  Laparoscopic Ventral Wall Hernia with Mesh   Family History  Problem Relation Age of Onset   Stroke Mother 94   Heart attack Father    Diabetes Sister        x 2   Aneurysm Sister    Stroke Sister    Congestive Heart Failure Sister    Diabetes Brother    Kidney failure Other    Hyperlipidemia Other    Arrhythmia Other    Congestive Heart Failure Other    Hypertension Other    Arthritis Other    Colon cancer Neg Hx    Esophageal cancer Neg Hx    Stomach cancer Neg Hx    Rectal cancer Neg Hx    Social History   Socioeconomic History   Marital status: Married    Spouse name: Not on file   Number of children: 2   Years of education: Not on file   Highest education level: Not on file  Occupational History    Occupation: retired    Fish farm manager: RETIRED  Tobacco Use   Smoking status: Never   Smokeless tobacco: Never  Vaping Use   Vaping Use: Never used  Substance and Sexual Activity   Alcohol use: No   Drug use: No   Sexual activity: Not Currently  Other Topics Concern   Not on file  Social History Narrative   Not on file   Social Determinants of Health   Financial Resource Strain: Low Risk  (08/12/2021)   Overall Financial Resource Strain (CARDIA)    Difficulty of Paying Living Expenses: Not hard at all  Food Insecurity: No Food Insecurity (01/08/2022)   Hunger Vital Sign    Worried About Running Out of Food in the Last Year: Never true    Ran Out of Food in the Last Year: Never true  Transportation Needs: No Transportation Needs (01/08/2022)   PRAPARE - Hydrologist (Medical): No    Lack of Transportation (Non-Medical): No  Physical Activity: Not on file  Stress: Not on file  Social Connections: Not on file    Tobacco Counseling Counseling given: Patient does not use tobacco products   Clinical Intake:  Pre-visit preparation completed: Yes Pain : No/denies pain    BMI - recorded: 31.35 Nutritional Status: BMI > 30  Obese Nutritional Risks: None Diabetes: Yes (last A1C 8.8) CBG done?: No Did pt. bring in CBG monitor from home?: No  How often do you need to have someone help you when you read instructions, pamphlets, or other written materials from your doctor or pharmacy?: 2 - Rarely Interpreter Needed?: No    Activities of Daily Living    01/08/2022    9:51 AM  In your present state of health, do you have any difficulty performing the following activities:  Hearing? 0  Vision? 0  Difficulty concentrating or making decisions? 0  Walking or climbing stairs? 0  Dressing or bathing? 0  Doing errands, shopping? 0  Preparing Food and eating ? N  Using the Toilet? N  In the past six months, have you  accidently leaked urine? N  Do you have  problems with loss of bowel control? N  Managing your Medications? N  Managing your Finances? N  Housekeeping or managing your Housekeeping? N    Patient Care Team: Rochel Brome, MD as PCP - General (Family Medicine) Berniece Salines, DO as PCP - Cardiology (Cardiology) [name], MD as Consulting Physician (Gastroenterology) [name], MD as Consulting Physician (Cardiology) Ladene Artist, MD as Consulting Physician (Gastroenterology) Lane Hacker, [facility] as Pharmacist (Pharmacist) Joylene Grapes Shaune Pollack, MD as Consulting Physician (Internal Medicine) Lane Hacker, [facility] (Pharmacist)     Assessment:   This is a routine wellness examination for [place].  Hearing/Vision screen No results found.  Dietary issues and exercise activities discussed: Current Exercise Habits: Home exercise routine, Type of exercise: walking;stretching;Other - see comments (chair exercises), Time (Minutes): 15, Frequency (Times/Week): 5, Weekly Exercise (Minutes/Week): 75, Intensity: Mild, Exercise limited by: None identified  Depression Screen    01/08/2022    9:49 AM 05/14/2021    2:07 PM 03/28/2021   10:48 AM 12/20/2020   10:34 AM 09/12/2020   10:36 AM 06/07/2020   11:36 AM 05/09/2020    1:45 PM  PHQ 2/9 Scores  PHQ - 2 Score 0 0 0 0 0 0 0    Fall Risk    01/08/2022    9:51 AM 05/14/2021    2:07 PM 03/28/2021   10:47 AM 12/20/2020   10:34 AM 09/12/2020   10:35 AM  Fall Risk   Falls in the past year? 0 0 0 0 0  Number falls in past yr: 0 0 0 0 0  Injury with Fall? 0 0 0 0 0  Risk for fall due to : No Fall Risks   No Fall Risks   Follow up Falls evaluation completed;Falls prevention discussed;Education provided  Falls evaluation completed Falls evaluation completed     FALL RISK PREVENTION PERTAINING TO THE HOME:  Any stairs in or around the home? No  If so, are there any without handrails? No  Home free of loose throw rugs in walkways, pet beds, electrical cords, etc? Yes   Adequate lighting in your home to reduce risk of falls? Yes   ASSISTIVE DEVICES UTILIZED TO PREVENT FALLS:  Life alert? No  Grab bars in the bathroom? Yes  Shower chair or bench in shower? Yes  Elevated toilet seat or a handicapped toilet? No         Immunizations Immunization History  Administered Date(s) Administered   Fluad Quad(high Dose 65+) 03/01/2020   Hep A / Hep B 05/03/2019, 05/10/2019, 08/08/2019, 05/17/2020   Influenza, High Dose Seasonal PF 03/14/2016   Influenza,inj,Quad PF,6+ Mos 02/15/2021   Influenza-Unspecified 03/14/2016, 01/10/2018, 03/28/2021   PFIZER Comirnaty(Gray Top)Covid-19 Tri-Sucrose Vaccine 05/28/2019, 06/18/2019, 02/09/2020, 10/17/2020   PFIZER(Purple Top)SARS-COV-2 Vaccination 05/28/2019, 06/18/2019, 02/09/2020   Pfizer Covid-19 Vaccine Bivalent Booster 66yr & up 02/04/2021, 09/30/2021   Pneumococcal Conjugate-13 10/24/2013   Pneumococcal Polysaccharide-23 05/12/2012, 05/20/2012   Td 05/12/2012   Tdap 03/31/2018   Zoster Recombinat (Shingrix) 02/14/2018, 05/10/2018   Zoster, Live 09/08/2013    TDAP status: Up to date  Flu Vaccine status: Due, Education has been provided regarding the importance of this vaccine. Advised may receive this vaccine at local pharmacy or Health Dept. Aware to provide a copy of the vaccination record if obtained from local pharmacy or Health Dept. Verbalized acceptance and understanding.  Pneumococcal vaccine status: Up to date  Covid-19 vaccine status: Information provided on  how to obtain vaccines.   Qualifies for Shingles Vaccine? Yes   Zostavax completed Yes   Shingrix Completed?: Yes  Screening Tests Health Maintenance  Topic Date Due   OPHTHALMOLOGY EXAM  04/26/2021   COVID-19 Vaccine (10 - Pfizer risk series) 11/25/2021   INFLUENZA VACCINE  12/10/2021   HEMOGLOBIN A1C  04/16/2022   MAMMOGRAM  05/21/2022   FOOT EXAM  07/12/2022   URINE MICROALBUMIN  11/12/2022   TETANUS/TDAP  03/31/2028   Pneumonia  Vaccine 95+ Years old  Completed   DEXA SCAN  Completed   Zoster Vaccines- Shingrix  Completed   HPV VACCINES  Aged Out    Health Maintenance  Health Maintenance Due  Topic Date Due   OPHTHALMOLOGY EXAM  04/26/2021   COVID-19 Vaccine (10 - Pfizer risk series) 11/25/2021   INFLUENZA VACCINE  12/10/2021    Colorectal cancer screening: No longer required.   Mammogram status: Completed 05/21/21. Repeat every year  Bone Density status: Completed 12/04/21. Results reflect: Bone density results: OSTEOPOROSIS. Repeat every 2 years.  Lung Cancer Screening: (Low Dose CT Chest recommended if Age 43-80 years, 30 pack-year currently smoking OR have quit w/in 15years.) does not qualify.    Additional Screening:  Vision Screening: Recommended annual ophthalmology exams for early detection of glaucoma and other disorders of the eye. Is the patient up to date with their annual eye exam?  Yes  Who is the provider or what is the name of the office in which the patient attends annual eye exams? [place]  Dental Screening: Recommended annual dental exams for proper oral hygiene    Plan:    1- Patient plans to get high dose flu vaccine and COVID booster once available 2- Next Prolia injection due in December 3- Continue chair exercises  I have personally reviewed and noted the following in the patient's chart:   Medical and social history Use of alcohol, tobacco or illicit drugs  Current medications and supplements including opioid prescriptions. Patient is not currently taking opioid prescriptions. Functional ability and status Nutritional status Physical activity Advanced directives List of other physicians Hospitalizations, surgeries, and ER visits in previous 12 months Vitals Screenings to include cognitive, depression, and falls Referrals and appointments  In addition, I have reviewed and discussed with patient certain preventive protocols, quality  metrics, and best practice recommendations. A written personalized care plan for preventive services as well as general preventive health recommendations were provided to patient.     Erie Noe, LPN   07/01/2540

## 2022-01-08 NOTE — Patient Instructions (Signed)
Sara Hancock , Thank you for taking time to come for your Medicare Wellness Visit. I appreciate your ongoing commitment to your health goals. Please review the following plan we discussed and let me know if I can assist you in the future.   Screening recommendations/referrals: Mammogram: Due January 2024 if you choose Bone Density: Due July 2025  Recommended yearly ophthalmology/optometry visit for glaucoma screening and checkup Recommended yearly dental visit for hygiene and checkup  Vaccinations: Influenza vaccine: Due Pneumococcal vaccine: Complete Tdap vaccine: Due 2029 Shingles vaccine: Complete    Advanced directives: please bring a copy for your medical record  Conditions/risks identified: Your next Prolia injection will be due in December    Preventive Care 85 Years and Older, Female   Preventive care refers to lifestyle choices and visits with your health care provider that can promote health and wellness.  What does preventive care include? A yearly physical exam. This is also called an annual well check. Dental exams once or twice a year. Routine eye exams. Ask your health care provider how often you should have your eyes checked. Personal lifestyle choices, including: Daily care of your teeth and gums. Regular physical activity. Eating a healthy diet. Avoiding tobacco and drug use. Limiting alcohol use. Practicing safe sex. Taking low-dose aspirin every day. Taking vitamin and mineral supplements as recommended by your health care provider.  What happens during an annual well check? The services and screenings done by your health care provider during your annual well check will depend on your age, overall health, lifestyle risk factors, and family history of disease.  Counseling Your health care provider may ask you questions about your: Alcohol use. Tobacco use. Drug use. Emotional well-being. Home and relationship well-being. Sexual activity. Eating  habits. History of falls. Memory and ability to understand (cognition). Work and work Statistician. Reproductive health.  Screening You may have the following tests or measurements: Height, weight, and BMI. Blood pressure. Lipid and cholesterol levels. These may be checked every 5 years, or more frequently if you are over 85 years old. Skin check. Lung cancer screening. You may have this screening every year starting at age 45 if you have a 30-pack-year history of smoking and currently smoke or have quit within the past 15 years. Fecal occult blood test (FOBT) of the stool. You may have this test every year starting at age 85. Flexible sigmoidoscopy or colonoscopy. You may have a sigmoidoscopy every 5 years or a colonoscopy every 10 years starting at age 24. Hepatitis C blood test. Hepatitis B blood test. Sexually transmitted disease (STD) testing. Diabetes screening. This is done by checking your blood sugar (glucose) after you have not eaten for a while (fasting). You may have this done every 1-3 years. Bone density scan. This is done to screen for osteoporosis. You may have this done starting at age 42. Mammogram. This may be done every 1-2 years. Talk to your health care provider about how often you should have regular mammograms. Talk with your health care provider about your test results, treatment options, and if necessary, the need for more tests.  Vaccines Your health care provider may recommend certain vaccines, such as: Influenza vaccine. This is recommended every year. Tetanus, diphtheria, and acellular pertussis (Tdap, Td) vaccine. You may need a Td booster every 10 years. Zoster vaccine. You may need this after age 109. Pneumococcal 13-valent conjugate (PCV13) vaccine. One dose is recommended after age 37. Pneumococcal polysaccharide (PPSV23) vaccine. One dose is recommended after age 20.  Talk to your health care provider about which screenings and vaccines you need and how  often you need them.  This information is not intended to replace advice given to you by your health care provider. Make sure you discuss any questions you have with your health care provider. Document Released: 05/25/2015 Document Revised: 01/16/2016 Document Reviewed: 02/27/2015 Elsevier Interactive Patient Education  2017 [place] Prevention in the Home  Falls can cause injuries. They can happen to people of all ages. There are many things you can do to make your home safe and to help prevent falls.  What can I do on the outside of my home? Regularly fix the edges of walkways and driveways and fix any cracks. Remove anything that might make you trip as you walk through a door, such as a raised step or threshold. Trim any bushes or trees on the path to your home. Use bright outdoor lighting. Clear any walking paths of anything that might make someone trip, such as rocks or tools. Regularly check to see if handrails are loose or broken. Make sure that both sides of any steps have handrails. Any raised decks and porches should have guardrails on the edges. Have any leaves, snow, or ice cleared regularly. Use sand or salt on walking paths during winter. Clean up any spills in your garage right away. This includes oil or grease spills.  What can I do in the bathroom? Use night lights. Install grab bars by the toilet and in the tub and shower. Do not use towel bars as grab bars. Use non-skid mats or decals in the tub or shower. If you need to sit down in the shower, use a plastic, non-slip stool. Keep the floor dry. Clean up any water that spills on the floor as soon as it happens. Remove soap buildup in the tub or shower regularly. Attach bath mats securely with double-sided non-slip rug tape. Do not have throw rugs and other things on the floor that can make you trip.  What can I do in the bedroom? Use night lights. Make sure that you have a light by your bed that is  easy to reach. Do not use any sheets or blankets that are too big for your bed. They should not hang down onto the floor. Have a firm chair that has side arms. You can use this for support while you get dressed. Do not have throw rugs and other things on the floor that can make you trip.  What can I do in the kitchen? Clean up any spills right away. Avoid walking on wet floors. Keep items that you use a lot in easy-to-reach places. If you need to reach something above you, use a strong step stool that has a grab bar. Keep electrical cords out of the way. Do not use floor polish or wax that makes floors slippery. If you must use wax, use non-skid floor wax. Do not have throw rugs and other things on the floor that can make you trip.  What can I do with my stairs? Do not leave any items on the stairs. Make sure that there are handrails on both sides of the stairs and use them. Fix handrails that are broken or loose. Make sure that handrails are as long as the stairways. Check any carpeting to make sure that it is firmly attached to the stairs. Fix any carpet that is loose or worn. Avoid having throw rugs at the top or bottom  of the stairs. If you do have throw rugs, attach them to the floor with carpet tape. Make sure that you have a light switch at the top of the stairs and the bottom of the stairs. If you do not have them, ask someone to add them for you.  What else can I do to help prevent falls? Wear shoes that: Do not have high heels. Have rubber bottoms. Are comfortable and fit you well. Are closed at the toe. Do not wear sandals. If you use a stepladder: Make sure that it is fully opened. Do not climb a closed stepladder. Make sure that both sides of the stepladder are locked into place. Ask someone to hold it for you, if possible. Clearly mark and make sure that you can see: Any grab bars or handrails. First and last steps. Where the edge of each step is. Use tools that help  you move around (mobility aids) if they are needed. These include: Canes. Walkers. Scooters. Crutches. Turn on the lights when you go into a dark area. Replace any light bulbs as soon as they burn out. Set up your furniture so you have a clear path. Avoid moving your furniture around. If any of your floors are uneven, fix them. If there are any pets around you, be aware of where they are. Review your medicines with your doctor. Some medicines can make you feel dizzy. This can increase your chance of falling. Ask your doctor what other things that you can do to help prevent falls.  This information is not intended to replace advice given to you by your health care provider. Make sure you discuss any questions you have with your health care provider. Document Released: 02/22/2009 Document Revised: 10/04/2015 Document Reviewed: 06/02/2014 Elsevier Interactive Patient Education  2017 [employer].

## 2022-01-09 ENCOUNTER — Other Ambulatory Visit: Payer: Self-pay | Admitting: Family Medicine

## 2022-01-09 ENCOUNTER — Other Ambulatory Visit: Payer: Self-pay | Admitting: Gastroenterology

## 2022-01-15 DIAGNOSIS — L578 Other skin changes due to chronic exposure to nonionizing radiation: Secondary | ICD-10-CM | POA: Diagnosis not present

## 2022-01-15 DIAGNOSIS — L820 Inflamed seborrheic keratosis: Secondary | ICD-10-CM | POA: Diagnosis not present

## 2022-01-15 DIAGNOSIS — D225 Melanocytic nevi of trunk: Secondary | ICD-10-CM | POA: Diagnosis not present

## 2022-01-15 DIAGNOSIS — D2230 Melanocytic nevi of unspecified part of face: Secondary | ICD-10-CM | POA: Diagnosis not present

## 2022-01-15 DIAGNOSIS — I872 Venous insufficiency (chronic) (peripheral): Secondary | ICD-10-CM | POA: Diagnosis not present

## 2022-01-15 DIAGNOSIS — L821 Other seborrheic keratosis: Secondary | ICD-10-CM | POA: Diagnosis not present

## 2022-01-15 DIAGNOSIS — D485 Neoplasm of uncertain behavior of skin: Secondary | ICD-10-CM | POA: Diagnosis not present

## 2022-01-15 DIAGNOSIS — Z85828 Personal history of other malignant neoplasm of skin: Secondary | ICD-10-CM | POA: Diagnosis not present

## 2022-01-15 DIAGNOSIS — D235 Other benign neoplasm of skin of trunk: Secondary | ICD-10-CM | POA: Diagnosis not present

## 2022-01-15 DIAGNOSIS — D2272 Melanocytic nevi of left lower limb, including hip: Secondary | ICD-10-CM | POA: Diagnosis not present

## 2022-01-15 DIAGNOSIS — L858 Other specified epidermal thickening: Secondary | ICD-10-CM | POA: Diagnosis not present

## 2022-01-15 DIAGNOSIS — Z86018 Personal history of other benign neoplasm: Secondary | ICD-10-CM | POA: Diagnosis not present

## 2022-01-16 ENCOUNTER — Telehealth: Payer: Self-pay | Admitting: *Deleted

## 2022-01-16 NOTE — Patient Outreach (Signed)
  Care Coordination   01/16/2022 Name: Sara Hancock MRN: [id number] DOB: 1934-12-15   Care Coordination Outreach Attempts:  An unsuccessful telephone outreach was attempted today to offer the patient information about available care coordination services as a benefit of their health plan.   Follow Up Plan:  Additional outreach attempts will be made to offer the patient care coordination information and services.   Encounter Outcome:  No Answer  Care Coordination Interventions Activated:  No   Care Coordination Interventions:  No, not indicated    Eduard Clos MSW, LCSW Licensed Clinical Social Worker      [phone]

## 2022-01-16 NOTE — Patient Outreach (Signed)
  Care Coordination   Initial Visit Note   01/16/2022 Name: Sara Hancock MRN: [id number] DOB: 14-Jul-1934  Sara Hancock is a 87 y.o. year old female who sees Cox, Kirsten, MD for primary care. I spoke with  Sara Hancock by phone today.  What matters to the patients health and wellness today?  Declines need for Care Coordination at this time. Had AWV last wek per report.     Goals Addressed   None     SDOH assessments and interventions completed:  No     Care Coordination Interventions Activated:  No  Care Coordination Interventions:  No, not indicated   Follow up plan: No further intervention required.   Encounter Outcome:  Pt. Refused

## 2022-01-20 ENCOUNTER — Other Ambulatory Visit: Payer: Medicare Other

## 2022-01-23 ENCOUNTER — Ambulatory Visit: Payer: Medicare Other | Admitting: Family Medicine

## 2022-01-31 ENCOUNTER — Telehealth: Payer: Self-pay

## 2022-01-31 DIAGNOSIS — K745 Biliary cirrhosis, unspecified: Secondary | ICD-10-CM

## 2022-01-31 NOTE — Telephone Encounter (Signed)
Spoke with patient regarding follow up lab work & [place]. Orders placed & schedulers notified. She has been provided scheduling number & aware to call if she has not heard from our schedulers within 1 week.

## 2022-02-10 ENCOUNTER — Other Ambulatory Visit: Payer: Self-pay

## 2022-02-10 ENCOUNTER — Other Ambulatory Visit: Payer: Self-pay | Admitting: Cardiology

## 2022-02-10 ENCOUNTER — Other Ambulatory Visit: Payer: Medicare Other

## 2022-02-10 ENCOUNTER — Other Ambulatory Visit: Payer: Self-pay | Admitting: Family Medicine

## 2022-02-10 DIAGNOSIS — I480 Paroxysmal atrial fibrillation: Secondary | ICD-10-CM

## 2022-02-10 DIAGNOSIS — E119 Type 2 diabetes mellitus without complications: Secondary | ICD-10-CM

## 2022-02-10 DIAGNOSIS — N184 Chronic kidney disease, stage 4 (severe): Secondary | ICD-10-CM | POA: Diagnosis not present

## 2022-02-10 DIAGNOSIS — E782 Mixed hyperlipidemia: Secondary | ICD-10-CM

## 2022-02-10 DIAGNOSIS — E1122 Type 2 diabetes mellitus with diabetic chronic kidney disease: Secondary | ICD-10-CM

## 2022-02-10 NOTE — Telephone Encounter (Signed)
Eliquis 2.'5mg'$  refill request received. Patient is 87 years old, weight-66.7kg, Crea-1.81 on 11/11/2021, Diagnosis-Afib, and last seen by Dr. Harriet Masson on 06/05/2021. Dose is appropriate based on dosing criteria. Will send in refill to requested pharmacy.

## 2022-02-11 LAB — COMPREHENSIVE METABOLIC PANEL
ALT: 18 IU/L (ref 0–32)
AST: 24 IU/L (ref 0–40)
Albumin/Globulin Ratio: 1.1 — ABNORMAL LOW (ref 1.2–2.2)
Albumin: 3.8 g/dL (ref 3.7–4.7)
Alkaline Phosphatase: 214 IU/L — ABNORMAL HIGH (ref 44–121)
BUN/Creatinine Ratio: 34 — ABNORMAL HIGH (ref 12–28)
BUN: 73 mg/dL — ABNORMAL HIGH (ref 8–27)
Bilirubin Total: 0.5 mg/dL (ref 0.0–1.2)
CO2: 27 mmol/L (ref 20–29)
Calcium: 9.6 mg/dL (ref 8.7–10.3)
Chloride: 91 mmol/L — ABNORMAL LOW (ref 96–106)
Creatinine, Ser: 2.16 mg/dL — ABNORMAL HIGH (ref 0.57–1.00)
Globulin, Total: 3.5 g/dL (ref 1.5–4.5)
Glucose: 125 mg/dL — ABNORMAL HIGH (ref 70–99)
Potassium: 3.6 mmol/L (ref 3.5–5.2)
Sodium: 137 mmol/L (ref 134–144)
Total Protein: 7.3 g/dL (ref 6.0–8.5)
eGFR: 22 mL/min/{1.73_m2} — ABNORMAL LOW (ref >59)

## 2022-02-11 LAB — CBC WITH DIFFERENTIAL/PLATELET
Basophils Absolute: 0 10*3/uL (ref 0.0–0.2)
Basos: 0 %
EOS (ABSOLUTE): 0 10*3/uL (ref 0.0–0.4)
Eos: 0 %
Hematocrit: 32.6 % — ABNORMAL LOW (ref 34.0–46.6)
Hemoglobin: 11.1 g/dL (ref 11.1–15.9)
Immature Grans (Abs): 0 10*3/uL (ref 0.0–0.1)
Immature Granulocytes: 0 %
Lymphocytes Absolute: 1.4 10*3/uL (ref 0.7–3.1)
Lymphs: 15 %
MCH: 31 pg (ref 26.6–33.0)
MCHC: 34 g/dL (ref 31.5–35.7)
MCV: 91 fL (ref 79–97)
Monocytes Absolute: 0.8 10*3/uL (ref 0.1–0.9)
Monocytes: 8 %
Neutrophils Absolute: 6.9 10*3/uL (ref 1.4–7.0)
Neutrophils: 77 %
Platelets: 248 10*3/uL (ref 150–450)
RBC: 3.58 x10E6/uL — ABNORMAL LOW (ref 3.77–5.28)
RDW: 13.7 % (ref 11.7–15.4)
WBC: 9.1 10*3/uL (ref 3.4–10.8)

## 2022-02-11 LAB — LIPID PANEL
Chol/HDL Ratio: 2.6 ratio (ref 0.0–4.4)
Cholesterol, Total: 138 mg/dL (ref 100–199)
HDL: 54 mg/dL (ref >39)
LDL Chol Calc (NIH): 67 mg/dL (ref 0–99)
Triglycerides: 92 mg/dL (ref 0–149)
VLDL Cholesterol Cal: 17 mg/dL (ref 5–40)

## 2022-02-11 LAB — CARDIOVASCULAR RISK ASSESSMENT

## 2022-02-11 LAB — HEMOGLOBIN A1C
Est. average glucose Bld gHb Est-mCnc: 189 mg/dL
Hgb A1c MFr Bld: 8.2 % — ABNORMAL HIGH (ref 4.8–5.6)

## 2022-02-11 LAB — PARATHYROID HORMONE, INTACT (NO CA): PTH: 50 pg/mL (ref 15–65)

## 2022-02-12 ENCOUNTER — Encounter: Payer: Self-pay | Admitting: Family Medicine

## 2022-02-12 ENCOUNTER — Other Ambulatory Visit: Payer: Self-pay

## 2022-02-12 DIAGNOSIS — E119 Type 2 diabetes mellitus without complications: Secondary | ICD-10-CM

## 2022-02-12 MED ORDER — ACCU-CHEK FASTCLIX LANCETS MISC: 3 refills | Status: DC

## 2022-02-12 NOTE — Progress Notes (Signed)
Blood count normal.  Liver function normal.  Kidney function abnormal. Fairly stable. Thyroid function normal.  Cholesterol: good. HBA1C:8.8 decreased to 8.2. Improved.  PTH normal.

## 2022-02-13 ENCOUNTER — Encounter: Payer: Self-pay | Admitting: Family Medicine

## 2022-02-13 ENCOUNTER — Ambulatory Visit (INDEPENDENT_AMBULATORY_CARE_PROVIDER_SITE_OTHER): Payer: Medicare Other | Admitting: Family Medicine

## 2022-02-13 ENCOUNTER — Ambulatory Visit: Payer: Medicare Other | Admitting: Family Medicine

## 2022-02-13 VITALS — BP 100/58 | HR 88 | Temp 97.4°F | Resp 14 | Ht 61.0 in | Wt 148.0 lb

## 2022-02-13 DIAGNOSIS — N184 Chronic kidney disease, stage 4 (severe): Secondary | ICD-10-CM

## 2022-02-13 DIAGNOSIS — K219 Gastro-esophageal reflux disease without esophagitis: Secondary | ICD-10-CM | POA: Diagnosis not present

## 2022-02-13 DIAGNOSIS — Z23 Encounter for immunization: Secondary | ICD-10-CM | POA: Diagnosis not present

## 2022-02-13 DIAGNOSIS — K743 Primary biliary cirrhosis: Secondary | ICD-10-CM | POA: Diagnosis not present

## 2022-02-13 DIAGNOSIS — I480 Paroxysmal atrial fibrillation: Secondary | ICD-10-CM

## 2022-02-13 DIAGNOSIS — E782 Mixed hyperlipidemia: Secondary | ICD-10-CM

## 2022-02-13 DIAGNOSIS — D6869 Other thrombophilia: Secondary | ICD-10-CM

## 2022-02-13 DIAGNOSIS — E1122 Type 2 diabetes mellitus with diabetic chronic kidney disease: Secondary | ICD-10-CM

## 2022-02-13 DIAGNOSIS — E538 Deficiency of other specified B group vitamins: Secondary | ICD-10-CM

## 2022-02-13 DIAGNOSIS — D631 Anemia in chronic kidney disease: Secondary | ICD-10-CM | POA: Diagnosis not present

## 2022-02-13 DIAGNOSIS — E119 Type 2 diabetes mellitus without complications: Secondary | ICD-10-CM | POA: Diagnosis not present

## 2022-02-13 DIAGNOSIS — I131 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, with stage 1 through stage 4 chronic kidney disease, or unspecified chronic kidney disease: Secondary | ICD-10-CM | POA: Diagnosis not present

## 2022-02-13 MED ORDER — ACCU-CHEK FASTCLIX LANCETS MISC: 3 refills | Status: DC

## 2022-02-13 NOTE — Patient Instructions (Addendum)
Increase levemir to 50 units in the morning and 28 units in the evening.

## 2022-02-13 NOTE — Progress Notes (Signed)
Subjective:  Patient ID: Sara Hancock, female    DOB: Dec 18, 1934  Age: 87 y.o. MRN: [id number]  Chief Complaint  Patient presents with   Diabetes   Hyperlipidemia   Hypertension    HPI Diabetes:  Complications: Hyperlipidemia Glucose checking: twice a day Glucose logs: 80-100 morning and 140's evenning Hypoglycemia: no Most recent A1C: 8.2% Current medications: Levemir 48 units in the morning and 26 units in the evenning. Last Eye Exam: 2/9/ 2023 Foot checks: daily  Hyperlipidemia: Current medications: Atorvastatin 40 mg daily  Hypertension: Complications: Diabetes. Current medications: Metoprolol 100 mg twice a day, Spironolactone 25 mg daily.  Afib: Taking Eliquis 2.5 mg twice a day  Current Outpatient Medications on File Prior to Visit  Medication Sig Dispense Refill   atorvastatin (LIPITOR) 40 MG tablet TAKE 1 TABLET(40 MG) BY MOUTH DAILY 90 tablet 0   calcium-vitamin D (OSCAL WITH D) 250-125 MG-UNIT per tablet Take 1 tablet by mouth 2 (two) times daily.     Cholecalciferol (VITAMIN D3) 1000 units CAPS Take 1,000 Units by mouth daily.     ELIQUIS 2.5 MG TABS tablet TAKE 1 TABLET(2.5 MG) BY MOUTH TWICE DAILY 180 tablet 1   fexofenadine (ALLEGRA ALLERGY) 180 MG tablet 1 tablet as needed Orally Once a day     fexofenadine (ALLEGRA ALLERGY) 60 MG tablet Take 1 tablet (60 mg total) by mouth 2 (two) times daily. (Patient taking differently: Take 60 mg by mouth daily.) 180 tablet 1   Finerenone (KERENDIA) 10 MG TABS Take 1 tablet by mouth daily. 90 tablet 1   fluticasone (FLONASE) 50 MCG/ACT nasal spray Place 1 spray into both nostrils as needed.     folic acid (FOLVITE) 681 MCG tablet Take 400 mcg by mouth daily.     glucose blood (ACCU-CHEK GUIDE) test strip Use as instructed twice daily. E11.9 200 each 3   insulin detemir (LEVEMIR FLEXTOUCH) 100 UNIT/ML FlexPen INJECT 48 UNITS UNDER THE SKIN EVERY MORNING AND 26 UNITS WITH SUPPER 69 mL 1   Insulin Pen Needle (BD PEN  NEEDLE NANO 2ND GEN) 32G X 4 MM MISC USE A NEEDLE EACH TIME WHEN CHECKING FBS 200 each 3   metoprolol tartrate (LOPRESSOR) 100 MG tablet TAKE 1 TABLET(100 MG) BY MOUTH TWICE DAILY 180 tablet 3   Multiple Vitamin (MULTIVITAMIN WITH MINERALS) TABS Take 1 tablet by mouth daily.     nystatin cream (MYCOSTATIN) APPLY SUFFICIENT AMOUNT EXTERNALLY TO THE AFFECTED AREA TWICE DAILY 30 g 1   pantoprazole (PROTONIX) 40 MG tablet TAKE 1 TABLET BY MOUTH EVERY DAY 90 tablet 1   spironolactone (ALDACTONE) 25 MG tablet Take 25 mg by mouth daily.     torsemide (DEMADEX) 20 MG tablet TAKE 2 TABLETS BY MOUTH TWICE DAILY (Patient taking differently: 20 mg daily.) 360 tablet 0   ursodiol (ACTIGALL) 300 MG capsule TAKE 1 CAPSULE(300 MG) BY MOUTH TWICE DAILY 180 capsule 0   vitamin C (ASCORBIC ACID) 500 MG tablet Take 500 mg by mouth daily.     vitamin E 400 UNIT capsule Take 400 Units by mouth daily.     metolazone (ZAROXOLYN) 5 MG tablet Take 1 tablet (5 mg total) by mouth once a week. 4 tablet 6   No current facility-administered medications on file prior to visit.   Past Medical History:  Diagnosis Date   Acquired thrombophilia ([place]) 09/11/2019   Allergic rhinitis 09/17/2015   Anemia    ANEMIA, NORMOCYTIC 10/27/2007   Qualifier: Diagnosis of  By: Nils Pyle  CMA (AAMA), Leisha     Anticoagulant long-term use 01/08/2011   Arthritis    Benign neoplasm of colon 01/08/2011   Biliary cirrhosis ([place])    BRADYCARDIA 11/15/2008   Qualifier: Diagnosis of  By: Hollie Salk CMA, Amanda     Cellulitis of left foot 09/25/2016   Cholelithiasis 01/09/12   Chronic kidney disease, stage 3 (HCC)    Combined form of age-related cataract, both eyes 02/26/2017   Diabetes mellitus type 2 without retinopathy ([place]) 10/27/2013   Diverticulosis of colon with hemorrhage 06/08/2000   Dyslipidemia 09/17/2015   Encounter for therapeutic drug monitoring 06/09/2013   Esophageal reflux 01/08/2011   Esophageal stenosis 01/08/11   Esophagitis 01/08/11    GALLSTONES 10/26/2008   Qualifier: Diagnosis of  By: Sharlett Iles MD Byrd Hesselbach    Gastritis, erosive chronic 01/08/2011   GERD (gastroesophageal reflux disease)    Glaucoma suspect of both eyes 02/26/2017   Glaucoma suspect of both eyes 02/26/2017   Hiatal hernia 01/08/11   History of IBS 10/27/2007   Qualifier: Diagnosis of  By: Nils Pyle CMA (AAMA), Mearl Latin     HTN (hypertension)    Hx of adenomatous colonic polyps 01/08/11   Hyperplastic colon polyp 06/08/2000, 07/31/2003, 01/08/11   Hypertension, essential 10/27/2007   Qualifier: Diagnosis of  By: Nils Pyle CMA Deborra Medina), Mearl Latin     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, with stage 1 through stage 4 chronic kidney disease, or unspecified chronic kidney disease    Iron deficiency anemia 01/08/2011   Left foot pain 09/25/2016   Localized edema    Long term (current) use of insulin (HCC)    Mixed hyperlipidemia    Nuclear cataract 07/03/2011   Obesity    Obesity (BMI 30-39.9) 03/22/2012   Osteoarthritis of right thumb 09/17/2015   Osteopenia    Other iron deficiency anemias    Other specified menopausal and perimenopausal disorders    PAF (paroxysmal atrial fibrillation) (HCC)    occurring in the setting of bradycardia and mild first degree AV block followed by Dr. Jens Som   Pain in thoracic spine    Paraesophageal hiatal hernia, 3cm by EGD 03/22/2012   Pedal edema 12/13/2019   Persistent atrial fibrillation ([place]) 08/28/2019   Posterior vitreous detachment, bilateral 01/10/2016   Primary biliary cholangitis ([place]) 10/27/2007   Qualifier: Diagnosis of  By: Nils Pyle CMA (AAMA), Mearl Latin     Pure hypercholesterolemia    Retinal tear, right 07/03/2011   Shortness of breath 12/13/2019   Stage 3b chronic kidney disease ([place]) 09/11/2019   Type II or unspecified type diabetes mellitus without mention of complication, not stated as uncontrolled    Uterine fibroid 01/09/12   Past Surgical History:  Procedure Laterality Date   BREAST CYST  EXCISION     left   CARDIOVERSION N/A 11/27/2017   Procedure: CARDIOVERSION;  Surgeon: Sanda Klein, MD;  Location: [facility];  Service: Cardiovascular;  Laterality: N/A;   COLONOSCOPY     INSERTION OF MESH  05/18/2012   Procedure: INSERTION OF MESH;  Surgeon: Adin Hector, MD;  Location: [facility];  Service: General;  Laterality: N/A;   LIVER BIOPSY  05/18/2012   Procedure: LIVER BIOPSY;  Surgeon: Adin Hector, MD;  Location: [facility];  Service: General;;   POLYPECTOMY     THYROIDECTOMY, [place] HERNIA REPAIR  05/18/2012   Procedure: LAPAROSCOPIC VENTRAL HERNIA;  Surgeon: Adin Hector, MD;  Location: [facility];  Service: General;  Laterality: N/A;  Laparoscopic Ventral Wall Hernia with Mesh    Family History  Problem Relation Age of Onset   Stroke Mother 80   Heart attack Father    Diabetes Sister        x 2   Aneurysm Sister    Stroke Sister    Congestive Heart Failure Sister    Diabetes Brother    Kidney failure Other    Hyperlipidemia Other    Arrhythmia Other    Congestive Heart Failure Other    Hypertension Other    Arthritis Other    Colon cancer Neg Hx    Esophageal cancer Neg Hx    Stomach cancer Neg Hx    Rectal cancer Neg Hx    Social History   Socioeconomic History   Marital status: Married    Spouse name: Not on file   Number of children: 2   Years of education: Not on file   Highest education level: Not on file  Occupational History   Occupation: retired    Fish farm manager: RETIRED  Tobacco Use   Smoking status: Never   Smokeless tobacco: Never  Vaping Use   Vaping Use: Never used  Substance and Sexual Activity   Alcohol use: No   Drug use: No   Sexual activity: Not Currently  Other Topics Concern   Not on file  Social History Narrative   Not on file   Social Determinants of Health   Financial Resource Strain: Low Risk  (08/12/2021)   Overall Financial Resource Strain (CARDIA)    Difficulty of Paying Living  Expenses: Not hard at all  Food Insecurity: No Food Insecurity (01/08/2022)   Hunger Vital Sign    Worried About Running Out of Food in the Last Year: Never true    Ran Out of Food in the Last Year: Never true  Transportation Needs: No Transportation Needs (01/08/2022)   PRAPARE - Hydrologist (Medical): No    Lack of Transportation (Non-Medical): No  Physical Activity: Not on file  Stress: Not on file  Social Connections: Not on file    Review of Systems  Constitutional:  Negative for chills, fatigue and fever.  HENT:  Negative for congestion, ear pain and sore throat.   Respiratory:  Negative for cough and shortness of breath.   Cardiovascular:  Negative for chest pain and palpitations.  Gastrointestinal:  Negative for abdominal pain, constipation, diarrhea, nausea and vomiting.  Endocrine: Negative for polydipsia, polyphagia and polyuria.  Genitourinary:  Negative for difficulty urinating and dysuria.  Musculoskeletal:  Negative for arthralgias, back pain and myalgias.  Skin:  Negative for rash.  Neurological:  Negative for headaches.  Psychiatric/Behavioral:  Negative for dysphoric mood. The patient is not nervous/anxious.      Objective:  BP (!) 100/58   Pulse 88   Temp (!) 97.4 F (36.3 C)   Resp 14   Ht '5\' 1"'$  (1.549 m)   Wt 148 lb (67.1 kg)   SpO2 97%   BMI 27.96 kg/m      02/13/2022   11:53 AM 10/17/2021   10:09 AM 09/12/2021    4:42 PM  BP/Weight  Systolic BP 295 284 96  Diastolic BP 58 80 46  Wt. (Lbs) 148 147   BMI 27.96 kg/m2 27.78 kg/m2     Physical Exam Vitals reviewed.  Constitutional:      Appearance: Normal appearance. She is obese.  Neck:     Vascular:  No carotid bruit.  Cardiovascular:     Rate and Rhythm: Normal rate and regular rhythm.     Heart sounds: Normal heart sounds.  Pulmonary:     Effort: Pulmonary effort is normal. No respiratory distress.     Breath sounds: Normal breath sounds.  Abdominal:      General: Abdomen is flat. Bowel sounds are normal.     Palpations: Abdomen is soft.     Tenderness: There is no abdominal tenderness.  Neurological:     Mental Status: She is alert and oriented to person, place, and time.  Psychiatric:        Mood and Affect: Mood normal.        Behavior: Behavior normal.     Diabetic Foot Exam - Simple   No data filed      Lab Results  Component Value Date   WBC 9.1 02/10/2022   HGB 11.1 02/10/2022   HCT 32.6 (L) 02/10/2022   PLT 248 02/10/2022   GLUCOSE 125 (H) 02/10/2022   CHOL 138 02/10/2022   TRIG 92 02/10/2022   HDL 54 02/10/2022   LDLCALC 67 02/10/2022   ALT 18 02/10/2022   AST 24 02/10/2022   NA 137 02/10/2022   K 3.6 02/10/2022   CL 91 (L) 02/10/2022   CREATININE 2.16 (H) 02/10/2022   BUN 73 (H) 02/10/2022   CO2 27 02/10/2022   TSH 2.020 09/10/2020   INR 1.1 (H) 07/30/2021   HGBA1C 8.2 (H) 02/10/2022      Assessment & Plan:   Problem List Items Addressed This Visit       Cardiovascular and Mediastinum   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  No changes to medicines. Metoprolol 100 mg twice a day, Spironolactone 25 mg daily. Continue to work on eating a healthy diet and exercise.       PAF (paroxysmal atrial fibrillation) (HCC)    The current medical regimen is effective;  continue present plan and medications. Taking Eliquis 2.5 mg twice a day        Digestive   Primary biliary cholangitis (HCC)    Continue ursodiol. Management per specialist.        GERD (gastroesophageal reflux disease)    The current medical regimen is effective;  continue present plan and medications. Continue pantoprazole 40 mg daily.         Endocrine   CKD stage 4 due to type 2 diabetes mellitus (HCC)    Control: Improved, but not at goal. Recommend check sugars fasting  daily. Recommend check feet daily. Recommend annual eye exams. Medicines: Increase levemir to 50 units in the morning and 28 units in the evening. Continue to work on eating a healthy diet and exercise.        Relevant Medications   Accu-Chek FastClix Lancets MISC   RESOLVED: Diabetes mellitus type 2 without retinopathy (HCC)    Increase levemir to 50 units in the morning and 28 units in the evening.        Hematopoietic and Hemostatic   Acquired thrombophilia ([place])    On eliquis due to PAF.        Other   Mixed hyperlipidemia    Well controlled.  No changes to medicines. Continue lipitor 40 mg before bed.  Continue to work on eating a healthy diet and exercise.  Labs reviewed.  Anemia due to stage 4 chronic kidney disease (HCC)    Stable. HB good.       Other Visit Diagnoses     Need for immunization against influenza       Relevant Orders   Flu Vaccine QUAD High Dose(Fluad) (Completed)     .  Meds ordered this encounter  Medications   Accu-Chek FastClix Lancets MISC    Sig: CHECKING SUGARS TWICE DAILY.    Dispense:  204 each    Refill:  3    E11.22 DIABETES WITH CKD. ON INSULIN.    Orders Placed This Encounter  Procedures   Flu Vaccine QUAD High Dose(Fluad)    Follow-up: Return in about 3 months (around 05/16/2022) for chronic fasting.  An After Visit Summary was printed and given to the patient.  Rochel Brome, MD [facility] [phone]

## 2022-02-16 NOTE — Assessment & Plan Note (Addendum)
The current medical regimen is effective;  continue present plan and medications. °Continue pantoprazole 40 mg daily  °

## 2022-02-16 NOTE — Assessment & Plan Note (Signed)
The current medical regimen is effective;  continue present plan and medications. Taking Eliquis 2.5 mg twice a day

## 2022-02-16 NOTE — Assessment & Plan Note (Signed)
On eliquis due to PAF.

## 2022-02-16 NOTE — Assessment & Plan Note (Signed)
Increase levemir to 50 units in the morning and 28 units in the evening.

## 2022-02-16 NOTE — Assessment & Plan Note (Signed)
Continue ursodiol. Management per specialist.

## 2022-02-16 NOTE — Assessment & Plan Note (Addendum)
Control: Improved, but not at goal. Recommend check sugars fasting daily. Recommend check feet daily. Recommend annual eye exams. Medicines: Increase levemir to 50 units in the morning and 28 units in the evening. Continue to work on eating a healthy diet and exercise.

## 2022-02-16 NOTE — Assessment & Plan Note (Signed)
Well controlled.  No changes to medicines. Metoprolol 100 mg twice a day, Spironolactone 25 mg daily. Continue to work on eating a healthy diet and exercise.   

## 2022-02-16 NOTE — Assessment & Plan Note (Signed)
Stable. HB good.

## 2022-02-16 NOTE — Assessment & Plan Note (Addendum)
Well controlled.  No changes to medicines. Continue lipitor 40 mg before bed.  Continue to work on eating a healthy diet and exercise.  Labs reviewed.

## 2022-02-17 ENCOUNTER — Telehealth: Payer: Self-pay | Admitting: Family Medicine

## 2022-02-17 DIAGNOSIS — I480 Paroxysmal atrial fibrillation: Secondary | ICD-10-CM | POA: Diagnosis not present

## 2022-02-17 DIAGNOSIS — E785 Hyperlipidemia, unspecified: Secondary | ICD-10-CM | POA: Diagnosis not present

## 2022-02-17 DIAGNOSIS — N184 Chronic kidney disease, stage 4 (severe): Secondary | ICD-10-CM | POA: Diagnosis not present

## 2022-02-17 DIAGNOSIS — D631 Anemia in chronic kidney disease: Secondary | ICD-10-CM | POA: Diagnosis not present

## 2022-02-17 DIAGNOSIS — I129 Hypertensive chronic kidney disease with stage 1 through stage 4 chronic kidney disease, or unspecified chronic kidney disease: Secondary | ICD-10-CM | POA: Diagnosis not present

## 2022-02-17 NOTE — Telephone Encounter (Signed)
Left detailed message informing patient to call us back with the specific name of the medication.

## 2022-02-17 NOTE — Telephone Encounter (Signed)
PT CALLED STATING HER INSURANCE IS REQUESTING MORE INFO FOR MEDICATION TO BE APPROVED. SHE STATED SHE HAS LEFT VOICEMAIL'S AND BE REACHING OUT OVER A WEEK.

## 2022-02-19 ENCOUNTER — Other Ambulatory Visit: Payer: Self-pay

## 2022-02-19 DIAGNOSIS — E1122 Type 2 diabetes mellitus with diabetic chronic kidney disease: Secondary | ICD-10-CM

## 2022-02-19 MED ORDER — ACCU-CHEK FASTCLIX LANCETS MISC: 3 refills | Status: DC

## 2022-02-21 ENCOUNTER — Telehealth: Payer: Self-pay

## 2022-02-21 DIAGNOSIS — E1122 Type 2 diabetes mellitus with diabetic chronic kidney disease: Secondary | ICD-10-CM

## 2022-02-21 MED ORDER — ACCU-CHEK FASTCLIX LANCETS MISC: 3 refills | Status: DC

## 2022-02-22 DIAGNOSIS — I509 Heart failure, unspecified: Secondary | ICD-10-CM | POA: Diagnosis not present

## 2022-02-22 DIAGNOSIS — E049 Nontoxic goiter, unspecified: Secondary | ICD-10-CM | POA: Diagnosis not present

## 2022-02-22 DIAGNOSIS — M4802 Spinal stenosis, cervical region: Secondary | ICD-10-CM | POA: Diagnosis not present

## 2022-02-22 DIAGNOSIS — M47812 Spondylosis without myelopathy or radiculopathy, cervical region: Secondary | ICD-10-CM | POA: Diagnosis not present

## 2022-02-22 DIAGNOSIS — I4891 Unspecified atrial fibrillation: Secondary | ICD-10-CM | POA: Diagnosis not present

## 2022-02-22 DIAGNOSIS — R200 Anesthesia of skin: Secondary | ICD-10-CM | POA: Diagnosis not present

## 2022-02-22 DIAGNOSIS — R202 Paresthesia of skin: Secondary | ICD-10-CM | POA: Diagnosis not present

## 2022-02-24 ENCOUNTER — Other Ambulatory Visit (INDEPENDENT_AMBULATORY_CARE_PROVIDER_SITE_OTHER): Payer: Medicare Other

## 2022-02-24 ENCOUNTER — Encounter: Payer: Self-pay | Admitting: Cardiology

## 2022-02-24 ENCOUNTER — Other Ambulatory Visit: Payer: Self-pay | Admitting: Family Medicine

## 2022-02-24 ENCOUNTER — Ambulatory Visit (HOSPITAL_COMMUNITY)
Admission: RE | Admit: 2022-02-24 | Discharge: 2022-02-24 | Disposition: A | Discharge status: Home / Self Care | Payer: Medicare Other | Source: Ambulatory Visit | Attending: Gastroenterology | Admitting: Gastroenterology

## 2022-02-24 ENCOUNTER — Telehealth: Payer: Self-pay | Admitting: Gastroenterology

## 2022-02-24 ENCOUNTER — Ambulatory Visit (INDEPENDENT_AMBULATORY_CARE_PROVIDER_SITE_OTHER): Payer: Medicare Other | Admitting: Cardiology

## 2022-02-24 VITALS — BP 116/70 | HR 96 | Ht 62.5 in | Wt 149.2 lb

## 2022-02-24 DIAGNOSIS — I480 Paroxysmal atrial fibrillation: Secondary | ICD-10-CM | POA: Insufficient documentation

## 2022-02-24 DIAGNOSIS — E782 Mixed hyperlipidemia: Secondary | ICD-10-CM | POA: Insufficient documentation

## 2022-02-24 DIAGNOSIS — K2100 Gastro-esophageal reflux disease with esophagitis, without bleeding: Secondary | ICD-10-CM

## 2022-02-24 DIAGNOSIS — I131 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, with stage 1 through stage 4 chronic kidney disease, or unspecified chronic kidney disease: Secondary | ICD-10-CM

## 2022-02-24 DIAGNOSIS — D509 Iron deficiency anemia, unspecified: Secondary | ICD-10-CM

## 2022-02-24 DIAGNOSIS — I4821 Permanent atrial fibrillation: Secondary | ICD-10-CM | POA: Diagnosis not present

## 2022-02-24 DIAGNOSIS — R932 Abnormal findings on diagnostic imaging of liver and biliary tract: Secondary | ICD-10-CM | POA: Diagnosis not present

## 2022-02-24 DIAGNOSIS — K745 Biliary cirrhosis, unspecified: Secondary | ICD-10-CM | POA: Diagnosis not present

## 2022-02-24 LAB — CBC WITH DIFFERENTIAL/PLATELET
Basophils Absolute: 0 10*3/uL (ref 0.0–0.1)
Basophils Relative: 0.3 % (ref 0.0–3.0)
Eosinophils Absolute: 0 10*3/uL (ref 0.0–0.7)
Eosinophils Relative: 0.2 % (ref 0.0–5.0)
HCT: 32.3 % — ABNORMAL LOW (ref 36.0–46.0)
Hemoglobin: 10.8 g/dL — ABNORMAL LOW (ref 12.0–15.0)
Lymphocytes Relative: 16.6 % (ref 12.0–46.0)
Lymphs Abs: 1.7 10*3/uL (ref 0.7–4.0)
MCHC: 33.6 g/dL (ref 30.0–36.0)
MCV: 91.9 fl (ref 78.0–100.0)
Monocytes Absolute: 0.8 10*3/uL (ref 0.1–1.0)
Monocytes Relative: 7.7 % (ref 3.0–12.0)
Neutro Abs: 7.8 10*3/uL — ABNORMAL HIGH (ref 1.4–7.7)
Neutrophils Relative %: 75.2 % (ref 43.0–77.0)
Platelets: 243 10*3/uL (ref 150.0–400.0)
RBC: 3.52 Mil/uL — ABNORMAL LOW (ref 3.87–5.11)
RDW: 15.9 % — ABNORMAL HIGH (ref 11.5–15.5)
WBC: 10.4 10*3/uL (ref 4.0–10.5)

## 2022-02-24 LAB — COMPREHENSIVE METABOLIC PANEL
ALT: 19 U/L (ref 0–35)
AST: 27 U/L (ref 0–37)
Albumin: 3.8 g/dL (ref 3.5–5.2)
Alkaline Phosphatase: 191 U/L — ABNORMAL HIGH (ref 39–117)
BUN: 88 mg/dL (ref 6–23)
CO2: 32 mEq/L (ref 19–32)
Calcium: 9.7 mg/dL (ref 8.4–10.5)
Chloride: 93 mEq/L — ABNORMAL LOW (ref 96–112)
Creatinine, Ser: 2.27 mg/dL — ABNORMAL HIGH (ref 0.40–1.20)
GFR: 19.24 mL/min — ABNORMAL LOW (ref >60.00)
Glucose, Bld: 111 mg/dL — ABNORMAL HIGH (ref 70–99)
Potassium: 2.9 mEq/L — ABNORMAL LOW (ref 3.5–5.1)
Sodium: 140 mEq/L (ref 135–145)
Total Bilirubin: 0.6 mg/dL (ref 0.2–1.2)
Total Protein: 8.3 g/dL (ref 6.0–8.3)

## 2022-02-24 LAB — PROTIME-INR
INR: 1.2 ratio — ABNORMAL HIGH (ref 0.8–1.0)
Prothrombin Time: 12.7 s (ref 9.6–13.1)

## 2022-02-24 MED ORDER — POTASSIUM CHLORIDE CRYS ER 20 MEQ PO TBCR: 20.0000 meq | EXTENDED_RELEASE_TABLET | Freq: Two times a day (BID) | ORAL | 0 refills | Status: DC

## 2022-02-24 NOTE — Telephone Encounter (Signed)
Noted  

## 2022-02-24 NOTE — Telephone Encounter (Signed)
Spoke with patient regarding results. Advised that she reach out to her PCP office in regards to kidney function & potassium level. She is aware we will contact her once remaining lab & ultrasound results are available.

## 2022-02-24 NOTE — Patient Instructions (Signed)
Medication Instructions:  Your physician recommends that you continue on your current medications as directed. Please refer to the Current Medication list given to you today.  *If you need a refill on your cardiac medications before your next appointment, please call your pharmacy*   Lab Work: NONE If you have labs (blood work) drawn today and your tests are completely normal, you will receive your results only by: MyChart Message (if you have MyChart) OR A paper copy in the mail If you have any lab test that is abnormal or we need to change your treatment, we will call you to review the results.   Testing/Procedures: NONE   Follow-Up: At [facility], you and your health needs are our priority.  As part of our continuing mission to provide you with exceptional heart care, we have created designated Provider Care Teams.  These Care Teams include your primary Cardiologist (physician) and Advanced Practice Providers (APPs -  Physician Assistants and Nurse Practitioners) who all work together to provide you with the care you need, when you need it.  We recommend signing up for the patient portal called "MyChart".  Sign up information is provided on this After Visit Summary.  MyChart is used to connect with patients for Virtual Visits (Telemedicine).  Patients are able to view lab/test results, encounter notes, upcoming appointments, etc.  Non-urgent messages can be sent to your provider as well.   To learn more about what you can do with MyChart, go to [email or web address].    Your next appointment:   9 month(s)  The format for your next appointment:   In Person  Provider:   Kardie Tobb, DO   

## 2022-02-24 NOTE — Telephone Encounter (Signed)
Agree 

## 2022-02-24 NOTE — Telephone Encounter (Signed)
Hope in the lab called with a Critical BUN of 88 on the patient. Dr Fuller Plan is at the hospital so results given to Dr. Hilarie Fredrickson (DOD) verbally and via message. Dr. Hilarie Fredrickson please advise.

## 2022-02-24 NOTE — Telephone Encounter (Signed)
She has known CKD stage IV in the setting of diabetes Her BUN elevation is not new However her hypokalemia today needs to be addressed and in the setting of renal insufficiency I will defer this to primary care. I cannot tell if she has a nephrologist but if she does they should be involved as well.

## 2022-02-24 NOTE — Progress Notes (Signed)
Cardiology Office Note:    Date:  02/24/2022   ID:  Sara Hancock, DOB 1935/01/13, MRN [id number]  PCP:  Rochel Brome, MD  Cardiologist:  Berniece Salines, DO  Electrophysiologist:  None   Referring MD: Rochel Brome, MD   " I am doing well"  History of Present Illness:    Sara Hancock is a 87 y.o. female with a hx of  atrial fibrillation on Coumadin and metoprolol, diastolic heart failure, hypertension presents today for follow-up visit.     I saw the patient on March 26, 2020 at that time we kept her on her for some mild twice daily.  But in the interim I have given the patient some metolazone due to bilateral leg edema.  She is here today for follow-up visit.   I saw the patient on June 26, 2020 at that time she appeared to be doing well from a cardiovascular standpoint.  She was at her baseline.  We did blood work with electrolytes.  And she continue to follow with our Coumadin clinic.   At her visit on Sep 25, 2020 the patient had transitioned to Eliquis off Coumadin.  She seems to be happy with that decision.  At that visit she was doing well from a cardiovascular standpoint.  No medication changes were made.  I saw the patient on November 30,2022 at that time she had been hospitalized in the [place] her diuretics was also decreased by her nephrologist.  During her visit she did have significant leg edema I started the patient on metolazone for several days and then defer to her nephrologist for any further diuretic optimization given her chronic kidney disease.  I saw the patient on 06/05/2021 at that time she was doing well from a cardiovascular standpoint no changes were made.  She has been hospitalized for acute exacerbation of heart failure because her diuretics regimen has been changed.   No chest pain.  Baseline shortness of breath .   Past Medical History:  Diagnosis Date   Acquired thrombophilia ([place]) 09/11/2019   Allergic rhinitis 09/17/2015   Anemia     ANEMIA, NORMOCYTIC 10/27/2007   Qualifier: Diagnosis of  By: Nils Pyle CMA (AAMA), Leisha     Anticoagulant long-term use 01/08/2011   Arthritis    Benign neoplasm of colon 01/08/2011   Biliary cirrhosis ([place])    BRADYCARDIA 11/15/2008   Qualifier: Diagnosis of  By: Hollie Salk CMA, Amanda     Cellulitis of left foot 09/25/2016   Cholelithiasis 01/09/12   Chronic kidney disease, stage 3 (HCC)    Combined form of age-related cataract, both eyes 02/26/2017   Diabetes mellitus type 2 without retinopathy ([place]) 10/27/2013   Diverticulosis of colon with hemorrhage 06/08/2000   Dyslipidemia 09/17/2015   Encounter for therapeutic drug monitoring 06/09/2013   Esophageal reflux 01/08/2011   Esophageal stenosis 01/08/11   Esophagitis 01/08/11   GALLSTONES 10/26/2008   Qualifier: Diagnosis of  By: Sharlett Iles MD Byrd Hesselbach    Gastritis, erosive chronic 01/08/2011   GERD (gastroesophageal reflux disease)    Glaucoma suspect of both eyes 02/26/2017   Glaucoma suspect of both eyes 02/26/2017   Hiatal hernia 01/08/11   History of IBS 10/27/2007   Qualifier: Diagnosis of  By: Nils Pyle CMA (AAMA), Leisha     HTN (hypertension)    Hx of adenomatous colonic polyps 01/08/11   Hyperplastic colon polyp 06/08/2000, 07/31/2003, 01/08/11   Hypertension, essential 10/27/2007   Qualifier: Diagnosis of  By: Nils Pyle CMA (AAMA), Mearl Latin  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, with stage 1 through stage 4 chronic kidney disease, or unspecified chronic kidney disease    Iron deficiency anemia 01/08/2011   Left foot pain 09/25/2016   Localized edema    Long term (current) use of insulin (HCC)    Mixed hyperlipidemia    Nuclear cataract 07/03/2011   Obesity    Obesity (BMI 30-39.9) 03/22/2012   Osteoarthritis of right thumb 09/17/2015   Osteopenia    Other iron deficiency anemias    Other specified menopausal and perimenopausal disorders    PAF (paroxysmal atrial fibrillation) (HCC)    occurring in the setting  of bradycardia and mild first degree AV block followed by Dr. 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   Qualifier: Diagnosis of  By: Nils Pyle CMA (AAMA), Mearl Latin     Pure hypercholesterolemia    Retinal tear, right 07/03/2011   Shortness of breath 12/13/2019   Stage 3b chronic kidney disease ([place]) 09/11/2019   Type II or unspecified type diabetes mellitus without mention of complication, not stated as uncontrolled    Uterine fibroid 01/09/12    Past Surgical History:  Procedure Laterality Date   BREAST CYST EXCISION     left   CARDIOVERSION N/A 11/27/2017   Procedure: CARDIOVERSION;  Surgeon: Sanda Klein, MD;  Location: [facility];  Service: Cardiovascular;  Laterality: N/A;   COLONOSCOPY     INSERTION OF MESH  05/18/2012   Procedure: INSERTION OF MESH;  Surgeon: Adin Hector, MD;  Location: [facility];  Service: General;  Laterality: N/A;   LIVER BIOPSY  05/18/2012   Procedure: LIVER BIOPSY;  Surgeon: Adin Hector, MD;  Location: [facility];  Service: General;;   POLYPECTOMY     THYROIDECTOMY, [place]  05/18/2012   Procedure: LAPAROSCOPIC VENTRAL HERNIA;  Surgeon: Adin Hector, MD;  Location: [facility];  Service: General;  Laterality: N/A;  Laparoscopic Ventral Wall Hernia with Mesh    Current Medications: Current Meds  Medication Sig   Accu-Chek FastClix Lancets MISC CHECKING SUGARS TWICE DAILY.   atorvastatin (LIPITOR) 40 MG tablet TAKE 1 TABLET(40 MG) BY MOUTH DAILY   calcium-vitamin D (OSCAL WITH D) 250-125 MG-UNIT per tablet Take 1 tablet by mouth 2 (two) times daily.   Cholecalciferol (VITAMIN D3) 1000 units CAPS Take 1,000 Units by mouth daily.   ELIQUIS 2.5 MG TABS tablet TAKE 1 TABLET(2.5 MG) BY MOUTH TWICE DAILY    fexofenadine (ALLEGRA ALLERGY) 180 MG tablet 1 tablet as needed Orally Once a day   fexofenadine (ALLEGRA ALLERGY) 60 MG tablet Take 1 tablet (60 mg total) by mouth 2 (two) times daily. (Patient taking differently: Take 60 mg by mouth daily.)   fluticasone (FLONASE) 50 MCG/ACT nasal spray Place 1 spray into both nostrils as needed.   folic acid (FOLVITE) 893 MCG tablet Take 400 mcg by mouth daily.   glucose blood (ACCU-CHEK GUIDE) test strip Use as instructed twice daily. E11.9   insulin detemir (LEVEMIR FLEXTOUCH) 100 UNIT/ML FlexPen INJECT 48 UNITS UNDER THE SKIN EVERY MORNING AND 26 UNITS WITH SUPPER   Insulin Pen Needle (BD PEN NEEDLE NANO 2ND GEN) 32G X 4 MM MISC USE A NEEDLE EACH TIME  WHEN CHECKING FBS   metolazone (ZAROXOLYN) 5 MG tablet Take 1 tablet (5 mg total) by mouth once a week.   metoprolol tartrate (LOPRESSOR) 100 MG tablet TAKE 1 TABLET(100 MG) BY MOUTH TWICE DAILY   Multiple Vitamin (MULTIVITAMIN WITH MINERALS) TABS Take 1 tablet by mouth daily.   nystatin cream (MYCOSTATIN) APPLY SUFFICIENT AMOUNT EXTERNALLY TO THE AFFECTED AREA TWICE DAILY   pantoprazole (PROTONIX) 40 MG tablet TAKE 1 TABLET BY MOUTH EVERY DAY   spironolactone (ALDACTONE) 25 MG tablet Take 25 mg by mouth daily.   torsemide (DEMADEX) 20 MG tablet TAKE 2 TABLETS BY MOUTH TWICE DAILY (Patient taking differently: 20 mg daily.)   ursodiol (ACTIGALL) 300 MG capsule TAKE 1 CAPSULE(300 MG) BY MOUTH TWICE DAILY   vitamin C (ASCORBIC ACID) 500 MG tablet Take 500 mg by mouth daily.   vitamin E 400 UNIT capsule Take 400 Units by mouth daily.     Allergies:   Ace inhibitors, Capoten [captopril], and Neomycin-bacitracin zn-polymyx   Social History   Socioeconomic History   Marital status: Married    Spouse name: Not on file   Number of children: 2   Years of education: Not on file   Highest education level: Not on file  Occupational History   Occupation: retired    Fish farm manager: RETIRED  Tobacco Use   Smoking  status: Never   Smokeless tobacco: Never  Vaping Use   Vaping Use: Never used  Substance and Sexual Activity   Alcohol use: No   Drug use: No   Sexual activity: Not Currently  Other Topics Concern   Not on file  Social History Narrative   Not on file   Social Determinants of Health   Financial Resource Strain: Low Risk  (08/12/2021)   Overall Financial Resource Strain (CARDIA)    Difficulty of Paying Living Expenses: Not hard at all  Food Insecurity: No Food Insecurity (01/08/2022)   Hunger Vital Sign    Worried About Running Out of Food in the Last Year: Never true    [place] in the Last Year: Never true  Transportation Needs: No Transportation Needs (01/08/2022)   PRAPARE - Hydrologist (Medical): No    Lack of Transportation (Non-Medical): No  Physical Activity: Not on file  Stress: Not on file  Social Connections: Not on file     Family History: The patient's family history includes Aneurysm in her sister; Arrhythmia in an other family member; Arthritis in an other family member; Congestive Heart Failure in her sister and another family member; Diabetes in her brother and sister; Heart attack in her father; Hyperlipidemia in an other family member; Hypertension in an other family member; Kidney failure in an other family member; Stroke in her sister; Stroke (age of onset: 24) in her mother. There is no history of Colon cancer, Esophageal cancer, Stomach cancer, or Rectal cancer.  ROS:   Review of Systems  Constitution: Negative for decreased appetite, fever and weight gain.  HENT: Negative for congestion, ear discharge, hoarse voice and sore throat.   Eyes: Negative for discharge, redness, vision loss in right eye and visual halos.  Cardiovascular: Negative for chest pain, dyspnea on exertion, leg swelling, orthopnea and palpitations.  Respiratory: Negative for cough, hemoptysis, shortness of breath and snoring.   Endocrine: Negative for  heat intolerance and polyphagia.  Hematologic/Lymphatic: Negative for bleeding problem. Does not bruise/bleed easily.  Skin: Negative for flushing, nail changes, rash and suspicious lesions.  Musculoskeletal: Negative for arthritis, joint pain, muscle cramps, myalgias, neck pain and stiffness.  Gastrointestinal: Negative for abdominal pain, bowel incontinence, diarrhea and excessive appetite.  Genitourinary: Negative for decreased libido, genital sores and incomplete emptying.  Neurological: Negative for brief paralysis, focal weakness, headaches and loss of balance.  Psychiatric/Behavioral: Negative for altered mental status, depression and suicidal ideas.  Allergic/Immunologic: Negative for HIV exposure and persistent infections.    EKGs/Labs/Other Studies Reviewed:    The following studies were reviewed today:   EKG: atrial fibrillation with 90s beats per minute.  Recent Labs: 06/05/2021: Magnesium 2.1 02/24/2022: ALT 19; BUN 88; Creatinine, Ser 2.27; Hemoglobin 10.8; Platelets 243.0; Potassium 2.9; Sodium 140  Recent Lipid Panel    Component Value Date/Time   CHOL 138 02/10/2022 1223   TRIG 92 02/10/2022 1223   HDL 54 02/10/2022 1223   CHOLHDL 2.6 02/10/2022 1223   LDLCALC 67 02/10/2022 1223    Physical Exam:    VS:  BP 116/70   Pulse 96   Ht 5' 2.5" (1.588 m)   Wt 149 lb 3.2 oz (67.7 kg)   SpO2 96%   BMI 26.85 kg/m     Wt Readings from Last 3 Encounters:  02/24/22 149 lb 3.2 oz (67.7 kg)  02/13/22 148 lb (67.1 kg)  10/17/21 147 lb (66.7 kg)     GEN: Well nourished, well developed in no acute distress HEENT: Normal NECK: No JVD; No carotid bruits LYMPHATICS: No lymphadenopathy CARDIAC: S1S2 noted,RRR, no murmurs, rubs, gallops RESPIRATORY:  Clear to auscultation without rales, wheezing or rhonchi  ABDOMEN: Soft, non-tender, non-distended, +bowel sounds, no guarding. EXTREMITIES: No edema, No cyanosis, no clubbing MUSCULOSKELETAL:  No deformity  SKIN: Warm  and dry NEUROLOGIC:  Alert and oriented x 3, non-focal PSYCHIATRIC:  Normal affect, good insight  ASSESSMENT:    1. Permanent atrial fibrillation ([place])   2. PAF (paroxysmal atrial fibrillation) ([place])   3. 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 Mixed hyperlipidemia     PLAN:    Clinically she appears to be euvolemic.  She is now taking the torsemide 20 mg twice a day.  We will keep her on that.  In the meantime we will give the patient metolazone once weekly hoping this will be okay with my nephrology colleagues.  We will get blood work today to assess kidney function.  I also have discussed with the patient on how to use this medication.  She expresses understanding.  Permanent atrial fibrillation-continue current medication regimen.  Chronic and states she is continue to monitor creatinine.  Blood pressure is acceptable, continue with current antihypertensive regimen.   The patient is in agreement with the above plan. The patient left the office in stable condition.  The patient will follow up in 6 months or sooner if needed.   Medication Adjustments/Labs and Tests Ordered: Current medicines are reviewed at length with the patient today.  Concerns regarding medicines are outlined above.  Orders Placed This Encounter  Procedures   EKG 12-Lead   No orders of the defined types were placed in this encounter.   Patient Instructions  Medication Instructions:  Your physician recommends that you continue on your current medications as directed. Please refer to the Current Medication list given to you today.  *If you need a refill on your cardiac medications before your next appointment, please call your pharmacy*   Lab Work:  NONE If you have labs (blood work) drawn today and your tests are completely normal, you will receive your results only by: [place] (if you have MyChart) OR A paper copy in the mail If you have any lab test that is abnormal or we need to change your treatment, we will call you to review the results.   Testing/Procedures: NONE   Follow-Up: At [facility], you and your health needs are our priority.  As part of our continuing mission to provide you with exceptional heart care, we have created designated Provider Care Teams.  These Care Teams include your primary Cardiologist (physician) and Advanced Practice Providers (APPs -  Physician Assistants and Nurse Practitioners) who all work together to provide you with the care you need, when you need it.  We recommend signing up for the patient portal called "MyChart".  Sign up information is provided on this After Visit Summary.  MyChart is used to connect with patients for Virtual Visits (Telemedicine).  Patients are able to view lab/test results, encounter notes, upcoming appointments, etc.  Non-urgent messages can be sent to your provider as well.   To learn more about what you can do with MyChart, go to [email or web address].    Your next appointment:   9 month(s)  The format for your next appointment:   In Person  Provider:   Berniece Salines, DO     Adopting a Healthy Lifestyle.  Know what a healthy weight is for you (roughly BMI <25) and aim to maintain this   Aim for 7+ servings of fruits and vegetables daily   65-80+ fluid ounces of water or unsweet tea for healthy kidneys   Limit to max 1 drink of alcohol per day; avoid smoking/tobacco   Limit animal fats in diet for cholesterol and heart health - choose grass fed whenever available   Avoid highly processed foods, and foods high in saturated/trans fats   Aim for low stress - take time to unwind and care for your mental health   Aim for 150 min of moderate intensity exercise weekly for heart health, and weights twice weekly for bone health   Aim for 7-9 hours of sleep daily   When it  comes to diets, agreement about the perfect plan isnt easy to find, even among the experts. Experts at the [employer] developed an idea known as the Healthy Eating Plate. Just imagine a plate divided into logical, healthy portions.   The emphasis is on diet quality:   Load up on vegetables and fruits - one-half of your plate: Aim for color and variety, and remember that potatoes dont count.   Go for whole grains - one-quarter of your plate: Whole wheat, barley, wheat berries, quinoa, oats, brown rice, and foods made with them. If you want pasta, go with whole wheat pasta.   Protein power - one-quarter of your plate: Fish, chicken, beans, and nuts are all healthy, versatile protein sources. Limit red meat.   The diet, however, does go beyond the plate, offering a few other suggestions.   Use healthy plant oils, such as olive, canola, soy, corn, sunflower and peanut. Check the labels, and avoid partially hydrogenated oil, which have unhealthy trans fats.   If youre thirsty, drink water. Coffee and tea are good in moderation, but skip sugary drinks and limit milk and dairy products to one or two daily servings.   The type of carbohydrate in the diet is more important than the  amount. Some sources of carbohydrates, such as vegetables, fruits, whole grains, and beans-are healthier than others.   Finally, stay active  Signed, Berniece Salines, DO  02/24/2022 2:17 PM    [facility]

## 2022-02-25 NOTE — Telephone Encounter (Signed)
Patient made aware, verbalized Understanding 

## 2022-02-26 ENCOUNTER — Ambulatory Visit (INDEPENDENT_AMBULATORY_CARE_PROVIDER_SITE_OTHER): Payer: Medicare Other

## 2022-02-26 ENCOUNTER — Ambulatory Visit: Payer: Medicare Other | Admitting: Cardiology

## 2022-02-26 DIAGNOSIS — Z23 Encounter for immunization: Secondary | ICD-10-CM | POA: Diagnosis not present

## 2022-02-26 LAB — AFP TUMOR MARKER: AFP-Tumor Marker: 1.7 ng/mL

## 2022-02-26 NOTE — Telephone Encounter (Signed)
error 

## 2022-02-28 ENCOUNTER — Telehealth: Payer: Self-pay

## 2022-02-28 NOTE — Telephone Encounter (Signed)
        Patient  visited [facility]  on 10/14    Telephone encounter attempt :  1st  A HIPAA compliant voice message was left requesting a return call.  Instructed patient to call back.    [facility], Care Management  [phone], [place], [place] [address] Phone: [phone] Email: [email or web address]

## 2022-03-05 ENCOUNTER — Other Ambulatory Visit: Payer: Self-pay

## 2022-03-05 ENCOUNTER — Other Ambulatory Visit: Payer: Medicare Other

## 2022-03-05 DIAGNOSIS — E876 Hypokalemia: Secondary | ICD-10-CM

## 2022-03-06 ENCOUNTER — Other Ambulatory Visit: Payer: Self-pay

## 2022-03-06 LAB — COMPREHENSIVE METABOLIC PANEL
ALT: 21 IU/L (ref 0–32)
AST: 28 IU/L (ref 0–40)
Albumin/Globulin Ratio: 1.1 — ABNORMAL LOW (ref 1.2–2.2)
Albumin: 3.8 g/dL (ref 3.7–4.7)
Alkaline Phosphatase: 235 IU/L — ABNORMAL HIGH (ref 44–121)
BUN/Creatinine Ratio: 36 — ABNORMAL HIGH (ref 12–28)
BUN: 84 mg/dL (ref 8–27)
Bilirubin Total: 0.6 mg/dL (ref 0.0–1.2)
CO2: 29 mmol/L (ref 20–29)
Calcium: 10.1 mg/dL (ref 8.7–10.3)
Chloride: 96 mmol/L (ref 96–106)
Creatinine, Ser: 2.32 mg/dL — ABNORMAL HIGH (ref 0.57–1.00)
Globulin, Total: 3.5 g/dL (ref 1.5–4.5)
Glucose: 66 mg/dL — ABNORMAL LOW (ref 70–99)
Potassium: 4.2 mmol/L (ref 3.5–5.2)
Sodium: 142 mmol/L (ref 134–144)
Total Protein: 7.3 g/dL (ref 6.0–8.5)
eGFR: 20 mL/min/{1.73_m2} — ABNORMAL LOW (ref >59)

## 2022-03-06 MED ORDER — LEVEMIR FLEXTOUCH 100 UNIT/ML ~~LOC~~ SOPN: PEN_INJECTOR | SUBCUTANEOUS | 1 refills | Status: DC

## 2022-03-20 ENCOUNTER — Other Ambulatory Visit: Payer: Self-pay

## 2022-03-20 MED ORDER — POTASSIUM CHLORIDE CRYS ER 20 MEQ PO TBCR: 20.0000 meq | EXTENDED_RELEASE_TABLET | Freq: Two times a day (BID) | ORAL | 0 refills | Status: DC

## 2022-03-21 ENCOUNTER — Other Ambulatory Visit: Payer: Self-pay

## 2022-03-21 DIAGNOSIS — E1122 Type 2 diabetes mellitus with diabetic chronic kidney disease: Secondary | ICD-10-CM | POA: Diagnosis not present

## 2022-03-21 DIAGNOSIS — N1832 Chronic kidney disease, stage 3b: Secondary | ICD-10-CM | POA: Diagnosis not present

## 2022-03-21 DIAGNOSIS — I482 Chronic atrial fibrillation, unspecified: Secondary | ICD-10-CM | POA: Diagnosis not present

## 2022-03-21 DIAGNOSIS — I10 Essential (primary) hypertension: Secondary | ICD-10-CM | POA: Diagnosis not present

## 2022-03-21 DIAGNOSIS — I509 Heart failure, unspecified: Secondary | ICD-10-CM | POA: Diagnosis not present

## 2022-03-21 DIAGNOSIS — N189 Chronic kidney disease, unspecified: Secondary | ICD-10-CM | POA: Diagnosis not present

## 2022-03-21 DIAGNOSIS — I129 Hypertensive chronic kidney disease with stage 1 through stage 4 chronic kidney disease, or unspecified chronic kidney disease: Secondary | ICD-10-CM | POA: Diagnosis not present

## 2022-03-21 DIAGNOSIS — R0789 Other chest pain: Secondary | ICD-10-CM | POA: Diagnosis not present

## 2022-03-21 MED ORDER — POTASSIUM CHLORIDE CRYS ER 20 MEQ PO TBCR: 20.0000 meq | EXTENDED_RELEASE_TABLET | Freq: Two times a day (BID) | ORAL | 0 refills | Status: DC

## 2022-03-24 ENCOUNTER — Other Ambulatory Visit: Payer: Self-pay | Admitting: Family Medicine

## 2022-03-24 ENCOUNTER — Telehealth: Payer: Self-pay

## 2022-03-24 DIAGNOSIS — I152 Hypertension secondary to endocrine disorders: Secondary | ICD-10-CM

## 2022-03-24 MED ORDER — LEVEMIR FLEXTOUCH 100 UNIT/ML ~~LOC~~ SOPN: PEN_INJECTOR | SUBCUTANEOUS | 1 refills | Status: DC

## 2022-03-24 MED ORDER — AMLODIPINE BESYLATE 2.5 MG PO TABS: 2.5000 mg | ORAL_TABLET | Freq: Every day | ORAL | 0 refills | Status: DC

## 2022-03-24 NOTE — Telephone Encounter (Signed)
Sara Hancock called to report that her bp has been running high over the weekend.  Her bp on Friday was 166/113 pulse 82.  Her bp Saturday and Sunday was 141/96, pulse 90-93.  Dr. Tobie Poet was informed. Amlodipine 2.5 mg daily was added to her regime.  She is going to continue to monitor her bp twice daily.  She was instruced to call us back on Friday morning with her readings.

## 2022-03-27 ENCOUNTER — Telehealth: Payer: Self-pay | Admitting: *Deleted

## 2022-03-27 NOTE — Telephone Encounter (Signed)
      Patient  visit on 03/22/2022  at [facility] ed  was for treatment   Have you been able to follow up with your primary care physician? Yes changed medicine  The patient was able to obtain any needed medicine or equipment.  Are there diet recommendations that you are having difficulty following?  Patient expresses understanding of discharge instructions and education provided has no other needs at this time.    [place] [phone] [address] , [place] [address] Email : [email or web address]

## 2022-04-07 ENCOUNTER — Telehealth: Payer: Self-pay

## 2022-04-07 NOTE — Telephone Encounter (Signed)
Patient left a voicemail returning Caliana's call stating her blood readings have been 130's over 80's and pulse rate in the 80's.

## 2022-04-08 NOTE — Telephone Encounter (Signed)
Patient was notified to continue current regime.

## 2022-04-11 ENCOUNTER — Other Ambulatory Visit: Payer: Self-pay | Admitting: Family Medicine

## 2022-04-11 ENCOUNTER — Ambulatory Visit (INDEPENDENT_AMBULATORY_CARE_PROVIDER_SITE_OTHER): Payer: Medicare Other | Admitting: Family Medicine

## 2022-04-11 VITALS — BP 110/74 | HR 72 | Temp 96.4°F | Resp 18 | Ht 62.5 in | Wt 153.2 lb

## 2022-04-11 DIAGNOSIS — R051 Acute cough: Secondary | ICD-10-CM

## 2022-04-11 DIAGNOSIS — J0100 Acute maxillary sinusitis, unspecified: Secondary | ICD-10-CM

## 2022-04-11 DIAGNOSIS — Z1231 Encounter for screening mammogram for malignant neoplasm of breast: Secondary | ICD-10-CM

## 2022-04-11 LAB — POC COVID19 BINAXNOW

## 2022-04-11 MED ORDER — AMOXICILLIN-POT CLAVULANATE 875-125 MG PO TABS: 1.0000 | ORAL_TABLET | Freq: Two times a day (BID) | ORAL | 0 refills | Status: DC

## 2022-04-11 NOTE — Progress Notes (Signed)
Acute Office Visit  Subjective:    Patient ID: Sara Hancock, female    DOB: 09/24/1934, 87 y.o.   MRN: [id number]  Chief Complaint  Patient presents with  . Cough    HPI: Patient is in today for congested cough which started on Wednesday.  She denies fever or chills. Little shortness of breath.   Past Medical History:  Diagnosis Date  . Acquired thrombophilia ([place]) 09/11/2019  . Allergic rhinitis 09/17/2015  . Anemia   . ANEMIA, NORMOCYTIC 10/27/2007   Qualifier: Diagnosis of  By: Nils Pyle CMA (AAMA), Mearl Latin    . Anticoagulant long-term use 01/08/2011  . Arthritis   . Benign neoplasm of colon 01/08/2011  . Biliary cirrhosis ([place])   . BRADYCARDIA 11/15/2008   Qualifier: Diagnosis of  By: Hollie Salk CMA, Estill Bamberg    . Cellulitis of left foot 09/25/2016  . Cholelithiasis 01/09/12  . Chronic kidney disease, stage 3 ([place])   . Combined form of age-related cataract, both eyes 02/26/2017  . Diabetes mellitus type 2 without retinopathy ([place]) 10/27/2013  . Diverticulosis of colon with hemorrhage 06/08/2000  . Dyslipidemia 09/17/2015  . Encounter for therapeutic drug monitoring 06/09/2013  . Esophageal reflux 01/08/2011  . Esophageal stenosis 01/08/11  . Esophagitis 01/08/11  . GALLSTONES 10/26/2008   Qualifier: Diagnosis of  By: Sharlett Iles MD Byrd Hesselbach Gastritis, erosive chronic 01/08/2011  . GERD (gastroesophageal reflux disease)   . Glaucoma suspect of both eyes 02/26/2017  . Glaucoma suspect of both eyes 02/26/2017  . Hiatal hernia 01/08/11  . History of IBS 10/27/2007   Qualifier: Diagnosis of  By: Nils Pyle CMA ([place]), Mearl Latin    . HTN (hypertension)   . Hx of adenomatous colonic polyps 01/08/11  . Hyperplastic colon polyp 06/08/2000, 07/31/2003, 01/08/11  . Hypertension, essential 10/27/2007   Qualifier: Diagnosis of  By: Nils Pyle CMA (AAMA), Mearl Latin    . 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 Iron deficiency anemia 01/08/2011  . Left foot pain 09/25/2016  . Localized edema   . Long term (current) use of insulin ([place])   . Mixed hyperlipidemia   . Nuclear cataract 07/03/2011  . Obesity   . Obesity (BMI 30-39.9) 03/22/2012  . Osteoarthritis of right thumb 09/17/2015  . Osteopenia   . Other iron deficiency anemias   . Other specified menopausal and perimenopausal disorders   . PAF (paroxysmal atrial fibrillation) ([facility])    occurring in the setting of bradycardia and mild first degree AV block followed by Dr. Jens Som  . Pain in thoracic spine   . Paraesophageal hiatal hernia, 3cm by EGD 03/22/2012  . Pedal edema 12/13/2019  . Persistent atrial fibrillation ([place]) 08/28/2019  . Posterior vitreous detachment, bilateral 01/10/2016  . Primary biliary cholangitis ([place]) 10/27/2007   Qualifier: Diagnosis of  By: Nils Pyle CMA (AAMA), Mearl Latin    . Pure hypercholesterolemia   . Retinal tear, right 07/03/2011  . Shortness of breath 12/13/2019  . Stage 3b chronic kidney disease ([place]) 09/11/2019  . Type II or unspecified type diabetes mellitus without mention of complication, not stated as uncontrolled   . Uterine fibroid 01/09/12    Past Surgical History:  Procedure Laterality Date  . BREAST CYST EXCISION     left  . CARDIOVERSION N/A 11/27/2017   Procedure: CARDIOVERSION;  Surgeon: Sanda Klein, MD;  Location: [facility];  Service: Cardiovascular;  Laterality: N/A;  .  COLONOSCOPY    . INSERTION OF MESH  05/18/2012   Procedure: INSERTION OF MESH;  Surgeon: Adin Hector, MD;  Location: [facility];  Service: General;  Laterality: N/A;  . LIVER BIOPSY  05/18/2012   Procedure: LIVER BIOPSY;  Surgeon: Adin Hector, MD;  Location: [facility];  Service: General;;  . POLYPECTOMY    . THYROIDECTOMY, PARTIAL  1980  . TUBAL LIGATION  1972  . VENTRAL HERNIA REPAIR  05/18/2012   Procedure: LAPAROSCOPIC VENTRAL HERNIA;  Surgeon: Adin Hector, MD;  Location: [facility];  Service: General;   Laterality: N/A;  Laparoscopic Ventral Wall Hernia with Mesh    Family History  Problem Relation Age of Onset  . Stroke Mother 43  . Heart attack Father   . Diabetes Sister        x 2  . Aneurysm Sister   . Stroke Sister   . Congestive Heart Failure Sister   . Diabetes Brother   . Kidney failure Other   . Hyperlipidemia Other   . Arrhythmia Other   . Congestive Heart Failure Other   . Hypertension Other   . Arthritis Other   . Colon cancer Neg Hx   . Esophageal cancer Neg Hx   . Stomach cancer Neg Hx   . Rectal cancer Neg Hx     Social History   Socioeconomic History  . Marital status: Married    Spouse name: Not on file  . Number of children: 2  . Years of education: Not on file  . Highest education level: Not on file  Occupational History  . Occupation: retired    Fish farm manager: RETIRED  Tobacco Use  . Smoking status: Never  . Smokeless tobacco: Never  Vaping Use  . Vaping Use: Never used  Substance and Sexual Activity  . Alcohol use: No  . Drug use: No  . Sexual activity: Not Currently  Other Topics Concern  . Not on file  Social History Narrative  . Not on file   Social Determinants of Health   Financial Resource Strain: Low Risk  (08/12/2021)   Overall Financial Resource Strain (CARDIA)   . Difficulty of Paying Living Expenses: Not hard at all  Food Insecurity: No Food Insecurity (01/08/2022)   Hunger Vital Sign   . Worried About Charity fundraiser in the Last Year: Never true   . Ran Out of Food in the Last Year: Never true  Transportation Needs: No Transportation Needs (01/08/2022)   PRAPARE - Transportation   . Lack of Transportation (Medical): No   . Lack of Transportation (Non-Medical): No  Physical Activity: Not on file  Stress: Not on file  Social Connections: Not on file  Intimate Partner Violence: Not At Risk (01/08/2022)   Humiliation, Afraid, Rape, and Kick questionnaire   . Fear of Current or Ex-Partner: No   . Emotionally Abused: No   .  Physically Abused: No   . Sexually Abused: No    Outpatient Medications Prior to Visit  Medication Sig Dispense Refill  . Accu-Chek FastClix Lancets MISC CHECKING SUGARS TWICE DAILY. 102 each 3  . amLODipine (NORVASC) 2.5 MG tablet Take 1 tablet (2.5 mg total) by mouth daily. 90 tablet 0  . atorvastatin (LIPITOR) 40 MG tablet TAKE 1 TABLET(40 MG) BY MOUTH DAILY 90 tablet 0  . calcium-vitamin D (OSCAL WITH D) 250-125 MG-UNIT per tablet Take 1 tablet by mouth 2 (two) times daily.    . Cholecalciferol (VITAMIN D3) 1000 units CAPS  Take 1,000 Units by mouth daily.    Marland Kitchen ELIQUIS 2.5 MG TABS tablet TAKE 1 TABLET(2.5 MG) BY MOUTH TWICE DAILY 180 tablet 1  . fexofenadine (ALLEGRA ALLERGY) 180 MG tablet 1 tablet as needed Orally Once a day    . fexofenadine (ALLEGRA ALLERGY) 60 MG tablet Take 1 tablet (60 mg total) by mouth 2 (two) times daily. (Patient taking differently: Take 60 mg by mouth daily.) 180 tablet 1  . fluticasone (FLONASE) 50 MCG/ACT nasal spray Place 1 spray into both nostrils as needed.    . folic acid (FOLVITE) 007 MCG tablet Take 400 mcg by mouth daily.    Marland Kitchen glucose blood (ACCU-CHEK GUIDE) test strip Use as instructed twice daily. E11.9 200 each 3  . insulin detemir (LEVEMIR FLEXTOUCH) 100 UNIT/ML FlexPen INJECT 47 UNITS UNDER THE SKIN EVERY MORNING AND 25 UNITS 69 mL 1  . Insulin Pen Needle (BD PEN NEEDLE NANO 2ND GEN) 32G X 4 MM MISC USE A NEEDLE EACH TIME WHEN CHECKING FBS 200 each 3  . metolazone (ZAROXOLYN) 5 MG tablet Take 1 tablet (5 mg total) by mouth once a week. 4 tablet 6  . metoprolol tartrate (LOPRESSOR) 100 MG tablet TAKE 1 TABLET(100 MG) BY MOUTH TWICE DAILY 180 tablet 3  . Multiple Vitamin (MULTIVITAMIN WITH MINERALS) TABS Take 1 tablet by mouth daily.    Marland Kitchen nystatin cream (MYCOSTATIN) APPLY SUFFICIENT AMOUNT EXTERNALLY TO THE AFFECTED AREA TWICE DAILY 30 g 1  . pantoprazole (PROTONIX) 40 MG tablet TAKE 1 TABLET BY MOUTH EVERY DAY 90 tablet 1  . potassium chloride SA  (KLOR-CON M) 20 MEQ tablet Take 1 tablet (20 mEq total) by mouth 2 (two) times daily. 60 tablet 0  . spironolactone (ALDACTONE) 25 MG tablet Take 25 mg by mouth daily.    Marland Kitchen torsemide (DEMADEX) 20 MG tablet TAKE 2 TABLETS BY MOUTH TWICE DAILY (Patient taking differently: 20 mg daily.) 360 tablet 0  . ursodiol (ACTIGALL) 300 MG capsule TAKE 1 CAPSULE(300 MG) BY MOUTH TWICE DAILY 180 capsule 0  . vitamin C (ASCORBIC ACID) 500 MG tablet Take 500 mg by mouth daily.    . vitamin E 400 UNIT capsule Take 400 Units by mouth daily.     No facility-administered medications prior to visit.    Allergies  Allergen Reactions  . Ace Inhibitors Anaphylaxis  . Capoten [Captopril] Anaphylaxis    Throat swells shut  . Neomycin-Bacitracin Zn-Polymyx Rash    Review of Systems  Constitutional:  Negative for chills, fatigue and fever.  HENT:  Negative for congestion, rhinorrhea and sore throat.   Respiratory:  Positive for cough, shortness of breath and wheezing.   Cardiovascular:  Negative for chest pain.  Gastrointestinal:  Negative for abdominal pain, constipation, diarrhea, nausea and vomiting.  Genitourinary:  Negative for dysuria.  Musculoskeletal:  Negative for back pain and myalgias.  Neurological:  Negative for headaches.  Psychiatric/Behavioral:  Negative for dysphoric mood. The patient is not nervous/anxious.        Objective:    Physical Exam  BP 110/74   Pulse 72   Temp (!) 96.4 F (35.8 C)   Resp 18   Ht 5' 2.5" (1.588 m)   Wt 153 lb 3.2 oz (69.5 kg)   SpO2 99%   BMI 27.57 kg/m  Wt Readings from Last 3 Encounters:  04/11/22 153 lb 3.2 oz (69.5 kg)  02/24/22 149 lb 3.2 oz (67.7 kg)  02/13/22 148 lb (67.1 kg)    Health Maintenance Due  Topic Date Due  . COVID-19 Vaccine (10 - 2023-24 season) 01/10/2022    There are no preventive care reminders to display for this patient.   Lab Results  Component Value Date   TSH 2.020 09/10/2020   Lab Results  Component Value Date    WBC 10.4 02/24/2022   HGB 10.8 (L) 02/24/2022   HCT 32.3 (L) 02/24/2022   MCV 91.9 02/24/2022   PLT 243.0 02/24/2022   Lab Results  Component Value Date   NA 142 03/05/2022   K 4.2 03/05/2022   CO2 29 03/05/2022   GLUCOSE 66 (L) 03/05/2022   BUN 84 (HH) 03/05/2022   CREATININE 2.32 (H) 03/05/2022   BILITOT 0.6 03/05/2022   ALKPHOS 235 (H) 03/05/2022   AST 28 03/05/2022   ALT 21 03/05/2022   PROT 7.3 03/05/2022   ALBUMIN 3.8 03/05/2022   CALCIUM 10.1 03/05/2022   EGFR 20 (L) 03/05/2022   GFR 19.24 (L) 02/24/2022   Lab Results  Component Value Date   CHOL 138 02/10/2022   Lab Results  Component Value Date   HDL 54 02/10/2022   Lab Results  Component Value Date   LDLCALC 67 02/10/2022   Lab Results  Component Value Date   TRIG 92 02/10/2022   Lab Results  Component Value Date   CHOLHDL 2.6 02/10/2022   Lab Results  Component Value Date   HGBA1C 8.2 (H) 02/10/2022       Assessment & Plan:   Problem List Items Addressed This Visit   None  No orders of the defined types were placed in this encounter.   No orders of the defined types were placed in this encounter.    Follow-up: No follow-ups on file.  An After Visit Summary was printed and given to the patient.  Rochel Brome, MD [facility] [phone]

## 2022-04-13 ENCOUNTER — Encounter: Payer: Self-pay | Admitting: Family Medicine

## 2022-04-13 DIAGNOSIS — J0100 Acute maxillary sinusitis, unspecified: Secondary | ICD-10-CM | POA: Insufficient documentation

## 2022-04-13 DIAGNOSIS — R051 Acute cough: Secondary | ICD-10-CM | POA: Insufficient documentation

## 2022-04-13 NOTE — Assessment & Plan Note (Signed)
Covid-19 negative.

## 2022-04-13 NOTE — Assessment & Plan Note (Signed)
Prescription: augmentin.

## 2022-04-16 ENCOUNTER — Other Ambulatory Visit: Payer: Self-pay | Admitting: Gastroenterology

## 2022-04-17 ENCOUNTER — Other Ambulatory Visit: Payer: Self-pay | Admitting: Gastroenterology

## 2022-04-17 ENCOUNTER — Other Ambulatory Visit: Payer: Self-pay | Admitting: Cardiology

## 2022-04-18 ENCOUNTER — Other Ambulatory Visit: Payer: Self-pay

## 2022-04-18 MED ORDER — METOPROLOL TARTRATE 100 MG PO TABS: ORAL_TABLET | ORAL | 3 refills | Status: DC

## 2022-04-18 MED ORDER — POTASSIUM CHLORIDE CRYS ER 20 MEQ PO TBCR: 20.0000 meq | EXTENDED_RELEASE_TABLET | Freq: Two times a day (BID) | ORAL | 1 refills | Status: DC

## 2022-04-21 ENCOUNTER — Ambulatory Visit (INDEPENDENT_AMBULATORY_CARE_PROVIDER_SITE_OTHER): Payer: Medicare Other

## 2022-04-21 ENCOUNTER — Other Ambulatory Visit: Payer: Medicare Other

## 2022-04-21 ENCOUNTER — Other Ambulatory Visit: Payer: Self-pay

## 2022-04-21 DIAGNOSIS — M810 Age-related osteoporosis without current pathological fracture: Secondary | ICD-10-CM

## 2022-04-21 MED ORDER — POTASSIUM CHLORIDE CRYS ER 20 MEQ PO TBCR: 20.0000 meq | EXTENDED_RELEASE_TABLET | Freq: Two times a day (BID) | ORAL | 1 refills | Status: DC

## 2022-04-21 MED ORDER — DENOSUMAB 60 MG/ML ~~LOC~~ SOSY
60.0000 mg | PREFILLED_SYRINGE | Freq: Once | SUBCUTANEOUS | Status: AC
  Administered 2022-04-21: 60 mg via SUBCUTANEOUS

## 2022-04-21 NOTE — Progress Notes (Signed)
   Prolia injection given per order, patient tolerated well.   Erie Noe, LPN 94:17 AM

## 2022-04-22 ENCOUNTER — Telehealth: Payer: Self-pay

## 2022-04-22 NOTE — Telephone Encounter (Signed)
Sara Hancock called requesting that Dr. Tobie Poet get a chest xray on her.  She has seen improvement but she wants to be sure that everything is clear.

## 2022-04-23 DIAGNOSIS — R051 Acute cough: Secondary | ICD-10-CM | POA: Diagnosis not present

## 2022-04-23 DIAGNOSIS — J189 Pneumonia, unspecified organism: Secondary | ICD-10-CM | POA: Diagnosis not present

## 2022-04-25 ENCOUNTER — Other Ambulatory Visit: Payer: Self-pay

## 2022-04-25 MED ORDER — ATORVASTATIN CALCIUM 40 MG PO TABS: 40.0000 mg | ORAL_TABLET | Freq: Every day | ORAL | 1 refills | Status: DC

## 2022-04-28 ENCOUNTER — Encounter: Payer: Self-pay | Admitting: Family Medicine

## 2022-05-02 ENCOUNTER — Ambulatory Visit (INDEPENDENT_AMBULATORY_CARE_PROVIDER_SITE_OTHER): Payer: Medicare Other | Admitting: Family Medicine

## 2022-05-02 ENCOUNTER — Encounter: Payer: Self-pay | Admitting: Family Medicine

## 2022-05-02 VITALS — BP 120/80 | HR 87 | Temp 97.9°F | Resp 15 | Ht 62.5 in | Wt 153.0 lb

## 2022-05-02 DIAGNOSIS — M79605 Pain in left leg: Secondary | ICD-10-CM | POA: Diagnosis not present

## 2022-05-02 DIAGNOSIS — D6869 Other thrombophilia: Secondary | ICD-10-CM | POA: Diagnosis not present

## 2022-05-02 DIAGNOSIS — L03116 Cellulitis of left lower limb: Secondary | ICD-10-CM | POA: Diagnosis not present

## 2022-05-02 LAB — CBC WITH DIFFERENTIAL/PLATELET
Basophils Absolute: 0 10*3/uL (ref 0.0–0.2)
Basos: 0 %
EOS (ABSOLUTE): 0 10*3/uL (ref 0.0–0.4)
Eos: 0 %
Hematocrit: 32.4 % — ABNORMAL LOW (ref 34.0–46.6)
Hemoglobin: 10.5 g/dL — ABNORMAL LOW (ref 11.1–15.9)
Lymphocytes Absolute: 0.9 10*3/uL (ref 0.7–3.1)
Lymphs: 11 %
MCH: 30.5 pg (ref 26.6–33.0)
MCHC: 32.4 g/dL (ref 31.5–35.7)
MCV: 94 fL (ref 79–97)
Monocytes Absolute: 0.8 10*3/uL (ref 0.1–0.9)
Monocytes: 10 %
Neutrophils Absolute: 6.5 10*3/uL (ref 1.4–7.0)
Neutrophils: 79 %
Platelets: 209 10*3/uL (ref 150–450)
RBC: 3.44 x10E6/uL — ABNORMAL LOW (ref 3.77–5.28)
RDW: 16.8 % — ABNORMAL HIGH (ref 11.7–15.4)
WBC: 8.3 10*3/uL (ref 3.4–10.8)

## 2022-05-02 LAB — COMPREHENSIVE METABOLIC PANEL
ALT: 15 IU/L (ref 0–32)
AST: 24 IU/L (ref 0–40)
Albumin/Globulin Ratio: 1.1 — ABNORMAL LOW (ref 1.2–2.2)
Albumin: 3.7 g/dL (ref 3.7–4.7)
Alkaline Phosphatase: 186 IU/L — ABNORMAL HIGH (ref 44–121)
BUN/Creatinine Ratio: 33 — ABNORMAL HIGH (ref 12–28)
BUN: 67 mg/dL — ABNORMAL HIGH (ref 8–27)
Bilirubin Total: 0.5 mg/dL (ref 0.0–1.2)
CO2: 22 mmol/L (ref 20–29)
Calcium: 8 mg/dL — ABNORMAL LOW (ref 8.7–10.3)
Chloride: 104 mmol/L (ref 96–106)
Creatinine, Ser: 2.06 mg/dL — ABNORMAL HIGH (ref 0.57–1.00)
Globulin, Total: 3.3 g/dL (ref 1.5–4.5)
Glucose: 155 mg/dL — ABNORMAL HIGH (ref 70–99)
Potassium: 4.6 mmol/L (ref 3.5–5.2)
Sodium: 139 mmol/L (ref 134–144)
Total Protein: 7 g/dL (ref 6.0–8.5)
eGFR: 23 mL/min/{1.73_m2} — ABNORMAL LOW (ref >59)

## 2022-05-02 LAB — D-DIMER, QUANTITATIVE: D-DIMER: 0.98 mg/L FEU — ABNORMAL HIGH (ref 0.00–0.49)

## 2022-05-02 MED ORDER — CEPHALEXIN 500 MG PO CAPS: 500.0000 mg | ORAL_CAPSULE | Freq: Two times a day (BID) | ORAL | 0 refills | Status: DC

## 2022-05-02 NOTE — Patient Instructions (Addendum)
Increase eliquis 2.5 mg 2 pills oral twice daily. Start on keflex 500 mg twice daily x 7 days.  Checking labs.  Ultrasound of left leg in the morning.   Call the after hours line at [phone] after 1 pm Saturday if you have not received a call from [place] about your ultrasound.

## 2022-05-02 NOTE — Progress Notes (Signed)
Acute Office Visit  Subjective:    Patient ID: Sara Hancock, female    DOB: 08-08-1934, 87 y.o.   MRN: [id number]  Chief Complaint  Patient presents with   Leg Pain    Left lower leg tenderness to touch.    HPI: Patient is in today for soreness on left lower leg to the touch or when she walks since 3 days ago. It was swelling in the beginning, but it went down. She does not recall any trauma.   Past Medical History:  Diagnosis Date   Acquired thrombophilia ([place]) 09/11/2019   Allergic rhinitis 09/17/2015   Anemia    ANEMIA, NORMOCYTIC 10/27/2007   Qualifier: Diagnosis of  By: Nils Pyle CMA (AAMA), Leisha     Anticoagulant long-term use 01/08/2011   Arthritis    Benign neoplasm of colon 01/08/2011   Biliary cirrhosis ([place])    BRADYCARDIA 11/15/2008   Qualifier: Diagnosis of  By: Hollie Salk CMA, Amanda     Cellulitis of left foot 09/25/2016   Cholelithiasis 01/09/12   Chronic kidney disease, stage 3 (HCC)    Combined form of age-related cataract, both eyes 02/26/2017   Diabetes mellitus type 2 without retinopathy ([place]) 10/27/2013   Diverticulosis of colon with hemorrhage 06/08/2000   Dyslipidemia 09/17/2015   Encounter for therapeutic drug monitoring 06/09/2013   Esophageal reflux 01/08/2011   Esophageal stenosis 01/08/11   Esophagitis 01/08/11   GALLSTONES 10/26/2008   Qualifier: Diagnosis of  By: Sharlett Iles MD Byrd Hesselbach    Gastritis, erosive chronic 01/08/2011   GERD (gastroesophageal reflux disease)    Glaucoma suspect of both eyes 02/26/2017   Glaucoma suspect of both eyes 02/26/2017   Hiatal hernia 01/08/11   History of IBS 10/27/2007   Qualifier: Diagnosis of  By: Nils Pyle CMA (AAMA), Mearl Latin     HTN (hypertension)    Hx of adenomatous colonic polyps 01/08/11   Hyperplastic colon polyp 06/08/2000, 07/31/2003, 01/08/11   Hypertension, essential 10/27/2007   Qualifier: Diagnosis of  By: Nils Pyle CMA (AAMA), Mearl Latin     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, with  stage 1 through stage 4 chronic kidney disease, or unspecified chronic kidney disease    Iron deficiency anemia 01/08/2011   Left foot pain 09/25/2016   Localized edema    Long term (current) use of insulin (HCC)    Mixed hyperlipidemia    Nuclear cataract 07/03/2011   Obesity    Obesity (BMI 30-39.9) 03/22/2012   Osteoarthritis of right thumb 09/17/2015   Osteopenia    Other iron deficiency anemias    Other specified menopausal and perimenopausal disorders    PAF (paroxysmal atrial fibrillation) (HCC)    occurring in the setting of bradycardia and mild first degree AV block followed by Dr. 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   Qualifier: Diagnosis of  By: Nils Pyle CMA (AAMA), Mearl Latin     Pure hypercholesterolemia    Retinal tear, right 07/03/2011   Shortness of breath 12/13/2019   Stage 3b chronic kidney disease ([place]) 09/11/2019   Type II or unspecified type diabetes mellitus without mention of complication, not stated as uncontrolled    Uterine fibroid 01/09/12    Past Surgical History:  Procedure Laterality Date   BREAST CYST EXCISION  left   CARDIOVERSION N/A 11/27/2017   Procedure: CARDIOVERSION;  Surgeon: Sanda Klein, MD;  Location: [facility];  Service: Cardiovascular;  Laterality: N/A;   COLONOSCOPY     INSERTION OF MESH  05/18/2012   Procedure: INSERTION OF MESH;  Surgeon: Adin Hector, MD;  Location: [facility];  Service: General;  Laterality: N/A;   LIVER BIOPSY  05/18/2012   Procedure: LIVER BIOPSY;  Surgeon: Adin Hector, MD;  Location: [facility];  Service: General;;   POLYPECTOMY     THYROIDECTOMY, [place]  05/18/2012   Procedure: LAPAROSCOPIC VENTRAL HERNIA;  Surgeon: Adin Hector, MD;  Location:  [facility];  Service: General;  Laterality: N/A;  Laparoscopic Ventral Wall Hernia with Mesh    Family History  Problem Relation Age of Onset   Stroke Mother 32   Heart attack Father    Diabetes Sister        x 2   Aneurysm Sister    Stroke Sister    Congestive Heart Failure Sister    Diabetes Brother    Kidney failure Other    Hyperlipidemia Other    Arrhythmia Other    Congestive Heart Failure Other    Hypertension Other    Arthritis Other    Colon cancer Neg Hx    Esophageal cancer Neg Hx    Stomach cancer Neg Hx    Rectal cancer Neg Hx     Social History   Socioeconomic History   Marital status: Married    Spouse name: Not on file   Number of children: 2   Years of education: Not on file   Highest education level: Not on file  Occupational History   Occupation: retired    Fish farm manager: RETIRED  Tobacco Use   Smoking status: Never   Smokeless tobacco: Never  Vaping Use   Vaping Use: Never used  Substance and Sexual Activity   Alcohol use: No   Drug use: No   Sexual activity: Not Currently  Other Topics Concern   Not on file  Social History Narrative   Not on file   Social Determinants of Health   Financial Resource Strain: Low Risk  (08/12/2021)   Overall Financial Resource Strain (CARDIA)    Difficulty of Paying Living Expenses: Not hard at all  Food Insecurity: No Food Insecurity (01/08/2022)   Hunger Vital Sign    Worried About Running Out of Food in the Last Year: Never true    [place] in the Last Year: Never true  Transportation Needs: No Transportation Needs (01/08/2022)   PRAPARE - Hydrologist (Medical): No    Lack of Transportation (Non-Medical): No  Physical Activity: Not on file  Stress: Not on file  Social Connections: Not on file  Intimate Partner Violence: Not At Risk (01/08/2022)   Humiliation, Afraid, Rape, and Kick questionnaire    Fear of Current or Ex-Partner: No    Emotionally Abused: No    Physically  Abused: No    Sexually Abused: No    Outpatient Medications Prior to Visit  Medication Sig Dispense Refill   Accu-Chek FastClix Lancets MISC CHECKING SUGARS TWICE DAILY. 102 each 3   amLODipine (NORVASC) 2.5 MG tablet Take 1 tablet (2.5 mg total) by mouth daily. 90 tablet 0   atorvastatin (LIPITOR) 40 MG tablet Take 1 tablet (40 mg total) by mouth daily. 90 tablet 1  calcium-vitamin D (OSCAL WITH D) 250-125 MG-UNIT per tablet Take 1 tablet by mouth 2 (two) times daily.     Cholecalciferol (VITAMIN D3) 1000 units CAPS Take 1,000 Units by mouth daily.     ELIQUIS 2.5 MG TABS tablet TAKE 1 TABLET(2.5 MG) BY MOUTH TWICE DAILY 180 tablet 1   fexofenadine (ALLEGRA ALLERGY) 180 MG tablet 1 tablet as needed Orally Once a day     fexofenadine (ALLEGRA ALLERGY) 60 MG tablet Take 1 tablet (60 mg total) by mouth 2 (two) times daily. (Patient taking differently: Take 60 mg by mouth daily.) 180 tablet 1   fluticasone (FLONASE) 50 MCG/ACT nasal spray Place 1 spray into both nostrils as needed.     folic acid (FOLVITE) 423 MCG tablet Take 400 mcg by mouth daily.     glucose blood (ACCU-CHEK GUIDE) test strip Use as instructed twice daily. E11.9 200 each 3   Insulin Pen Needle (BD PEN NEEDLE NANO 2ND GEN) 32G X 4 MM MISC USE A NEEDLE EACH TIME WHEN CHECKING FBS 200 each 3   metolazone (ZAROXOLYN) 5 MG tablet TAKE 1 TABLET(5 MG) BY MOUTH 1 TIME A WEEK 4 tablet 8   metoprolol tartrate (LOPRESSOR) 100 MG tablet Take 1 twice daily 180 tablet 3   Multiple Vitamin (MULTIVITAMIN WITH MINERALS) TABS Take 1 tablet by mouth daily.     pantoprazole (PROTONIX) 40 MG tablet TAKE 1 TABLET BY MOUTH EVERY DAY 90 tablet 1   spironolactone (ALDACTONE) 25 MG tablet Take 25 mg by mouth daily.     torsemide (DEMADEX) 20 MG tablet TAKE 2 TABLETS BY MOUTH TWICE DAILY (Patient taking differently: 20 mg daily.) 360 tablet 0   ursodiol (ACTIGALL) 300 MG capsule TAKE 1 CAPSULE(300 MG) BY MOUTH TWICE DAILY 180 capsule 0   vitamin C  (ASCORBIC ACID) 500 MG tablet Take 500 mg by mouth daily.     vitamin E 400 UNIT capsule Take 400 Units by mouth daily.     insulin detemir (LEVEMIR FLEXTOUCH) 100 UNIT/ML FlexPen INJECT 47 UNITS UNDER THE SKIN EVERY MORNING AND 25 UNITS (Patient taking differently: INJECT 35 UNITS UNDER THE SKIN EVERY MORNING AND 15 UNITS) 69 mL 1   nystatin cream (MYCOSTATIN) APPLY SUFFICIENT AMOUNT EXTERNALLY TO THE AFFECTED AREA TWICE DAILY 30 g 1   potassium chloride SA (KLOR-CON M) 20 MEQ tablet Take 1 tablet (20 mEq total) by mouth 2 (two) times daily. 60 tablet 1   amoxicillin-clavulanate (AUGMENTIN) 875-125 MG tablet Take 1 tablet by mouth 2 (two) times daily. 20 tablet 0   No facility-administered medications prior to visit.    Allergies  Allergen Reactions   Ace Inhibitors Anaphylaxis   Capoten [Captopril] Anaphylaxis    Throat swells shut   Neomycin-Bacitracin Zn-Polymyx Rash    Review of Systems  Constitutional:  Negative for chills, fatigue and fever.  HENT:  Negative for congestion, ear pain and sore throat.   Respiratory:  Positive for shortness of breath. Negative for cough.   Cardiovascular:  Negative for chest pain and palpitations.  Gastrointestinal:  Negative for abdominal pain, constipation, diarrhea, nausea and vomiting.  Endocrine: Negative for polydipsia, polyphagia and polyuria.  Genitourinary:  Negative for difficulty urinating and dysuria.  Musculoskeletal:  Negative for arthralgias, back pain and myalgias.  Skin:  Negative for rash.  Neurological:  Negative for headaches.  Psychiatric/Behavioral:  Negative for dysphoric mood. The patient is not nervous/anxious.        Objective:    Physical Exam Vitals reviewed.  Constitutional:      Appearance: Normal appearance.  Cardiovascular:     Rate and Rhythm: Normal rate. Rhythm irregular.     Heart sounds: Normal heart sounds.  Pulmonary:     Effort: Pulmonary effort is normal. No respiratory distress.     Breath  sounds: Normal breath sounds.  Abdominal:     General: Abdomen is flat. Bowel sounds are normal.     Palpations: Abdomen is soft.     Tenderness: There is no abdominal tenderness.  Musculoskeletal:        General: Tenderness (left lower leg. edema present. erythema) present.  Neurological:     Mental Status: She is alert and oriented to person, place, and time.  Psychiatric:        Mood and Affect: Mood normal.        Behavior: Behavior normal.     BP 120/80   Pulse 87   Temp 97.9 F (36.6 C)   Resp 15   Ht 5' 2.5" (1.588 m)   Wt 153 lb (69.4 kg)   SpO2 95%   BMI 27.54 kg/m  Wt Readings from Last 3 Encounters:  05/02/22 153 lb (69.4 kg)  04/11/22 153 lb 3.2 oz (69.5 kg)  02/24/22 149 lb 3.2 oz (67.7 kg)    Health Maintenance Due  Topic Date Due   COVID-19 Vaccine (11 - 2023-24 season) 04/23/2022    There are no preventive care reminders to display for this patient.   Lab Results  Component Value Date   TSH 2.020 09/10/2020   Lab Results  Component Value Date   WBC 8.3 05/02/2022   HGB 10.5 (L) 05/02/2022   HCT 32.4 (L) 05/02/2022   MCV 94 05/02/2022   PLT 209 05/02/2022   Lab Results  Component Value Date   NA 139 05/02/2022   K 4.6 05/02/2022   CO2 22 05/02/2022   GLUCOSE 155 (H) 05/02/2022   BUN 67 (H) 05/02/2022   CREATININE 2.06 (H) 05/02/2022   BILITOT 0.5 05/02/2022   ALKPHOS 186 (H) 05/02/2022   AST 24 05/02/2022   ALT 15 05/02/2022   PROT 7.0 05/02/2022   ALBUMIN 3.7 05/02/2022   CALCIUM 8.0 (L) 05/02/2022   EGFR 23 (L) 05/02/2022   GFR 19.24 (L) 02/24/2022   Lab Results  Component Value Date   CHOL 138 02/10/2022   Lab Results  Component Value Date   HDL 54 02/10/2022   Lab Results  Component Value Date   LDLCALC 67 02/10/2022   Lab Results  Component Value Date   TRIG 92 02/10/2022   Lab Results  Component Value Date   CHOLHDL 2.6 02/10/2022   Lab Results  Component Value Date   HGBA1C 8.2 (H) 02/10/2022        Assessment & Plan:   Problem List Items Addressed This Visit       Hematopoietic and Hemostatic   Acquired thrombophilia ([place])    Currently on eliquis due to atrial fibrillation. Low dose due to age and CKD.        Other   Left leg cellulitis    Start on antibiotic. Check cbc, cmp.      Relevant Medications   cephALEXin (KEFLEX) 500 MG capsule   Other Relevant Orders   CBC with Differential/Platelet (Completed)   Comprehensive metabolic panel (Completed)   Left leg pain - Primary    [place] to rule out DVT.  Check ddimer.  If [place] positive, plan to increase eliquis to 5  mg twice daily. Unlikely to be positive.        Relevant Orders   US Venous Img Lower Unilateral Left   D-dimer, quantitative (Completed)   Meds ordered this encounter  Medications   cephALEXin (KEFLEX) 500 MG capsule    Sig: Take 1 capsule (500 mg total) by mouth 2 (two) times daily.    Dispense:  14 capsule    Refill:  0    Orders Placed This Encounter  Procedures   US Venous Img Lower Unilateral Left   CBC with Differential/Platelet   Comprehensive metabolic panel   D-dimer, quantitative     Follow-up: Return if symptoms worsen or fail to improve.  An After Visit Summary was printed and given to the patient.  Rochel Brome, MD [facility] [phone]

## 2022-05-03 DIAGNOSIS — M7989 Other specified soft tissue disorders: Secondary | ICD-10-CM | POA: Diagnosis not present

## 2022-05-03 DIAGNOSIS — M79605 Pain in left leg: Secondary | ICD-10-CM | POA: Diagnosis not present

## 2022-05-07 ENCOUNTER — Other Ambulatory Visit: Payer: Self-pay | Admitting: Family Medicine

## 2022-05-09 ENCOUNTER — Telehealth: Payer: Self-pay

## 2022-05-09 NOTE — Telephone Encounter (Signed)
Patient called and stated that her sugars have increased and been running high so she has changed her levemir insulin to 48 units in the AM and 26 units in the PM. Changing in patient's chart and documented.

## 2022-05-13 NOTE — Assessment & Plan Note (Signed)
Currently on eliquis due to atrial fibrillation. Low dose due to age and CKD.

## 2022-05-13 NOTE — Assessment & Plan Note (Addendum)
Start on antibiotic. Check cbc, cmp.

## 2022-05-13 NOTE — Assessment & Plan Note (Addendum)
[place] to rule out DVT.  Check ddimer.  If [place] positive, plan to increase eliquis to 5 mg twice daily. Unlikely to be positive.

## 2022-05-19 ENCOUNTER — Other Ambulatory Visit: Payer: Medicare Other

## 2022-05-19 DIAGNOSIS — E538 Deficiency of other specified B group vitamins: Secondary | ICD-10-CM | POA: Diagnosis not present

## 2022-05-19 DIAGNOSIS — E119 Type 2 diabetes mellitus without complications: Secondary | ICD-10-CM | POA: Diagnosis not present

## 2022-05-19 DIAGNOSIS — I152 Hypertension secondary to endocrine disorders: Secondary | ICD-10-CM | POA: Diagnosis not present

## 2022-05-19 DIAGNOSIS — M79672 Pain in left foot: Secondary | ICD-10-CM | POA: Diagnosis not present

## 2022-05-19 DIAGNOSIS — E782 Mixed hyperlipidemia: Secondary | ICD-10-CM

## 2022-05-19 DIAGNOSIS — E1159 Type 2 diabetes mellitus with other circulatory complications: Secondary | ICD-10-CM

## 2022-05-20 LAB — LIPID PANEL
Chol/HDL Ratio: 2.5 ratio (ref 0.0–4.4)
Cholesterol, Total: 129 mg/dL (ref 100–199)
HDL: 51 mg/dL (ref >39)
LDL Chol Calc (NIH): 63 mg/dL (ref 0–99)
Triglycerides: 77 mg/dL (ref 0–149)
VLDL Cholesterol Cal: 15 mg/dL (ref 5–40)

## 2022-05-20 LAB — COMPREHENSIVE METABOLIC PANEL
ALT: 14 IU/L (ref 0–32)
AST: 23 IU/L (ref 0–40)
Albumin/Globulin Ratio: 1.1 — ABNORMAL LOW (ref 1.2–2.2)
Albumin: 3.7 g/dL (ref 3.7–4.7)
Alkaline Phosphatase: 165 IU/L — ABNORMAL HIGH (ref 44–121)
BUN/Creatinine Ratio: 27 (ref 12–28)
BUN: 58 mg/dL — ABNORMAL HIGH (ref 8–27)
Bilirubin Total: 0.4 mg/dL (ref 0.0–1.2)
CO2: 23 mmol/L (ref 20–29)
Calcium: 9.1 mg/dL (ref 8.7–10.3)
Chloride: 99 mmol/L (ref 96–106)
Creatinine, Ser: 2.14 mg/dL — ABNORMAL HIGH (ref 0.57–1.00)
Globulin, Total: 3.4 g/dL (ref 1.5–4.5)
Glucose: 72 mg/dL (ref 70–99)
Potassium: 4.3 mmol/L (ref 3.5–5.2)
Sodium: 140 mmol/L (ref 134–144)
Total Protein: 7.1 g/dL (ref 6.0–8.5)
eGFR: 22 mL/min/{1.73_m2} — ABNORMAL LOW (ref >59)

## 2022-05-20 LAB — CBC WITH DIFF/PLATELET
Basophils Absolute: 0 10*3/uL (ref 0.0–0.2)
Basos: 0 %
EOS (ABSOLUTE): 0 10*3/uL (ref 0.0–0.4)
Eos: 0 %
Hematocrit: 30.8 % — ABNORMAL LOW (ref 34.0–46.6)
Hemoglobin: 10.2 g/dL — ABNORMAL LOW (ref 11.1–15.9)
Immature Grans (Abs): 0 10*3/uL (ref 0.0–0.1)
Immature Granulocytes: 0 %
Lymphocytes Absolute: 1 10*3/uL (ref 0.7–3.1)
Lymphs: 14 %
MCH: 30.1 pg (ref 26.6–33.0)
MCHC: 33.1 g/dL (ref 31.5–35.7)
MCV: 91 fL (ref 79–97)
Monocytes Absolute: 0.8 10*3/uL (ref 0.1–0.9)
Monocytes: 11 %
Neutrophils Absolute: 5.5 10*3/uL (ref 1.4–7.0)
Neutrophils: 75 %
Platelets: 244 10*3/uL (ref 150–450)
RBC: 3.39 x10E6/uL — ABNORMAL LOW (ref 3.77–5.28)
RDW: 14 % (ref 11.7–15.4)
WBC: 7.4 10*3/uL (ref 3.4–10.8)

## 2022-05-20 LAB — CARDIOVASCULAR RISK ASSESSMENT

## 2022-05-20 LAB — HEMOGLOBIN A1C
Est. average glucose Bld gHb Est-mCnc: 157 mg/dL
Hgb A1c MFr Bld: 7.1 % — ABNORMAL HIGH (ref 4.8–5.6)

## 2022-05-21 ENCOUNTER — Other Ambulatory Visit: Payer: Self-pay

## 2022-05-21 DIAGNOSIS — M79605 Pain in left leg: Secondary | ICD-10-CM

## 2022-05-22 ENCOUNTER — Ambulatory Visit (INDEPENDENT_AMBULATORY_CARE_PROVIDER_SITE_OTHER): Payer: Medicare Other | Admitting: Family Medicine

## 2022-05-22 ENCOUNTER — Encounter: Payer: Self-pay | Admitting: Family Medicine

## 2022-05-22 VITALS — BP 124/72 | HR 80 | Temp 97.3°F | Ht 62.5 in | Wt 155.0 lb

## 2022-05-22 DIAGNOSIS — I131 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, with stage 1 through stage 4 chronic kidney disease, or unspecified chronic kidney disease: Secondary | ICD-10-CM | POA: Diagnosis not present

## 2022-05-22 DIAGNOSIS — I480 Paroxysmal atrial fibrillation: Secondary | ICD-10-CM

## 2022-05-22 DIAGNOSIS — N184 Chronic kidney disease, stage 4 (severe): Secondary | ICD-10-CM

## 2022-05-22 DIAGNOSIS — K219 Gastro-esophageal reflux disease without esophagitis: Secondary | ICD-10-CM | POA: Diagnosis not present

## 2022-05-22 DIAGNOSIS — E1122 Type 2 diabetes mellitus with diabetic chronic kidney disease: Secondary | ICD-10-CM

## 2022-05-22 DIAGNOSIS — E782 Mixed hyperlipidemia: Secondary | ICD-10-CM | POA: Diagnosis not present

## 2022-05-22 DIAGNOSIS — M79672 Pain in left foot: Secondary | ICD-10-CM | POA: Insufficient documentation

## 2022-05-22 DIAGNOSIS — E538 Deficiency of other specified B group vitamins: Secondary | ICD-10-CM | POA: Diagnosis not present

## 2022-05-22 MED ORDER — TRIAMCINOLONE ACETONIDE 40 MG/ML IJ SUSP
40.0000 mg | Freq: Once | INTRAMUSCULAR | Status: AC
  Administered 2022-05-22: 40 mg via INTRAMUSCULAR

## 2022-05-22 NOTE — Assessment & Plan Note (Signed)
Well controlled.  °No changes to medicines.  °Continue to work on eating a healthy diet and exercise.  °Labs reviewed.  °

## 2022-05-22 NOTE — Assessment & Plan Note (Signed)
Stable. The current medical regimen is effective;  continue present plan and medications.  

## 2022-05-22 NOTE — Assessment & Plan Note (Signed)
Concerning for gout.  Kenalog 40 mg IM.

## 2022-05-22 NOTE — Progress Notes (Signed)
Subjective:  Patient ID: Sara Hancock, female    DOB: 08-Apr-1935  Age: 88 y.o. MRN: [id number]  Chief Complaint  Patient presents with   Diabetes   Hyperlipidemia   Hypertension    HPI Diabetes:  Complications: Hyperlipidemia Glucose checking: twice a day Glucose logs: 90-100 morning and 120-130s evening Hypoglycemia: no Most recent A1C: 7.1% Current medications: Levemir 46 units in the morning and 24 units in the evenning. Last Eye Exam: 2/9/ 2023 Foot checks: daily  Hyperlipidemia: Current medications: Atorvastatin 40 mg daily  Hypertension: Complications: Diabetes. Current medications: Metoprolol 100 mg twice a day, Spironolactone 25 mg daily.  Afib: Taking Eliquis 2.5 mg twice a day   Complaining of left foot swollen and red, particularly the 2nd and 3rd digit x 1 month.   Current Outpatient Medications on File Prior to Visit  Medication Sig Dispense Refill   Accu-Chek FastClix Lancets MISC CHECKING SUGARS TWICE DAILY. 102 each 3   amLODipine (NORVASC) 2.5 MG tablet Take 1 tablet (2.5 mg total) by mouth daily. 90 tablet 0   atorvastatin (LIPITOR) 40 MG tablet Take 1 tablet (40 mg total) by mouth daily. 90 tablet 1   calcium-vitamin D (OSCAL WITH D) 250-125 MG-UNIT per tablet Take 1 tablet by mouth 2 (two) times daily.     Cholecalciferol (VITAMIN D3) 1000 units CAPS Take 1,000 Units by mouth daily.     ELIQUIS 2.5 MG TABS tablet TAKE 1 TABLET(2.5 MG) BY MOUTH TWICE DAILY 180 tablet 1   fexofenadine (ALLEGRA ALLERGY) 180 MG tablet 1 tablet as needed Orally Once a day     fexofenadine (ALLEGRA ALLERGY) 60 MG tablet Take 1 tablet (60 mg total) by mouth 2 (two) times daily. (Patient taking differently: Take 60 mg by mouth daily.) 180 tablet 1   fluticasone (FLONASE) 50 MCG/ACT nasal spray Place 1 spray into both nostrils as needed.     folic acid (FOLVITE) 387 MCG tablet Take 400 mcg by mouth daily.     glucose blood (ACCU-CHEK GUIDE) test strip Use as instructed  twice daily. E11.9 200 each 3   insulin detemir (LEVEMIR FLEXPEN) 100 UNIT/ML FlexPen INJECT 48 UNITS UNDER THE SKIN EVERY MORNING AND 26 UNITS WITH SUPPER 54 mL 1   Insulin Pen Needle (BD PEN NEEDLE NANO 2ND GEN) 32G X 4 MM MISC USE A NEEDLE EACH TIME WHEN CHECKING FBS 200 each 3   metolazone (ZAROXOLYN) 5 MG tablet TAKE 1 TABLET(5 MG) BY MOUTH 1 TIME A WEEK 4 tablet 8   metoprolol tartrate (LOPRESSOR) 100 MG tablet Take 1 twice daily 180 tablet 3   Multiple Vitamin (MULTIVITAMIN WITH MINERALS) TABS Take 1 tablet by mouth daily.     nystatin cream (MYCOSTATIN) APPLY SUFFICIENT AMOUNT TOPICALLY TO THE AFFECTED AREA TWICE DAILY 30 g 1   pantoprazole (PROTONIX) 40 MG tablet TAKE 1 TABLET BY MOUTH EVERY DAY 90 tablet 1   potassium chloride SA (KLOR-CON M) 20 MEQ tablet TAKE 1 TABLET(20 MEQ) BY MOUTH TWICE DAILY 180 tablet 1   spironolactone (ALDACTONE) 25 MG tablet Take 25 mg by mouth daily.     torsemide (DEMADEX) 20 MG tablet TAKE 2 TABLETS BY MOUTH TWICE DAILY (Patient taking differently: 20 mg daily.) 360 tablet 0   ursodiol (ACTIGALL) 300 MG capsule TAKE 1 CAPSULE(300 MG) BY MOUTH TWICE DAILY 180 capsule 0   vitamin C (ASCORBIC ACID) 500 MG tablet Take 500 mg by mouth daily.     vitamin E 400 UNIT capsule Take 400  Units by mouth daily.     No current facility-administered medications on file prior to visit.   Past Medical History:  Diagnosis Date   Acquired thrombophilia ([place]) 09/11/2019   Allergic rhinitis 09/17/2015   Anemia    ANEMIA, NORMOCYTIC 10/27/2007   Qualifier: Diagnosis of  By: Nils Pyle CMA (AAMA), Leisha     Anticoagulant long-term use 01/08/2011   Arthritis    Benign neoplasm of colon 01/08/2011   Biliary cirrhosis ([place])    BRADYCARDIA 11/15/2008   Qualifier: Diagnosis of  By: Hollie Salk CMA, Amanda     Cellulitis of left foot 09/25/2016   Cholelithiasis 01/09/12   Chronic kidney disease, stage 3 (HCC)    Combined form of age-related cataract, both eyes 02/26/2017   Diabetes  mellitus type 2 without retinopathy ([place]) 10/27/2013   Diverticulosis of colon with hemorrhage 06/08/2000   Dyslipidemia 09/17/2015   Encounter for therapeutic drug monitoring 06/09/2013   Esophageal reflux 01/08/2011   Esophageal stenosis 01/08/11   Esophagitis 01/08/11   GALLSTONES 10/26/2008   Qualifier: Diagnosis of  By: Sharlett Iles MD Byrd Hesselbach    Gastritis, erosive chronic 01/08/2011   GERD (gastroesophageal reflux disease)    Glaucoma suspect of both eyes 02/26/2017   Glaucoma suspect of both eyes 02/26/2017   Hiatal hernia 01/08/11   History of IBS 10/27/2007   Qualifier: Diagnosis of  By: Nils Pyle CMA (AAMA), Mearl Latin     HTN (hypertension)    Hx of adenomatous colonic polyps 01/08/11   Hyperplastic colon polyp 06/08/2000, 07/31/2003, 01/08/11   Hypertension, essential 10/27/2007   Qualifier: Diagnosis of  By: Nils Pyle CMA (AAMA), Mearl Latin     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, with stage 1 through stage 4 chronic kidney disease, or unspecified chronic kidney disease    Iron deficiency anemia 01/08/2011   Left foot pain 09/25/2016   Localized edema    Long term (current) use of insulin (HCC)    Mixed hyperlipidemia    Nuclear cataract 07/03/2011   Obesity    Obesity (BMI 30-39.9) 03/22/2012   Osteoarthritis of right thumb 09/17/2015   Osteopenia    Other iron deficiency anemias    Other specified menopausal and perimenopausal disorders    PAF (paroxysmal atrial fibrillation) (HCC)    occurring in the setting of bradycardia and mild first degree AV block followed by Dr. Jens Som   Pain in thoracic spine    Paraesophageal hiatal hernia, 3cm by EGD 03/22/2012   Pedal edema 12/13/2019   Persistent atrial fibrillation ([place]) 08/28/2019   Posterior vitreous detachment, bilateral 01/10/2016   Primary biliary cholangitis ([place]) 10/27/2007   Qualifier: Diagnosis of  By: Nils Pyle CMA (AAMA), Mearl Latin     Pure hypercholesterolemia    Retinal tear, right 07/03/2011   Shortness of  breath 12/13/2019   Stage 3b chronic kidney disease ([place]) 09/11/2019   Type II or unspecified type diabetes mellitus without mention of complication, not stated as uncontrolled    Uterine fibroid 01/09/12   Past Surgical History:  Procedure Laterality Date   BREAST CYST EXCISION     left   CARDIOVERSION N/A 11/27/2017   Procedure: CARDIOVERSION;  Surgeon: Sanda Klein, MD;  Location: [facility];  Service: Cardiovascular;  Laterality: N/A;   COLONOSCOPY     INSERTION OF MESH  05/18/2012   Procedure: INSERTION OF MESH;  Surgeon: Adin Hector, MD;  Location: [facility];  Service: General;  Laterality: N/A;   LIVER BIOPSY  05/18/2012   Procedure: LIVER BIOPSY;  Surgeon: Adin Hector, MD;  Location: [facility];  Service: General;;   POLYPECTOMY     THYROIDECTOMY, PARTIAL  1980   [place] HERNIA REPAIR  05/18/2012   Procedure: LAPAROSCOPIC VENTRAL HERNIA;  Surgeon: Adin Hector, MD;  Location: [facility];  Service: General;  Laterality: N/A;  Laparoscopic Ventral Wall Hernia with Mesh    Family History  Problem Relation Age of Onset   Stroke Mother 57   Heart attack Father    Diabetes Sister        x 2   Aneurysm Sister    Stroke Sister    Congestive Heart Failure Sister    Diabetes Brother    Kidney failure Other    Hyperlipidemia Other    Arrhythmia Other    Congestive Heart Failure Other    Hypertension Other    Arthritis Other    Colon cancer Neg Hx    Esophageal cancer Neg Hx    Stomach cancer Neg Hx    Rectal cancer Neg Hx    Social History   Socioeconomic History   Marital status: Married    Spouse name: Not on file   Number of children: 2   Years of education: Not on file   Highest education level: Not on file  Occupational History   Occupation: retired    Fish farm manager: RETIRED  Tobacco Use   Smoking status: Never   Smokeless tobacco: Never  Vaping Use   Vaping Use: Never used  Substance and Sexual Activity   Alcohol use: No   Drug use: No    Sexual activity: Not Currently  Other Topics Concern   Not on file  Social History Narrative   Not on file   Social Determinants of Health   Financial Resource Strain: Low Risk  (08/12/2021)   Overall Financial Resource Strain (CARDIA)    Difficulty of Paying Living Expenses: Not hard at all  Food Insecurity: No Food Insecurity (01/08/2022)   Hunger Vital Sign    Worried About Running Out of Food in the Last Year: Never true    Ran Out of Food in the Last Year: Never true  Transportation Needs: No Transportation Needs (01/08/2022)   PRAPARE - Hydrologist (Medical): No    Lack of Transportation (Non-Medical): No  Physical Activity: Not on file  Stress: Not on file  Social Connections: Not on file    Review of Systems  Constitutional:  Negative for appetite change, fatigue and fever.  HENT:  Negative for congestion, ear pain, sinus pressure and sore throat.   Respiratory:  Negative for cough, chest tightness, shortness of breath and wheezing.   Cardiovascular:  Negative for chest pain and palpitations.  Gastrointestinal:  Negative for abdominal pain, constipation, diarrhea, nausea and vomiting.  Genitourinary:  Negative for dysuria and hematuria.  Musculoskeletal:  Positive for arthralgias and joint swelling. Negative for back pain and myalgias.  Skin:  Negative for rash.  Neurological:  Negative for dizziness, weakness and headaches.  Psychiatric/Behavioral:  Negative for dysphoric mood. The patient is not nervous/anxious.      Objective:  BP 124/72 (BP Location: Left Arm, Patient Position: Sitting)   Pulse 80   Temp (!) 97.3 F (36.3 C) (Temporal)   Ht 5' 2.5" (1.588 m)   Wt 155 lb (70.3 kg)   SpO2 97%   BMI 27.90 kg/m      05/22/2022   10:52 AM 05/02/2022   10:49  AM 04/11/2022   11:34 AM  BP/Weight  Systolic BP 694 854 627  Diastolic BP 72 80 74  Wt. (Lbs) 155 153 153.2  BMI 27.9 kg/m2 27.54 kg/m2 27.57 kg/m2    Physical  Exam Vitals reviewed.  Constitutional:      Appearance: Normal appearance. She is normal weight.  Cardiovascular:     Rate and Rhythm: Normal rate and regular rhythm.     Heart sounds: Normal heart sounds.  Pulmonary:     Effort: Pulmonary effort is normal.     Breath sounds: Normal breath sounds.  Abdominal:     General: Abdomen is flat. Bowel sounds are normal.     Palpations: Abdomen is soft.  Neurological:     Mental Status: She is alert and oriented to person, place, and time.  Psychiatric:        Mood and Affect: Mood normal.        Behavior: Behavior normal.     Diabetic Foot Exam - Simple   Simple Foot Form  05/22/2022  5:58 PM  Visual Inspection See comments: Yes Sensation Testing Intact to touch and monofilament testing bilaterally: Yes Pulse Check Posterior Tibialis and Dorsalis pulse intact bilaterally: Yes Comments Left foot: swelling between 2nd and 3rd metatarsal heads and erythema in the same area.       Lab Results  Component Value Date   WBC 7.4 05/19/2022   HGB 10.2 (L) 05/19/2022   HCT 30.8 (L) 05/19/2022   PLT 244 05/19/2022   GLUCOSE 68 (L) 05/26/2022   CHOL 129 05/19/2022   TRIG 77 05/19/2022   HDL 51 05/19/2022   LDLCALC 63 05/19/2022   ALT 14 05/19/2022   AST 23 05/19/2022   NA 140 05/26/2022   K 4.4 05/26/2022   CL 97 05/26/2022   CREATININE 2.05 (H) 05/26/2022   BUN 66 (H) 05/26/2022   CO2 23 05/26/2022   TSH 2.020 09/10/2020   INR 1.2 (H) 02/24/2022   HGBA1C 7.1 (H) 05/19/2022      Assessment & Plan:   PAF (paroxysmal atrial fibrillation) (HCC) Stable. The current medical regimen is effective;  continue present plan and medications.   Mixed hyperlipidemia Well controlled.  No changes to medicines.  Continue to work on eating a healthy diet and exercise.  Labs reviewed.   Left foot pain Concerning for gout.  Kenalog 40 mg IM.   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  No changes to medicines. Metoprolol 100 mg twice a day, Spironolactone 25 mg daily. Continue to work on eating a healthy diet and exercise.    GERD (gastroesophageal reflux disease) The current medical regimen is effective;  continue present plan and medications.  Taking Pantoprazole 40  mg everyday.   CKD stage 4 due to type 2 diabetes mellitus ([place]) Control: Fair Recommend check sugars fasting daily. Recommend check feet daily. Recommend annual eye exams. Medicines: Levemir 46 units in the morning and 24 units in the evenning. Continue to work on eating a healthy diet and exercise.    B12 deficiency Check labs   Meds ordered this encounter  Medications   triamcinolone acetonide (KENALOG-40) injection 40 mg    Orders Placed This Encounter  Procedures   Vitamin B12   Uric Acid     Follow-up: Return in about 3 months (around 08/21/2022) for chronic fasting.  An After Visit Summary was printed  and given to the patient.   Thompson Caul, CMA,acting as a scribe for Rochel Brome, MD.,have documented all relevant documentation on the behalf of Rochel Brome, MD,as directed by  Rochel Brome, MD while in the presence of Rochel Brome, MD.    Rochel Brome, MD [place] [phone]

## 2022-05-23 LAB — URIC ACID: Uric Acid: 9.4 mg/dL — ABNORMAL HIGH (ref 3.1–7.9)

## 2022-05-23 LAB — SPECIMEN STATUS REPORT

## 2022-05-23 LAB — VITAMIN B12: Vitamin B-12: >2000 pg/mL — ABNORMAL HIGH (ref 232–1245)

## 2022-05-26 ENCOUNTER — Other Ambulatory Visit: Payer: Self-pay

## 2022-05-26 DIAGNOSIS — E119 Type 2 diabetes mellitus without complications: Secondary | ICD-10-CM

## 2022-05-26 DIAGNOSIS — N184 Chronic kidney disease, stage 4 (severe): Secondary | ICD-10-CM | POA: Diagnosis not present

## 2022-05-26 DIAGNOSIS — E1122 Type 2 diabetes mellitus with diabetic chronic kidney disease: Secondary | ICD-10-CM | POA: Diagnosis not present

## 2022-05-27 LAB — MICROALBUMIN / CREATININE URINE RATIO
Creatinine, Urine: 40.2 mg/dL
Microalb/Creat Ratio: 37 mg/g creat — ABNORMAL HIGH (ref 0–29)
Microalbumin, Urine: 14.8 ug/mL

## 2022-05-27 LAB — BASIC METABOLIC PANEL
BUN/Creatinine Ratio: 32 — ABNORMAL HIGH (ref 12–28)
BUN: 66 mg/dL — ABNORMAL HIGH (ref 8–27)
CO2: 23 mmol/L (ref 20–29)
Calcium: 9.5 mg/dL (ref 8.7–10.3)
Chloride: 97 mmol/L (ref 96–106)
Creatinine, Ser: 2.05 mg/dL — ABNORMAL HIGH (ref 0.57–1.00)
Glucose: 68 mg/dL — ABNORMAL LOW (ref 70–99)
Potassium: 4.4 mmol/L (ref 3.5–5.2)
Sodium: 140 mmol/L (ref 134–144)
eGFR: 23 mL/min/{1.73_m2} — ABNORMAL LOW (ref >59)

## 2022-05-31 NOTE — Assessment & Plan Note (Signed)
Control: Fair Recommend check sugars fasting daily. Recommend check feet daily. Recommend annual eye exams. Medicines: Levemir 46 units in the morning and 24 units in the evenning. Continue to work on eating a healthy diet and exercise.

## 2022-05-31 NOTE — Assessment & Plan Note (Signed)
Well controlled.  No changes to medicines. Metoprolol 100 mg twice a day, Spironolactone 25 mg daily. Continue to work on eating a healthy diet and exercise.

## 2022-05-31 NOTE — Assessment & Plan Note (Signed)
Check labs 

## 2022-05-31 NOTE — Assessment & Plan Note (Signed)
The current medical regimen is effective;  continue present plan and medications.  Taking Pantoprazole 40  mg everyday.

## 2022-06-02 ENCOUNTER — Other Ambulatory Visit: Payer: Self-pay

## 2022-06-02 DIAGNOSIS — D631 Anemia in chronic kidney disease: Secondary | ICD-10-CM | POA: Diagnosis not present

## 2022-06-02 DIAGNOSIS — E785 Hyperlipidemia, unspecified: Secondary | ICD-10-CM | POA: Diagnosis not present

## 2022-06-02 DIAGNOSIS — I129 Hypertensive chronic kidney disease with stage 1 through stage 4 chronic kidney disease, or unspecified chronic kidney disease: Secondary | ICD-10-CM | POA: Diagnosis not present

## 2022-06-02 DIAGNOSIS — E876 Hypokalemia: Secondary | ICD-10-CM | POA: Diagnosis not present

## 2022-06-02 DIAGNOSIS — E1122 Type 2 diabetes mellitus with diabetic chronic kidney disease: Secondary | ICD-10-CM | POA: Diagnosis not present

## 2022-06-02 DIAGNOSIS — N184 Chronic kidney disease, stage 4 (severe): Secondary | ICD-10-CM | POA: Diagnosis not present

## 2022-06-02 DIAGNOSIS — I5030 Unspecified diastolic (congestive) heart failure: Secondary | ICD-10-CM | POA: Diagnosis not present

## 2022-06-02 MED ORDER — ALLOPURINOL 100 MG PO TABS: 50.0000 mg | ORAL_TABLET | ORAL | 1 refills | Status: DC

## 2022-06-09 ENCOUNTER — Ambulatory Visit
Admission: RE | Admit: 2022-06-09 | Discharge: 2022-06-09 | Disposition: A | Discharge status: Home / Self Care | Payer: Medicare Other | Source: Ambulatory Visit | Attending: Family Medicine | Admitting: Family Medicine

## 2022-06-09 DIAGNOSIS — Z1231 Encounter for screening mammogram for malignant neoplasm of breast: Secondary | ICD-10-CM | POA: Diagnosis not present

## 2022-06-10 ENCOUNTER — Ambulatory Visit (INDEPENDENT_AMBULATORY_CARE_PROVIDER_SITE_OTHER): Payer: Medicare Other | Admitting: Family Medicine

## 2022-06-10 ENCOUNTER — Encounter: Payer: Self-pay | Admitting: Family Medicine

## 2022-06-10 VITALS — BP 122/78 | HR 97 | Temp 97.0°F | Ht 62.5 in | Wt 154.0 lb

## 2022-06-10 DIAGNOSIS — M109 Gout, unspecified: Secondary | ICD-10-CM | POA: Diagnosis not present

## 2022-06-10 DIAGNOSIS — M79672 Pain in left foot: Secondary | ICD-10-CM

## 2022-06-10 MED ORDER — TRIAMCINOLONE ACETONIDE 40 MG/ML IJ SUSP
10.0000 mg | Freq: Once | INTRAMUSCULAR | Status: AC
  Administered 2022-06-10: 10 mg via INTRA_ARTICULAR

## 2022-06-10 NOTE — Progress Notes (Signed)
Acute Office Visit  Subjective:    Patient ID: Sara Hancock, female    DOB: 1935-04-19, 88 y.o.   MRN: [id number]  Chief Complaint  Patient presents with   Left foot sore    HPI Patient is in today for left foot pain. Treated for gout with kenalog shot. Helped, but came back.  Unable to take nsaids, tylenol.  Past Medical History:  Diagnosis Date   Acquired thrombophilia ([place]) 09/11/2019   Allergic rhinitis 09/17/2015   Anemia    ANEMIA, NORMOCYTIC 10/27/2007   Qualifier: Diagnosis of  By: Nils Pyle CMA (AAMA), Leisha     Anticoagulant long-term use 01/08/2011   Arthritis    Benign neoplasm of colon 01/08/2011   Biliary cirrhosis ([place])    BRADYCARDIA 11/15/2008   Qualifier: Diagnosis of  By: Hollie Salk CMA, Amanda     Cellulitis of left foot 09/25/2016   Cholelithiasis 01/09/12   Chronic kidney disease, stage 3 (HCC)    Combined form of age-related cataract, both eyes 02/26/2017   Diabetes mellitus type 2 without retinopathy ([place]) 10/27/2013   Diverticulosis of colon with hemorrhage 06/08/2000   Dyslipidemia 09/17/2015   Encounter for therapeutic drug monitoring 06/09/2013   Esophageal reflux 01/08/2011   Esophageal stenosis 01/08/11   Esophagitis 01/08/11   GALLSTONES 10/26/2008   Qualifier: Diagnosis of  By: Sharlett Iles MD Byrd Hesselbach    Gastritis, erosive chronic 01/08/2011   GERD (gastroesophageal reflux disease)    Glaucoma suspect of both eyes 02/26/2017   Glaucoma suspect of both eyes 02/26/2017   Hiatal hernia 01/08/11   History of IBS 10/27/2007   Qualifier: Diagnosis of  By: Nils Pyle CMA (AAMA), Mearl Latin     HTN (hypertension)    Hx of adenomatous colonic polyps 01/08/11   Hyperplastic colon polyp 06/08/2000, 07/31/2003, 01/08/11   Hypertension, essential 10/27/2007   Qualifier: Diagnosis of  By: Nils Pyle CMA (AAMA), Mearl Latin     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, with stage 1 through stage 4 chronic kidney disease, or unspecified chronic kidney disease     Iron deficiency anemia 01/08/2011   Left foot pain 09/25/2016   Localized edema    Long term (current) use of insulin (HCC)    Mixed hyperlipidemia    Nuclear cataract 07/03/2011   Obesity    Obesity (BMI 30-39.9) 03/22/2012   Osteoarthritis of right thumb 09/17/2015   Osteopenia    Other iron deficiency anemias    Other specified menopausal and perimenopausal disorders    PAF (paroxysmal atrial fibrillation) (HCC)    occurring in the setting of bradycardia and mild first degree AV block followed by Dr. 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/27/2007   Qualifier: Diagnosis of  By: Nils Pyle CMA (AAMA), Mearl Latin     Pure hypercholesterolemia    Retinal tear, right 07/03/2011   Shortness of breath 12/13/2019   Stage 3b chronic kidney disease ([place]) 09/11/2019   Type II or unspecified type diabetes mellitus without mention of complication, not stated as uncontrolled    Uterine fibroid 01/09/12    Past Surgical History:  Procedure Laterality Date   BREAST CYST EXCISION     left   CARDIOVERSION N/A 11/27/2017   Procedure: CARDIOVERSION;  Surgeon: Sanda Klein, MD;  Location: [facility];  Service: Cardiovascular;  Laterality: N/A;   COLONOSCOPY     INSERTION OF MESH  05/18/2012   Procedure: INSERTION OF MESH;  Surgeon: Adin Hector, MD;  Location: [facility];  Service: General;  Laterality: N/A;   LIVER BIOPSY  05/18/2012   Procedure: LIVER BIOPSY;  Surgeon: Adin Hector, MD;  Location: [facility];  Service: General;;   POLYPECTOMY     THYROIDECTOMY, [place] HERNIA REPAIR  05/18/2012   Procedure: LAPAROSCOPIC VENTRAL HERNIA;  Surgeon: Adin Hector, MD;  Location: [facility];  Service: General;  Laterality: N/A;  Laparoscopic Ventral Wall Hernia with Mesh     Family History  Problem Relation Age of Onset   Stroke Mother 60   Heart attack Father    Diabetes Sister        x 2   Aneurysm Sister    Stroke Sister    Congestive Heart Failure Sister    Diabetes Brother    Kidney failure Other    Hyperlipidemia Other    Arrhythmia Other    Congestive Heart Failure Other    Hypertension Other    Arthritis Other    Colon cancer Neg Hx    Esophageal cancer Neg Hx    Stomach cancer Neg Hx    Rectal cancer Neg Hx     Social History   Socioeconomic History   Marital status: Married    Spouse name: Not on file   Number of children: 2   Years of education: Not on file   Highest education level: Not on file  Occupational History   Occupation: retired    Fish farm manager: RETIRED  Tobacco Use   Smoking status: Never   Smokeless tobacco: Never  Vaping Use   Vaping Use: Never used  Substance and Sexual Activity   Alcohol use: No   Drug use: No   Sexual activity: Not Currently  Other Topics Concern   Not on file  Social History Narrative   Not on file   Social Determinants of Health   Financial Resource Strain: Low Risk  (08/12/2021)   Overall Financial Resource Strain (CARDIA)    Difficulty of Paying Living Expenses: Not hard at all  Food Insecurity: No Food Insecurity (01/08/2022)   Hunger Vital Sign    Worried About Running Out of Food in the Last Year: Never true    Ran Out of Food in the Last Year: Never true  Transportation Needs: No Transportation Needs (01/08/2022)   PRAPARE - Hydrologist (Medical): No    Lack of Transportation (Non-Medical): No  Physical Activity: Not on file  Stress: Not on file  Social Connections: Not on file  Intimate Partner Violence: Not At Risk (01/08/2022)   Humiliation, Afraid, Rape, and Kick questionnaire    Fear of Current or Ex-Partner: No    Emotionally Abused: No    Physically Abused: No    Sexually Abused: No    Outpatient Medications Prior to Visit   Medication Sig Dispense Refill   Accu-Chek FastClix Lancets MISC CHECKING SUGARS TWICE DAILY. 102 each 3   allopurinol (ZYLOPRIM) 100 MG tablet Take 0.5 tablets (50 mg total) by mouth every other day. 45 tablet 1   amLODipine (NORVASC) 2.5 MG tablet Take 1 tablet (2.5 mg total) by mouth daily. 90 tablet 0   atorvastatin (LIPITOR) 40 MG tablet Take 1 tablet (40 mg total) by mouth daily. 90 tablet 1   calcium-vitamin  D (OSCAL WITH D) 250-125 MG-UNIT per tablet Take 1 tablet by mouth 2 (two) times daily.     Cholecalciferol (VITAMIN D3) 1000 units CAPS Take 1,000 Units by mouth daily.     ELIQUIS 2.5 MG TABS tablet TAKE 1 TABLET(2.5 MG) BY MOUTH TWICE DAILY 180 tablet 1   fexofenadine (ALLEGRA ALLERGY) 60 MG tablet Take 1 tablet (60 mg total) by mouth 2 (two) times daily. (Patient taking differently: Take 60 mg by mouth daily.) 180 tablet 1   fluticasone (FLONASE) 50 MCG/ACT nasal spray Place 1 spray into both nostrils as needed.     folic acid (FOLVITE) 161 MCG tablet Take 400 mcg by mouth daily.     glucose blood (ACCU-CHEK GUIDE) test strip Use as instructed twice daily. E11.9 200 each 3   insulin detemir (LEVEMIR FLEXPEN) 100 UNIT/ML FlexPen INJECT 48 UNITS UNDER THE SKIN EVERY MORNING AND 26 UNITS WITH SUPPER 54 mL 1   Insulin Pen Needle (BD PEN NEEDLE NANO 2ND GEN) 32G X 4 MM MISC USE A NEEDLE EACH TIME WHEN CHECKING FBS 200 each 3   metolazone (ZAROXOLYN) 5 MG tablet TAKE 1 TABLET(5 MG) BY MOUTH 1 TIME A WEEK 4 tablet 8   metoprolol tartrate (LOPRESSOR) 100 MG tablet Take 1 twice daily 180 tablet 3   Multiple Vitamin (MULTIVITAMIN WITH MINERALS) TABS Take 1 tablet by mouth daily.     nystatin cream (MYCOSTATIN) APPLY SUFFICIENT AMOUNT TOPICALLY TO THE AFFECTED AREA TWICE DAILY 30 g 1   pantoprazole (PROTONIX) 40 MG tablet TAKE 1 TABLET BY MOUTH EVERY DAY 90 tablet 1   potassium chloride SA (KLOR-CON M) 20 MEQ tablet TAKE 1 TABLET(20 MEQ) BY MOUTH TWICE DAILY (Patient taking differently: Take  20 mEq by mouth daily.) 180 tablet 1   spironolactone (ALDACTONE) 25 MG tablet Take 25 mg by mouth daily.     torsemide (DEMADEX) 20 MG tablet TAKE 2 TABLETS BY MOUTH TWICE DAILY (Patient taking differently: 20 mg daily.) 360 tablet 0   ursodiol (ACTIGALL) 300 MG capsule TAKE 1 CAPSULE(300 MG) BY MOUTH TWICE DAILY 180 capsule 0   vitamin C (ASCORBIC ACID) 500 MG tablet Take 500 mg by mouth daily.     vitamin E 400 UNIT capsule Take 400 Units by mouth daily.     fexofenadine (ALLEGRA ALLERGY) 180 MG tablet 1 tablet as needed Orally Once a day     No facility-administered medications prior to visit.    Allergies  Allergen Reactions   Ace Inhibitors Anaphylaxis   Capoten [Captopril] Anaphylaxis    Throat swells shut   Neomycin-Bacitracin Zn-Polymyx Rash    Review of Systems  Constitutional:  Negative for appetite change, fatigue and fever.  HENT:  Negative for congestion, ear pain, sinus pressure and sore throat.   Respiratory:  Negative for cough, chest tightness, shortness of breath and wheezing.   Cardiovascular:  Negative for chest pain and palpitations.  Gastrointestinal:  Negative for abdominal pain, constipation, diarrhea, nausea and vomiting.  Genitourinary:  Negative for dysuria and hematuria.  Musculoskeletal:  Positive for myalgias (Left foot pain). Negative for arthralgias, back pain and joint swelling.  Skin:  Negative for rash.  Neurological:  Negative for dizziness, weakness and headaches.  Psychiatric/Behavioral:  Negative for dysphoric mood. The patient is not nervous/anxious.        Objective:    Physical Exam Vitals reviewed.  Constitutional:      Appearance: Normal appearance.  Musculoskeletal:        General: Tenderness (  left foot around 2nd/3rd toes.) present.  Neurological:     Mental Status: She is alert.     BP 122/78 (BP Location: Left Arm, Patient Position: Sitting)   Pulse 97   Temp (!) 97 F (36.1 C) (Temporal)   Ht 5' 2.5" (1.588 m)   Wt  154 lb (69.9 kg)   SpO2 99%   BMI 27.72 kg/m  Wt Readings from Last 3 Encounters:  06/10/22 154 lb (69.9 kg)  05/22/22 155 lb (70.3 kg)  05/02/22 153 lb (69.4 kg)    Health Maintenance Due  Topic Date Due   COVID-19 Vaccine (11 - 2023-24 season) 04/23/2022    There are no preventive care reminders to display for this patient.   Lab Results  Component Value Date   TSH 2.020 09/10/2020   Lab Results  Component Value Date   WBC 7.4 05/19/2022   HGB 10.2 (L) 05/19/2022   HCT 30.8 (L) 05/19/2022   MCV 91 05/19/2022   PLT 244 05/19/2022   Lab Results  Component Value Date   NA 140 05/26/2022   K 4.4 05/26/2022   CO2 23 05/26/2022   GLUCOSE 68 (L) 05/26/2022   BUN 66 (H) 05/26/2022   CREATININE 2.05 (H) 05/26/2022   BILITOT 0.4 05/19/2022   ALKPHOS 165 (H) 05/19/2022   AST 23 05/19/2022   ALT 14 05/19/2022   PROT 7.1 05/19/2022   ALBUMIN 3.7 05/19/2022   CALCIUM 9.5 05/26/2022   EGFR 23 (L) 05/26/2022   GFR 19.24 (L) 02/24/2022   Lab Results  Component Value Date   CHOL 129 05/19/2022   Lab Results  Component Value Date   HDL 51 05/19/2022   Lab Results  Component Value Date   LDLCALC 63 05/19/2022   Lab Results  Component Value Date   TRIG 77 05/19/2022   Lab Results  Component Value Date   CHOLHDL 2.5 05/19/2022   Lab Results  Component Value Date   HGBA1C 7.1 (H) 05/19/2022         Assessment & Plan:   Acute gout of left foot, unspecified cause Assessment & Plan: Given kenalog shot In foot.  If does not improve in 3 days, call and will give you low dose of prednisone.   Procedure: Risks were discussed including bleeding, infection, increase in sugars if diabetic, atrophy at site of injection, and increased pain.  After consent was obtained, using sterile technique the space between 2nd and 3rd metatarsal heads were prepped with alcohol and injected with kenalog 10 mg and 1 ml plain Lidocaine was then injected and the needle withdrawn.   The procedure was well tolerated.   It may be more painful for the first 1-2 days.  Watch for fever, or increased swelling or persistent pain in the joint.  Call or return to clinic prn if such symptoms occur or there is failure to improve as anticipated.   Orders: -     Triamcinolone Acetonide    Meds ordered this encounter  Medications   triamcinolone acetonide (KENALOG-40) injection 10 mg    I,Lauren M Auman,acting as a scribe for Rochel Brome, MD.,have documented all relevant documentation on the behalf of Rochel Brome, MD,as directed by  Rochel Brome, MD while in the presence of Rochel Brome, MD.   Rochel Brome, MD

## 2022-06-10 NOTE — Patient Instructions (Signed)
Given kenalog shot In foot.   If does not improve in 3 days, call and will give you low dose of prednisone.

## 2022-06-15 DIAGNOSIS — M109 Gout, unspecified: Secondary | ICD-10-CM | POA: Insufficient documentation

## 2022-06-15 NOTE — Assessment & Plan Note (Addendum)
Given kenalog shot In foot.  If does not improve in 3 days, call and will give you low dose of prednisone.   Procedure: Risks were discussed including bleeding, infection, increase in sugars if diabetic, atrophy at site of injection, and increased pain.  After consent was obtained, using sterile technique the space between 2nd and 3rd metatarsal heads were prepped with alcohol and injected with kenalog 10 mg and 1 ml plain Lidocaine was then injected and the needle withdrawn.  The procedure was well tolerated.   It may be more painful for the first 1-2 days.  Watch for fever, or increased swelling or persistent pain in the joint.  Call or return to clinic prn if such symptoms occur or there is failure to improve as anticipated.

## 2022-06-16 ENCOUNTER — Other Ambulatory Visit: Payer: Self-pay

## 2022-06-16 MED ORDER — PREDNISONE 10 MG PO TABS: 10.0000 mg | ORAL_TABLET | Freq: Every day | ORAL | 0 refills | Status: DC

## 2022-06-16 NOTE — Telephone Encounter (Signed)
Sara Hancock called to report that her foot is less painful but not completely well.  Dr. Tobie Poet advised prednisone 10 mg daily for 5 days.

## 2022-06-17 ENCOUNTER — Other Ambulatory Visit: Payer: Self-pay | Admitting: Family Medicine

## 2022-06-17 DIAGNOSIS — E1159 Type 2 diabetes mellitus with other circulatory complications: Secondary | ICD-10-CM

## 2022-07-10 DIAGNOSIS — E119 Type 2 diabetes mellitus without complications: Secondary | ICD-10-CM | POA: Diagnosis not present

## 2022-07-10 DIAGNOSIS — H40003 Preglaucoma, unspecified, bilateral: Secondary | ICD-10-CM | POA: Diagnosis not present

## 2022-07-10 DIAGNOSIS — H43813 Vitreous degeneration, bilateral: Secondary | ICD-10-CM | POA: Diagnosis not present

## 2022-07-10 DIAGNOSIS — H33311 Horseshoe tear of retina without detachment, right eye: Secondary | ICD-10-CM | POA: Diagnosis not present

## 2022-07-10 LAB — HM DIABETES EYE EXAM

## 2022-07-17 ENCOUNTER — Telehealth: Payer: Self-pay

## 2022-07-17 NOTE — Patient Outreach (Signed)
  Care Coordination   Initial Visit Note   07/17/2022 Name: CRYSTALE PFOHL MRN: [id number] DOB: [birth year]  DESEREE JARMIN is a 88 y.o. year old female who sees Cox, Kirsten, MD for primary care. I spoke with  [name] by phone today.  What matters to the patients health and wellness today?  Placed call to patient today to review and offer [facility] care coordination program. Patient denies needs.       SDOH assessments and interventions completed:  No     Care Coordination Interventions:  No, not indicated   Follow up plan: No further intervention required.   Encounter Outcome:  Pt. Refused   Tomasa Rand, RN, BSN, CEN [facility] [employer] [phone]

## 2022-08-01 ENCOUNTER — Other Ambulatory Visit: Payer: Self-pay | Admitting: Cardiology

## 2022-08-01 ENCOUNTER — Other Ambulatory Visit: Payer: Self-pay | Admitting: Family Medicine

## 2022-08-01 ENCOUNTER — Other Ambulatory Visit: Payer: Self-pay | Admitting: Gastroenterology

## 2022-08-01 DIAGNOSIS — I480 Paroxysmal atrial fibrillation: Secondary | ICD-10-CM

## 2022-08-01 NOTE — Telephone Encounter (Signed)
Prescription refill request for Eliquis received.  Indication: afib  Last office visit:[place], 02/24/2022 Scr: 2.05, 05/26/2022 Age: 88 yo  Weight: 69.9 kg   Refill sent.

## 2022-08-24 ENCOUNTER — Other Ambulatory Visit: Payer: Self-pay | Admitting: Family Medicine

## 2022-08-27 ENCOUNTER — Telehealth: Payer: Self-pay

## 2022-08-27 DIAGNOSIS — K745 Biliary cirrhosis, unspecified: Secondary | ICD-10-CM

## 2022-08-27 NOTE — Telephone Encounter (Signed)
-----   Message from Lucky Cowboy, RN sent at 02/25/2022  9:26 AM EDT ----- Patient needs 6 month [place]. Order needs to be placed. Refer to imaging result note 02/24/22.

## 2022-08-27 NOTE — Telephone Encounter (Signed)
Left message on machine to call back  

## 2022-08-27 NOTE — Telephone Encounter (Signed)
The pt has been advised that she is due for [place]. Order has been placed and sent to the schedulers.

## 2022-08-31 ENCOUNTER — Other Ambulatory Visit: Payer: Self-pay

## 2022-08-31 DIAGNOSIS — I131 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, with stage 1 through stage 4 chronic kidney disease, or unspecified chronic kidney disease: Secondary | ICD-10-CM

## 2022-08-31 DIAGNOSIS — E782 Mixed hyperlipidemia: Secondary | ICD-10-CM

## 2022-08-31 DIAGNOSIS — E119 Type 2 diabetes mellitus without complications: Secondary | ICD-10-CM

## 2022-09-01 ENCOUNTER — Other Ambulatory Visit: Payer: Medicare Other

## 2022-09-01 DIAGNOSIS — E782 Mixed hyperlipidemia: Secondary | ICD-10-CM | POA: Diagnosis not present

## 2022-09-01 DIAGNOSIS — E119 Type 2 diabetes mellitus without complications: Secondary | ICD-10-CM | POA: Diagnosis not present

## 2022-09-01 DIAGNOSIS — I131 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, with stage 1 through stage 4 chronic kidney disease, or unspecified chronic kidney disease: Secondary | ICD-10-CM | POA: Diagnosis not present

## 2022-09-02 LAB — COMPREHENSIVE METABOLIC PANEL
ALT: 16 IU/L (ref 0–32)
AST: 24 IU/L (ref 0–40)
Albumin/Globulin Ratio: 1.1 — ABNORMAL LOW (ref 1.2–2.2)
Albumin: 3.8 g/dL (ref 3.7–4.7)
Alkaline Phosphatase: 147 IU/L — ABNORMAL HIGH (ref 44–121)
BUN/Creatinine Ratio: 34 — ABNORMAL HIGH (ref 12–28)
BUN: 84 mg/dL (ref 8–27)
Bilirubin Total: 0.4 mg/dL (ref 0.0–1.2)
CO2: 28 mmol/L (ref 20–29)
Calcium: 10.2 mg/dL (ref 8.7–10.3)
Chloride: 96 mmol/L (ref 96–106)
Creatinine, Ser: 2.5 mg/dL — ABNORMAL HIGH (ref 0.57–1.00)
Globulin, Total: 3.5 g/dL (ref 1.5–4.5)
Glucose: 150 mg/dL — ABNORMAL HIGH (ref 70–99)
Potassium: 4.3 mmol/L (ref 3.5–5.2)
Sodium: 142 mmol/L (ref 134–144)
Total Protein: 7.3 g/dL (ref 6.0–8.5)
eGFR: 18 mL/min/{1.73_m2} — ABNORMAL LOW (ref >59)

## 2022-09-02 LAB — CBC WITH DIFFERENTIAL/PLATELET
Basophils Absolute: 0 10*3/uL (ref 0.0–0.2)
Basos: 0 %
EOS (ABSOLUTE): 0.1 10*3/uL (ref 0.0–0.4)
Eos: 1 %
Hematocrit: 31.5 % — ABNORMAL LOW (ref 34.0–46.6)
Hemoglobin: 10.8 g/dL — ABNORMAL LOW (ref 11.1–15.9)
Immature Grans (Abs): 0 10*3/uL (ref 0.0–0.1)
Immature Granulocytes: 0 %
Lymphocytes Absolute: 1.5 10*3/uL (ref 0.7–3.1)
Lymphs: 18 %
MCH: 31.4 pg (ref 26.6–33.0)
MCHC: 34.3 g/dL (ref 31.5–35.7)
MCV: 92 fL (ref 79–97)
Monocytes Absolute: 0.9 10*3/uL (ref 0.1–0.9)
Monocytes: 10 %
Neutrophils Absolute: 6 10*3/uL (ref 1.4–7.0)
Neutrophils: 71 %
Platelets: 187 10*3/uL (ref 150–450)
RBC: 3.44 x10E6/uL — ABNORMAL LOW (ref 3.77–5.28)
RDW: 14.4 % (ref 11.7–15.4)
WBC: 8.5 10*3/uL (ref 3.4–10.8)

## 2022-09-02 LAB — LIPID PANEL
Chol/HDL Ratio: 2.2 ratio (ref 0.0–4.4)
Cholesterol, Total: 134 mg/dL (ref 100–199)
HDL: 60 mg/dL (ref >39)
LDL Chol Calc (NIH): 61 mg/dL (ref 0–99)
Triglycerides: 63 mg/dL (ref 0–149)
VLDL Cholesterol Cal: 13 mg/dL (ref 5–40)

## 2022-09-02 LAB — HEMOGLOBIN A1C
Est. average glucose Bld gHb Est-mCnc: 192 mg/dL
Hgb A1c MFr Bld: 8.3 % — ABNORMAL HIGH (ref 4.8–5.6)

## 2022-09-02 LAB — CARDIOVASCULAR RISK ASSESSMENT

## 2022-09-03 ENCOUNTER — Ambulatory Visit (HOSPITAL_COMMUNITY): Admission: RE | Admit: 2022-09-03 | Payer: Medicare Other | Source: Ambulatory Visit

## 2022-09-03 ENCOUNTER — Telehealth: Payer: Self-pay | Admitting: Cardiology

## 2022-09-03 NOTE — Telephone Encounter (Signed)
Pt c/o swelling: STAT is pt has developed SOB within 24 hours  If swelling, where is the swelling located? Both feet and legs swollen more than usual for the past couple of weeks.   How much weight have you gained and in what time span? 4-5 lbs over the last couple of weeks.   Have you gained 3 pounds in a day or 5 pounds in a week? No   Do you have a log of your daily weights (if so, list)? 4/17 157lbs, 4/18 159lbs, 4/19 159.2lbs, 4/20 159.6lbs, 4/21 159.0lbs (took an extra water pill), 4/22 159lbs, 4/23 159.6lbs, 4/24 159.8lbs (patient states normally her weight is 153lbs or 154lbs)  Are you currently taking a fluid pill? Yes   Are you currently SOB? Patient states she is SOB more than normal, when she walks a distance for the past week.   Have you traveled recently? Just trips to [place].

## 2022-09-04 ENCOUNTER — Ambulatory Visit (INDEPENDENT_AMBULATORY_CARE_PROVIDER_SITE_OTHER): Payer: Medicare Other | Admitting: Family Medicine

## 2022-09-04 VITALS — BP 110/72 | HR 72 | Temp 96.3°F | Ht 62.5 in | Wt 161.4 lb

## 2022-09-04 DIAGNOSIS — E782 Mixed hyperlipidemia: Secondary | ICD-10-CM

## 2022-09-04 DIAGNOSIS — E1122 Type 2 diabetes mellitus with diabetic chronic kidney disease: Secondary | ICD-10-CM

## 2022-09-04 DIAGNOSIS — I131 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, with stage 1 through stage 4 chronic kidney disease, or unspecified chronic kidney disease: Secondary | ICD-10-CM

## 2022-09-04 DIAGNOSIS — Z794 Long term (current) use of insulin: Secondary | ICD-10-CM | POA: Diagnosis not present

## 2022-09-04 DIAGNOSIS — D6869 Other thrombophilia: Secondary | ICD-10-CM

## 2022-09-04 DIAGNOSIS — N184 Chronic kidney disease, stage 4 (severe): Secondary | ICD-10-CM | POA: Diagnosis not present

## 2022-09-04 DIAGNOSIS — K743 Primary biliary cirrhosis: Secondary | ICD-10-CM | POA: Diagnosis not present

## 2022-09-04 DIAGNOSIS — D509 Iron deficiency anemia, unspecified: Secondary | ICD-10-CM

## 2022-09-04 DIAGNOSIS — K219 Gastro-esophageal reflux disease without esophagitis: Secondary | ICD-10-CM | POA: Diagnosis not present

## 2022-09-04 NOTE — Patient Instructions (Signed)
Increase insulin to 50 U in am and continue 24 U in pm.

## 2022-09-04 NOTE — Progress Notes (Signed)
Subjective:  Patient ID: Sara Hancock, female    DOB: [birth year]  Age: 88 y.o. MRN: [id number]  Chief Complaint  Patient presents with   Diabetes   Chronic Kidney Disease    HPI Diabetes:  Complications: Hyperlipidemia Glucose checking: twice a day Glucose logs: 66-95  morning and 120-130s evening Hypoglycemia: early morning (eats a early morning snack before breakfast, when she gets up to go to the bathroom).   Most recent A1C: 9.3 Current medications: Levemir 46 units in the morning and 24 units in the evenning. Last Eye Exam: Feb. 2024.   Foot checks: daily  Hyperlipidemia: Current medications: Atorvastatin 40 mg daily  Hypertension: Complications: Diabetes. Current medications: Metoprolol 100 mg twice a day, Spironolactone 25 mg daily and Torsemide 40 mg twice daily.    Afib: Taking Eliquis 2.5 mg twice a day       05/22/2022   10:57 AM 01/08/2022    9:49 AM 05/14/2021    2:07 PM 03/28/2021   10:48 AM 12/20/2020   10:34 AM  Depression screen PHQ 2/9  Decreased Interest 0 0 0 0 0  Down, Depressed, Hopeless 0 0 0 0 0  PHQ - 2 Score 0 0 0 0 0         12/20/2020   10:34 AM 03/28/2021   10:47 AM 05/14/2021    2:07 PM 01/08/2022    9:51 AM 05/22/2022   10:57 AM  Fall Risk  Falls in the past year? 0 0 0 0 0  Was there an injury with Fall? 0 0 0 0 0  Fall Risk Category Calculator 0 0 0 0 0  Fall Risk Category (Retired) Low Low Low Low Low  (RETIRED) Patient Fall Risk Level Low fall risk   Low fall risk Low fall risk  Patient at Risk for Falls Due to No Fall Risks   No Fall Risks No Fall Risks  Fall risk Follow up Falls evaluation completed Falls evaluation completed  Falls evaluation completed;Falls prevention discussed;Education provided Falls evaluation completed      Review of Systems  Constitutional:  Negative for chills, fatigue and fever.  HENT:  Positive for congestion. Negative for rhinorrhea and sore throat.   Respiratory:  Positive for shortness of  breath. Negative for cough.   Cardiovascular:  Positive for leg swelling. Negative for chest pain.  Gastrointestinal:  Negative for abdominal pain, constipation, diarrhea, nausea and vomiting.  Genitourinary:  Negative for dysuria and urgency.  Musculoskeletal:  Positive for back pain. Negative for myalgias.  Neurological:  Negative for dizziness, weakness, light-headedness and headaches.  Psychiatric/Behavioral:  Negative for dysphoric mood. The patient is not nervous/anxious.     Current Outpatient Medications on File Prior to Visit  Medication Sig Dispense Refill   Accu-Chek FastClix Lancets MISC CHECKING SUGARS TWICE DAILY. 102 each 3   allopurinol (ZYLOPRIM) 100 MG tablet Take 0.5 tablets (50 mg total) by mouth every other day. 45 tablet 1   atorvastatin (LIPITOR) 40 MG tablet Take 1 tablet (40 mg total) by mouth daily. 90 tablet 1   calcium-vitamin D (OSCAL WITH D) 250-125 MG-UNIT per tablet Take 1 tablet by mouth 2 (two) times daily.     Cholecalciferol (VITAMIN D3) 1000 units CAPS Take 1,000 Units by mouth daily.     ELIQUIS 2.5 MG TABS tablet TAKE 1 TABLET(2.5 MG) BY MOUTH TWICE DAILY 180 tablet 1   fexofenadine (ALLEGRA ALLERGY) 60 MG tablet Take 1 tablet (60 mg total) by mouth 2 (two) times  daily. (Patient taking differently: Take 60 mg by mouth daily.) 180 tablet 1   fluticasone (FLONASE) 50 MCG/ACT nasal spray Place 1 spray into both nostrils as needed.     folic acid (FOLVITE) 400 MCG tablet Take 400 mcg by mouth daily.     glucose blood (ACCU-CHEK GUIDE) test strip USE AS INSTRUCTED TWICE DAILY 200 strip 3   insulin detemir (LEVEMIR FLEXPEN) 100 UNIT/ML FlexPen INJECT 48 UNITS UNDER THE SKIN EVERY MORNING AND 26 UNITS WITH SUPPER (Patient taking differently: INJECT 46 UNITS UNDER THE SKIN EVERY MORNING AND 26 UNITS WITH SUPPER) 54 mL 1   Insulin Pen Needle (BD PEN NEEDLE NANO 2ND GEN) 32G X 4 MM MISC USE A NEEDLE EACH TIME FOR INSULIN INJECTION 200 each 1   metolazone  (ZAROXOLYN) 5 MG tablet TAKE 1 TABLET(5 MG) BY MOUTH 1 TIME A WEEK 4 tablet 8   metoprolol tartrate (LOPRESSOR) 100 MG tablet TAKE 1 TABLET(100 MG) BY MOUTH TWICE DAILY 180 tablet 0   Multiple Vitamin (MULTIVITAMIN WITH MINERALS) TABS Take 1 tablet by mouth daily.     nystatin cream (MYCOSTATIN) APPLY SUFFICIENT AMOUNT TOPICALLY TO THE AFFECTED AREA TWICE DAILY 30 g 1   pantoprazole (PROTONIX) 40 MG tablet TAKE 1 TABLET BY MOUTH EVERY DAY 90 tablet 1   potassium chloride SA (KLOR-CON M) 20 MEQ tablet TAKE 1 TABLET(20 MEQ) BY MOUTH TWICE DAILY (Patient taking differently: Take 20 mEq by mouth daily.) 180 tablet 1   spironolactone (ALDACTONE) 25 MG tablet Take 25 mg by mouth daily.     torsemide (DEMADEX) 20 MG tablet TAKE 2 TABLETS BY MOUTH TWICE DAILY (Patient taking differently: 20 mg daily.) 360 tablet 0   ursodiol (ACTIGALL) 300 MG capsule TAKE 1 CAPSULE(300 MG) BY MOUTH TWICE DAILY 180 capsule 0   vitamin C (ASCORBIC ACID) 500 MG tablet Take 500 mg by mouth daily.     vitamin E 400 UNIT capsule Take 400 Units by mouth daily.     No current facility-administered medications on file prior to visit.   Past Medical History:  Diagnosis Date   Acquired thrombophilia (HCC) 09/11/2019   Allergic rhinitis 09/17/2015   Anemia    ANEMIA, NORMOCYTIC 10/27/2007   Qualifier: Diagnosis of  By: Koleen Distance CMA (AAMA), Leisha     Anticoagulant long-term use 01/08/2011   Arthritis    Benign neoplasm of colon 01/08/2011   Biliary cirrhosis (HCC)    BRADYCARDIA 11/15/2008   Qualifier: Diagnosis of  By: Wyn Quaker CMA, Amanda     Cellulitis of left foot 09/25/2016   Cholelithiasis 01/09/12   Chronic kidney disease, stage 3 (HCC)    Combined form of age-related cataract, both eyes 02/26/2017   Diabetes mellitus type 2 without retinopathy (HCC) 10/27/2013   Diverticulosis of colon with hemorrhage 06/08/2000   Dyslipidemia 09/17/2015   Encounter for therapeutic drug monitoring 06/09/2013   Esophageal reflux 01/08/2011    Esophageal stenosis 01/08/11   Esophagitis 01/08/11   GALLSTONES 10/26/2008   Qualifier: Diagnosis of  By: Jarold Motto MD Lang Snow    Gastritis, erosive chronic 01/08/2011   GERD (gastroesophageal reflux disease)    Glaucoma suspect of both eyes 02/26/2017   Glaucoma suspect of both eyes 02/26/2017   Hiatal hernia 01/08/11   History of IBS 10/27/2007   Qualifier: Diagnosis of  By: Koleen Distance CMA (AAMA), Leisha     HTN (hypertension)    Hx of adenomatous colonic polyps 01/08/11   Hyperplastic colon polyp 06/08/2000, 07/31/2003, 01/08/11   Hypertension,  essential 10/27/2007   Qualifier: Diagnosis of  By: Koleen Distance CMA Duncan Dull), Hulan Saas     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, with stage 1 through stage 4 chronic kidney disease, or unspecified chronic kidney disease    Iron deficiency anemia 01/08/2011   Left foot pain 09/25/2016   Localized edema    Long term (current) use of insulin (HCC)    Mixed hyperlipidemia    Nuclear cataract 07/03/2011   Obesity    Obesity (BMI 30-39.9) 03/22/2012   Osteoarthritis of right thumb 09/17/2015   Osteopenia    Other iron deficiency anemias    Other specified menopausal and perimenopausal disorders    PAF (paroxysmal atrial fibrillation) (HCC)    occurring in the setting of bradycardia and mild first degree AV block followed by Dr. Clide Cliff   Pain in thoracic spine    Paraesophageal hiatal hernia, 3cm by EGD 03/22/2012   Pedal edema 12/13/2019   Persistent atrial fibrillation (HCC) 08/28/2019   Posterior vitreous detachment, bilateral 01/10/2016   Primary biliary cholangitis (HCC) 10/27/2007   Qualifier: Diagnosis of  By: Koleen Distance CMA (AAMA), Hulan Saas     Pure hypercholesterolemia    Retinal tear, right 07/03/2011   Shortness of breath 12/13/2019   Stage 3b chronic kidney disease (HCC) 09/11/2019   Type II or unspecified type diabetes mellitus without mention of complication, not stated as uncontrolled    Uterine fibroid 01/09/12   Past Surgical  History:  Procedure Laterality Date   BREAST CYST EXCISION     left   CARDIOVERSION N/A 11/27/2017   Procedure: CARDIOVERSION;  Surgeon: Thurmon Fair, MD;  Location: [facility];  Service: Cardiovascular;  Laterality: N/A;   COLONOSCOPY     INSERTION OF MESH  05/18/2012   Procedure: INSERTION OF MESH;  Surgeon: Ardeth Sportsman, MD;  Location: [facility];  Service: General;  Laterality: N/A;   LIVER BIOPSY  05/18/2012   Procedure: LIVER BIOPSY;  Surgeon: Ardeth Sportsman, MD;  Location: [facility];  Service: General;;   POLYPECTOMY     THYROIDECTOMY, PARTIAL  1980   TUBAL LIGATION  1972   VENTRAL HERNIA REPAIR  05/18/2012   Procedure: LAPAROSCOPIC VENTRAL HERNIA;  Surgeon: Ardeth Sportsman, MD;  Location: [facility];  Service: General;  Laterality: N/A;  Laparoscopic Ventral Wall Hernia with Mesh    Family History  Problem Relation Age of Onset   Stroke Mother 43   Heart attack Father    Diabetes Sister        x 2   Aneurysm Sister    Stroke Sister    Congestive Heart Failure Sister    Diabetes Brother    Kidney failure Other    Hyperlipidemia Other    Arrhythmia Other    Congestive Heart Failure Other    Hypertension Other    Arthritis Other    Colon cancer Neg Hx    Esophageal cancer Neg Hx    Stomach cancer Neg Hx    Rectal cancer Neg Hx    Social History   Socioeconomic History   Marital status: Married    Spouse name: Not on file   Number of children: 2   Years of education: Not on file   Highest education level: Not on file  Occupational History   Occupation: retired    Associate Professor: RETIRED  Tobacco Use   Smoking status: Never   Smokeless tobacco: Never  Vaping Use   Vaping Use: Never used  Substance and Sexual Activity  Alcohol use: No   Drug use: No   Sexual activity: Not Currently  Other Topics Concern   Not on file  Social History Narrative   Not on file   Social Determinants of Health   Financial Resource Strain: Low Risk  (08/12/2021)   Overall Financial  Resource Strain (CARDIA)    Difficulty of Paying Living Expenses: Not hard at all  Food Insecurity: No Food Insecurity (01/08/2022)   Hunger Vital Sign    Worried About Running Out of Food in the Last Year: Never true    Ran Out of Food in the Last Year: Never true  Transportation Needs: No Transportation Needs (01/08/2022)   PRAPARE - Administrator, Civil Service (Medical): No    Lack of Transportation (Non-Medical): No  Physical Activity: Not on file  Stress: Not on file  Social Connections: Not on file    Objective:  BP 110/72   Pulse 72   Temp (!) 96.3 F (35.7 C)   Ht 5' 2.5" (1.588 m)   Wt 161 lb 6.4 oz (73.2 kg)   BMI 29.05 kg/m      09/04/2022   10:38 AM 06/10/2022    4:08 PM 05/22/2022   10:52 AM  BP/Weight  Systolic BP 110 122 124  Diastolic BP 72 78 72  Wt. (Lbs) 161.4 154 155  BMI 29.05 kg/m2 27.72 kg/m2 27.9 kg/m2    Physical Exam Vitals reviewed.  Constitutional:      Appearance: Normal appearance. She is normal weight.  Neck:     Vascular: No carotid bruit.  Cardiovascular:     Rate and Rhythm: Normal rate and regular rhythm.     Heart sounds: Normal heart sounds.  Pulmonary:     Effort: Pulmonary effort is normal. No respiratory distress.     Breath sounds: Normal breath sounds.  Abdominal:     General: Abdomen is flat. Bowel sounds are normal.     Palpations: Abdomen is soft.     Tenderness: There is no abdominal tenderness.  Skin:    General: Skin is warm and dry.  Neurological:     Mental Status: She is alert and oriented to person, place, and time.  Psychiatric:        Mood and Affect: Mood normal.        Behavior: Behavior normal.     Diabetic Foot Exam - Simple   Simple Foot Form  09/04/2022  8:27 PM  Visual Inspection No deformities, no ulcerations, no other skin breakdown bilaterally: Yes Sensation Testing Intact to touch and monofilament testing bilaterally: Yes Pulse Check Posterior Tibialis and Dorsalis pulse  intact bilaterally: Yes Comments      Lab Results  Component Value Date   WBC 8.5 09/01/2022   HGB 10.8 (L) 09/01/2022   HCT 31.5 (L) 09/01/2022   PLT 187 09/01/2022   GLUCOSE 150 (H) 09/01/2022   CHOL 134 09/01/2022   TRIG 63 09/01/2022   HDL 60 09/01/2022   LDLCALC 61 09/01/2022   ALT 16 09/01/2022   AST 24 09/01/2022   NA 142 09/01/2022   K 4.3 09/01/2022   CL 96 09/01/2022   CREATININE 2.50 (H) 09/01/2022   BUN 84 (HH) 09/01/2022   CO2 28 09/01/2022   TSH 2.020 09/10/2020   INR 1.2 (H) 02/24/2022   HGBA1C 8.3 (H) 09/01/2022      Assessment & Plan:    Primary biliary cholangitis (HCC) Assessment & Plan: Continue ursodiol. Management per specialist.  Iron deficiency anemia, unspecified iron deficiency anemia type  Acquired thrombophilia (HCC) Assessment & Plan: Currently on eliquis due to atrial fibrillation. Low dose due to age and CKD.   Gastroesophageal reflux disease without esophagitis Assessment & Plan: Continue pantoprazole 40 mg once daily.   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  No changes to medicines. Lopressor 100 mg twice daily, spironolactone 25 mg daily, torsemide 40 mg twice daily.  Continue to work on eating a healthy diet and exercise.  Labs drawn today.     Mixed hyperlipidemia Assessment & Plan: Well controlled.  No changes to medicines. Continue atorvastatin 40 mg before bed.  Continue to work on eating a healthy diet and exercise.  Labs reviewed.    CKD stage 4 due to type 2 diabetes mellitus (HCC) Assessment & Plan: Control: poor Recommend check sugars fasting daily. Recommend check feet daily. Recommend annual eye exams. Medicines:  Increase insulin to 50 U in am and continue 24 U in pm.  Continue Levemir 46 units in the morning and 24 units in the  evening. Continue to work on eating a healthy diet and exercise.  Labs drawn today.      Long term (current) use of insulin (HCC)     No orders of the defined types were placed in this encounter.   No orders of the defined types were placed in this encounter.    Follow-up: Return in about 3 months (around 12/04/2022) for chronic follow up, lab visit.   I,Mikeyla M Morrison,acting as a Neurosurgeon for Blane Ohara, MD.,have documented all relevant documentation on the behalf of Blane Ohara, MD,as directed by  Blane Ohara, MD while in the presence of Blane Ohara, MD.   An After Visit Summary was printed and given to the patient.  I attest that I have reviewed this visit and agree with the plan scribed by my staff.   Blane Ohara, MD [facility] [phone]

## 2022-09-07 ENCOUNTER — Other Ambulatory Visit: Payer: Self-pay | Admitting: Family Medicine

## 2022-09-07 DIAGNOSIS — E1159 Type 2 diabetes mellitus with other circulatory complications: Secondary | ICD-10-CM

## 2022-09-09 NOTE — Telephone Encounter (Signed)
Called pt to let her know Dr. Servando Salina wants her to be see by an app. No answer, left message for her to return call.

## 2022-09-10 NOTE — Telephone Encounter (Signed)
Called patient, she states that she does not have the increase swelling now, it has improved. She will continue to monitor and will let us know of any issues. If the swelling comes back, she will call to schedule an appointment with an app.

## 2022-09-10 NOTE — Telephone Encounter (Signed)
Patient returned RN's call and reported that the swelling has been better the last couple of days and she would like to monitor for a week or so and if the swelling gets worse she will call and make an appointment.  Patient wants a call back to confirm this plan.

## 2022-09-12 ENCOUNTER — Other Ambulatory Visit (HOSPITAL_COMMUNITY): Payer: Medicare Other

## 2022-09-18 ENCOUNTER — Ambulatory Visit (HOSPITAL_COMMUNITY)
Admission: RE | Admit: 2022-09-18 | Discharge: 2022-09-18 | Disposition: A | Discharge status: Home / Self Care | Payer: Medicare Other | Source: Ambulatory Visit | Attending: Gastroenterology | Admitting: Gastroenterology

## 2022-09-18 DIAGNOSIS — K745 Biliary cirrhosis, unspecified: Secondary | ICD-10-CM | POA: Diagnosis not present

## 2022-09-18 DIAGNOSIS — K7460 Unspecified cirrhosis of liver: Secondary | ICD-10-CM | POA: Diagnosis not present

## 2022-09-18 DIAGNOSIS — K8020 Calculus of gallbladder without cholecystitis without obstruction: Secondary | ICD-10-CM | POA: Diagnosis not present

## 2022-09-20 ENCOUNTER — Encounter: Payer: Self-pay | Admitting: Family Medicine

## 2022-09-20 NOTE — Assessment & Plan Note (Signed)
Continue ursodiol.  °Management per specialist.  ° °

## 2022-09-20 NOTE — Assessment & Plan Note (Signed)
Well controlled.  No changes to medicines. Lopressor 100 mg twice daily, spironolactone 25 mg daily, torsemide 40 mg twice daily.  Continue to work on eating a healthy diet and exercise.  Labs drawn today.   

## 2022-09-20 NOTE — Assessment & Plan Note (Signed)
Continue pantoprazole 40 mg once daily

## 2022-09-20 NOTE — Assessment & Plan Note (Signed)
Currently on eliquis due to atrial fibrillation. Low dose due to age and CKD. 

## 2022-09-20 NOTE — Assessment & Plan Note (Signed)
Control: poor Recommend check sugars fasting daily. Recommend check feet daily. Recommend annual eye exams. Medicines:  Increase insulin to 50 U in am and continue 24 U in pm.  Continue Levemir 46 units in the morning and 24 units in the evening. Continue to work on eating a healthy diet and exercise.  Labs drawn today.

## 2022-09-20 NOTE — Assessment & Plan Note (Signed)
Well controlled.  No changes to medicines. Continue atorvastatin 40 mg before bed.  Continue to work on eating a healthy diet and exercise.  Labs reviewed.

## 2022-09-24 ENCOUNTER — Telehealth: Payer: Self-pay

## 2022-09-24 NOTE — Telephone Encounter (Signed)
Patient is due for her next Prolia injection October 22, 2022.  Should we hold this dose due to her abnormal kidney labs?  BUN 84, Creat 2.50, eGFR 18   Component     Latest Ref Rng 05/02/2022 05/19/2022 09/01/2022  Glucose     70 - 99 mg/dL 540 (H)  72  981 (H)   BUN     8 - 27 mg/dL 67 (H)  58 (H)  84 (HH)   Creatinine     0.57 - 1.00 mg/dL 1.91 (H)  4.78 (H)  2.95 (H)   eGFR     >59 mL/min/1.73 23 (L)  22 (L)  18 (L)   BUN/Creatinine Ratio     12 - 28  33 (H)  27  34 (H)   Sodium     134 - 144 mmol/L 139  140  142   Potassium     3.5 - 5.2 mmol/L 4.6  4.3  4.3   Chloride     96 - 106 mmol/L 104  99  96   CO2     20 - 29 mmol/L 22  23  28    Calcium     8.7 - 10.3 mg/dL 8.0 (L)  9.1  62.1   Total Protein     6.0 - 8.5 g/dL 7.0  7.1  7.3   Albumin     3.7 - 4.7 g/dL 3.7  3.7  3.8   Globulin, Total     1.5 - 4.5 g/dL 3.3  3.4  3.5   Albumin/Globulin Ratio     1.2 - 2.2  1.1 (L)  1.1 (L)  1.1 (L)   Total Bilirubin     0.0 - 1.2 mg/dL 0.5  0.4  0.4   Alkaline Phosphatase     44 - 121 IU/L 186 (H)  165 (H)  147 (H)   AST     0 - 40 IU/L 24  23  24    ALT     0 - 32 IU/L 15  14  16      Legend: (H) High (L) Low (HH) High Panic

## 2022-09-26 ENCOUNTER — Other Ambulatory Visit: Payer: Medicare Other

## 2022-09-26 DIAGNOSIS — N184 Chronic kidney disease, stage 4 (severe): Secondary | ICD-10-CM | POA: Diagnosis not present

## 2022-09-27 LAB — CBC WITH DIFFERENTIAL/PLATELET
Basophils Absolute: 0 10*3/uL (ref 0.0–0.2)
Basos: 0 %
EOS (ABSOLUTE): 0 10*3/uL (ref 0.0–0.4)
Eos: 0 %
Hematocrit: 32.5 % — ABNORMAL LOW (ref 34.0–46.6)
Hemoglobin: 10.9 g/dL — ABNORMAL LOW (ref 11.1–15.9)
Immature Grans (Abs): 0 10*3/uL (ref 0.0–0.1)
Immature Granulocytes: 0 %
Lymphocytes Absolute: 1.5 10*3/uL (ref 0.7–3.1)
Lymphs: 16 %
MCH: 31.8 pg (ref 26.6–33.0)
MCHC: 33.5 g/dL (ref 31.5–35.7)
MCV: 95 fL (ref 79–97)
Monocytes Absolute: 0.8 10*3/uL (ref 0.1–0.9)
Monocytes: 9 %
Neutrophils Absolute: 6.5 10*3/uL (ref 1.4–7.0)
Neutrophils: 75 %
Platelets: 202 10*3/uL (ref 150–450)
RBC: 3.43 x10E6/uL — ABNORMAL LOW (ref 3.77–5.28)
RDW: 14.6 % (ref 11.7–15.4)
WBC: 8.9 10*3/uL (ref 3.4–10.8)

## 2022-09-27 LAB — RENAL FUNCTION PANEL
Albumin: 3.8 g/dL (ref 3.7–4.7)
BUN/Creatinine Ratio: 34 — ABNORMAL HIGH (ref 12–28)
BUN: 90 mg/dL (ref 8–27)
CO2: 25 mmol/L (ref 20–29)
Calcium: 9.7 mg/dL (ref 8.7–10.3)
Chloride: 96 mmol/L (ref 96–106)
Creatinine, Ser: 2.64 mg/dL — ABNORMAL HIGH (ref 0.57–1.00)
Glucose: 196 mg/dL — ABNORMAL HIGH (ref 70–99)
Phosphorus: 4.5 mg/dL — ABNORMAL HIGH (ref 3.0–4.3)
Potassium: 4.7 mmol/L (ref 3.5–5.2)
Sodium: 139 mmol/L (ref 134–144)
eGFR: 17 mL/min/{1.73_m2} — ABNORMAL LOW (ref >59)

## 2022-09-27 LAB — IRON,TIBC AND FERRITIN PANEL
Ferritin: 87 ng/mL (ref 15–150)
Iron Saturation: 14 % — ABNORMAL LOW (ref 15–55)
Iron: 51 ug/dL (ref 27–139)
Total Iron Binding Capacity: 369 ug/dL (ref 250–450)
UIBC: 318 ug/dL (ref 118–369)

## 2022-09-27 LAB — MICROALBUMIN / CREATININE URINE RATIO
Creatinine, Urine: 28.5 mg/dL
Microalb/Creat Ratio: 34 mg/g creat — ABNORMAL HIGH (ref 0–29)
Microalbumin, Urine: 9.6 ug/mL

## 2022-09-27 LAB — PARATHYROID HORMONE, INTACT (NO CA): PTH: 92 pg/mL — ABNORMAL HIGH (ref 15–65)

## 2022-09-29 ENCOUNTER — Telehealth: Payer: Self-pay

## 2022-09-29 NOTE — Telephone Encounter (Signed)
Faxed results to [facility] and I called the office to let them know it was an alert on the results and that I faxed the results also. Nurse said it will look for them.

## 2022-09-30 DIAGNOSIS — E1122 Type 2 diabetes mellitus with diabetic chronic kidney disease: Secondary | ICD-10-CM | POA: Diagnosis not present

## 2022-09-30 DIAGNOSIS — M109 Gout, unspecified: Secondary | ICD-10-CM | POA: Diagnosis not present

## 2022-09-30 DIAGNOSIS — E785 Hyperlipidemia, unspecified: Secondary | ICD-10-CM | POA: Diagnosis not present

## 2022-09-30 DIAGNOSIS — I129 Hypertensive chronic kidney disease with stage 1 through stage 4 chronic kidney disease, or unspecified chronic kidney disease: Secondary | ICD-10-CM | POA: Diagnosis not present

## 2022-09-30 DIAGNOSIS — I5030 Unspecified diastolic (congestive) heart failure: Secondary | ICD-10-CM | POA: Diagnosis not present

## 2022-09-30 DIAGNOSIS — I480 Paroxysmal atrial fibrillation: Secondary | ICD-10-CM | POA: Diagnosis not present

## 2022-09-30 DIAGNOSIS — N2581 Secondary hyperparathyroidism of renal origin: Secondary | ICD-10-CM | POA: Diagnosis not present

## 2022-09-30 DIAGNOSIS — N184 Chronic kidney disease, stage 4 (severe): Secondary | ICD-10-CM | POA: Diagnosis not present

## 2022-09-30 DIAGNOSIS — D509 Iron deficiency anemia, unspecified: Secondary | ICD-10-CM | POA: Diagnosis not present

## 2022-10-03 ENCOUNTER — Encounter: Payer: Self-pay | Admitting: Family Medicine

## 2022-10-24 ENCOUNTER — Other Ambulatory Visit: Payer: Self-pay | Admitting: Gastroenterology

## 2022-10-24 ENCOUNTER — Other Ambulatory Visit: Payer: Self-pay | Admitting: Family Medicine

## 2022-11-09 ENCOUNTER — Other Ambulatory Visit: Payer: Self-pay | Admitting: Family Medicine

## 2022-12-10 ENCOUNTER — Encounter: Payer: Self-pay | Admitting: Cardiology

## 2022-12-21 ENCOUNTER — Other Ambulatory Visit: Payer: Self-pay

## 2022-12-21 DIAGNOSIS — E782 Mixed hyperlipidemia: Secondary | ICD-10-CM

## 2022-12-21 DIAGNOSIS — D509 Iron deficiency anemia, unspecified: Secondary | ICD-10-CM

## 2022-12-21 DIAGNOSIS — E119 Type 2 diabetes mellitus without complications: Secondary | ICD-10-CM

## 2022-12-21 DIAGNOSIS — I131 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, with stage 1 through stage 4 chronic kidney disease, or unspecified chronic kidney disease: Secondary | ICD-10-CM

## 2022-12-22 ENCOUNTER — Other Ambulatory Visit: Payer: Medicare Other

## 2022-12-22 DIAGNOSIS — I131 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, with stage 1 through stage 4 chronic kidney disease, or unspecified chronic kidney disease: Secondary | ICD-10-CM | POA: Diagnosis not present

## 2022-12-22 DIAGNOSIS — D509 Iron deficiency anemia, unspecified: Secondary | ICD-10-CM | POA: Diagnosis not present

## 2022-12-22 DIAGNOSIS — E782 Mixed hyperlipidemia: Secondary | ICD-10-CM

## 2022-12-22 DIAGNOSIS — E119 Type 2 diabetes mellitus without complications: Secondary | ICD-10-CM

## 2022-12-25 ENCOUNTER — Encounter: Payer: Self-pay | Admitting: Family Medicine

## 2022-12-25 ENCOUNTER — Ambulatory Visit (INDEPENDENT_AMBULATORY_CARE_PROVIDER_SITE_OTHER): Payer: Medicare Other | Admitting: Family Medicine

## 2022-12-25 VITALS — BP 106/60 | HR 68 | Temp 96.6°F | Resp 16 | Ht 62.0 in | Wt 160.8 lb

## 2022-12-25 DIAGNOSIS — E1122 Type 2 diabetes mellitus with diabetic chronic kidney disease: Secondary | ICD-10-CM | POA: Diagnosis not present

## 2022-12-25 DIAGNOSIS — I4891 Unspecified atrial fibrillation: Secondary | ICD-10-CM

## 2022-12-25 DIAGNOSIS — J301 Allergic rhinitis due to pollen: Secondary | ICD-10-CM | POA: Diagnosis not present

## 2022-12-25 DIAGNOSIS — J0100 Acute maxillary sinusitis, unspecified: Secondary | ICD-10-CM

## 2022-12-25 DIAGNOSIS — M810 Age-related osteoporosis without current pathological fracture: Secondary | ICD-10-CM

## 2022-12-25 DIAGNOSIS — D6869 Other thrombophilia: Secondary | ICD-10-CM

## 2022-12-25 DIAGNOSIS — K219 Gastro-esophageal reflux disease without esophagitis: Secondary | ICD-10-CM | POA: Diagnosis not present

## 2022-12-25 DIAGNOSIS — N184 Chronic kidney disease, stage 4 (severe): Secondary | ICD-10-CM

## 2022-12-25 DIAGNOSIS — I131 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, with stage 1 through stage 4 chronic kidney disease, or unspecified chronic kidney disease: Secondary | ICD-10-CM

## 2022-12-25 DIAGNOSIS — E782 Mixed hyperlipidemia: Secondary | ICD-10-CM

## 2022-12-25 DIAGNOSIS — K743 Primary biliary cirrhosis: Secondary | ICD-10-CM

## 2022-12-25 DIAGNOSIS — M109 Gout, unspecified: Secondary | ICD-10-CM | POA: Diagnosis not present

## 2022-12-25 MED ORDER — AMOXICILLIN 250 MG PO CAPS: 250.0000 mg | ORAL_CAPSULE | Freq: Two times a day (BID) | ORAL | 0 refills | Status: AC

## 2022-12-25 MED ORDER — FEBUXOSTAT 40 MG PO TABS: 40.0000 mg | ORAL_TABLET | Freq: Every day | ORAL | 0 refills | Status: DC

## 2022-12-25 MED ORDER — MONTELUKAST SODIUM 10 MG PO TABS: 10.0000 mg | ORAL_TABLET | Freq: Every day | ORAL | 3 refills | Status: DC

## 2022-12-25 NOTE — Progress Notes (Signed)
Subjective:  Patient ID: Sara Hancock, female    DOB: 1935/04/02  Age: 88 y.o. MRN: [id number]  Chief Complaint  Patient presents with   Medical Management of Chronic Issues   HPI Diabetes:  Complications: Hyperlipidemia Glucose checking: twice a day Glucose logs: 70-93  morning and 120's in the evening Hypoglycemia: early morning (eats a early morning snack before breakfast, when she gets up to go to the bathroom).   Most recent A1C: 9.3 Current medications: Levemir 50 units in the morning and 28 units in the evenning. Last Eye Exam: Feb. 2024.   Foot checks: daily  Hyperlipidemia: Current medications: Atorvastatin 40 mg daily  Hypertension: Complications: Diabetes. Current medications: Metoprolol 100 mg twice a day, Spironolactone 25 mg daily and Torsemide 40 mg twice daily.    Afib: Taking Eliquis 2.5 mg twice a day    Primary biliary cholangitis with cirrhosis: sees Dr. Russella Dar.     12/25/2022   11:09 AM 05/22/2022   10:57 AM 01/08/2022    9:49 AM 05/14/2021    2:07 PM 03/28/2021   10:48 AM  Depression screen PHQ 2/9  Decreased Interest 0 0 0 0 0  Down, Depressed, Hopeless 0 0 0 0 0  PHQ - 2 Score 0 0 0 0 0        12/25/2022   11:09 AM  Fall Risk   Falls in the past year? 0  Number falls in past yr: 0  Injury with Fall? 0  Risk for fall due to : Impaired mobility  Follow up Falls evaluation completed;Falls prevention discussed    Patient Care Team: Blane Ohara, MD as PCP - General (Internal Medicine) Thomasene Ripple, DO as PCP - Cardiology (Cardiology) Mardella Layman, MD as Consulting Physician (Gastroenterology) Duke Salvia, MD as Consulting Physician (Cardiology) Meryl Dare, MD as Consulting Physician (Gastroenterology) Zettie Pho, [facility] (Inactive) as Pharmacist (Pharmacist) Darnell Level, MD as Consulting Physician (Internal Medicine) Zettie Pho, [facility] (Inactive) (Pharmacist)   Review of Systems  Constitutional:  Negative  for chills, fatigue and fever.  HENT:  Negative for congestion, rhinorrhea and sore throat.   Respiratory:  Negative for cough and shortness of breath.   Cardiovascular:  Negative for chest pain.  Gastrointestinal:  Negative for abdominal pain, constipation, diarrhea, nausea and vomiting.  Genitourinary:  Negative for dysuria and urgency.  Musculoskeletal:  Positive for back pain. Negative for myalgias.  Neurological:  Negative for dizziness, weakness, light-headedness and headaches.  Psychiatric/Behavioral:  Negative for dysphoric mood. The patient is not nervous/anxious.     Current Outpatient Medications on File Prior to Visit  Medication Sig Dispense Refill   Accu-Chek FastClix Lancets MISC CHECKING SUGARS TWICE DAILY. 102 each 3   amLODipine (NORVASC) 2.5 MG tablet TAKE 1 TABLET(2.5 MG) BY MOUTH DAILY 90 tablet 1   atorvastatin (LIPITOR) 40 MG tablet TAKE 1 TABLET(40 MG) BY MOUTH DAILY 90 tablet 0   calcium-vitamin D (OSCAL WITH D) 250-125 MG-UNIT per tablet Take 1 tablet by mouth 2 (two) times daily.     Cholecalciferol (VITAMIN D3) 1000 units CAPS Take 1,000 Units by mouth daily.     ELIQUIS 2.5 MG TABS tablet TAKE 1 TABLET(2.5 MG) BY MOUTH TWICE DAILY 180 tablet 1   fexofenadine (ALLEGRA ALLERGY) 60 MG tablet Take 1 tablet (60 mg total) by mouth 2 (two) times daily. (Patient taking differently: Take 60 mg by mouth daily.) 180 tablet 1   fluticasone (FLONASE) 50 MCG/ACT nasal spray Place 1  spray into both nostrils as needed.     folic acid (FOLVITE) 400 MCG tablet Take 400 mcg by mouth daily.     glucose blood (ACCU-CHEK GUIDE) test strip USE AS INSTRUCTED TWICE DAILY 200 strip 3   insulin detemir (LEVEMIR FLEXPEN) 100 UNIT/ML FlexPen INJECT 48 UNITS UNDER THE SKIN EVERY MORNING AND 26 UNITS WITH DINNER (Patient taking differently: INJECT 50 UNITS UNDER THE SKIN EVERY MORNING AND 28 UNITS WITH DINNER) 54 mL 0   Insulin Pen Needle (BD PEN NEEDLE NANO 2ND GEN) 32G X 4 MM MISC USE A  NEEDLE EACH TIME FOR INSULIN INJECTION 200 each 1   metolazone (ZAROXOLYN) 5 MG tablet TAKE 1 TABLET(5 MG) BY MOUTH 1 TIME A WEEK 4 tablet 8   metoprolol tartrate (LOPRESSOR) 100 MG tablet TAKE 1 TABLET(100 MG) BY MOUTH TWICE DAILY 180 tablet 0   Multiple Vitamin (MULTIVITAMIN WITH MINERALS) TABS Take 1 tablet by mouth daily.     nystatin cream (MYCOSTATIN) APPLY SUFFICIENT AMOUNT TOPICALLY TO THE AFFECTED AREA TWICE DAILY 30 g 1   pantoprazole (PROTONIX) 40 MG tablet TAKE 1 TABLET BY MOUTH EVERY DAY 90 tablet 1   potassium chloride SA (KLOR-CON M) 20 MEQ tablet Take 1 tablet (20 mEq total) by mouth daily. 90 tablet 0   spironolactone (ALDACTONE) 25 MG tablet Take 25 mg by mouth daily.     torsemide (DEMADEX) 20 MG tablet TAKE 2 TABLETS BY MOUTH TWICE DAILY (Patient taking differently: 20 mg daily.) 360 tablet 0   ursodiol (ACTIGALL) 300 MG capsule TAKE 1 CAPSULE(300 MG) BY MOUTH TWICE DAILY 180 capsule 0   vitamin C (ASCORBIC ACID) 500 MG tablet Take 500 mg by mouth daily.     vitamin E 400 UNIT capsule Take 400 Units by mouth daily.     No current facility-administered medications on file prior to visit.   Past Medical History:  Diagnosis Date   Acquired thrombophilia (HCC) 09/11/2019   Allergic rhinitis 09/17/2015   Anemia    ANEMIA, NORMOCYTIC 10/27/2007   Qualifier: Diagnosis of  By: Koleen Distance CMA (AAMA), Leisha     Anticoagulant long-term use 01/08/2011   Arthritis    Benign neoplasm of colon 01/08/2011   Biliary cirrhosis (HCC)    BRADYCARDIA 11/15/2008   Qualifier: Diagnosis of  By: Wyn Quaker CMA, Amanda     Cellulitis of left foot 09/25/2016   Cholelithiasis 01/09/12   Chronic kidney disease, stage 3 (HCC)    Combined form of age-related cataract, both eyes 02/26/2017   Diabetes mellitus type 2 without retinopathy (HCC) 10/27/2013   Diverticulosis of colon with hemorrhage 06/08/2000   Dyslipidemia 09/17/2015   Encounter for therapeutic drug monitoring 06/09/2013   Esophageal reflux 01/08/2011    Esophageal stenosis 01/08/11   Esophagitis 01/08/11   GALLSTONES 10/26/2008   Qualifier: Diagnosis of  By: Jarold Motto MD Lang Snow    Gastritis, erosive chronic 01/08/2011   GERD (gastroesophageal reflux disease)    Glaucoma suspect of both eyes 02/26/2017   Glaucoma suspect of both eyes 02/26/2017   Hiatal hernia 01/08/11   History of IBS 10/27/2007   Qualifier: Diagnosis of  By: Koleen Distance CMA (AAMA), Leisha     HTN (hypertension)    Hx of adenomatous colonic polyps 01/08/11   Hyperplastic colon polyp 06/08/2000, 07/31/2003, 01/08/11   Hypertension, essential 10/27/2007   Qualifier: Diagnosis of  By: Koleen Distance CMA (AAMA), Hulan Saas     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, with stage 1 through stage  4 chronic kidney disease, or unspecified chronic kidney disease    Iron deficiency anemia 01/08/2011   Left foot pain 09/25/2016   Localized edema    Long term (current) use of insulin (HCC)    Mixed hyperlipidemia    Nuclear cataract 07/03/2011   Obesity    Obesity (BMI 30-39.9) 03/22/2012   Osteoarthritis of right thumb 09/17/2015   Osteopenia    Other iron deficiency anemias    Other specified menopausal and perimenopausal disorders    PAF (paroxysmal atrial fibrillation) (HCC)    occurring in the setting of bradycardia and mild first degree AV block followed by Dr. Clide Cliff   Pain in thoracic spine    Paraesophageal hiatal hernia, 3cm by EGD 03/22/2012   Pedal edema 12/13/2019   Persistent atrial fibrillation (HCC) 08/28/2019   Posterior vitreous detachment, bilateral 01/10/2016   Primary biliary cholangitis (HCC) 10/27/2007   Qualifier: Diagnosis of  By: Koleen Distance CMA (AAMA), Hulan Saas     Pure hypercholesterolemia    Retinal tear, right 07/03/2011   Shortness of breath 12/13/2019   Stage 3b chronic kidney disease (HCC) 09/11/2019   Type II or unspecified type diabetes mellitus without mention of complication, not stated as uncontrolled    Uterine fibroid 01/09/12   Past  Surgical History:  Procedure Laterality Date   BREAST CYST EXCISION     left   CARDIOVERSION N/A 11/27/2017   Procedure: CARDIOVERSION;  Surgeon: Thurmon Fair, MD;  Location: [facility];  Service: Cardiovascular;  Laterality: N/A;   COLONOSCOPY     INSERTION OF MESH  05/18/2012   Procedure: INSERTION OF MESH;  Surgeon: Ardeth Sportsman, MD;  Location: [facility];  Service: General;  Laterality: N/A;   LIVER BIOPSY  05/18/2012   Procedure: LIVER BIOPSY;  Surgeon: Ardeth Sportsman, MD;  Location: [facility];  Service: General;;   POLYPECTOMY     THYROIDECTOMY, PARTIAL  1980   TUBAL LIGATION  1972   VENTRAL HERNIA REPAIR  05/18/2012   Procedure: LAPAROSCOPIC VENTRAL HERNIA;  Surgeon: Ardeth Sportsman, MD;  Location: [facility];  Service: General;  Laterality: N/A;  Laparoscopic Ventral Wall Hernia with Mesh    Family History  Problem Relation Age of Onset   Stroke Mother 26   Heart attack Father    Diabetes Sister        x 2   Aneurysm Sister    Stroke Sister    Congestive Heart Failure Sister    Diabetes Brother    Kidney failure Other    Hyperlipidemia Other    Arrhythmia Other    Congestive Heart Failure Other    Hypertension Other    Arthritis Other    Colon cancer Neg Hx    Esophageal cancer Neg Hx    Stomach cancer Neg Hx    Rectal cancer Neg Hx    Social History   Socioeconomic History   Marital status: Married    Spouse name: Not on file   Number of children: 2   Years of education: Not on file   Highest education level: Not on file  Occupational History   Occupation: retired    Associate Professor: RETIRED  Tobacco Use   Smoking status: Never   Smokeless tobacco: Never  Vaping Use   Vaping status: Never Used  Substance and Sexual Activity   Alcohol use: No   Drug use: No   Sexual activity: Not Currently  Other Topics Concern   Not on file  Social History Narrative  Not on file   Social Determinants of Health   Financial Resource Strain: Low Risk  (08/12/2021)   Overall  Financial Resource Strain (CARDIA)    Difficulty of Paying Living Expenses: Not hard at all  Food Insecurity: No Food Insecurity (01/08/2022)   Hunger Vital Sign    Worried About Running Out of Food in the Last Year: Never true    Ran Out of Food in the Last Year: Never true  Transportation Needs: No Transportation Needs (01/08/2022)   PRAPARE - Administrator, Civil Service (Medical): No    Lack of Transportation (Non-Medical): No  Physical Activity: Not on file  Stress: Not on file  Social Connections: Not on file    Objective:  BP 106/60   Pulse 68   Temp (!) 96.6 F (35.9 C)   Resp 16   Ht 5\' 2"  (1.575 m)   Wt 160 lb 12.8 oz (72.9 kg)   BMI 29.41 kg/m      12/25/2022   11:04 AM 09/04/2022   10:38 AM 06/10/2022    4:08 PM  BP/Weight  Systolic BP 106 110 122  Diastolic BP 60 72 78  Wt. (Lbs) 160.8 161.4 154  BMI 29.41 kg/m2 29.05 kg/m2 27.72 kg/m2    Physical Exam Vitals reviewed.  Constitutional:      Appearance: Normal appearance. She is normal weight.  HENT:     Right Ear: Tympanic membrane and ear canal normal.     Left Ear: Tympanic membrane and ear canal normal.     Nose:     Comments: Pale and swollen     Mouth/Throat:     Pharynx: No oropharyngeal exudate or posterior oropharyngeal erythema.  Neck:     Vascular: No carotid bruit.  Cardiovascular:     Rate and Rhythm: Normal rate and regular rhythm.     Heart sounds: Normal heart sounds.  Pulmonary:     Effort: Pulmonary effort is normal. No respiratory distress.     Breath sounds: Normal breath sounds.  Abdominal:     General: Abdomen is flat. Bowel sounds are normal.     Palpations: Abdomen is soft.     Tenderness: There is no abdominal tenderness.  Neurological:     Mental Status: She is alert and oriented to person, place, and time.  Psychiatric:        Mood and Affect: Mood normal.        Behavior: Behavior normal.     Diabetic Foot Exam - Simple   Simple Foot Form Diabetic  Foot exam was performed with the following findings: Yes 12/25/2022 11:10 AM  Visual Inspection No deformities, no ulcerations, no other skin breakdown bilaterally: Yes Sensation Testing Intact to touch and monofilament testing bilaterally: Yes Pulse Check Posterior Tibialis and Dorsalis pulse intact bilaterally: Yes Comments      Lab Results  Component Value Date   WBC 8.7 12/22/2022   HGB 11.6 12/22/2022   HCT 34.9 12/22/2022   PLT 195 12/22/2022   GLUCOSE 79 12/22/2022   CHOL 130 12/22/2022   TRIG 83 12/22/2022   HDL 56 12/22/2022   LDLCALC 58 12/22/2022   ALT 12 12/22/2022   AST 22 12/22/2022   NA 142 12/22/2022   K 4.1 12/22/2022   CL 95 (L) 12/22/2022   CREATININE 2.51 (H) 12/22/2022   BUN 93 (HH) 12/22/2022   CO2 26 12/22/2022   TSH 2.020 09/10/2020   INR 1.2 (H) 02/24/2022   HGBA1C 8.5 (H)  12/22/2022      Assessment & Plan:    Acquired thrombophilia (HCC) Assessment & Plan: Currently on eliquis due to atrial fibrillation. Low dose due to age and CKD.   Gastroesophageal reflux disease without esophagitis Assessment & Plan: Continue pantoprazole 40 mg once daily.   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  No changes to medicines. Lopressor 100 mg twice daily, spironolactone 25 mg daily, torsemide 40 mg twice daily.  Continue to work on eating a healthy diet and exercise.  Labs reviewed today.     Mixed hyperlipidemia Assessment & Plan: Well controlled.  No changes to medicines. Continue atorvastatin 40 mg before bed.  Continue to work on eating a healthy diet and exercise.  Labs reviewed.    CKD stage 4 due to type 2 diabetes mellitus (HCC) Assessment & Plan: Control: not at goal Recommend check sugars fasting daily. Recommend check feet daily. Recommend annual eye exams. Medicines:  Increase insulin to 32 units in the evening.  Continue 50 units in the morning. Continue to work on eating a healthy diet and exercise.  Labs reviewed.       Acute non-recurrent maxillary sinusitis Assessment & Plan: Sent Amoxicillin 250 mg twice daily x 10 days.   Orders: -     Amoxicillin; Take 1 capsule (250 mg total) by mouth 2 (two) times daily for 10 days.  Dispense: 20 capsule; Refill: 0  Non-seasonal allergic rhinitis due to pollen Assessment & Plan: Add Singulair 10 mg daily.  May continue Allegra or change to Zyrtec or Claritin.    Orders: -     Montelukast Sodium; Take 1 tablet (10 mg total) by mouth at bedtime.  Dispense: 30 tablet; Refill: 3  Acute gout of left foot, unspecified cause Assessment & Plan: Change allopurinol to Uloric.  Uloric 40 mg daily.   Osteoporosis, post-menopausal Assessment & Plan: Recommend Evenity for osteoporosis.   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  Dispense: 90 tablet; Refill: 0     Meds ordered this encounter  Medications   montelukast (SINGULAIR) 10 MG tablet    Sig: Take 1 tablet (10 mg total) by mouth at bedtime.    Dispense:  30 tablet    Refill:  3   amoxicillin (AMOXIL) 250 MG capsule    Sig: Take 1 capsule (250 mg total) by mouth 2 (two) times daily for 10 days.    Dispense:  20 capsule    Refill:  0   febuxostat (ULORIC) 40 MG tablet    Sig: Take 1 tablet (40 mg total) by mouth daily.    Dispense:  90 tablet    Refill:  0    No orders of the defined types were placed in this encounter.    Follow-up: Return in about 3 months (around 03/27/2023) for needs AWV, chronic follow up.   I,Marla I Leal-Borjas,acting as a scribe for Blane Ohara, MD.,have documented all relevant documentation on the behalf of Blane Ohara, MD,as directed by  Blane Ohara, MD while in the  presence of Blane Ohara, MD.   An After Visit Summary was printed and given to the patient.  Blane Ohara, MD [facility] [phone]

## 2022-12-25 NOTE — Patient Instructions (Signed)
For sinus infection: Amoxicillin 250 mg twice daily x 10 days.  For allergies: Add Singulair 10 mg daily.  May continue Allegra or change to Zyrtec or Claritin.  You may be able to take just the Singulair alone and that will be adequate to control your allergies.  Increase insulin to 32 units in the evening.  Continue 50 units in the morning.  Change allopurinol to Uloric.  Uloric 40 mg daily.  Recommend Evenity for osteoporosis.

## 2022-12-26 ENCOUNTER — Telehealth: Payer: Self-pay

## 2022-12-26 DIAGNOSIS — N184 Chronic kidney disease, stage 4 (severe): Secondary | ICD-10-CM | POA: Diagnosis not present

## 2022-12-26 NOTE — Telephone Encounter (Signed)
   Patient: Sara Hancock  DOB: [birth year]  MRN: [id number]  Evenity benefits verified by [employer] on: 12/26/22 Coverage Available: Buy & Bill Prior authorization NOT REQUIRED Deductible: $240 of $240 met  Benefit Details: Benefits subject to a $240.00 deductible ($240.00 met) and 20.0%% co-insurance for the administration and cost of Evenity.  For the Secondary MD Purchase access option, this is a Medicare Supplement Plan J and it covers the Medicare Part B deductible, co-insurance and 100% of the excess charges.  Patient agreed to proceed with ordering and will be scheduled once it has arrived in the office.  This is a NEW START.  Jacklynn Bue, LPN

## 2022-12-28 DIAGNOSIS — J301 Allergic rhinitis due to pollen: Secondary | ICD-10-CM | POA: Insufficient documentation

## 2022-12-28 NOTE — Assessment & Plan Note (Signed)
Recommend Evenity for osteoporosis.

## 2022-12-28 NOTE — Assessment & Plan Note (Addendum)
Control: not at goal Recommend check sugars fasting daily. Recommend check feet daily. Recommend annual eye exams. Medicines: Increase insulin to 32 units in the evening.  Continue 50 units in the morning. Continue to work on eating a healthy diet and exercise.  Labs reviewed.

## 2022-12-28 NOTE — Assessment & Plan Note (Signed)
Sent Amoxicillin 250 mg twice daily x 10 days.

## 2022-12-28 NOTE — Assessment & Plan Note (Addendum)
Add Singulair 10 mg daily.  May continue Allegra or change to Zyrtec or Claritin.

## 2022-12-28 NOTE — Assessment & Plan Note (Signed)
Continue pantoprazole 40 mg once daily

## 2022-12-28 NOTE — Assessment & Plan Note (Signed)
Change allopurinol to Uloric.  Uloric 40 mg daily.

## 2022-12-28 NOTE — Assessment & Plan Note (Signed)
Currently on eliquis due to atrial fibrillation. Low dose due to age and CKD.

## 2022-12-28 NOTE — Assessment & Plan Note (Signed)
Well controlled.  No changes to medicines. Continue atorvastatin 40 mg before bed.  Continue to work on eating a healthy diet and exercise.  Labs reviewed.

## 2022-12-28 NOTE — Assessment & Plan Note (Addendum)
Well controlled.  No changes to medicines. Lopressor 100 mg twice daily, spironolactone 25 mg daily, torsemide 40 mg twice daily.  Continue to work on eating a healthy diet and exercise.  Labs reviewed today.

## 2022-12-30 ENCOUNTER — Ambulatory Visit: Payer: Medicare Other | Admitting: Cardiology

## 2022-12-30 DIAGNOSIS — I5030 Unspecified diastolic (congestive) heart failure: Secondary | ICD-10-CM | POA: Diagnosis not present

## 2022-12-30 DIAGNOSIS — E1122 Type 2 diabetes mellitus with diabetic chronic kidney disease: Secondary | ICD-10-CM | POA: Diagnosis not present

## 2022-12-30 DIAGNOSIS — D509 Iron deficiency anemia, unspecified: Secondary | ICD-10-CM | POA: Diagnosis not present

## 2022-12-30 DIAGNOSIS — N2581 Secondary hyperparathyroidism of renal origin: Secondary | ICD-10-CM | POA: Diagnosis not present

## 2022-12-30 DIAGNOSIS — N184 Chronic kidney disease, stage 4 (severe): Secondary | ICD-10-CM | POA: Diagnosis not present

## 2022-12-30 DIAGNOSIS — I129 Hypertensive chronic kidney disease with stage 1 through stage 4 chronic kidney disease, or unspecified chronic kidney disease: Secondary | ICD-10-CM | POA: Diagnosis not present

## 2022-12-30 DIAGNOSIS — M109 Gout, unspecified: Secondary | ICD-10-CM | POA: Diagnosis not present

## 2023-01-02 ENCOUNTER — Other Ambulatory Visit: Payer: Self-pay

## 2023-01-02 NOTE — Telephone Encounter (Signed)
Needs Levemir refilled - takes 50 units in the morning and 32 in the evening

## 2023-01-04 NOTE — Assessment & Plan Note (Signed)
Management by Dr Russella Dar

## 2023-01-05 MED ORDER — LEVEMIR FLEXPEN 100 UNIT/ML ~~LOC~~ SOPN: PEN_INJECTOR | SUBCUTANEOUS | 0 refills | Status: DC

## 2023-01-07 ENCOUNTER — Ambulatory Visit (INDEPENDENT_AMBULATORY_CARE_PROVIDER_SITE_OTHER): Payer: Medicare Other

## 2023-01-07 DIAGNOSIS — M810 Age-related osteoporosis without current pathological fracture: Secondary | ICD-10-CM | POA: Diagnosis not present

## 2023-01-07 MED ORDER — ROMOSOZUMAB-AQQG 105 MG/1.17ML ~~LOC~~ SOSY
210.0000 mg | PREFILLED_SYRINGE | Freq: Once | SUBCUTANEOUS | Status: AC
  Administered 2023-01-07: 210 mg via SUBCUTANEOUS

## 2023-01-07 NOTE — Progress Notes (Signed)
   Patient: Sara Hancock  DOB: 03-18-1935  MRN: [id number]    Visit Date: 01/07/2023    [name] presents today for her initial Evenity injection.  2 x 105 mg/1.17 ml Single-Dose Prefilled Syringes were given SQ, one in each arm.  Patient tolerated the injection well and has no questions.  The next Evenity injections will be due in one month.      Jomarie Longs, CMA

## 2023-01-08 ENCOUNTER — Other Ambulatory Visit: Payer: Self-pay

## 2023-01-08 ENCOUNTER — Telehealth: Payer: Self-pay

## 2023-01-08 NOTE — Telephone Encounter (Signed)
The patient called this morning stating that she does NOT want to continue with the Evenity Injection.

## 2023-01-16 ENCOUNTER — Other Ambulatory Visit: Payer: Self-pay | Admitting: Physician Assistant

## 2023-01-16 ENCOUNTER — Other Ambulatory Visit: Payer: Self-pay | Admitting: Family Medicine

## 2023-01-16 ENCOUNTER — Other Ambulatory Visit: Payer: Self-pay | Admitting: Cardiology

## 2023-01-16 ENCOUNTER — Other Ambulatory Visit: Payer: Self-pay | Admitting: Gastroenterology

## 2023-01-16 DIAGNOSIS — I480 Paroxysmal atrial fibrillation: Secondary | ICD-10-CM

## 2023-01-19 NOTE — Telephone Encounter (Signed)
Prescription refill request for Eliquis received. Indication:afib Last office visit:10/23 Scr:2.51  8/24 Age: 88 Weight:72.9  kg  Prescription refilled

## 2023-01-22 ENCOUNTER — Other Ambulatory Visit: Payer: Self-pay | Admitting: Cardiology

## 2023-01-28 DIAGNOSIS — D225 Melanocytic nevi of trunk: Secondary | ICD-10-CM | POA: Diagnosis not present

## 2023-01-28 DIAGNOSIS — D235 Other benign neoplasm of skin of trunk: Secondary | ICD-10-CM | POA: Diagnosis not present

## 2023-01-28 DIAGNOSIS — D2230 Melanocytic nevi of unspecified part of face: Secondary | ICD-10-CM | POA: Diagnosis not present

## 2023-01-28 DIAGNOSIS — L821 Other seborrheic keratosis: Secondary | ICD-10-CM | POA: Diagnosis not present

## 2023-01-28 DIAGNOSIS — Z85828 Personal history of other malignant neoplasm of skin: Secondary | ICD-10-CM | POA: Diagnosis not present

## 2023-01-28 DIAGNOSIS — D2272 Melanocytic nevi of left lower limb, including hip: Secondary | ICD-10-CM | POA: Diagnosis not present

## 2023-01-28 DIAGNOSIS — I872 Venous insufficiency (chronic) (peripheral): Secondary | ICD-10-CM | POA: Diagnosis not present

## 2023-01-28 DIAGNOSIS — Z86018 Personal history of other benign neoplasm: Secondary | ICD-10-CM | POA: Diagnosis not present

## 2023-01-28 DIAGNOSIS — D485 Neoplasm of uncertain behavior of skin: Secondary | ICD-10-CM | POA: Diagnosis not present

## 2023-01-28 DIAGNOSIS — L578 Other skin changes due to chronic exposure to nonionizing radiation: Secondary | ICD-10-CM | POA: Diagnosis not present

## 2023-01-28 DIAGNOSIS — Z0389 Encounter for observation for other suspected diseases and conditions ruled out: Secondary | ICD-10-CM | POA: Diagnosis not present

## 2023-02-20 ENCOUNTER — Other Ambulatory Visit: Payer: Self-pay | Admitting: Family Medicine

## 2023-02-20 DIAGNOSIS — I152 Hypertension secondary to endocrine disorders: Secondary | ICD-10-CM

## 2023-02-20 DIAGNOSIS — E1122 Type 2 diabetes mellitus with diabetic chronic kidney disease: Secondary | ICD-10-CM

## 2023-02-23 ENCOUNTER — Ambulatory Visit (INDEPENDENT_AMBULATORY_CARE_PROVIDER_SITE_OTHER): Payer: Medicare Other

## 2023-02-23 DIAGNOSIS — Z23 Encounter for immunization: Secondary | ICD-10-CM

## 2023-02-24 ENCOUNTER — Other Ambulatory Visit: Payer: Self-pay

## 2023-02-24 MED ORDER — ACCU-CHEK GUIDE VI STRP: ORAL_STRIP | 3 refills | Status: DC

## 2023-02-25 ENCOUNTER — Telehealth: Payer: Self-pay | Admitting: Gastroenterology

## 2023-02-25 NOTE — Telephone Encounter (Signed)
The pt has been advised that she is due for [place] the middle of Nov.  We will call her at that time to set up.  The pt has been advised of the information and verbalized understanding.

## 2023-02-25 NOTE — Telephone Encounter (Signed)
Inbound call from patient requesting a call to discuss if she still needs to continue to have her 6 month abdominal ultrasound.  Please advise.

## 2023-03-04 ENCOUNTER — Ambulatory Visit: Payer: Medicare Other | Admitting: Cardiology

## 2023-03-04 ENCOUNTER — Encounter: Payer: Self-pay | Admitting: Cardiology

## 2023-03-04 ENCOUNTER — Ambulatory Visit: Payer: Medicare Other | Attending: Cardiology | Admitting: Cardiology

## 2023-03-04 VITALS — BP 121/76 | HR 79 | Ht 62.0 in | Wt 158.2 lb

## 2023-03-04 DIAGNOSIS — E1122 Type 2 diabetes mellitus with diabetic chronic kidney disease: Secondary | ICD-10-CM | POA: Diagnosis not present

## 2023-03-04 DIAGNOSIS — Z79899 Other long term (current) drug therapy: Secondary | ICD-10-CM | POA: Insufficient documentation

## 2023-03-04 DIAGNOSIS — E782 Mixed hyperlipidemia: Secondary | ICD-10-CM | POA: Diagnosis not present

## 2023-03-04 DIAGNOSIS — N184 Chronic kidney disease, stage 4 (severe): Secondary | ICD-10-CM | POA: Insufficient documentation

## 2023-03-04 DIAGNOSIS — I4821 Permanent atrial fibrillation: Secondary | ICD-10-CM | POA: Diagnosis not present

## 2023-03-04 NOTE — Progress Notes (Signed)
Cardiology Office Note:    Date:  03/04/2023   ID:  Sara Hancock, DOB Sep 07, 1934, MRN [id number]  PCP:  Blane Ohara, MD  Cardiologist:  Thomasene Ripple, DO  Electrophysiologist:  None   Referring MD: Blane Ohara, MD   " I am doing well"  History of Present Illness:    Sara Hancock is a 88 y.o. female with a hx of  atrial fibrillation on Coumadin and metoprolol, diastolic heart failure, hypertension presents today for follow-up visit.      Last saw the patient in October 2023 at that time she was doing well and was euvolemic no changes were made to her diuretics.  Past Medical History:  Diagnosis Date   Acquired thrombophilia (HCC) 09/11/2019   Allergic rhinitis 09/17/2015   Anemia    ANEMIA, NORMOCYTIC 10/27/2007   Qualifier: Diagnosis of  By: Koleen Distance CMA (AAMA), Leisha     Anticoagulant long-term use 01/08/2011   Arthritis    Benign neoplasm of colon 01/08/2011   Biliary cirrhosis (HCC)    BRADYCARDIA 11/15/2008   Qualifier: Diagnosis of  By: Wyn Quaker CMA, Amanda     Cellulitis of left foot 09/25/2016   Cholelithiasis 01/09/12   Chronic kidney disease, stage 3 (HCC)    Combined form of age-related cataract, both eyes 02/26/2017   Diabetes mellitus type 2 without retinopathy (HCC) 10/27/2013   Diverticulosis of colon with hemorrhage 06/08/2000   Dyslipidemia 09/17/2015   Encounter for therapeutic drug monitoring 06/09/2013   Esophageal reflux 01/08/2011   Esophageal stenosis 01/08/11   Esophagitis 01/08/11   GALLSTONES 10/26/2008   Qualifier: Diagnosis of  By: Jarold Motto MD Lang Snow    Gastritis, erosive chronic 01/08/2011   GERD (gastroesophageal reflux disease)    Glaucoma suspect of both eyes 02/26/2017   Glaucoma suspect of both eyes 02/26/2017   Hiatal hernia 01/08/11   History of IBS 10/27/2007   Qualifier: Diagnosis of  By: Koleen Distance CMA (AAMA), Hulan Saas     HTN (hypertension)    Hx of adenomatous colonic polyps 01/08/11   Hyperplastic colon polyp 06/08/2000, 07/31/2003,  01/08/11   Hypertension, essential 10/27/2007   Qualifier: Diagnosis of  By: Koleen Distance CMA (AAMA), Hulan Saas     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, with stage 1 through stage 4 chronic kidney disease, or unspecified chronic kidney disease    Iron deficiency anemia 01/08/2011   Left foot pain 09/25/2016   Localized edema    Long term (current) use of insulin (HCC)    Mixed hyperlipidemia    Nuclear cataract 07/03/2011   Obesity    Obesity (BMI 30-39.9) 03/22/2012   Osteoarthritis of right thumb 09/17/2015   Osteopenia    Other iron deficiency anemias    Other specified menopausal and perimenopausal disorders    PAF (paroxysmal atrial fibrillation) (HCC)    occurring in the setting of bradycardia and mild first degree AV block followed by Dr. Clide Cliff   Pain in thoracic spine    Paraesophageal hiatal hernia, 3cm by EGD 03/22/2012   Pedal edema 12/13/2019   Persistent atrial fibrillation (HCC) 08/28/2019   Posterior vitreous detachment, bilateral 01/10/2016   Primary biliary cholangitis (HCC) 10/27/2007   Qualifier: Diagnosis of  By: Koleen Distance CMA (AAMA), Hulan Saas     Pure hypercholesterolemia    Retinal tear, right 07/03/2011   Shortness of breath 12/13/2019   Stage 3b chronic kidney disease (HCC) 09/11/2019   Type II or unspecified type diabetes mellitus without mention of complication, not stated  as uncontrolled    Uterine fibroid 01/09/12    Past Surgical History:  Procedure Laterality Date   BREAST CYST EXCISION     left   CARDIOVERSION N/A 11/27/2017   Procedure: CARDIOVERSION;  Surgeon: Thurmon Fair, MD;  Location: [facility];  Service: Cardiovascular;  Laterality: N/A;   COLONOSCOPY     INSERTION OF MESH  05/18/2012   Procedure: INSERTION OF MESH;  Surgeon: Ardeth Sportsman, MD;  Location: [facility];  Service: General;  Laterality: N/A;   LIVER BIOPSY  05/18/2012   Procedure: LIVER BIOPSY;  Surgeon: Ardeth Sportsman, MD;  Location: [facility];  Service: General;;    POLYPECTOMY     THYROIDECTOMY, PARTIAL  1980   TUBAL LIGATION  1972   VENTRAL HERNIA REPAIR  05/18/2012   Procedure: LAPAROSCOPIC VENTRAL HERNIA;  Surgeon: Ardeth Sportsman, MD;  Location: [facility];  Service: General;  Laterality: N/A;  Laparoscopic Ventral Wall Hernia with Mesh    Current Medications: Current Meds  Medication Sig   Accu-Chek FastClix Lancets MISC USE TO CHECK BLOOD SUGAR TWICE DAILY   allopurinol (ZYLOPRIM) 100 MG tablet Take 100 mg by mouth daily.   amLODipine (NORVASC) 2.5 MG tablet TAKE 1 TABLET(2.5 MG) BY MOUTH DAILY   atorvastatin (LIPITOR) 40 MG tablet TAKE 1 TABLET(40 MG) BY MOUTH DAILY   BD PEN NEEDLE NANO 2ND GEN 32G X 4 MM MISC USE A NEEDLE EACH TIME FOR INSULIN INJECTION   calcium-vitamin D (OSCAL WITH D) 250-125 MG-UNIT per tablet Take 1 tablet by mouth 2 (two) times daily.   Cholecalciferol (VITAMIN D3) 1000 units CAPS Take 1,000 Units by mouth daily.   ELIQUIS 2.5 MG TABS tablet TAKE 1 TABLET(2.5 MG) BY MOUTH TWICE DAILY.   fexofenadine (ALLEGRA ALLERGY) 60 MG tablet Take 1 tablet (60 mg total) by mouth 2 (two) times daily. (Patient taking differently: Take 60 mg by mouth daily.)   fluticasone (FLONASE) 50 MCG/ACT nasal spray Place 1 spray into both nostrils as needed.   folic acid (FOLVITE) 400 MCG tablet Take 400 mcg by mouth daily.   glucose blood (ACCU-CHEK GUIDE) test strip USE AS INSTRUCTED TWICE DAILY   insulin detemir (LEVEMIR FLEXPEN) 100 UNIT/ML FlexPen INJECT 50 UNITS UNDER THE SKIN EVERY MORNING AND 32 UNITS WITH DINNER   metolazone (ZAROXOLYN) 5 MG tablet TAKE 1 TABLET(5 MG) BY MOUTH 1 TIME A WEEK   metoprolol tartrate (LOPRESSOR) 100 MG tablet TAKE 1 TABLET(100 MG) BY MOUTH TWICE DAILY   Multiple Vitamin (MULTIVITAMIN WITH MINERALS) TABS Take 1 tablet by mouth daily.   nystatin cream (MYCOSTATIN) APPLY SUFFICIENT AMOUNT TOPICALLY TO THE AFFECTED AREA TWICE DAILY   pantoprazole (PROTONIX) 40 MG tablet TAKE 1 TABLET BY MOUTH EVERY DAY   potassium  chloride SA (KLOR-CON M) 20 MEQ tablet Take 1 tablet (20 mEq total) by mouth daily.   spironolactone (ALDACTONE) 25 MG tablet Take 25 mg by mouth daily.   torsemide (DEMADEX) 20 MG tablet TAKE 2 TABLETS BY MOUTH TWICE DAILY (Patient taking differently: 20 mg daily.)   ursodiol (ACTIGALL) 300 MG capsule TAKE 1 CAPSULE(300 MG) BY MOUTH TWICE DAILY   vitamin C (ASCORBIC ACID) 500 MG tablet Take 500 mg by mouth daily.   vitamin E 400 UNIT capsule Take 400 Units by mouth daily.     Allergies:   Ace inhibitors, Capoten [captopril], and Neomycin-bacitracin zn-polymyx   Social History   Socioeconomic History   Marital status: Married    Spouse name: Not on file  Number of children: 2   Years of education: Not on file   Highest education level: Not on file  Occupational History   Occupation: retired    Associate Professor: RETIRED  Tobacco Use   Smoking status: Never   Smokeless tobacco: Never  Vaping Use   Vaping status: Never Used  Substance and Sexual Activity   Alcohol use: No   Drug use: No   Sexual activity: Not Currently  Other Topics Concern   Not on file  Social History Narrative   Not on file   Social Determinants of Health   Financial Resource Strain: Low Risk  (08/12/2021)   Overall Financial Resource Strain (CARDIA)    Difficulty of Paying Living Expenses: Not hard at all  Food Insecurity: No Food Insecurity (01/08/2022)   Hunger Vital Sign    Worried About Running Out of Food in the Last Year: Never true    Ran Out of Food in the Last Year: Never true  Transportation Needs: No Transportation Needs (01/08/2022)   PRAPARE - Administrator, Civil Service (Medical): No    Lack of Transportation (Non-Medical): No  Physical Activity: Not on file  Stress: Not on file  Social Connections: Not on file     Family History: The patient's family history includes Aneurysm in her sister; Arrhythmia in an other family member; Arthritis in an other family member; Congestive  Heart Failure in her sister and another family member; Diabetes in her brother and sister; Heart attack in her father; Hyperlipidemia in an other family member; Hypertension in an other family member; Kidney failure in an other family member; Stroke in her sister; Stroke (age of onset: 37) in her mother. There is no history of Colon cancer, Esophageal cancer, Stomach cancer, or Rectal cancer.  ROS:   Review of Systems  Constitution: Negative for decreased appetite, fever and weight gain.  HENT: Negative for congestion, ear discharge, hoarse voice and sore throat.   Eyes: Negative for discharge, redness, vision loss in right eye and visual halos.  Cardiovascular: Negative for chest pain, dyspnea on exertion, leg swelling, orthopnea and palpitations.  Respiratory: Negative for cough, hemoptysis, shortness of breath and snoring.   Endocrine: Negative for heat intolerance and polyphagia.  Hematologic/Lymphatic: Negative for bleeding problem. Does not bruise/bleed easily.  Skin: Negative for flushing, nail changes, rash and suspicious lesions.  Musculoskeletal: Negative for arthritis, joint pain, muscle cramps, myalgias, neck pain and stiffness.  Gastrointestinal: Negative for abdominal pain, bowel incontinence, diarrhea and excessive appetite.  Genitourinary: Negative for decreased libido, genital sores and incomplete emptying.  Neurological: Negative for brief paralysis, focal weakness, headaches and loss of balance.  Psychiatric/Behavioral: Negative for altered mental status, depression and suicidal ideas.  Allergic/Immunologic: Negative for HIV exposure and persistent infections.    EKGs/Labs/Other Studies Reviewed:    The following studies were reviewed today:   EKG: atrial fibrillation with 90s beats per minute.  Recent Labs: 12/22/2022: ALT 12; BUN 93; Creatinine, Ser 2.51; Hemoglobin 11.6; Platelets 195; Potassium 4.1; Sodium 142  Recent Lipid Panel    Component Value Date/Time    CHOL 130 12/22/2022 0934   TRIG 83 12/22/2022 0934   HDL 56 12/22/2022 0934   CHOLHDL 2.3 12/22/2022 0934   LDLCALC 58 12/22/2022 0934    Physical Exam:    VS:  BP 121/76 (BP Location: Left Arm, Patient Position: Sitting, Cuff Size: Normal)   Pulse 79   Ht 5\' 2"  (1.575 m)   Wt 158 lb 3.2 oz (  71.8 kg)   SpO2 96%   BMI 28.94 kg/m     Wt Readings from Last 3 Encounters:  03/04/23 158 lb 3.2 oz (71.8 kg)  12/25/22 160 lb 12.8 oz (72.9 kg)  09/04/22 161 lb 6.4 oz (73.2 kg)     GEN: Well nourished, well developed in no acute distress HEENT: Normal NECK: No JVD; No carotid bruits LYMPHATICS: No lymphadenopathy CARDIAC: S1S2 noted,RRR, no murmurs, rubs, gallops RESPIRATORY:  Clear to auscultation without rales, wheezing or rhonchi  ABDOMEN: Soft, non-tender, non-distended, +bowel sounds, no guarding. EXTREMITIES: No edema, No cyanosis, no clubbing MUSCULOSKELETAL:  No deformity  SKIN: Warm and dry NEUROLOGIC:  Alert and oriented x 3, non-focal PSYCHIATRIC:  Normal affect, good insight  ASSESSMENT:    1. Permanent atrial fibrillation (HCC)   2. Medication management   3. CKD stage 4 due to type 2 diabetes mellitus (HCC)   4. Mixed hyperlipidemia     PLAN:    Clinically she appears to be euvolemic.  She is now taking the torsemide 20 mg twice a day.  We will keep her on that.  In the meantime we will give the patient metolazone once weekly hoping this will be okay with my nephrology colleagues.  We will get blood work today to assess kidney function.  I also have discussed with the patient on how to use this medication.  She expresses understanding.  Permanent atrial fibrillation-continue current medication regimen.  Chronic and states she is continue to monitor creatinine.  Blood pressure is acceptable, continue with current antihypertensive regimen.   The patient is in agreement with the above plan. The patient left the office in stable condition.  The patient will  follow up in 6 months or sooner if needed.   Medication Adjustments/Labs and Tests Ordered: Current medicines are reviewed at length with the patient today.  Concerns regarding medicines are outlined above.  Orders Placed This Encounter  Procedures   EKG 12-Lead   No orders of the defined types were placed in this encounter.   Patient Instructions  Medication Instructions:  Your physician recommends that you continue on your current medications as directed. Please refer to the Current Medication list given to you today.  *If you need a refill on your cardiac medications before your next appointment, please call your pharmacy*   Lab Work None   Testing/Procedures: None   Follow-Up: At [facility], you and your health needs are our priority.  As part of our continuing mission to provide you with exceptional heart care, we have created designated Provider Care Teams.  These Care Teams include your primary Cardiologist (physician) and Advanced Practice Providers (APPs -  Physician Assistants and Nurse Practitioners) who all work together to provide you with the care you need, when you need it.  We recommend signing up for the patient portal called "MyChart".  Sign up information is provided on this After Visit Summary.  MyChart is used to connect with patients for Virtual Visits (Telemedicine).  Patients are able to view lab/test results, encounter notes, upcoming appointments, etc.  Non-urgent messages can be sent to your provider as well.   To learn more about what you can do with MyChart, go to [email or web address].    Your next appointment:   1 year  Provider:   Thomasene Ripple, DO      Adopting a Healthy Lifestyle.  Know what a healthy weight is for you (roughly BMI <25) and aim to maintain this   Aim for  7+ servings of fruits and vegetables daily   65-80+ fluid ounces of water or unsweet tea for healthy kidneys   Limit to max 1 drink of alcohol per day; avoid  smoking/tobacco   Limit animal fats in diet for cholesterol and heart health - choose grass fed whenever available   Avoid highly processed foods, and foods high in saturated/trans fats   Aim for low stress - take time to unwind and care for your mental health   Aim for 150 min of moderate intensity exercise weekly for heart health, and weights twice weekly for bone health   Aim for 7-9 hours of sleep daily   When it comes to diets, agreement about the perfect plan isnt easy to find, even among the experts. Experts at the [facility] of [employer] developed an idea known as the Healthy Eating Plate. Just imagine a plate divided into logical, healthy portions.   The emphasis is on diet quality:   Load up on vegetables and fruits - one-half of your plate: Aim for color and variety, and remember that potatoes dont count.   Go for whole grains - one-quarter of your plate: Whole wheat, barley, wheat berries, quinoa, oats, brown rice, and foods made with them. If you want pasta, go with whole wheat pasta.   Protein power - one-quarter of your plate: Fish, chicken, beans, and nuts are all healthy, versatile protein sources. Limit red meat.   The diet, however, does go beyond the plate, offering a few other suggestions.   Use healthy plant oils, such as olive, canola, soy, corn, sunflower and peanut. Check the labels, and avoid partially hydrogenated oil, which have unhealthy trans fats.   If youre thirsty, drink water. Coffee and tea are good in moderation, but skip sugary drinks and limit milk and dairy products to one or two daily servings.   The type of carbohydrate in the diet is more important than the amount. Some sources of carbohydrates, such as vegetables, fruits, whole grains, and beans-are healthier than others.   Finally, stay active  Signed, Thomasene Ripple, DO  03/04/2023 2:01 PM    [facility]

## 2023-03-04 NOTE — Patient Instructions (Addendum)
Medication Instructions:  Your physician recommends that you continue on your current medications as directed. Please refer to the Current Medication list given to you today.  *If you need a refill on your cardiac medications before your next appointment, please call your pharmacy*   Lab Work None   Testing/Procedures: None   Follow-Up: At [facility], you and your health needs are our priority.  As part of our continuing mission to provide you with exceptional heart care, we have created designated Provider Care Teams.  These Care Teams include your primary Cardiologist (physician) and Advanced Practice Providers (APPs -  Physician Assistants and Nurse Practitioners) who all work together to provide you with the care you need, when you need it.  We recommend signing up for the patient portal called "MyChart".  Sign up information is provided on this After Visit Summary.  MyChart is used to connect with patients for Virtual Visits (Telemedicine).  Patients are able to view lab/test results, encounter notes, upcoming appointments, etc.  Non-urgent messages can be sent to your provider as well.   To learn more about what you can do with MyChart, go to [email or web address].    Your next appointment:   1 year  Provider:   Thomasene Ripple, DO

## 2023-03-05 ENCOUNTER — Telehealth: Payer: Self-pay | Admitting: Cardiology

## 2023-03-05 MED ORDER — METOLAZONE 5 MG PO TABS: 5.0000 mg | ORAL_TABLET | Freq: Every day | ORAL | 1 refills | Status: DC

## 2023-03-05 NOTE — Telephone Encounter (Signed)
*  STAT* If patient is at the pharmacy, call can be transferred to refill team.   1. Which medications need to be refilled? (please list name of each medication and dose if known)   metolazone (ZAROXOLYN) 5 MG tablet  TAKE 1 TABLET(5 MG) BY MOUTH 1 TIME A WEEK    2. Would you like to learn more about the convenience, safety, & potential cost savings by using the [facility]? No    3. Are you open to using the [facility] No  4. Which pharmacy/location (including street and city if local pharmacy) is medication to be sent to? [id number] - [place], [place] - [address]    5. Do they need a 30 day or 90 day supply? 90 Day Supply

## 2023-03-09 ENCOUNTER — Other Ambulatory Visit: Payer: Self-pay

## 2023-03-10 ENCOUNTER — Other Ambulatory Visit: Payer: Self-pay

## 2023-03-10 MED ORDER — LANTUS SOLOSTAR 100 UNIT/ML ~~LOC~~ SOPN: PEN_INJECTOR | SUBCUTANEOUS | 99 refills | Status: DC

## 2023-03-12 ENCOUNTER — Ambulatory Visit (INDEPENDENT_AMBULATORY_CARE_PROVIDER_SITE_OTHER): Payer: Medicare Other

## 2023-03-12 DIAGNOSIS — Z23 Encounter for immunization: Secondary | ICD-10-CM

## 2023-03-23 ENCOUNTER — Telehealth: Payer: Self-pay

## 2023-03-23 DIAGNOSIS — K745 Biliary cirrhosis, unspecified: Secondary | ICD-10-CM

## 2023-03-23 NOTE — Telephone Encounter (Signed)
-----   Message from Nurse Magnus Crescenzo P sent at 09/19/2022 10:47 AM EDT ----- RUQ [place] in 6 months

## 2023-03-24 NOTE — Telephone Encounter (Signed)
Left message on machine to call back  

## 2023-03-24 NOTE — Telephone Encounter (Signed)
The pt returned call and ok'd the RUQ [place]. Order has been entered and sent to the schedulers.

## 2023-03-30 DIAGNOSIS — N184 Chronic kidney disease, stage 4 (severe): Secondary | ICD-10-CM | POA: Diagnosis not present

## 2023-03-31 ENCOUNTER — Ambulatory Visit (HOSPITAL_COMMUNITY)
Admission: RE | Admit: 2023-03-31 | Discharge: 2023-03-31 | Disposition: A | Discharge status: Home / Self Care | Payer: Medicare Other | Source: Ambulatory Visit | Attending: Gastroenterology | Admitting: Gastroenterology

## 2023-03-31 DIAGNOSIS — K745 Biliary cirrhosis, unspecified: Secondary | ICD-10-CM | POA: Diagnosis not present

## 2023-03-31 DIAGNOSIS — K7689 Other specified diseases of liver: Secondary | ICD-10-CM | POA: Diagnosis not present

## 2023-03-31 DIAGNOSIS — K7460 Unspecified cirrhosis of liver: Secondary | ICD-10-CM | POA: Diagnosis not present

## 2023-03-31 DIAGNOSIS — K828 Other specified diseases of gallbladder: Secondary | ICD-10-CM | POA: Diagnosis not present

## 2023-03-31 DIAGNOSIS — K8020 Calculus of gallbladder without cholecystitis without obstruction: Secondary | ICD-10-CM | POA: Diagnosis not present

## 2023-04-06 DIAGNOSIS — E1122 Type 2 diabetes mellitus with diabetic chronic kidney disease: Secondary | ICD-10-CM | POA: Diagnosis not present

## 2023-04-06 DIAGNOSIS — N2581 Secondary hyperparathyroidism of renal origin: Secondary | ICD-10-CM | POA: Diagnosis not present

## 2023-04-06 DIAGNOSIS — N184 Chronic kidney disease, stage 4 (severe): Secondary | ICD-10-CM | POA: Diagnosis not present

## 2023-04-06 DIAGNOSIS — D631 Anemia in chronic kidney disease: Secondary | ICD-10-CM | POA: Diagnosis not present

## 2023-04-06 DIAGNOSIS — I129 Hypertensive chronic kidney disease with stage 1 through stage 4 chronic kidney disease, or unspecified chronic kidney disease: Secondary | ICD-10-CM | POA: Diagnosis not present

## 2023-04-06 DIAGNOSIS — I5030 Unspecified diastolic (congestive) heart failure: Secondary | ICD-10-CM | POA: Diagnosis not present

## 2023-04-06 NOTE — Telephone Encounter (Signed)
Calling in regards to results. Please advise.

## 2023-04-06 NOTE — Telephone Encounter (Signed)
The pt has been advised that as soon as Dr Russella Dar reviews the results we will call her.  The pt has been advised of the information and verbalized understanding.

## 2023-04-13 ENCOUNTER — Other Ambulatory Visit: Payer: Self-pay | Admitting: Family Medicine

## 2023-04-13 ENCOUNTER — Other Ambulatory Visit: Payer: Self-pay | Admitting: Cardiology

## 2023-04-13 ENCOUNTER — Other Ambulatory Visit: Payer: Self-pay | Admitting: Gastroenterology

## 2023-04-14 ENCOUNTER — Other Ambulatory Visit: Payer: Self-pay

## 2023-04-14 MED ORDER — METOLAZONE 5 MG PO TABS: 5.0000 mg | ORAL_TABLET | Freq: Every day | ORAL | 3 refills | Status: DC

## 2023-04-15 ENCOUNTER — Other Ambulatory Visit: Payer: Self-pay | Admitting: *Deleted

## 2023-04-15 MED ORDER — METOLAZONE 5 MG PO TABS: 5.0000 mg | ORAL_TABLET | ORAL | 3 refills | Status: DC

## 2023-04-15 NOTE — Addendum Note (Signed)
Addended by: Adriana Simas, Zacarias Krauter L on: 04/15/2023 07:54 AM   Modules accepted: Orders

## 2023-04-17 ENCOUNTER — Telehealth: Payer: Self-pay | Admitting: Gastroenterology

## 2023-04-17 MED ORDER — URSODIOL 300 MG PO CAPS: ORAL_CAPSULE | ORAL | 0 refills | Status: DC

## 2023-04-17 NOTE — Telephone Encounter (Signed)
Prescription sent to patient's pharmacy and patient notified on prescription to schedule an appt for any further refills.

## 2023-04-17 NOTE — Telephone Encounter (Signed)
Patient called to get a Ursodiol refill please advise.

## 2023-04-19 ENCOUNTER — Other Ambulatory Visit: Payer: Self-pay

## 2023-04-19 DIAGNOSIS — E782 Mixed hyperlipidemia: Secondary | ICD-10-CM

## 2023-04-19 DIAGNOSIS — E119 Type 2 diabetes mellitus without complications: Secondary | ICD-10-CM

## 2023-04-19 DIAGNOSIS — I131 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, with stage 1 through stage 4 chronic kidney disease, or unspecified chronic kidney disease: Secondary | ICD-10-CM

## 2023-04-20 ENCOUNTER — Other Ambulatory Visit: Payer: Medicare Other

## 2023-04-20 DIAGNOSIS — E782 Mixed hyperlipidemia: Secondary | ICD-10-CM

## 2023-04-20 DIAGNOSIS — E119 Type 2 diabetes mellitus without complications: Secondary | ICD-10-CM | POA: Diagnosis not present

## 2023-04-20 DIAGNOSIS — I131 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, with stage 1 through stage 4 chronic kidney disease, or unspecified chronic kidney disease: Secondary | ICD-10-CM

## 2023-04-21 ENCOUNTER — Ambulatory Visit (INDEPENDENT_AMBULATORY_CARE_PROVIDER_SITE_OTHER): Payer: Medicare Other | Admitting: Gastroenterology

## 2023-04-21 ENCOUNTER — Encounter: Payer: Self-pay | Admitting: Gastroenterology

## 2023-04-21 ENCOUNTER — Telehealth: Payer: Self-pay

## 2023-04-21 VITALS — BP 104/70 | HR 80 | Ht 62.0 in | Wt 156.0 lb

## 2023-04-21 DIAGNOSIS — K8020 Calculus of gallbladder without cholecystitis without obstruction: Secondary | ICD-10-CM | POA: Diagnosis not present

## 2023-04-21 DIAGNOSIS — K743 Primary biliary cirrhosis: Secondary | ICD-10-CM | POA: Diagnosis not present

## 2023-04-21 DIAGNOSIS — Z860101 Personal history of adenomatous and serrated colon polyps: Secondary | ICD-10-CM | POA: Diagnosis not present

## 2023-04-21 DIAGNOSIS — R143 Flatulence: Secondary | ICD-10-CM

## 2023-04-21 DIAGNOSIS — K219 Gastro-esophageal reflux disease without esophagitis: Secondary | ICD-10-CM | POA: Diagnosis not present

## 2023-04-21 LAB — COMPREHENSIVE METABOLIC PANEL
ALT: 13 [IU]/L (ref 0–32)
AST: 25 [IU]/L (ref 0–40)
Albumin: 3.9 g/dL (ref 3.7–4.7)
Alkaline Phosphatase: 166 [IU]/L — ABNORMAL HIGH (ref 44–121)
BUN/Creatinine Ratio: 37 — ABNORMAL HIGH (ref 12–28)
BUN: 94 mg/dL (ref 8–27)
Bilirubin Total: 0.4 mg/dL (ref 0.0–1.2)
CO2: 25 mmol/L (ref 20–29)
Calcium: 10 mg/dL (ref 8.7–10.3)
Chloride: 96 mmol/L (ref 96–106)
Creatinine, Ser: 2.51 mg/dL — ABNORMAL HIGH (ref 0.57–1.00)
Globulin, Total: 3.5 g/dL (ref 1.5–4.5)
Glucose: 152 mg/dL — ABNORMAL HIGH (ref 70–99)
Potassium: 3.8 mmol/L (ref 3.5–5.2)
Sodium: 142 mmol/L (ref 134–144)
Total Protein: 7.4 g/dL (ref 6.0–8.5)
eGFR: 18 mL/min/{1.73_m2} — ABNORMAL LOW (ref >59)

## 2023-04-21 LAB — HEMOGLOBIN A1C
Est. average glucose Bld gHb Est-mCnc: 206 mg/dL
Hgb A1c MFr Bld: 8.8 % — ABNORMAL HIGH (ref 4.8–5.6)

## 2023-04-21 LAB — CBC WITH DIFFERENTIAL/PLATELET
Basophils Absolute: 0 10*3/uL (ref 0.0–0.2)
Basos: 0 %
EOS (ABSOLUTE): 0 10*3/uL (ref 0.0–0.4)
Eos: 0 %
Hematocrit: 36.2 % (ref 34.0–46.6)
Hemoglobin: 11.7 g/dL (ref 11.1–15.9)
Immature Grans (Abs): 0 10*3/uL (ref 0.0–0.1)
Immature Granulocytes: 0 %
Lymphocytes Absolute: 1.7 10*3/uL (ref 0.7–3.1)
Lymphs: 19 %
MCH: 31.6 pg (ref 26.6–33.0)
MCHC: 32.3 g/dL (ref 31.5–35.7)
MCV: 98 fL — ABNORMAL HIGH (ref 79–97)
Monocytes Absolute: 1 10*3/uL — ABNORMAL HIGH (ref 0.1–0.9)
Monocytes: 11 %
Neutrophils Absolute: 5.9 10*3/uL (ref 1.4–7.0)
Neutrophils: 70 %
Platelets: 191 10*3/uL (ref 150–450)
RBC: 3.7 x10E6/uL — ABNORMAL LOW (ref 3.77–5.28)
RDW: 13.7 % (ref 11.7–15.4)
WBC: 8.7 10*3/uL (ref 3.4–10.8)

## 2023-04-21 LAB — LIPID PANEL
Chol/HDL Ratio: 2.4 {ratio} (ref 0.0–4.4)
Cholesterol, Total: 112 mg/dL (ref 100–199)
HDL: 46 mg/dL (ref >39)
LDL Chol Calc (NIH): 48 mg/dL (ref 0–99)
Triglycerides: 96 mg/dL (ref 0–149)
VLDL Cholesterol Cal: 18 mg/dL (ref 5–40)

## 2023-04-21 MED ORDER — URSODIOL 300 MG PO CAPS: ORAL_CAPSULE | ORAL | 3 refills | Status: DC

## 2023-04-21 NOTE — Telephone Encounter (Signed)
Copied from CRM [id number]. Topic: Clinical - Lab/Test Results >> Apr 21, 2023 11:43 AM Prudencio Pair wrote: Reason for CRM: Crystal, with [facility], called to provide results for patient. She stated patient has BUN of 94.

## 2023-04-21 NOTE — Patient Instructions (Signed)
We have sent the following medications to your pharmacy for you to pick up at your convenience: Ursodiol.  Please follow up with Dr. Marina Goodell in 6 months.  The [facility] providers would like to encourage you to use [facility] to communicate with providers for non-urgent requests or questions.  Due to long hold times on the telephone, sending your provider a message by [facility] may be a faster and more efficient way to get a response.  Please allow 48 business hours for a response.  Please remember that this is for non-urgent requests.   Thank you for choosing me and [facility].  Venita Lick. Pleas Koch., MD., Clementeen Graham

## 2023-04-21 NOTE — Progress Notes (Signed)
Assessment     Primary biliary cholangitis with compensated cirrhosis Intestinal gas, flatus - suspected lactose intolerance GERD with history of an esophageal stricture  Personal history of adenomatous, sessile serrated colon polyps, no longer in surveillance due to age Cholelithiasis  CKD4 Pulm HTN PAF on Eliquis   Recommendations    Continue ursodiol 300 mg bid HCC screening - RUQ [place], blood work in 6 months Discontinue all lactose products for 7 days and assess symptoms.  If symptoms persist begin a low gas diet and Gas-X 3 times daily prn. Continue pantoprazole 40 mg every day, follow antireflux measures 3.   REV with Dr. Marina Goodell (per patient request) in 6 months    HPI    This is a 88 year old female returning for follow-up of primary biliary cholangitis with compensated cirrhosis.  She relates worsening problems with intestinal gas and flatus for the past several weeks.  After further discussion she now correlates her increase in intestinal gas with an increase in daily milk intake.   Labs / Imaging       Latest Ref Rng & Units 04/20/2023   10:25 AM 12/22/2022    9:34 AM 09/26/2022   11:28 AM  Hepatic Function  Total Protein 6.0 - 8.5 g/dL 7.4  7.5    Albumin 3.7 - 4.7 g/dL 3.9  4.1  3.8   AST 0 - 40 IU/L 25  22    ALT 0 - 32 IU/L 13  12    Alk Phosphatase 44 - 121 IU/L 166  178    Total Bilirubin 0.0 - 1.2 mg/dL 0.4  0.4         Latest Ref Rng & Units 04/20/2023   10:25 AM 12/22/2022    9:34 AM 09/26/2022   11:28 AM  CBC  WBC 3.4 - 10.8 x10E3/uL 8.7  8.7  8.9   Hemoglobin 11.1 - 15.9 g/dL 40.1  02.7  25.3   Hematocrit 34.0 - 46.6 % 36.2  34.9  32.5   Platelets 150 - 450 x10E3/uL 191  195  202      US Abdomen Limited RUQ (LIVER/GB) CLINICAL DATA:  Cirrhosis  EXAM: ULTRASOUND ABDOMEN LIMITED RIGHT UPPER QUADRANT  COMPARISON:  Ultrasound 09/18/2022  FINDINGS: Gallbladder:  Large 2.2 cm stone in the gallbladder lumen. Gallbladder is  mildly distended. No gallbladder wall thickening or pericholecystic fluid. Negative sonographic Murphy's sign.  Common bile duct:  Diameter: Not visualized  Liver:  Coarsened echogenicity and nodular contour. No focal lesion. Portal vein is patent on color Doppler imaging with normal direction of blood flow towards the liver.  Other: None.  IMPRESSION: 1. Cirrhotic morphology of the liver. No focal lesion. 2. Cholelithiasis without secondary signs of acute cholecystitis.  Electronically Signed   By: Annia Belt M.D.   On: 03/31/2023 13:38   Current Medications, Allergies, Past Medical History, Past Surgical History, Family History and Social History were reviewed in [employer] record.   Physical Exam: General: Well developed, well nourished, no acute distress Head: Normocephalic and atraumatic Eyes: Sclerae anicteric, EOMI Ears: Normal auditory acuity Mouth: No deformities or lesions noted Lungs: Clear throughout to auscultation Heart: Regular rate and rhythm; No murmurs, rubs or bruits Abdomen: Soft, non tender and non distended. No masses, hepatosplenomegaly or hernias noted. Normal Bowel sounds Rectal: Not done Musculoskeletal: Symmetrical with no gross deformities  Pulses:  Normal pulses noted Extremities: No edema or deformities noted Neurological: Alert oriented x 4, grossly nonfocal Psychological:  Alert and cooperative. Normal mood and affect   Arushi Partridge T. Russella Dar, MD 04/21/2023, 10:34 AM

## 2023-04-23 ENCOUNTER — Encounter: Payer: Self-pay | Admitting: Family Medicine

## 2023-04-23 ENCOUNTER — Ambulatory Visit: Payer: Medicare Other | Admitting: Family Medicine

## 2023-04-23 VITALS — BP 120/80 | HR 89 | Temp 97.0°F | Resp 16 | Ht 62.0 in | Wt 157.0 lb

## 2023-04-23 DIAGNOSIS — Z794 Long term (current) use of insulin: Secondary | ICD-10-CM

## 2023-04-23 DIAGNOSIS — K743 Primary biliary cirrhosis: Secondary | ICD-10-CM | POA: Diagnosis not present

## 2023-04-23 DIAGNOSIS — E119 Type 2 diabetes mellitus without complications: Secondary | ICD-10-CM

## 2023-04-23 DIAGNOSIS — Z0000 Encounter for general adult medical examination without abnormal findings: Secondary | ICD-10-CM

## 2023-04-23 DIAGNOSIS — E782 Mixed hyperlipidemia: Secondary | ICD-10-CM | POA: Diagnosis not present

## 2023-04-23 DIAGNOSIS — E1122 Type 2 diabetes mellitus with diabetic chronic kidney disease: Secondary | ICD-10-CM | POA: Diagnosis not present

## 2023-04-23 DIAGNOSIS — M810 Age-related osteoporosis without current pathological fracture: Secondary | ICD-10-CM | POA: Diagnosis not present

## 2023-04-23 DIAGNOSIS — N1832 Chronic kidney disease, stage 3b: Secondary | ICD-10-CM | POA: Diagnosis not present

## 2023-04-23 DIAGNOSIS — I131 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, with stage 1 through stage 4 chronic kidney disease, or unspecified chronic kidney disease: Secondary | ICD-10-CM | POA: Diagnosis not present

## 2023-04-23 MED ORDER — EMPAGLIFLOZIN 10 MG PO TABS: 10.0000 mg | ORAL_TABLET | Freq: Every day | ORAL | 2 refills | Status: DC

## 2023-04-23 NOTE — Progress Notes (Signed)
Subjective:   Sara Hancock is a 88 y.o. female who presents for Medicare Annual (Subsequent) preventive examination.  Visit Complete: In person  Patient Medicare AWV questionnaire was completed by the patient on 04/23/23; I have confirmed that all information answered by patient is correct and no changes since this date.    Cardiac Risk Factors include: advanced age (>50men, >48 women);diabetes mellitus;dyslipidemia;obesity (BMI >30kg/m2)     Objective:    Today's Vitals   04/23/23 1045  BP: 120/80  Pulse: 89  Resp: 16  Temp: (!) 97 F (36.1 C)  SpO2: 99%  Weight: 157 lb (71.2 kg)  Height: 5\' 2"  (1.575 m)   Body mass index is 28.72 kg/m.     04/23/2023   10:49 AM 01/08/2022    9:50 AM 05/09/2020    1:45 PM 11/27/2017   10:32 AM 05/18/2012    5:35 PM 05/18/2012   10:21 AM 05/10/2012   10:26 AM  Advanced Directives  Does Patient Have a Medical Advance Directive? 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  No - Patient declined       Would patient like information on creating a medical advance directive? No - Patient declined        Pre-existing out of facility DNR order (yellow form or pink MOST form)     No No     Current Medications (verified) Outpatient Encounter Medications as of 04/23/2023  Medication Sig   Accu-Chek FastClix Lancets MISC USE TO CHECK BLOOD SUGAR TWICE DAILY   allopurinol (ZYLOPRIM) 100 MG tablet Take 50 mg by mouth every other day.   amLODipine (NORVASC) 2.5 MG tablet TAKE 1 TABLET(2.5 MG) BY MOUTH DAILY   atorvastatin (LIPITOR) 40 MG tablet TAKE 1 TABLET(40 MG) BY MOUTH DAILY   BD PEN NEEDLE NANO 2ND GEN 32G X 4 MM MISC USE A NEEDLE EACH TIME FOR INSULIN INJECTION   calcium-vitamin  D (OSCAL WITH D) 250-125 MG-UNIT per tablet Take 1 tablet by mouth 2 (two) times daily.   Cholecalciferol (VITAMIN D3) 1000 units CAPS Take 1,000 Units by mouth daily.   ELIQUIS 2.5 MG TABS tablet TAKE 1 TABLET(2.5 MG) BY MOUTH TWICE DAILY.   empagliflozin (JARDIANCE) 10 MG TABS tablet Take 1 tablet (10 mg total) by mouth daily before breakfast.   fexofenadine (ALLEGRA ALLERGY) 60 MG tablet Take 1 tablet (60 mg total) by mouth 2 (two) times daily. (Patient taking differently: Take 60 mg by mouth daily.)   fluticasone (FLONASE) 50 MCG/ACT nasal spray Place 1 spray into both nostrils as needed.   folic acid (FOLVITE) 400 MCG tablet Take 400 mcg by mouth daily.   glucose blood (ACCU-CHEK GUIDE) test strip USE AS INSTRUCTED TWICE DAILY   insulin glargine (LANTUS SOLOSTAR) 100 UNIT/ML Solostar Pen Inject 54 units under the skin every morning and 36 units with Dinner   metolazone (ZAROXOLYN) 5 MG tablet Take 1 tablet (5 mg total) by mouth once a week.   metoprolol tartrate (LOPRESSOR) 100 MG tablet TAKE 1 TABLET(100 MG) BY MOUTH TWICE DAILY   Multiple Vitamin (MULTIVITAMIN WITH MINERALS) TABS Take 1 tablet by mouth daily.   nystatin cream (MYCOSTATIN) APPLY SUFFICIENT AMOUNT TOPICALLY TO THE AFFECTED AREA TWICE DAILY   pantoprazole (  PROTONIX) 40 MG tablet TAKE 1 TABLET BY MOUTH EVERY DAY   potassium chloride SA (KLOR-CON M) 20 MEQ tablet Take 1 tablet (20 mEq total) by mouth daily.   spironolactone (ALDACTONE) 25 MG tablet Take 25 mg by mouth daily.   torsemide (DEMADEX) 20 MG tablet TAKE 2 TABLETS BY MOUTH TWICE DAILY (Patient taking differently: 20 mg daily.)   ursodiol (ACTIGALL) 300 MG capsule TAKE 1 CAPSULE(300 MG) BY MOUTH TWICE DAILY   vitamin C (ASCORBIC ACID) 500 MG tablet Take 500 mg by mouth daily.   vitamin E 400 UNIT capsule Take 400 Units by mouth daily.   [DISCONTINUED] febuxostat (ULORIC) 40 MG tablet Take 1 tablet (40 mg total) by mouth daily. (Patient not taking: Reported on  04/23/2023)   [DISCONTINUED] montelukast (SINGULAIR) 10 MG tablet Take 1 tablet (10 mg total) by mouth at bedtime. (Patient not taking: Reported on 04/23/2023)   No facility-administered encounter medications on file as of 04/23/2023.    Allergies (verified) Ace inhibitors, Capoten [captopril], and Neomycin-bacitracin zn-polymyx   History: Past Medical History:  Diagnosis Date   Acquired thrombophilia (HCC) 09/11/2019   Allergic rhinitis 09/17/2015   Anemia    ANEMIA, NORMOCYTIC 10/27/2007   Qualifier: Diagnosis of  By: Koleen Distance CMA (AAMA), Leisha     Anticoagulant long-term use 01/08/2011   Arthritis    Benign neoplasm of colon 01/08/2011   Biliary cirrhosis (HCC)    BRADYCARDIA 11/15/2008   Qualifier: Diagnosis of  By: Wyn Quaker CMA, Amanda     Cellulitis of left foot 09/25/2016   Cholelithiasis 01/09/12   Chronic kidney disease, stage 3 (HCC)    Combined form of age-related cataract, both eyes 02/26/2017   Diabetes mellitus type 2 without retinopathy (HCC) 10/27/2013   Diverticulosis of colon with hemorrhage 06/08/2000   Dyslipidemia 09/17/2015   Encounter for therapeutic drug monitoring 06/09/2013   Esophageal reflux 01/08/2011   Esophageal stenosis 01/08/11   Esophagitis 01/08/11   GALLSTONES 10/26/2008   Qualifier: Diagnosis of  By: Jarold Motto MD Lang Snow    Gastritis, erosive chronic 01/08/2011   GERD (gastroesophageal reflux disease)    Glaucoma suspect of both eyes 02/26/2017   Glaucoma suspect of both eyes 02/26/2017   Hiatal hernia 01/08/11   History of IBS 10/27/2007   Qualifier: Diagnosis of  By: Koleen Distance CMA (AAMA), Hulan Saas     HTN (hypertension)    Hx of adenomatous colonic polyps 01/08/11   Hyperplastic colon polyp 06/08/2000, 07/31/2003, 01/08/11   Hypertension, essential 10/27/2007   Qualifier: Diagnosis of  By: Koleen Distance CMA (AAMA), Hulan Saas     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, with stage 1 through stage 4 chronic kidney disease, or unspecified  chronic kidney disease    Iron deficiency anemia 01/08/2011   Left foot pain 09/25/2016   Localized edema    Long term (current) use of insulin (HCC)    Mixed hyperlipidemia    Nuclear cataract 07/03/2011   Obesity    Obesity (BMI 30-39.9) 03/22/2012   Osteoarthritis of right thumb 09/17/2015   Osteopenia    Other iron deficiency anemias    Other specified menopausal and perimenopausal disorders    PAF (paroxysmal atrial fibrillation) (HCC)    occurring in the setting of bradycardia and mild first degree AV block followed by Dr. Clide Cliff   Pain in thoracic spine    Paraesophageal hiatal hernia, 3cm by EGD 03/22/2012   Pedal edema 12/13/2019   Persistent atrial fibrillation (HCC) 08/28/2019   Posterior vitreous  detachment, bilateral 01/10/2016   Primary biliary cholangitis (HCC) 10/27/2007   Qualifier: Diagnosis of  By: Koleen Distance CMA (AAMA), Hulan Saas     Pure hypercholesterolemia    Retinal tear, right 07/03/2011   Shortness of breath 12/13/2019   Stage 3b chronic kidney disease (HCC) 09/11/2019   Type II or unspecified type diabetes mellitus without mention of complication, not stated as uncontrolled    Uterine fibroid 01/09/12   Past Surgical History:  Procedure Laterality Date   BREAST CYST EXCISION     left   CARDIOVERSION N/A 11/27/2017   Procedure: CARDIOVERSION;  Surgeon: Thurmon Fair, MD;  Location: [facility];  Service: Cardiovascular;  Laterality: N/A;   COLONOSCOPY     INSERTION OF MESH  05/18/2012   Procedure: INSERTION OF MESH;  Surgeon: Ardeth Sportsman, MD;  Location: [facility];  Service: General;  Laterality: N/A;   LIVER BIOPSY  05/18/2012   Procedure: LIVER BIOPSY;  Surgeon: Ardeth Sportsman, MD;  Location: [facility];  Service: General;;   POLYPECTOMY     THYROIDECTOMY, PARTIAL  1980   TUBAL LIGATION  1972   VENTRAL HERNIA REPAIR  05/18/2012   Procedure: LAPAROSCOPIC VENTRAL HERNIA;  Surgeon: Ardeth Sportsman, MD;  Location: [facility];  Service: General;  Laterality: N/A;  Laparoscopic Ventral  Wall Hernia with Mesh   Family History  Problem Relation Age of Onset   Stroke Mother 29   Heart attack Father    Diabetes Sister        x 2   Aneurysm Sister    Stroke Sister    Congestive Heart Failure Sister    Diabetes Brother    Kidney failure Other    Hyperlipidemia Other    Arrhythmia Other    Congestive Heart Failure Other    Hypertension Other    Arthritis Other    Colon cancer Neg Hx    Esophageal cancer Neg Hx    Stomach cancer Neg Hx    Rectal cancer Neg Hx    Social History   Socioeconomic History   Marital status: Married    Spouse name: Not on file   Number of children: 2   Years of education: Not on file   Highest education level: Not on file  Occupational History   Occupation: retired    Associate Professor: RETIRED  Tobacco Use   Smoking status: Never   Smokeless tobacco: Never  Vaping Use   Vaping status: Never Used  Substance and Sexual Activity   Alcohol use: No   Drug use: No   Sexual activity: Not Currently  Other Topics Concern   Not on file  Social History Narrative   Not on file   Social Drivers of Health   Financial Resource Strain: Low Risk  (04/23/2023)   Overall Financial Resource Strain (CARDIA)    Difficulty of Paying Living Expenses: Not hard at all  Food Insecurity: No Food Insecurity (04/23/2023)   Hunger Vital Sign    Worried About Running Out of Food in the Last Year: Never true    Ran Out of Food in the Last Year: Never true  Transportation Needs: No Transportation Needs (04/23/2023)   PRAPARE - Administrator, Civil Service (Medical): No    Lack of Transportation (Non-Medical): No  Physical Activity: Insufficiently Active (04/23/2023)   Exercise Vital Sign    Days of Exercise per Week: 6 days    Minutes of Exercise per Session: 20 min  Stress: No Stress Concern Present (04/23/2023)  [employer] of Occupational Health - Occupational Stress Questionnaire    Feeling of Stress : Not at all  Social  Connections: Moderately Integrated (04/23/2023)   Social Connection and Isolation Panel [NHANES]    Frequency of Communication with Friends and Family: More than three times a week    Frequency of Social Gatherings with Friends and Family: Once a week    Attends Religious Services: More than 4 times per year    Active Member of [employer] or Organizations: Yes    Attends Banker Meetings: More than 4 times per year    Marital Status: Widowed    Tobacco Counseling Counseling given: Not Answered   Clinical Intake:  Pre-visit preparation completed: Yes  Pain : No/denies pain     BMI - recorded: 28.72 Nutritional Status: BMI 25 -29 Overweight Nutritional Risks: None Diabetes: Yes CBG done?: Yes CBG resulted in Enter/ Edit results?: Yes (91 mg/dl) Did pt. bring in CBG monitor from home?: No  How often do you need to have someone help you when you read instructions, pamphlets, or other written materials from your doctor or pharmacy?: 1 - Never What is the last grade level you completed in school?: some college  Interpreter Needed?: No      Activities of Daily Living    04/23/2023   10:50 AM  In your present state of health, do you have any difficulty performing the following activities:  Hearing? 0  Vision? 0  Difficulty concentrating or making decisions? 0  Walking or climbing stairs? 0  Dressing or bathing? 0  Doing errands, shopping? 0  Preparing Food and eating ? N  Using the Toilet? N  In the past six months, have you accidently leaked urine? N  Do you have problems with loss of bowel control? N  Managing your Medications? N  Managing your Finances? N  Housekeeping or managing your Housekeeping? N    Patient Care Team: Blane Ohara, MD as PCP - General (Internal Medicine) Thomasene Ripple, DO as PCP - Cardiology (Cardiology) Mardella Layman, MD as Consulting Physician (Gastroenterology) Duke Salvia, MD as Consulting Physician  (Cardiology) Meryl Dare, MD as Consulting Physician (Gastroenterology) Zettie Pho, [facility] (Inactive) as Pharmacist (Pharmacist) Darnell Level, MD as Consulting Physician (Internal Medicine) Zettie Pho, [facility] (Inactive) (Pharmacist)  Indicate any recent Medical Services you may have received from other than [facility] providers in the past year (date may be approximate).     Assessment:   This is a routine wellness examination for Sara Hancock.  Hearing/Vision screen No results found.   Goals Addressed             This Visit's Progress    Stay Active and Independent-Low Back Pain       Timeframe:  Long-Range Goal Priority:  Medium Start Date:                             Expected End Date:                       Follow Up Date 04/22/2024    - play my favorite music while exercising    Why is this important?   Regular activity or exercise is important to managing back pain.  Activity helps to keep your muscles strong.  You will sleep better and feel more relaxed.  You will have more energy and feel less stressed.  If  you are not active now, start slowly. Little changes make a big difference.  Rest, but not too much.  Stay as active as you can and listen to your body's signals.     Notes:       Depression Screen    04/23/2023   11:06 AM 12/25/2022   11:09 AM 05/22/2022   10:57 AM 01/08/2022    9:49 AM 05/14/2021    2:07 PM 03/28/2021   10:48 AM 12/20/2020   10:34 AM  PHQ 2/9 Scores  PHQ - 2 Score 0 0 0 0 0 0 0    Fall Risk    04/23/2023   10:50 AM 12/25/2022   11:09 AM 05/22/2022   10:57 AM 01/08/2022    9:51 AM 05/14/2021    2:07 PM  Fall Risk   Falls in the past year? 0 0 0 0 0  Number falls in past yr: 0 0 0 0 0  Injury with Fall? 0 0 0 0 0  Risk for fall due to : Impaired mobility Impaired mobility No Fall Risks No Fall Risks   Follow up Education provided Falls evaluation completed;Falls prevention discussed Falls evaluation completed Falls  evaluation completed;Falls prevention discussed;Education provided     MEDICARE RISK AT HOME: Medicare Risk at Home Any stairs in or around the home?: No If so, are there any without handrails?: No Home free of loose throw rugs in walkways, pet beds, electrical cords, etc?: No Adequate lighting in your home to reduce risk of falls?: No Life alert?: No Use of a cane, walker or w/c?: Yes (cane sometimes) Grab bars in the bathroom?: Yes Shower chair or bench in shower?: Yes Elevated toilet seat or a handicapped toilet?: No  TIMED UP AND GO:  Was the test performed?  Yes  Length of time to ambulate 10 feet: 8 sec Gait slow and steady without use of assistive device    Cognitive Function:        04/23/2023   10:52 AM  6CIT Screen  What Year? 0 points  What month? 0 points  What time? 0 points  Count back from 20 0 points  Months in reverse 0 points  Repeat phrase 0 points  Total Score 0 points    Immunizations Immunization History  Administered Date(s) Administered   Fluad Quad(high Dose 65+) 03/01/2020, 02/13/2022   Fluad Trivalent(High Dose 65+) 02/23/2023   Hep A / Hep B 05/03/2019, 05/10/2019, 08/08/2019, 05/17/2020   Influenza, High Dose Seasonal PF 03/14/2016   Influenza,inj,Quad PF,6+ Mos 02/15/2021   Influenza-Unspecified 03/14/2016, 01/10/2018, 03/28/2021   PFIZER Comirnaty(Gray Top)Covid-19 Tri-Sucrose Vaccine 05/28/2019, 06/18/2019, 02/09/2020, 10/17/2020   PFIZER(Purple Top)SARS-COV-2 Vaccination 05/28/2019, 06/18/2019, 02/09/2020   Pfizer Covid-19 Vaccine Bivalent Booster 57yrs & up 02/04/2021, 09/30/2021   Pfizer(Comirnaty)Fall Seasonal Vaccine 12 years and older 02/26/2022, 03/12/2023   Pneumococcal Conjugate-13 10/24/2013   Pneumococcal Polysaccharide-23 05/12/2012, 05/20/2012   Td 05/12/2012   Tdap 03/31/2018   Zoster Recombinant(Shingrix) 02/14/2018, 05/10/2018   Zoster, Live 09/08/2013    TDAP status: Up to date  Flu Vaccine status: Up to  date  Pneumococcal vaccine status: Up to date  Covid-19 vaccine status: Completed vaccines  Screening Tests Health Maintenance  Topic Date Due   COVID-19 Vaccine (12 - 2024-25 season) 05/07/2023   MAMMOGRAM  06/10/2023   OPHTHALMOLOGY EXAM  07/10/2023   HEMOGLOBIN A1C  10/19/2023   FOOT EXAM  12/25/2023   Medicare Annual Wellness (AWV)  04/25/2024   DTaP/Tdap/Td (3 - Td or Tdap) 03/31/2028  Pneumonia Vaccine 19+ Years old  Completed   INFLUENZA VACCINE  Completed   DEXA SCAN  Completed   Zoster Vaccines- Shingrix  Completed   HPV VACCINES  Aged Out    Health Maintenance  Health Maintenance Due  Topic Date Due   COVID-19 Vaccine (12 - 2024-25 season) 05/07/2023    Vision Screening: Recommended annual ophthalmology exams for early detection of glaucoma and other disorders of the eye. Is the patient up to date with their annual eye exam?  Yes  Who is the provider or what is the name of the office in which the patient attends annual eye exams? Dr. Gwendalyn Ege [facility] in [place].) Dental Screening: Recommended annual dental exams for proper oral hygiene  Diabetic Foot Exam: Diabetic Foot Exam: Completed 04/23/2023  Community Resource Referral / Chronic Care Management: CRR required this visit?  No   CCM required this visit?  No     Plan:    Encounter for Medicare annual wellness exam Assessment & Plan: Education given.  Recommend work on Merchant navy officer.      I have personally reviewed and noted the following in the patient's chart:   Medical and social history Use of alcohol, tobacco or illicit drugs  Current medications and supplements including opioid prescriptions. Patient is not currently taking opioid prescriptions. Functional ability and status Nutritional status Physical activity Advanced directives List of other physicians Hospitalizations, surgeries, and ER visits in previous 12 months Vitals Screenings to include cognitive,  depression, and falls Referrals and appointments  In addition, I have reviewed and discussed with patient certain preventive protocols, quality metrics, and best practice recommendations. A written personalized care plan for preventive services as well as general preventive health recommendations were provided to patient.     Blane Ohara, MD   05/03/2023

## 2023-04-23 NOTE — Assessment & Plan Note (Signed)
Started/Given samples of Jardiance 10 mg CGM dexcom supply order sent via [facility]. Patient aware she must answer phone call from them in order to receive supplies.

## 2023-04-23 NOTE — Patient Instructions (Addendum)
VISIT SUMMARY:  Today, we reviewed your overall health, focusing on your diabetes, primary biliary cirrhosis, and gout. Your blood sugar levels have been stable, but your recent hemoglobin A1c test indicates that your diabetes is not well-controlled. We discussed your current medications and made some adjustments to better manage your conditions. You are doing well with your exercise and social activities, and you have no new symptoms or concerns.  YOUR PLAN:  -DIABETES MELLITUS: Diabetes Mellitus is a condition where your blood sugar levels are too high. Your recent hemoglobin A1c test result was 8.8, which means your diabetes is not well-controlled. We will start continuous glucose monitoring to better understand your glucose trends and add Jardiance 10mg  daily to your current insulin regimen. We will check your A1c again in 3 months.  -PRIMARY BILIARY CIRRHOSIS: Primary Biliary Cirrhosis is a liver disease where the bile ducts in your liver are slowly destroyed. Your condition is stable on Ursodiol. Since your current gastroenterologist is retiring, you will continue your care with Dr. Marina Goodell. Please continue taking Ursodiol as prescribed and follow up with Dr. Marina Goodell as scheduled.  -HEALTHCARE POWER OF ATTORNEY AND LIVING WILL: You currently do not have a healthcare power of attorney or living will, and you prefer to maintain autonomy over your healthcare decisions. It is recommended to consider formalizing these documents in the future to ensure your wishes are respected.  -GENERAL HEALTH MAINTENANCE: Continue with your annual eye exams and regular dental visits. We performed a diabetic foot exam today and collected a urine sample for routine monitoring. Please continue your current medication regimen, except for the changes made to your diabetes management.  INSTRUCTIONS:  Please follow up in 3 months for a repeat hemoglobin A1c test. Continue with your scheduled appointments with Dr. Marina Goodell for  your primary biliary cirrhosis. Consider formalizing a healthcare power of attorney and living will in the future. Start on jardiance 10 mg daily. Continue current doses of insulin.  Start on dexcom G7.  Taught how to use while here.

## 2023-04-23 NOTE — Progress Notes (Signed)
Subjective:  Patient ID: Sara Hancock, female    DOB: January 09, 1935  Age: 88 y.o. MRN: [id number]  Chief Complaint  Patient presents with   Annual Wellness Visit    HPI The patient, an 88 year old individual with a history of diabetes, primary biliary cirrhosis, and gout, presents for a routine follow-up visit. She reports no new symptoms or concerns since her last visit. She has been managing her diabetes with insulin, adjusting the dosage as needed based on her blood sugar readings. She reports that her blood sugar levels have been relatively stable, with morning readings around 90-100 and evening readings around 130-160. However, her recent hemoglobin A1c test result was 8.8, which is higher than the previous result of 8.5, indicating that her diabetes is not well-controlled.  The patient's gout has been managed with allopurinol, taken at a reduced dose due to her kidney dysfunction. She reports no recent gout flare-ups.   Her primary biliary cirrhosis is managed with ursodiol. She has been seeing a gastroenterologist, Dr. Russella Dar, for this condition, but he is retiring, and she plans to switch to Dr. Marina Goodell.  The patient reports regular exercise and social activities. She has no signs of depression, no recent falls, and her memory is good. She has received her COVID-19 vaccine and sees her eye doctor and dentist regularly. She reports no fevers, chills, sweats, earaches, sore throat, stuffy nose, chest pain, breathing problems, abdominal pain, bowel problems, or bladder issues. She has not established a healthcare power of attorney or a living will, preferring to remain in charge of her own healthcare decisions for the time being.    04/23/2023   11:06 AM 12/25/2022   11:09 AM 05/22/2022   10:57 AM 01/08/2022    9:49 AM 05/14/2021    2:07 PM  Depression screen PHQ 2/9  Decreased Interest 0 0 0 0 0  Down, Depressed, Hopeless 0 0 0 0 0  PHQ - 2 Score 0 0 0 0 0        04/23/2023   10:50  AM  Fall Risk   Falls in the past year? 0  Number falls in past yr: 0  Injury with Fall? 0  Risk for fall due to : Impaired mobility  Follow up Education provided    Patient Care Team: Blane Ohara, MD as PCP - General (Internal Medicine) Thomasene Ripple, DO as PCP - Cardiology (Cardiology) Mardella Layman, MD as Consulting Physician (Gastroenterology) Duke Salvia, MD as Consulting Physician (Cardiology) Meryl Dare, MD as Consulting Physician (Gastroenterology) Zettie Pho, [facility] (Inactive) as Pharmacist (Pharmacist) Darnell Level, MD as Consulting Physician (Internal Medicine) Zettie Pho, [facility] (Inactive) (Pharmacist)   Review of Systems  Constitutional:  Negative for chills, fatigue and fever.  HENT:  Negative for congestion, ear pain and sore throat.   Respiratory:  Negative for cough and shortness of breath.   Cardiovascular:  Negative for chest pain.  Gastrointestinal:  Negative for abdominal pain, constipation, diarrhea, nausea and vomiting.  Genitourinary:  Negative for dysuria and urgency.  Musculoskeletal:  Negative for arthralgias and myalgias.  Skin:  Negative for rash.  Neurological:  Negative for dizziness and headaches.  Psychiatric/Behavioral:  Negative for dysphoric mood. The patient is not nervous/anxious.     Current Outpatient Medications on File Prior to Visit  Medication Sig Dispense Refill   Accu-Chek FastClix Lancets MISC USE TO CHECK BLOOD SUGAR TWICE DAILY 200 each 0   allopurinol (ZYLOPRIM) 100 MG tablet Take 50 mg  by mouth every other day.     amLODipine (NORVASC) 2.5 MG tablet TAKE 1 TABLET(2.5 MG) BY MOUTH DAILY 90 tablet 1   atorvastatin (LIPITOR) 40 MG tablet TAKE 1 TABLET(40 MG) BY MOUTH DAILY 90 tablet 0   BD PEN NEEDLE NANO 2ND GEN 32G X 4 MM MISC USE A NEEDLE EACH TIME FOR INSULIN INJECTION 200 each 1   calcium-vitamin D (OSCAL WITH D) 250-125 MG-UNIT per tablet Take 1 tablet by mouth 2 (two) times daily.      Cholecalciferol (VITAMIN D3) 1000 units CAPS Take 1,000 Units by mouth daily.     ELIQUIS 2.5 MG TABS tablet TAKE 1 TABLET(2.5 MG) BY MOUTH TWICE DAILY. 180 tablet 1   fexofenadine (ALLEGRA ALLERGY) 60 MG tablet Take 1 tablet (60 mg total) by mouth 2 (two) times daily. (Patient taking differently: Take 60 mg by mouth daily.) 180 tablet 1   fluticasone (FLONASE) 50 MCG/ACT nasal spray Place 1 spray into both nostrils as needed.     folic acid (FOLVITE) 400 MCG tablet Take 400 mcg by mouth daily.     glucose blood (ACCU-CHEK GUIDE) test strip USE AS INSTRUCTED TWICE DAILY 200 strip 3   insulin glargine (LANTUS SOLOSTAR) 100 UNIT/ML Solostar Pen Inject 54 units under the skin every morning and 36 units with Dinner 15 mL PRN   metolazone (ZAROXOLYN) 5 MG tablet Take 1 tablet (5 mg total) by mouth once a week. 12 tablet 3   metoprolol tartrate (LOPRESSOR) 100 MG tablet TAKE 1 TABLET(100 MG) BY MOUTH TWICE DAILY 180 tablet 0   Multiple Vitamin (MULTIVITAMIN WITH MINERALS) TABS Take 1 tablet by mouth daily.     nystatin cream (MYCOSTATIN) APPLY SUFFICIENT AMOUNT TOPICALLY TO THE AFFECTED AREA TWICE DAILY 30 g 1   pantoprazole (PROTONIX) 40 MG tablet TAKE 1 TABLET BY MOUTH EVERY DAY 90 tablet 1   potassium chloride SA (KLOR-CON M) 20 MEQ tablet Take 1 tablet (20 mEq total) by mouth daily. 90 tablet 0   spironolactone (ALDACTONE) 25 MG tablet Take 25 mg by mouth daily.     torsemide (DEMADEX) 20 MG tablet TAKE 2 TABLETS BY MOUTH TWICE DAILY (Patient taking differently: 20 mg daily.) 360 tablet 0   ursodiol (ACTIGALL) 300 MG capsule TAKE 1 CAPSULE(300 MG) BY MOUTH TWICE DAILY 180 capsule 3   vitamin C (ASCORBIC ACID) 500 MG tablet Take 500 mg by mouth daily.     vitamin E 400 UNIT capsule Take 400 Units by mouth daily.     No current facility-administered medications on file prior to visit.   Past Medical History:  Diagnosis Date   Acquired thrombophilia (HCC) 09/11/2019   Allergic rhinitis 09/17/2015    Anemia    ANEMIA, NORMOCYTIC 10/27/2007   Qualifier: Diagnosis of  By: Koleen Distance CMA (AAMA), Leisha     Anticoagulant long-term use 01/08/2011   Arthritis    Benign neoplasm of colon 01/08/2011   Biliary cirrhosis (HCC)    BRADYCARDIA 11/15/2008   Qualifier: Diagnosis of  By: Wyn Quaker CMA, Amanda     Cellulitis of left foot 09/25/2016   Cholelithiasis 01/09/12   Chronic kidney disease, stage 3 (HCC)    Combined form of age-related cataract, both eyes 02/26/2017   Diabetes mellitus type 2 without retinopathy (HCC) 10/27/2013   Diverticulosis of colon with hemorrhage 06/08/2000   Dyslipidemia 09/17/2015   Encounter for therapeutic drug monitoring 06/09/2013   Esophageal reflux 01/08/2011   Esophageal stenosis 01/08/11   Esophagitis 01/08/11   GALLSTONES  10/26/2008   Qualifier: Diagnosis of  By: Jarold Motto MD Lang Snow    Gastritis, erosive chronic 01/08/2011   GERD (gastroesophageal reflux disease)    Glaucoma suspect of both eyes 02/26/2017   Glaucoma suspect of both eyes 02/26/2017   Hiatal hernia 01/08/11   History of IBS 10/27/2007   Qualifier: Diagnosis of  By: Koleen Distance CMA (AAMA), Hulan Saas     HTN (hypertension)    Hx of adenomatous colonic polyps 01/08/11   Hyperplastic colon polyp 06/08/2000, 07/31/2003, 01/08/11   Hypertension, essential 10/27/2007   Qualifier: Diagnosis of  By: Koleen Distance CMA Duncan Dull), Hulan Saas     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, with stage 1 through stage 4 chronic kidney disease, or unspecified chronic kidney disease    Iron deficiency anemia 01/08/2011   Left foot pain 09/25/2016   Localized edema    Long term (current) use of insulin (HCC)    Mixed hyperlipidemia    Nuclear cataract 07/03/2011   Obesity    Obesity (BMI 30-39.9) 03/22/2012   Osteoarthritis of right thumb 09/17/2015   Osteopenia    Other iron deficiency anemias    Other specified menopausal and perimenopausal disorders    PAF (paroxysmal atrial fibrillation) (HCC)    occurring in  the setting of bradycardia and mild first degree AV block followed by Dr. Clide Cliff   Pain in thoracic spine    Paraesophageal hiatal hernia, 3cm by EGD 03/22/2012   Pedal edema 12/13/2019   Persistent atrial fibrillation (HCC) 08/28/2019   Posterior vitreous detachment, bilateral 01/10/2016   Primary biliary cholangitis (HCC) 10/27/2007   Qualifier: Diagnosis of  By: Koleen Distance CMA (AAMA), Hulan Saas     Pure hypercholesterolemia    Retinal tear, right 07/03/2011   Shortness of breath 12/13/2019   Stage 3b chronic kidney disease (HCC) 09/11/2019   Type II or unspecified type diabetes mellitus without mention of complication, not stated as uncontrolled    Uterine fibroid 01/09/12   Past Surgical History:  Procedure Laterality Date   BREAST CYST EXCISION     left   CARDIOVERSION N/A 11/27/2017   Procedure: CARDIOVERSION;  Surgeon: Thurmon Fair, MD;  Location: [facility];  Service: Cardiovascular;  Laterality: N/A;   COLONOSCOPY     INSERTION OF MESH  05/18/2012   Procedure: INSERTION OF MESH;  Surgeon: Ardeth Sportsman, MD;  Location: [facility];  Service: General;  Laterality: N/A;   LIVER BIOPSY  05/18/2012   Procedure: LIVER BIOPSY;  Surgeon: Ardeth Sportsman, MD;  Location: [facility];  Service: General;;   POLYPECTOMY     THYROIDECTOMY, PARTIAL  1980   TUBAL LIGATION  1972   VENTRAL HERNIA REPAIR  05/18/2012   Procedure: LAPAROSCOPIC VENTRAL HERNIA;  Surgeon: Ardeth Sportsman, MD;  Location: [facility];  Service: General;  Laterality: N/A;  Laparoscopic Ventral Wall Hernia with Mesh    Family History  Problem Relation Age of Onset   Stroke Mother 49   Heart attack Father    Diabetes Sister        x 2   Aneurysm Sister    Stroke Sister    Congestive Heart Failure Sister    Diabetes Brother    Kidney failure Other    Hyperlipidemia Other    Arrhythmia Other    Congestive Heart Failure Other    Hypertension Other    Arthritis Other    Colon cancer Neg Hx    Esophageal cancer Neg Hx    Stomach cancer Neg  Hx    Rectal cancer Neg Hx    Social History   Socioeconomic History   Marital status: Married    Spouse name: Not on file   Number of children: 2   Years of education: Not on file   Highest education level: Not on file  Occupational History   Occupation: retired    Associate Professor: RETIRED  Tobacco Use   Smoking status: Never   Smokeless tobacco: Never  Vaping Use   Vaping status: Never Used  Substance and Sexual Activity   Alcohol use: No   Drug use: No   Sexual activity: Not Currently  Other Topics Concern   Not on file  Social History Narrative   Not on file   Social Drivers of Health   Financial Resource Strain: Low Risk  (04/23/2023)   Overall Financial Resource Strain (CARDIA)    Difficulty of Paying Living Expenses: Not hard at all  Food Insecurity: No Food Insecurity (04/23/2023)   Hunger Vital Sign    Worried About Running Out of Food in the Last Year: Never true    Ran Out of Food in the Last Year: Never true  Transportation Needs: No Transportation Needs (04/23/2023)   PRAPARE - Administrator, Civil Service (Medical): No    Lack of Transportation (Non-Medical): No  Physical Activity: Insufficiently Active (04/23/2023)   Exercise Vital Sign    Days of Exercise per Week: 6 days    Minutes of Exercise per Session: 20 min  Stress: No Stress Concern Present (04/23/2023)   [employer] of Occupational Health - Occupational Stress Questionnaire    Feeling of Stress : Not at all  Social Connections: Moderately Integrated (04/23/2023)   Social Connection and Isolation Panel [NHANES]    Frequency of Communication with Friends and Family: More than three times a week    Frequency of Social Gatherings with Friends and Family: Once a week    Attends Religious Services: More than 4 times per year    Active Member of [employer] or Organizations: Yes    Attends Banker Meetings: More than 4 times per year    Marital Status: Widowed    Objective:   BP 120/80 (Cuff Size: Normal)   Pulse 89   Temp (!) 97 F (36.1 C)   Resp 16   Ht 5\' 2"  (1.575 m)   Wt 157 lb (71.2 kg)   SpO2 99%   BMI 28.72 kg/m      04/23/2023   10:45 AM 04/21/2023   10:06 AM 03/04/2023   11:19 AM  BP/Weight  Systolic BP 120 104 121  Diastolic BP 80 70 76  Wt. (Lbs) 157 156 158.2  BMI 28.72 kg/m2 28.53 kg/m2 28.94 kg/m2    Physical Exam Vitals reviewed.  Constitutional:      Appearance: Normal appearance. She is normal weight.  Neck:     Vascular: No carotid bruit.  Cardiovascular:     Rate and Rhythm: Normal rate and regular rhythm.     Heart sounds: Normal heart sounds.  Pulmonary:     Effort: Pulmonary effort is normal. No respiratory distress.     Breath sounds: Normal breath sounds.  Abdominal:     General: Abdomen is flat. Bowel sounds are normal.     Palpations: Abdomen is soft.     Tenderness: There is no abdominal tenderness.  Neurological:     Mental Status: She is alert and oriented to person, place, and time.  Psychiatric:  Mood and Affect: Mood normal.        Behavior: Behavior normal.     Diabetic Foot Exam - Simple   Simple Foot Form  04/23/2023  4:12 PM  Visual Inspection No deformities, no ulcerations, no other skin breakdown bilaterally: Yes Sensation Testing Intact to touch and monofilament testing bilaterally: Yes Pulse Check Posterior Tibialis and Dorsalis pulse intact bilaterally: Yes Comments      Lab Results  Component Value Date   WBC 8.7 04/20/2023   HGB 11.7 04/20/2023   HCT 36.2 04/20/2023   PLT 191 04/20/2023   GLUCOSE 152 (H) 04/20/2023   CHOL 112 04/20/2023   TRIG 96 04/20/2023   HDL 46 04/20/2023   LDLCALC 48 04/20/2023   ALT 13 04/20/2023   AST 25 04/20/2023   NA 142 04/20/2023   K 3.8 04/20/2023   CL 96 04/20/2023   CREATININE 2.51 (H) 04/20/2023   BUN 94 (HH) 04/20/2023   CO2 25 04/20/2023   TSH 2.020 09/10/2020   INR 1.2 (H) 02/24/2022   HGBA1C 8.8 (H) 04/20/2023       Assessment & Plan:   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  No changes to medicines. Lopressor 100 mg twice daily, spironolactone 25 mg daily, torsemide 40 mg twice daily.  Continue to work on eating a healthy diet and exercise.  Labs reviewed today.     Mixed hyperlipidemia Assessment & Plan: Well controlled.  No changes to medicines. Continue atorvastatin 40 mg before bed.  Continue to work on eating a healthy diet and exercise.  Labs reviewed.    Type 2 diabetes mellitus with stage 3B chronic kidney disease, with long-term current use of insulin (HCC) Assessment & Plan:  Not at goal. Started/Given samples of Jardiance 10 mg daily. Continue other medications. CGM dexcom supply order sent via [facility]. Patient aware she must answer phone call from them in order to receive supplies.  Orders: -     Empagliflozin; Take 1 tablet (10 mg total) by mouth daily before breakfast.  Dispense: 30 tablet; Refill: 2  Osteoporosis, post-menopausal Assessment & Plan: Recommend Evenity for osteoporosis.   Primary biliary cholangitis (HCC) Assessment & Plan: Continue ursodiol. Management per specialist.    Follow-up: Return in 1 year (on 04/22/2024). An After Visit Summary was printed and given to the patient.  Blane Ohara, MD [facility] [phone]

## 2023-04-26 DIAGNOSIS — Z0000 Encounter for general adult medical examination without abnormal findings: Secondary | ICD-10-CM | POA: Insufficient documentation

## 2023-05-03 NOTE — Assessment & Plan Note (Signed)
Recommend Evenity for osteoporosis.

## 2023-05-03 NOTE — Assessment & Plan Note (Signed)
Education given.  Recommend work on [employer]chant [employer] officer.

## 2023-05-03 NOTE — Assessment & Plan Note (Signed)
Well controlled.  No changes to medicines. Continue atorvastatin 40 mg before bed.  Continue to work on eating a healthy diet and exercise.  Labs reviewed.

## 2023-05-03 NOTE — Assessment & Plan Note (Signed)
Well controlled.  No changes to medicines. Lopressor 100 mg twice daily, spironolactone 25 mg daily, torsemide 40 mg twice daily.  Continue to work on eating a healthy diet and exercise.  Labs reviewed today.

## 2023-05-03 NOTE — Assessment & Plan Note (Signed)
Continue ursodiol. Management per specialist.   

## 2023-05-15 ENCOUNTER — Ambulatory Visit: Payer: Medicare Other | Admitting: Family Medicine

## 2023-05-16 ENCOUNTER — Other Ambulatory Visit: Payer: Self-pay | Admitting: Family Medicine

## 2023-05-16 DIAGNOSIS — E1122 Type 2 diabetes mellitus with diabetic chronic kidney disease: Secondary | ICD-10-CM

## 2023-05-18 ENCOUNTER — Ambulatory Visit: Payer: Medicare Other | Admitting: Family Medicine

## 2023-05-18 ENCOUNTER — Other Ambulatory Visit: Payer: Self-pay

## 2023-05-18 DIAGNOSIS — N1832 Chronic kidney disease, stage 3b: Secondary | ICD-10-CM

## 2023-05-18 MED ORDER — EMPAGLIFLOZIN 10 MG PO TABS: 10.0000 mg | ORAL_TABLET | Freq: Every day | ORAL | 0 refills | Status: DC

## 2023-05-18 NOTE — Telephone Encounter (Signed)
 Copied from CRM [id number]. Topic: Clinical - Prescription Issue >> May 14, 2023  4:52 PM Delon T wrote: Reason for CRM: empagliflozin  (JARDIANCE ) 10 MG TABS tablet was called in as a 30 day supply but she gets all her meds as 90 day supply, please call to correct this  patient number [phone]

## 2023-05-21 ENCOUNTER — Other Ambulatory Visit: Payer: Self-pay

## 2023-05-21 ENCOUNTER — Other Ambulatory Visit: Payer: Self-pay | Admitting: Family Medicine

## 2023-05-21 DIAGNOSIS — Z1231 Encounter for screening mammogram for malignant neoplasm of breast: Secondary | ICD-10-CM

## 2023-05-26 ENCOUNTER — Ambulatory Visit: Payer: Self-pay | Admitting: Family Medicine

## 2023-05-26 ENCOUNTER — Encounter: Payer: Self-pay | Admitting: Family Medicine

## 2023-05-26 ENCOUNTER — Ambulatory Visit (INDEPENDENT_AMBULATORY_CARE_PROVIDER_SITE_OTHER): Payer: Medicare Other | Admitting: Family Medicine

## 2023-05-26 VITALS — BP 102/62 | HR 76 | Temp 97.3°F | Ht 62.0 in | Wt 153.4 lb

## 2023-05-26 DIAGNOSIS — E1122 Type 2 diabetes mellitus with diabetic chronic kidney disease: Secondary | ICD-10-CM

## 2023-05-26 DIAGNOSIS — Z794 Long term (current) use of insulin: Secondary | ICD-10-CM | POA: Diagnosis not present

## 2023-05-26 DIAGNOSIS — N1832 Chronic kidney disease, stage 3b: Secondary | ICD-10-CM

## 2023-05-26 NOTE — Telephone Encounter (Signed)
 Patient advised that [facility] [phone] is requesting for a prior authorization for the medication BD PEN NEEDLE NANO 2ND GEN 32G X 4 MM MISC. Please assist for the patient.       Copied from CRM [id number]. Topic: Clinical - Prescription Issue >> May 26, 2023  2:17 PM Chase C wrote: Reason for CRM: Patient advised that [facility] [phone] is requesting for a prior authorization for the medication BD PEN NEEDLE NANO 2ND GEN 32G X 4 MM MISC. Please assist for the patient.

## 2023-05-26 NOTE — Progress Notes (Signed)
 Subjective:  Patient ID: Sara Hancock, female    DOB: 12-Apr-1935  Age: 89 y.o. MRN: [id number]  Chief Complaint  Patient presents with   Diabetes    HPI: Diabetes: Patient wanted to come in to discuss the fact that she can't use the Dexcom 6 that she was given and that it was just too much for her and she would like to return back to just checking her blood sugar manually by pricking her finger.  Patient was unable to wear the Dexcom because the adhesive broke around a very painful rash. Patient started Jardiance  10 mg daily and is tolerating this.  In addition she is currently on Lantus  54 units in the morning and 36 units at dinner.      04/23/2023   11:06 AM 12/25/2022   11:09 AM 05/22/2022   10:57 AM 01/08/2022    9:49 AM 05/14/2021    2:07 PM  Depression screen PHQ 2/9  Decreased Interest 0 0 0 0 0  Down, Depressed, Hopeless 0 0 0 0 0  PHQ - 2 Score 0 0 0 0 0        04/23/2023   10:50 AM  Fall Risk   Falls in the past year? 0  Number falls in past yr: 0  Injury with Fall? 0  Risk for fall due to : Impaired mobility  Follow up Education provided    Patient Care Team: Sherre Clapper, MD as PCP - General (Internal Medicine) Sheena Pugh, DO as PCP - Cardiology (Cardiology) Jakie Alm SAUNDERS, MD as Consulting Physician (Gastroenterology) Fernande Elspeth BROCKS, MD as Consulting Physician (Cardiology) Aneita Gwendlyn DASEN, MD (Inactive) as Consulting Physician (Gastroenterology) Nyle Rankin POUR, [facility] (Inactive) as Pharmacist (Pharmacist) Macel Jayson PARAS, MD as Consulting Physician (Internal Medicine) Nyle Rankin POUR, [facility] (Inactive) (Pharmacist)   Review of Systems  Constitutional:  Negative for appetite change, fatigue and fever.  HENT:  Negative for congestion, ear pain, sinus pressure and sore throat.   Respiratory:  Negative for cough, chest tightness, shortness of breath and wheezing.   Cardiovascular:  Negative for chest pain and palpitations.  Gastrointestinal:   Negative for abdominal pain, constipation, diarrhea, nausea and vomiting.  Genitourinary:  Negative for dysuria and hematuria.  Musculoskeletal:  Negative for arthralgias, back pain, joint swelling and myalgias.  Skin:  Positive for rash.       Painful rash at dexcom sight  Neurological:  Negative for dizziness, weakness and headaches.  Psychiatric/Behavioral:  Negative for dysphoric mood. The patient is not nervous/anxious.     Current Outpatient Medications on File Prior to Visit  Medication Sig Dispense Refill   Accu-Chek FastClix Lancets MISC USE TO CHECK BLOOD SUGAR TWICE DAILY 204 each 1   allopurinol  (ZYLOPRIM ) 100 MG tablet TAKE ONE-HALF TABLET BY MOUTH EVERY OTHER DAY 22 tablet 2   amLODipine  (NORVASC ) 2.5 MG tablet TAKE 1 TABLET(2.5 MG) BY MOUTH DAILY 90 tablet 1   atorvastatin  (LIPITOR) 40 MG tablet TAKE 1 TABLET(40 MG) BY MOUTH DAILY 90 tablet 0   BD PEN NEEDLE NANO 2ND GEN 32G X 4 MM MISC USE A NEEDLE EACH TIME FOR INSULIN  INJECTION 200 each 1   calcium -vitamin D (OSCAL WITH D) 250-125 MG-UNIT per tablet Take 1 tablet by mouth 2 (two) times daily.     Cholecalciferol (VITAMIN D3) 1000 units CAPS Take 1,000 Units by mouth daily.     ELIQUIS  2.5 MG TABS tablet TAKE 1 TABLET(2.5 MG) BY MOUTH TWICE DAILY. 180 tablet 1  empagliflozin  (JARDIANCE ) 10 MG TABS tablet Take 1 tablet (10 mg total) by mouth daily before breakfast. 90 tablet 0   fexofenadine  (ALLEGRA  ALLERGY) 60 MG tablet Take 1 tablet (60 mg total) by mouth 2 (two) times daily. (Patient taking differently: Take 60 mg by mouth daily.) 180 tablet 1   fluticasone (FLONASE) 50 MCG/ACT nasal spray Place 1 spray into both nostrils as needed.     folic acid  (FOLVITE ) 400 MCG tablet Take 400 mcg by mouth daily.     glucose blood (ACCU-CHEK GUIDE) test strip USE AS INSTRUCTED TWICE DAILY 200 strip 3   insulin  glargine (LANTUS  SOLOSTAR) 100 UNIT/ML Solostar Pen Inject 54 units under the skin every morning and 36 units with Dinner 15  mL PRN   metolazone  (ZAROXOLYN ) 5 MG tablet Take 1 tablet (5 mg total) by mouth once a week. 12 tablet 3   metoprolol  tartrate (LOPRESSOR ) 100 MG tablet TAKE 1 TABLET(100 MG) BY MOUTH TWICE DAILY 180 tablet 0   Multiple Vitamin (MULTIVITAMIN WITH MINERALS) TABS Take 1 tablet by mouth daily.     nystatin cream (MYCOSTATIN) APPLY SUFFICIENT AMOUNT TOPICALLY TO THE AFFECTED AREA TWICE DAILY 30 g 1   pantoprazole  (PROTONIX ) 40 MG tablet TAKE 1 TABLET BY MOUTH EVERY DAY 90 tablet 1   potassium chloride  SA (KLOR-CON  M) 20 MEQ tablet Take 1 tablet (20 mEq total) by mouth daily. 90 tablet 0   spironolactone (ALDACTONE) 25 MG tablet Take 25 mg by mouth daily.     torsemide  (DEMADEX ) 20 MG tablet TAKE 2 TABLETS BY MOUTH TWICE DAILY (Patient taking differently: 20 mg daily.) 360 tablet 0   ursodiol  (ACTIGALL ) 300 MG capsule TAKE 1 CAPSULE(300 MG) BY MOUTH TWICE DAILY 180 capsule 3   vitamin C  (ASCORBIC ACID ) 500 MG tablet Take 500 mg by mouth daily.     vitamin E  400 UNIT capsule Take 400 Units by mouth daily.     No current facility-administered medications on file prior to visit.   Past Medical History:  Diagnosis Date   Acquired thrombophilia (HCC) 09/11/2019   Allergic rhinitis 09/17/2015   Anemia    ANEMIA, NORMOCYTIC 10/27/2007   Qualifier: Diagnosis of  By: Kowalk CMA (AAMA), Leisha     Anticoagulant long-term use 01/08/2011   Arthritis    Benign neoplasm of colon 01/08/2011   Biliary cirrhosis (HCC)    BRADYCARDIA 11/15/2008   Qualifier: Diagnosis of  By: Rubbie CMA, Amanda     Cellulitis of left foot 09/25/2016   Cholelithiasis 01/09/12   Chronic kidney disease, stage 3 (HCC)    Combined form of age-related cataract, both eyes 02/26/2017   Diabetes mellitus type 2 without retinopathy (HCC) 10/27/2013   Diverticulosis of colon with hemorrhage 06/08/2000   Dyslipidemia 09/17/2015   Encounter for therapeutic drug monitoring 06/09/2013   Esophageal reflux 01/08/2011   Esophageal stenosis 01/08/11    Esophagitis 01/08/11   GALLSTONES 10/26/2008   Qualifier: Diagnosis of  By: Jakie MD NOLIA Alm SAUNDERS    Gastritis, erosive chronic 01/08/2011   GERD (gastroesophageal reflux disease)    Glaucoma suspect of both eyes 02/26/2017   Glaucoma suspect of both eyes 02/26/2017   Hiatal hernia 01/08/11   History of IBS 10/27/2007   Qualifier: Diagnosis of  By: Kowalk CMA (AAMA), Leisha     HTN (hypertension)    Hx of adenomatous colonic polyps 01/08/11   Hyperplastic colon polyp 06/08/2000, 07/31/2003, 01/08/11   Hypertension, essential 10/27/2007   Qualifier: Diagnosis of  By: Genie  CMA (AAMA), Leisha     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, with stage 1 through stage 4 chronic kidney disease, or unspecified chronic kidney disease    Iron  deficiency anemia 01/08/2011   Left foot pain 09/25/2016   Localized edema    Long term (current) use of insulin  (HCC)    Mixed hyperlipidemia    Nuclear cataract 07/03/2011   Obesity    Obesity (BMI 30-39.9) 03/22/2012   Osteoarthritis of right thumb 09/17/2015   Osteopenia    Other iron  deficiency anemias    Other specified menopausal and perimenopausal disorders    PAF (paroxysmal atrial fibrillation) (HCC)    occurring in the setting of bradycardia and mild first degree AV block followed by Dr. Beverli   Pain in thoracic spine    Paraesophageal hiatal hernia, 3cm by EGD 03/22/2012   Pedal edema 12/13/2019   Persistent atrial fibrillation (HCC) 08/28/2019   Posterior vitreous detachment, bilateral 01/10/2016   Primary biliary cholangitis (HCC) 10/27/2007   Qualifier: Diagnosis of  By: Genie CMA (AAMA), Chick     Pure hypercholesterolemia    Retinal tear, right 07/03/2011   Shortness of breath 12/13/2019   Stage 3b chronic kidney disease (HCC) 09/11/2019   Type II or unspecified type diabetes mellitus without mention of complication, not stated as uncontrolled    Uterine fibroid 01/09/12   Past Surgical History:  Procedure Laterality  Date   BREAST CYST EXCISION     left   CARDIOVERSION N/A 11/27/2017   Procedure: CARDIOVERSION;  Surgeon: Francyne Headland, MD;  Location: [facility];  Service: Cardiovascular;  Laterality: N/A;   COLONOSCOPY     INSERTION OF MESH  05/18/2012   Procedure: INSERTION OF MESH;  Surgeon: Elspeth KYM Schultze, MD;  Location: [facility];  Service: General;  Laterality: N/A;   LIVER BIOPSY  05/18/2012   Procedure: LIVER BIOPSY;  Surgeon: Elspeth KYM Schultze, MD;  Location: [facility];  Service: General;;   POLYPECTOMY     THYROIDECTOMY, PARTIAL  1980   TUBAL LIGATION  1972   VENTRAL HERNIA REPAIR  05/18/2012   Procedure: LAPAROSCOPIC VENTRAL HERNIA;  Surgeon: Elspeth KYM Schultze, MD;  Location: [facility];  Service: General;  Laterality: N/A;  Laparoscopic Ventral Wall Hernia with Mesh    Family History  Problem Relation Age of Onset   Stroke Mother 67   Heart attack Father    Diabetes Sister        x 2   Aneurysm Sister    Stroke Sister    Congestive Heart Failure Sister    Diabetes Brother    Kidney failure Other    Hyperlipidemia Other    Arrhythmia Other    Congestive Heart Failure Other    Hypertension Other    Arthritis Other    Colon cancer Neg Hx    Esophageal cancer Neg Hx    Stomach cancer Neg Hx    Rectal cancer Neg Hx    Social History   Socioeconomic History   Marital status: Married    Spouse name: Not on file   Number of children: 2   Years of education: Not on file   Highest education level: Not on file  Occupational History   Occupation: retired    Associate Professor: RETIRED  Tobacco Use   Smoking status: Never   Smokeless tobacco: Never  Vaping Use   Vaping status: Never Used  Substance and Sexual Activity   Alcohol use: No   Drug use: No  Sexual activity: Not Currently  Other Topics Concern   Not on file  Social History Narrative   Not on file   Social Drivers of Health   Financial Resource Strain: Low Risk  (04/23/2023)   Overall Financial Resource Strain (CARDIA)     Difficulty of Paying Living Expenses: Not hard at all  Food Insecurity: No Food Insecurity (04/23/2023)   Hunger Vital Sign    Worried About Running Out of Food in the Last Year: Never true    Ran Out of Food in the Last Year: Never true  Transportation Needs: No Transportation Needs (04/23/2023)   PRAPARE - Administrator, Civil Service (Medical): No    Lack of Transportation (Non-Medical): No  Physical Activity: Insufficiently Active (04/23/2023)   Exercise Vital Sign    Days of Exercise per Week: 6 days    Minutes of Exercise per Session: 20 min  Stress: No Stress Concern Present (04/23/2023)   [employer] of Occupational Health - Occupational Stress Questionnaire    Feeling of Stress : Not at all  Social Connections: Moderately Integrated (04/23/2023)   Social Connection and Isolation Panel [NHANES]    Frequency of Communication with Friends and Family: More than three times a week    Frequency of Social Gatherings with Friends and Family: Once a week    Attends Religious Services: More than 4 times per year    Active Member of [employer] or Organizations: Yes    Attends Banker Meetings: More than 4 times per year    Marital Status: Widowed    Objective:  BP 102/62   Pulse 76   Temp (!) 97.3 F (36.3 C) (Temporal)   Ht 5' 2 (1.575 m)   Wt 153 lb 6.4 oz (69.6 kg)   SpO2 96%   BMI 28.06 kg/m      05/26/2023   11:35 AM 04/23/2023   10:45 AM 04/21/2023   10:06 AM  BP/Weight  Systolic BP 102 120 104  Diastolic BP 62 80 70  Wt. (Lbs) 153.4 157 156  BMI 28.06 kg/m2 28.72 kg/m2 28.53 kg/m2    Physical Exam Vitals reviewed.  Constitutional:      Appearance: Normal appearance.  Cardiovascular:     Rate and Rhythm: Normal rate and regular rhythm.     Heart sounds: Normal heart sounds.  Pulmonary:     Effort: Pulmonary effort is normal.     Breath sounds: Normal breath sounds.  Neurological:     Mental Status: She is alert. Mental status  is at baseline.  Psychiatric:        Mood and Affect: Mood normal.        Behavior: Behavior normal.     Diabetic Foot Exam - Simple   No data filed      Lab Results  Component Value Date   WBC 8.7 04/20/2023   HGB 11.7 04/20/2023   HCT 36.2 04/20/2023   PLT 191 04/20/2023   GLUCOSE 152 (H) 04/20/2023   CHOL 112 04/20/2023   TRIG 96 04/20/2023   HDL 46 04/20/2023   LDLCALC 48 04/20/2023   ALT 13 04/20/2023   AST 25 04/20/2023   NA 142 04/20/2023   K 3.8 04/20/2023   CL 96 04/20/2023   CREATININE 2.51 (H) 04/20/2023   BUN 94 (HH) 04/20/2023   CO2 25 04/20/2023   TSH 2.020 09/10/2020   INR 1.2 (H) 02/24/2022   HGBA1C 8.8 (H) 04/20/2023      Assessment &  Plan:    Type 2 diabetes mellitus with stage 3b chronic kidney disease, with long-term current use of insulin  (HCC) Assessment & Plan: Continue jardiance  10 mg daily.  Continue insulin  at current dose.  Change CGM to a glucometer.       No orders of the defined types were placed in this encounter.   No orders of the defined types were placed in this encounter.    Follow-up: Return in about 2 months (around 07/24/2023) for chronic follow up.   I,Lauren M Auman,acting as a scribe for Abigail Free, MD.,have documented all relevant documentation on the behalf of Abigail Free, MD,as directed by  Abigail Free, MD while in the presence of Abigail Free, MD.   An After Visit Summary was printed and given to the patient.  Abigail Free, MD [facility] [phone]

## 2023-05-27 ENCOUNTER — Telehealth: Payer: Self-pay

## 2023-05-27 NOTE — Telephone Encounter (Signed)
 PA submitted to insurance via covermymeds for bd nano needles.

## 2023-05-28 NOTE — Telephone Encounter (Signed)
This has been approved.

## 2023-05-31 NOTE — Assessment & Plan Note (Signed)
Continue jardiance 10 mg daily.  Continue insulin at current dose.  Change CGM to a glucometer.

## 2023-06-30 DIAGNOSIS — N184 Chronic kidney disease, stage 4 (severe): Secondary | ICD-10-CM | POA: Diagnosis not present

## 2023-07-07 ENCOUNTER — Ambulatory Visit: Payer: Medicare Other

## 2023-07-07 ENCOUNTER — Ambulatory Visit
Admission: RE | Admit: 2023-07-07 | Discharge: 2023-07-07 | Disposition: A | Discharge status: Home / Self Care | Payer: Medicare Other | Source: Ambulatory Visit | Attending: Family Medicine | Admitting: Family Medicine

## 2023-07-07 DIAGNOSIS — I5030 Unspecified diastolic (congestive) heart failure: Secondary | ICD-10-CM | POA: Diagnosis not present

## 2023-07-07 DIAGNOSIS — E1122 Type 2 diabetes mellitus with diabetic chronic kidney disease: Secondary | ICD-10-CM | POA: Diagnosis not present

## 2023-07-07 DIAGNOSIS — E785 Hyperlipidemia, unspecified: Secondary | ICD-10-CM | POA: Diagnosis not present

## 2023-07-07 DIAGNOSIS — N184 Chronic kidney disease, stage 4 (severe): Secondary | ICD-10-CM | POA: Diagnosis not present

## 2023-07-07 DIAGNOSIS — I129 Hypertensive chronic kidney disease with stage 1 through stage 4 chronic kidney disease, or unspecified chronic kidney disease: Secondary | ICD-10-CM | POA: Diagnosis not present

## 2023-07-07 DIAGNOSIS — Z1231 Encounter for screening mammogram for malignant neoplasm of breast: Secondary | ICD-10-CM

## 2023-07-16 DIAGNOSIS — H43813 Vitreous degeneration, bilateral: Secondary | ICD-10-CM | POA: Diagnosis not present

## 2023-07-16 DIAGNOSIS — E119 Type 2 diabetes mellitus without complications: Secondary | ICD-10-CM | POA: Diagnosis not present

## 2023-07-16 DIAGNOSIS — H40003 Preglaucoma, unspecified, bilateral: Secondary | ICD-10-CM | POA: Diagnosis not present

## 2023-07-16 DIAGNOSIS — H33311 Horseshoe tear of retina without detachment, right eye: Secondary | ICD-10-CM | POA: Diagnosis not present

## 2023-07-16 LAB — HM DIABETES EYE EXAM

## 2023-07-19 ENCOUNTER — Other Ambulatory Visit: Payer: Self-pay

## 2023-07-19 DIAGNOSIS — K219 Gastro-esophageal reflux disease without esophagitis: Secondary | ICD-10-CM

## 2023-07-19 DIAGNOSIS — I131 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, with stage 1 through stage 4 chronic kidney disease, or unspecified chronic kidney disease: Secondary | ICD-10-CM

## 2023-07-19 DIAGNOSIS — E1122 Type 2 diabetes mellitus with diabetic chronic kidney disease: Secondary | ICD-10-CM

## 2023-07-19 DIAGNOSIS — E782 Mixed hyperlipidemia: Secondary | ICD-10-CM

## 2023-07-19 DIAGNOSIS — N1832 Chronic kidney disease, stage 3b: Secondary | ICD-10-CM

## 2023-07-20 ENCOUNTER — Other Ambulatory Visit: Payer: Medicare Other

## 2023-07-20 DIAGNOSIS — Z794 Long term (current) use of insulin: Secondary | ICD-10-CM | POA: Diagnosis not present

## 2023-07-20 DIAGNOSIS — E782 Mixed hyperlipidemia: Secondary | ICD-10-CM | POA: Diagnosis not present

## 2023-07-20 DIAGNOSIS — E1122 Type 2 diabetes mellitus with diabetic chronic kidney disease: Secondary | ICD-10-CM | POA: Diagnosis not present

## 2023-07-20 DIAGNOSIS — I131 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, with stage 1 through stage 4 chronic kidney disease, or unspecified chronic kidney disease: Secondary | ICD-10-CM | POA: Diagnosis not present

## 2023-07-20 DIAGNOSIS — N1832 Chronic kidney disease, stage 3b: Secondary | ICD-10-CM | POA: Diagnosis not present

## 2023-07-21 LAB — CBC WITH DIFFERENTIAL/PLATELET
Basophils Absolute: 0 10*3/uL (ref 0.0–0.2)
Basos: 0 %
EOS (ABSOLUTE): 0.1 10*3/uL (ref 0.0–0.4)
Eos: 1 %
Hematocrit: 37.5 % (ref 34.0–46.6)
Hemoglobin: 12.6 g/dL (ref 11.1–15.9)
Immature Grans (Abs): 0 10*3/uL (ref 0.0–0.1)
Immature Granulocytes: 0 %
Lymphocytes Absolute: 1.5 10*3/uL (ref 0.7–3.1)
Lymphs: 19 %
MCH: 33 pg (ref 26.6–33.0)
MCHC: 33.6 g/dL (ref 31.5–35.7)
MCV: 98 fL — ABNORMAL HIGH (ref 79–97)
Monocytes Absolute: 0.9 10*3/uL (ref 0.1–0.9)
Monocytes: 11 %
Neutrophils Absolute: 5.5 10*3/uL (ref 1.4–7.0)
Neutrophils: 69 %
Platelets: 176 10*3/uL (ref 150–450)
RBC: 3.82 x10E6/uL (ref 3.77–5.28)
RDW: 14 % (ref 11.7–15.4)
WBC: 8 10*3/uL (ref 3.4–10.8)

## 2023-07-21 LAB — MICROALBUMIN / CREATININE URINE RATIO
Creatinine, Urine: 67.9 mg/dL
Microalb/Creat Ratio: 18 mg/g{creat} (ref 0–29)
Microalbumin, Urine: 12.2 ug/mL

## 2023-07-21 LAB — COMPREHENSIVE METABOLIC PANEL
ALT: 16 IU/L (ref 0–32)
AST: 24 IU/L (ref 0–40)
Albumin: 3.9 g/dL (ref 3.7–4.7)
Alkaline Phosphatase: 153 IU/L — ABNORMAL HIGH (ref 44–121)
BUN/Creatinine Ratio: 33 — ABNORMAL HIGH (ref 12–28)
BUN: 87 mg/dL (ref 8–27)
Bilirubin Total: 0.5 mg/dL (ref 0.0–1.2)
CO2: 27 mmol/L (ref 20–29)
Calcium: 9.7 mg/dL (ref 8.7–10.3)
Chloride: 94 mmol/L — ABNORMAL LOW (ref 96–106)
Creatinine, Ser: 2.6 mg/dL — ABNORMAL HIGH (ref 0.57–1.00)
Globulin, Total: 3.3 g/dL (ref 1.5–4.5)
Glucose: 108 mg/dL — ABNORMAL HIGH (ref 70–99)
Potassium: 3.7 mmol/L (ref 3.5–5.2)
Sodium: 141 mmol/L (ref 134–144)
Total Protein: 7.2 g/dL (ref 6.0–8.5)
eGFR: 17 mL/min/{1.73_m2} — ABNORMAL LOW (ref >59)

## 2023-07-21 LAB — LIPID PANEL
Chol/HDL Ratio: 2.3 ratio (ref 0.0–4.4)
Cholesterol, Total: 107 mg/dL (ref 100–199)
HDL: 47 mg/dL (ref >39)
LDL Chol Calc (NIH): 45 mg/dL (ref 0–99)
Triglycerides: 72 mg/dL (ref 0–149)
VLDL Cholesterol Cal: 15 mg/dL (ref 5–40)

## 2023-07-21 LAB — HEMOGLOBIN A1C
Est. average glucose Bld gHb Est-mCnc: 200 mg/dL
Hgb A1c MFr Bld: 8.6 % — ABNORMAL HIGH (ref 4.8–5.6)

## 2023-07-22 NOTE — Progress Notes (Unsigned)
 Subjective:  Patient ID: Sara Hancock, female    DOB: Apr 17, 1935  Age: 89 y.o. MRN: [id number]  Chief Complaint  Patient presents with   Medical Management of Chronic Issues   Discussed the use of AI scribe software for clinical note transcription with the patient, who gave verbal consent to proceed.  History of Present Illness   The patient, with a history of diabetes, hypertension, atrial fibrillation, gout, and kidney dysfunction, presents with poorly controlled diabetes. She reports a recent dietary change, shifting from a diet of chicken and fish to one with more carbohydrates, such as fettuccine. She denies consumption of sweets. Her current diabetes regimen includes Lantus (54 units in the morning and 36 units in the evening) and Jardiance. She reports morning blood sugars ranging from 70 to 105 and evening blood sugars in the 140s to 150s. She has not previously been on short-acting insulin before meals.  The patient also reports a recent eye exam, which showed no signs of glaucoma. Her current medications for hypertension include metoprolol (100mg  twice daily), spironolactone (25mg  daily), torsemide (40mg  twice daily), and amlodipine (2.5mg  daily). She also takes pantoprazole for reflux, which she reports is effective. She has Flonase nasal spray and Allegra for allergies, which she takes as needed. For atrial fibrillation, she is on Eliquis twice daily. She also takes allopurinol for gout.     Diabetes:  Complications: Hyperlipidemia Glucose checking: twice a day Glucose logs: 70-105  morning and 140-150's in the evening Hypoglycemia: early morning (eats a early morning snack before breakfast, when she gets up to go to the bathroom).   Most recent A1C: 8.6 Current medications: Lantus 54 units in the morning and 36 units in the evening. Jardiance 10 mg daily. Last Eye Exam: Feb. 2024.   Foot checks: daily  Hyperlipidemia: Current medications: Atorvastatin 40 mg  daily  Hypertension: Complications: Diabetes. Current medications: Amlodipine 2.5 mg daily, Metoprolol 100 mg twice a day, Spironolactone 25 mg daily and Torsemide 40 mg twice daily.    Afib: Taking Eliquis 2.5 mg twice a day        04/23/2023   11:06 AM 12/25/2022   11:09 AM 05/22/2022   10:57 AM 01/08/2022    9:49 AM 05/14/2021    2:07 PM  Depression screen PHQ 2/9  Decreased Interest 0 0 0 0 0  Down, Depressed, Hopeless 0 0 0 0 0  PHQ - 2 Score 0 0 0 0 0        04/23/2023   10:50 AM  Fall Risk   Falls in the past year? 0  Number falls in past yr: 0  Injury with Fall? 0  Risk for fall due to : Impaired mobility  Follow up Education provided    Patient Care Team: Blane Ohara, MD as PCP - General (Internal Medicine) Thomasene Ripple, DO as PCP - Cardiology (Cardiology) Mardella Layman, MD as Consulting Physician (Gastroenterology) Duke Salvia, MD as Consulting Physician (Cardiology) Meryl Dare, MD (Inactive) as Consulting Physician (Gastroenterology) Zettie Pho, [employer] (Inactive) as Pharmacist (Pharmacist) Darnell Level, MD as Consulting Physician (Internal Medicine) Zettie Pho, [facility] (Inactive) (Pharmacist)   Review of Systems  Constitutional:  Negative for chills, fatigue and fever.  HENT:  Negative for congestion, ear pain and sore throat.   Respiratory:  Negative for cough and shortness of breath.   Cardiovascular:  Negative for chest pain and palpitations.  Gastrointestinal:  Negative for abdominal pain, constipation, diarrhea, nausea and vomiting.  Endocrine: Negative for polydipsia, polyphagia and polyuria.  Genitourinary:  Negative for difficulty urinating and dysuria.  Musculoskeletal:  Negative for arthralgias, back pain and myalgias.  Skin:  Negative for rash.  Neurological:  Negative for headaches.  Psychiatric/Behavioral:  Negative for dysphoric mood. The patient is not nervous/anxious.     Current Outpatient Medications on File  Prior to Visit  Medication Sig Dispense Refill   Accu-Chek FastClix Lancets MISC USE TO CHECK BLOOD SUGAR TWICE DAILY 204 each 1   allopurinol (ZYLOPRIM) 100 MG tablet TAKE ONE-HALF TABLET BY MOUTH EVERY OTHER DAY 22 tablet 2   amLODipine (NORVASC) 2.5 MG tablet TAKE 1 TABLET(2.5 MG) BY MOUTH DAILY 90 tablet 1   BD PEN NEEDLE NANO 2ND GEN 32G X 4 MM MISC USE A NEEDLE EACH TIME FOR INSULIN INJECTION 200 each 1   calcium-vitamin D (OSCAL WITH D) 250-125 MG-UNIT per tablet Take 1 tablet by mouth 2 (two) times daily.     Cholecalciferol (VITAMIN D3) 1000 units CAPS Take 1,000 Units by mouth daily.     empagliflozin (JARDIANCE) 10 MG TABS tablet Take 1 tablet (10 mg total) by mouth daily before breakfast. 90 tablet 0   fexofenadine (ALLEGRA ALLERGY) 60 MG tablet Take 1 tablet (60 mg total) by mouth 2 (two) times daily. (Patient taking differently: Take 60 mg by mouth daily.) 180 tablet 1   fluticasone (FLONASE) 50 MCG/ACT nasal spray Place 1 spray into both nostrils as needed.     folic acid (FOLVITE) 400 MCG tablet Take 400 mcg by mouth daily.     glucose blood (ACCU-CHEK GUIDE) test strip USE AS INSTRUCTED TWICE DAILY 200 strip 3   insulin glargine (LANTUS SOLOSTAR) 100 UNIT/ML Solostar Pen Inject 54 units under the skin every morning and 36 units with Dinner 15 mL PRN   metolazone (ZAROXOLYN) 5 MG tablet Take 1 tablet (5 mg total) by mouth once a week. 12 tablet 3   Multiple Vitamin (MULTIVITAMIN WITH MINERALS) TABS Take 1 tablet by mouth daily.     nystatin cream (MYCOSTATIN) APPLY SUFFICIENT AMOUNT TOPICALLY TO THE AFFECTED AREA TWICE DAILY 30 g 1   spironolactone (ALDACTONE) 25 MG tablet Take 25 mg by mouth daily.     torsemide (DEMADEX) 20 MG tablet TAKE 2 TABLETS BY MOUTH TWICE DAILY (Patient taking differently: 20 mg daily.) 360 tablet 0   ursodiol (ACTIGALL) 300 MG capsule TAKE 1 CAPSULE(300 MG) BY MOUTH TWICE DAILY 180 capsule 3   vitamin C (ASCORBIC ACID) 500 MG tablet Take 500 mg by mouth  daily.     vitamin E 400 UNIT capsule Take 400 Units by mouth daily.     No current facility-administered medications on file prior to visit.   Past Medical History:  Diagnosis Date   Acquired thrombophilia (HCC) 09/11/2019   Allergic rhinitis 09/17/2015   Anemia    ANEMIA, NORMOCYTIC 10/27/2007   Qualifier: Diagnosis of  By: Koleen Distance CMA (AAMA), Leisha     Anticoagulant long-term use 01/08/2011   Arthritis    Benign neoplasm of colon 01/08/2011   Biliary cirrhosis (HCC)    BRADYCARDIA 11/15/2008   Qualifier: Diagnosis of  By: Wyn Quaker CMA, Amanda     Cellulitis of left foot 09/25/2016   Cholelithiasis 01/09/12   Chronic kidney disease, stage 3 (HCC)    Combined form of age-related cataract, both eyes 02/26/2017   Diabetes mellitus type 2 without retinopathy (HCC) 10/27/2013   Diverticulosis of colon with hemorrhage 06/08/2000   Dyslipidemia 09/17/2015  Encounter for therapeutic drug monitoring 06/09/2013   Esophageal reflux 01/08/2011   Esophageal stenosis 01/08/11   Esophagitis 01/08/11   GALLSTONES 10/26/2008   Qualifier: Diagnosis of  By: Jarold Motto MD Lang Snow    Gastritis, erosive chronic 01/08/2011   GERD (gastroesophageal reflux disease)    Glaucoma suspect of both eyes 02/26/2017   Glaucoma suspect of both eyes 02/26/2017   Hiatal hernia 01/08/11   History of IBS 10/27/2007   Qualifier: Diagnosis of  By: Koleen Distance CMA (AAMA), Hulan Saas     HTN (hypertension)    Hx of adenomatous colonic polyps 01/08/11   Hyperplastic colon polyp 06/08/2000, 07/31/2003, 01/08/11   Hypertension, essential 10/27/2007   Qualifier: Diagnosis of  By: Koleen Distance CMA Duncan Dull), Hulan Saas     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, with stage 1 through stage 4 chronic kidney disease, or unspecified chronic kidney disease    Iron deficiency anemia 01/08/2011   Left foot pain 09/25/2016   Localized edema    Long term (current) use of insulin (HCC)    Mixed hyperlipidemia    Nuclear cataract 07/03/2011    Obesity    Obesity (BMI 30-39.9) 03/22/2012   Osteoarthritis of right thumb 09/17/2015   Osteopenia    Other iron deficiency anemias    Other specified menopausal and perimenopausal disorders    PAF (paroxysmal atrial fibrillation) (HCC)    occurring in the setting of bradycardia and mild first degree AV block followed by Dr. Clide Cliff   Pain in thoracic spine    Paraesophageal hiatal hernia, 3cm by EGD 03/22/2012   Pedal edema 12/13/2019   Persistent atrial fibrillation (HCC) 08/28/2019   Posterior vitreous detachment, bilateral 01/10/2016   Primary biliary cholangitis (HCC) 10/27/2007   Qualifier: Diagnosis of  By: Koleen Distance CMA (AAMA), Hulan Saas     Pure hypercholesterolemia    Retinal tear, right 07/03/2011   Shortness of breath 12/13/2019   Stage 3b chronic kidney disease (HCC) 09/11/2019   Type II or unspecified type diabetes mellitus without mention of complication, not stated as uncontrolled    Uterine fibroid 01/09/12   Past Surgical History:  Procedure Laterality Date   BREAST CYST EXCISION     left   CARDIOVERSION N/A 11/27/2017   Procedure: CARDIOVERSION;  Surgeon: Thurmon Fair, MD;  Location: [facility];  Service: Cardiovascular;  Laterality: N/A;   COLONOSCOPY     INSERTION OF MESH  05/18/2012   Procedure: INSERTION OF MESH;  Surgeon: Ardeth Sportsman, MD;  Location: [facility];  Service: General;  Laterality: N/A;   LIVER BIOPSY  05/18/2012   Procedure: LIVER BIOPSY;  Surgeon: Ardeth Sportsman, MD;  Location: [facility];  Service: General;;   POLYPECTOMY     THYROIDECTOMY, PARTIAL  1980   TUBAL LIGATION  1972   VENTRAL HERNIA REPAIR  05/18/2012   Procedure: LAPAROSCOPIC VENTRAL HERNIA;  Surgeon: Ardeth Sportsman, MD;  Location: [facility];  Service: General;  Laterality: N/A;  Laparoscopic Ventral Wall Hernia with Mesh    Family History  Problem Relation Age of Onset   Stroke Mother 49   Heart attack Father    Diabetes Sister        x 2   Aneurysm Sister    Stroke Sister    Congestive Heart  Failure Sister    Diabetes Brother    Kidney failure Other    Hyperlipidemia Other    Arrhythmia Other    Congestive Heart Failure Other    Hypertension Other  Arthritis Other    Colon cancer Neg Hx    Esophageal cancer Neg Hx    Stomach cancer Neg Hx    Rectal cancer Neg Hx    Social History   Socioeconomic History   Marital status: Married    Spouse name: Not on file   Number of children: 2   Years of education: Not on file   Highest education level: Not on file  Occupational History   Occupation: retired    Associate Professor: RETIRED  Tobacco Use   Smoking status: Never   Smokeless tobacco: Never  Vaping Use   Vaping status: Never Used  Substance and Sexual Activity   Alcohol use: No   Drug use: No   Sexual activity: Not Currently  Other Topics Concern   Not on file  Social History Narrative   Not on file   Social Drivers of Health   Financial Resource Strain: Low Risk  (04/23/2023)   Overall Financial Resource Strain (CARDIA)    Difficulty of Paying Living Expenses: Not hard at all  Food Insecurity: No Food Insecurity (04/23/2023)   Hunger Vital Sign    Worried About Running Out of Food in the Last Year: Never true    Ran Out of Food in the Last Year: Never true  Transportation Needs: No Transportation Needs (04/23/2023)   PRAPARE - Administrator, Civil Service (Medical): No    Lack of Transportation (Non-Medical): No  Physical Activity: Insufficiently Active (04/23/2023)   Exercise Vital Sign    Days of Exercise per Week: 6 days    Minutes of Exercise per Session: 20 min  Stress: No Stress Concern Present (04/23/2023)   [employer] of Occupational Health - Occupational Stress Questionnaire    Feeling of Stress : Not at all  Social Connections: Moderately Integrated (04/23/2023)   Social Connection and Isolation Panel [NHANES]    Frequency of Communication with Friends and Family: More than three times a week    Frequency of Social  Gatherings with Friends and Family: Once a week    Attends Religious Services: More than 4 times per year    Active Member of [employer] or Organizations: Yes    Attends Banker Meetings: More than 4 times per year    Marital Status: Widowed    Objective:  BP 110/70   Pulse (!) 59   Temp (!) 97.4 F (36.3 C)   Resp 14   Ht 5\' 2"  (1.575 m)   Wt 153 lb (69.4 kg)   SpO2 97%   BMI 27.98 kg/m      07/23/2023   11:01 AM 05/26/2023   11:35 AM 04/23/2023   10:45 AM  BP/Weight  Systolic BP 110 102 120  Diastolic BP 70 62 80  Wt. (Lbs) 153 153.4 157  BMI 27.98 kg/m2 28.06 kg/m2 28.72 kg/m2    Physical Exam Vitals reviewed.  Constitutional:      Appearance: Normal appearance. She is normal weight.  Neck:     Vascular: No carotid bruit.  Cardiovascular:     Rate and Rhythm: Normal rate and regular rhythm.     Pulses: Normal pulses.     Heart sounds: Normal heart sounds.  Pulmonary:     Effort: Pulmonary effort is normal. No respiratory distress.     Breath sounds: Normal breath sounds.  Abdominal:     General: Abdomen is flat. Bowel sounds are normal.     Palpations: Abdomen is soft.  Tenderness: There is no abdominal tenderness.  Neurological:     Mental Status: She is alert and oriented to person, place, and time.  Psychiatric:        Mood and Affect: Mood normal.        Behavior: Behavior normal.     Diabetic Foot Exam - Simple   Simple Foot Form  07/22/2023  8:48 PM  Visual Inspection No deformities, no ulcerations, no other skin breakdown bilaterally: Yes Sensation Testing Intact to touch and monofilament testing bilaterally: Yes Pulse Check Posterior Tibialis and Dorsalis pulse intact bilaterally: Yes Comments      Lab Results  Component Value Date   WBC 8.0 07/20/2023   HGB 12.6 07/20/2023   HCT 37.5 07/20/2023   PLT 176 07/20/2023   GLUCOSE 108 (H) 07/20/2023   CHOL 107 07/20/2023   TRIG 72 07/20/2023   HDL 47 07/20/2023   LDLCALC 45  07/20/2023   ALT 16 07/20/2023   AST 24 07/20/2023   NA 141 07/20/2023   K 3.7 07/20/2023   CL 94 (L) 07/20/2023   CREATININE 2.60 (H) 07/20/2023   BUN 87 (HH) 07/20/2023   CO2 27 07/20/2023   TSH 2.020 09/10/2020   INR 1.2 (H) 02/24/2022   HGBA1C 8.6 (H) 07/20/2023      Assessment & Plan:    CKD stage 4 due to type 2 diabetes mellitus (HCC) Assessment & Plan: Not at goal. - Discontinue Jardiance after current supply. - Lantus (54 units in the morning and 36 units before supper.) - Initiate Humalog, 10 units before largest meal. - Monitor glucose morning and pre-supper. - Reassess glucose control in 3 months. STOP JARDIANCE DUE TO CKD STAGE 4.Marland Kitchen  START ON HUMALOG 10 U BEFORE SUPPER.    Gastroesophageal reflux disease without esophagitis Assessment & Plan: At goal.  Continue pantoprazole 40 mg once daily.   Mixed hyperlipidemia Assessment & Plan: Well controlled.  No changes to medicines. Continue atorvastatin 40 mg before bed.  Continue to work on eating a healthy diet and exercise.  Labs reviewed.    PAF (paroxysmal atrial fibrillation) (HCC) Assessment & Plan: Stable. The current medical regimen is effective;  continue present plan and medications. Continue eliquis and metoprolol.     Primary biliary cholangitis (HCC) Assessment & Plan: Continue ursodiol. Management per specialist.     Non-seasonal allergic rhinitis due to pollen Assessment & Plan: Well controlled.  Continue Flonase and Allegra.      Meds ordered this encounter  Medications   DISCONTD: insulin lispro (HUMALOG) 100 UNIT/ML injection    Sig: Before supper.    Dispense:  9 mL    Refill:  0    No orders of the defined types were placed in this encounter.    Follow-up: No follow-ups on file.   I,Marla I Leal-Borjas,acting as a scribe for Blane Ohara, MD.,have documented all relevant documentation on the behalf of Blane Ohara, MD,as directed by  Blane Ohara, MD while in the  presence of Blane Ohara, MD.   An After Visit Summary was printed and given to the patient.  Blane Ohara, MD [facility] [phone]

## 2023-07-23 ENCOUNTER — Ambulatory Visit (INDEPENDENT_AMBULATORY_CARE_PROVIDER_SITE_OTHER): Payer: Medicare Other | Admitting: Family Medicine

## 2023-07-23 ENCOUNTER — Encounter: Payer: Self-pay | Admitting: Family Medicine

## 2023-07-23 VITALS — BP 110/70 | HR 59 | Temp 97.4°F | Resp 14 | Ht 62.0 in | Wt 153.0 lb

## 2023-07-23 DIAGNOSIS — E782 Mixed hyperlipidemia: Secondary | ICD-10-CM | POA: Diagnosis not present

## 2023-07-23 DIAGNOSIS — K743 Primary biliary cirrhosis: Secondary | ICD-10-CM | POA: Diagnosis not present

## 2023-07-23 DIAGNOSIS — K219 Gastro-esophageal reflux disease without esophagitis: Secondary | ICD-10-CM | POA: Diagnosis not present

## 2023-07-23 DIAGNOSIS — N184 Chronic kidney disease, stage 4 (severe): Secondary | ICD-10-CM | POA: Diagnosis not present

## 2023-07-23 DIAGNOSIS — J301 Allergic rhinitis due to pollen: Secondary | ICD-10-CM

## 2023-07-23 DIAGNOSIS — I480 Paroxysmal atrial fibrillation: Secondary | ICD-10-CM | POA: Diagnosis not present

## 2023-07-23 DIAGNOSIS — E1122 Type 2 diabetes mellitus with diabetic chronic kidney disease: Secondary | ICD-10-CM

## 2023-07-23 DIAGNOSIS — I131 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, with stage 1 through stage 4 chronic kidney disease, or unspecified chronic kidney disease: Secondary | ICD-10-CM

## 2023-07-23 DIAGNOSIS — E119 Type 2 diabetes mellitus without complications: Secondary | ICD-10-CM

## 2023-07-23 MED ORDER — INSULIN LISPRO 100 UNIT/ML IJ SOLN: INTRAMUSCULAR | 0 refills | Status: DC

## 2023-07-23 NOTE — Patient Instructions (Signed)
 STOP JARDIANCE.  START ON HUMALOG 10 U BEFORE SUPPER.  CONTINUE OTHER MEDICINES AS PREVIOUSLY PRECRIBED.

## 2023-07-25 ENCOUNTER — Other Ambulatory Visit: Payer: Self-pay | Admitting: Cardiology

## 2023-07-25 ENCOUNTER — Other Ambulatory Visit: Payer: Self-pay | Admitting: Family Medicine

## 2023-07-25 DIAGNOSIS — I480 Paroxysmal atrial fibrillation: Secondary | ICD-10-CM

## 2023-07-26 NOTE — Assessment & Plan Note (Signed)
Well controlled.  No changes to medicines. Continue atorvastatin 40 mg before bed.  Continue to work on eating a healthy diet and exercise.  Labs reviewed.

## 2023-07-26 NOTE — Assessment & Plan Note (Signed)
Well controlled.  No changes to medicines. Lopressor 100 mg twice daily, spironolactone 25 mg daily, torsemide 40 mg twice daily.  Continue to work on eating a healthy diet and exercise.  Labs reviewed today.

## 2023-07-26 NOTE — Assessment & Plan Note (Signed)
 STOP JARDIANCE.  START ON HUMALOG 10 U BEFORE SUPPER.  CONTINUE OTHER MEDICINES AS PREVIOUSLY PRECRIBED.

## 2023-07-26 NOTE — Assessment & Plan Note (Signed)
 Continue pantoprazole 40 mg once daily.

## 2023-07-27 ENCOUNTER — Other Ambulatory Visit: Payer: Self-pay

## 2023-07-27 ENCOUNTER — Other Ambulatory Visit: Payer: Self-pay | Admitting: Family Medicine

## 2023-07-27 ENCOUNTER — Telehealth: Payer: Self-pay

## 2023-07-27 MED ORDER — INSULIN LISPRO 100 UNIT/ML IJ SOLN: INTRAMUSCULAR | 0 refills | Status: DC

## 2023-07-27 NOTE — Telephone Encounter (Signed)
 OK to take short acting at supper and long acting at the same time. Short acting will be out of her system in 4 hours per Dr. Sedalia Muta. Patient advised and verbalized understanding.   Copied from CRM [id number]. Topic: Clinical - Medication Question >> Jul 27, 2023 11:32 AM Geroge Baseman wrote: Reason for CRM: Patient states she usually takes her original insulin at dinner and her now prescription states to take at dinner as well. She would like to know if she needs to take both or just the new one at dinner time. She also states she has low blood sugar in the mornings and want to know if the new shot will effect her blood sugar. Please call to advise.

## 2023-07-27 NOTE — Telephone Encounter (Signed)
 Prescription refill request for Eliquis received. Indication:afib Last office visit:10/24 Scr:2.60  3/25 Age: 89 Weight:69.4  kg  Prescription refilled

## 2023-07-31 NOTE — Assessment & Plan Note (Signed)
Continue ursodiol. Management per specialist.   

## 2023-07-31 NOTE — Assessment & Plan Note (Deleted)
 Well controlled. Continue eliquis and metoprolol.

## 2023-07-31 NOTE — Assessment & Plan Note (Signed)
 Not at goal. - Discontinue Jardiance after current supply. - Lantus (54 units in the morning and 36 units before supper.) - Initiate Humalog, 10 units before largest meal. - Monitor glucose morning and pre-supper. - Reassess glucose control in 3 months. STOP JARDIANCE DUE TO CKD STAGE 4.Marland Kitchen  START ON HUMALOG 10 U BEFORE SUPPER.

## 2023-07-31 NOTE — Assessment & Plan Note (Signed)
 Stable. The current medical regimen is effective;  continue present plan and medications. Continue eliquis and metoprolol.

## 2023-07-31 NOTE — Assessment & Plan Note (Signed)
 Well controlled.  Continue Flonase and Allegra.

## 2023-08-04 ENCOUNTER — Other Ambulatory Visit: Payer: Self-pay

## 2023-08-04 MED ORDER — INSULIN LISPRO 100 UNIT/ML IJ SOLN: INTRAMUSCULAR | 0 refills | Status: DC

## 2023-08-08 ENCOUNTER — Other Ambulatory Visit: Payer: Self-pay | Admitting: Family Medicine

## 2023-08-08 DIAGNOSIS — E1159 Type 2 diabetes mellitus with other circulatory complications: Secondary | ICD-10-CM

## 2023-08-10 ENCOUNTER — Telehealth: Payer: Self-pay | Admitting: Family Medicine

## 2023-08-10 NOTE — Telephone Encounter (Unsigned)
 Copied from CRM [id number]. Topic: Clinical - Prescription Issue >> Aug 10, 2023  3:56 PM Eunice Blase wrote: Reason for CRM: Pt called stated pharmacy need clarification on insulin lispro (HUMALOG) 100 UNIT/ML injection. Please call [facility] #[id number] Rosalita Levan,  - [address] AT [facility] [address] [place] [address] Phone: [phone] Fax: [phone]

## 2023-08-11 ENCOUNTER — Other Ambulatory Visit: Payer: Self-pay | Admitting: Family Medicine

## 2023-08-11 DIAGNOSIS — Z794 Long term (current) use of insulin: Secondary | ICD-10-CM

## 2023-08-11 MED ORDER — EMPAGLIFLOZIN 10 MG PO TABS: 10.0000 mg | ORAL_TABLET | Freq: Every day | ORAL | 0 refills | Status: DC

## 2023-08-11 NOTE — Telephone Encounter (Signed)
 Last Fill: 05/18/23 90 tabs/0 RF  Last OV: 07/23/23 Next OV: 11/19/23  Routing to provider for review/authorization.

## 2023-08-11 NOTE — Telephone Encounter (Signed)
 Patient states she has not started short acting insulin due to the pharmacy needing clarification... patient requested that Dr. [name] herself address this once she returns on Monday.

## 2023-08-11 NOTE — Telephone Encounter (Signed)
 Left message for patient to call back. Need to know if she is taking humalog at all?

## 2023-08-11 NOTE — Telephone Encounter (Signed)
 Copied from CRM [id number]. Topic: Clinical - Medication Refill >> Aug 11, 2023  4:16 PM Patsy Lager T wrote: Most Recent Primary Care Visit:  Provider: [facility]  Department: [facility]  Visit Type: OFFICE VISIT  Date: 07/23/2023  Medication: empagliflozin (JARDIANCE) 10 MG TABS table  Has the patient contacted their pharmacy? No  Is this the correct pharmacy for this prescription? Yes If no, delete pharmacy and type the correct one.  This is the patient's preferred pharmacy:  [id number] Rosalita Levan, [place] - [address] AT [facility] [address] [place] [address] Phone: [phone] Fax: [phone]   Has the prescription been filled recently? Yes  Is the patient out of the medication? Yes  Has the patient been seen for an appointment in the last year OR does the patient have an upcoming appointment? Yes  Can we respond through MyChart? No  Agent: Please be advised that Rx refills may take up to 3 business days. We ask that you follow-up with your pharmacy.  Patient wants to make sure she can stay on the medication

## 2023-08-18 ENCOUNTER — Other Ambulatory Visit: Payer: Self-pay | Admitting: Family Medicine

## 2023-08-18 MED ORDER — INSULIN LISPRO 100 UNIT/ML IJ SOLN
10.0000 [IU] | Freq: Every day | INTRAMUSCULAR | 0 refills | Status: DC
Start: 2023-08-18 — End: 2023-08-20

## 2023-08-18 NOTE — Telephone Encounter (Signed)
 Left message at pharmacy. Dr. Sedalia Muta

## 2023-08-18 NOTE — Telephone Encounter (Signed)
 Resent. Dr. Sedalia Muta

## 2023-08-20 ENCOUNTER — Other Ambulatory Visit: Payer: Self-pay

## 2023-08-20 MED ORDER — INSULIN LISPRO (1 UNIT DIAL) 100 UNIT/ML (KWIKPEN): PEN_INJECTOR | SUBCUTANEOUS | 0 refills | Status: DC

## 2023-09-16 ENCOUNTER — Telehealth: Payer: Self-pay

## 2023-09-16 NOTE — Telephone Encounter (Signed)
 Please call this patient to schedule the next AWV appointment with Delford Felling, FNP Last date of AWV: 04/23/2023

## 2023-09-17 NOTE — Telephone Encounter (Signed)
 Contacted Sara Hancock to schedule their annual wellness visit. Appointment made for 04/25/2024.

## 2023-10-07 DIAGNOSIS — N184 Chronic kidney disease, stage 4 (severe): Secondary | ICD-10-CM | POA: Diagnosis not present

## 2023-10-18 ENCOUNTER — Other Ambulatory Visit: Payer: Self-pay | Admitting: Family Medicine

## 2023-10-27 ENCOUNTER — Other Ambulatory Visit: Payer: Self-pay

## 2023-10-27 ENCOUNTER — Telehealth: Payer: Self-pay | Admitting: Gastroenterology

## 2023-10-27 DIAGNOSIS — K743 Primary biliary cirrhosis: Secondary | ICD-10-CM

## 2023-10-27 NOTE — Telephone Encounter (Signed)
 See note below, pt had RUQ US  in November.

## 2023-10-27 NOTE — Telephone Encounter (Signed)
 Order in epic for US ,

## 2023-10-27 NOTE — Telephone Encounter (Signed)
 Yes. Prior to office visit, please order right upper quadrant ultrasound cirrhosis, HCC screening.

## 2023-10-27 NOTE — Telephone Encounter (Signed)
 Patient called and stated that she would like to inform Dr. Elvin Hammer that every 6 month before she has her appointment with Dr. Sandrea Cruel, he order her to have a ultrasound and she was wondering if Dr. Elvin Hammer was going to order her an ultrasound as well. Patient is requesting a call back.Please advise.

## 2023-10-28 NOTE — Telephone Encounter (Signed)
 Left detailed message for pt that the us  is scheduled at [facility] 11/04/23@9 :45am, pt to arrive there at 9:30am. Pt to be NPO after midnight. Will try pt again.

## 2023-10-29 NOTE — Telephone Encounter (Signed)
 Spoke to patient. She did get our message from yesterday. Reiterated to patient that she is scheduled at [facility] on 11/04/23 at 10 am, 930 am arrival and should have nothing to eat or drink after midnight. She verbalizes understanding.

## 2023-11-02 ENCOUNTER — Other Ambulatory Visit: Payer: Self-pay | Admitting: Family Medicine

## 2023-11-02 DIAGNOSIS — Z794 Long term (current) use of insulin: Secondary | ICD-10-CM

## 2023-11-02 DIAGNOSIS — E1122 Type 2 diabetes mellitus with diabetic chronic kidney disease: Secondary | ICD-10-CM

## 2023-11-03 ENCOUNTER — Ambulatory Visit: Admitting: Family Medicine

## 2023-11-04 ENCOUNTER — Ambulatory Visit (HOSPITAL_COMMUNITY): Admission: RE | Admit: 2023-11-04 | Source: Ambulatory Visit

## 2023-11-10 ENCOUNTER — Ambulatory Visit (HOSPITAL_COMMUNITY)
Admission: RE | Admit: 2023-11-10 | Discharge: 2023-11-10 | Disposition: A | Discharge status: Home / Self Care | Source: Ambulatory Visit | Attending: Internal Medicine | Admitting: Internal Medicine

## 2023-11-10 DIAGNOSIS — K7460 Unspecified cirrhosis of liver: Secondary | ICD-10-CM | POA: Diagnosis not present

## 2023-11-10 DIAGNOSIS — K743 Primary biliary cirrhosis: Secondary | ICD-10-CM | POA: Insufficient documentation

## 2023-11-10 DIAGNOSIS — N2889 Other specified disorders of kidney and ureter: Secondary | ICD-10-CM | POA: Diagnosis not present

## 2023-11-10 DIAGNOSIS — K8020 Calculus of gallbladder without cholecystitis without obstruction: Secondary | ICD-10-CM | POA: Diagnosis not present

## 2023-11-10 DIAGNOSIS — K7689 Other specified diseases of liver: Secondary | ICD-10-CM | POA: Diagnosis not present

## 2023-11-12 ENCOUNTER — Ambulatory Visit: Payer: Self-pay | Admitting: Internal Medicine

## 2023-11-15 ENCOUNTER — Other Ambulatory Visit: Payer: Self-pay

## 2023-11-15 DIAGNOSIS — E782 Mixed hyperlipidemia: Secondary | ICD-10-CM

## 2023-11-15 DIAGNOSIS — Z794 Long term (current) use of insulin: Secondary | ICD-10-CM

## 2023-11-15 DIAGNOSIS — I131 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, with stage 1 through stage 4 chronic kidney disease, or unspecified chronic kidney disease: Secondary | ICD-10-CM

## 2023-11-16 ENCOUNTER — Other Ambulatory Visit

## 2023-11-16 DIAGNOSIS — Z794 Long term (current) use of insulin: Secondary | ICD-10-CM | POA: Diagnosis not present

## 2023-11-16 DIAGNOSIS — I131 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, with stage 1 through stage 4 chronic kidney disease, or unspecified chronic kidney disease: Secondary | ICD-10-CM | POA: Diagnosis not present

## 2023-11-16 DIAGNOSIS — E1122 Type 2 diabetes mellitus with diabetic chronic kidney disease: Secondary | ICD-10-CM | POA: Diagnosis not present

## 2023-11-16 DIAGNOSIS — E782 Mixed hyperlipidemia: Secondary | ICD-10-CM

## 2023-11-16 DIAGNOSIS — N1832 Chronic kidney disease, stage 3b: Secondary | ICD-10-CM | POA: Diagnosis not present

## 2023-11-17 ENCOUNTER — Ambulatory Visit: Payer: Self-pay | Admitting: Family Medicine

## 2023-11-17 LAB — CBC WITH DIFFERENTIAL/PLATELET
Basophils Absolute: 0 x10E3/uL (ref 0.0–0.2)
Basos: 0 %
EOS (ABSOLUTE): 0 x10E3/uL (ref 0.0–0.4)
Eos: 0 %
Hematocrit: 36.2 % (ref 34.0–46.6)
Hemoglobin: 11.8 g/dL (ref 11.1–15.9)
Immature Grans (Abs): 0 x10E3/uL (ref 0.0–0.1)
Immature Granulocytes: 0 %
Lymphocytes Absolute: 1.4 x10E3/uL (ref 0.7–3.1)
Lymphs: 18 %
MCH: 33 pg (ref 26.6–33.0)
MCHC: 32.6 g/dL (ref 31.5–35.7)
MCV: 101 fL — ABNORMAL HIGH (ref 79–97)
Monocytes Absolute: 0.9 x10E3/uL (ref 0.1–0.9)
Monocytes: 12 %
Neutrophils Absolute: 5.1 x10E3/uL (ref 1.4–7.0)
Neutrophils: 70 %
Platelets: 170 x10E3/uL (ref 150–450)
RBC: 3.58 x10E6/uL — ABNORMAL LOW (ref 3.77–5.28)
RDW: 13.9 % (ref 11.7–15.4)
WBC: 7.3 x10E3/uL (ref 3.4–10.8)

## 2023-11-17 LAB — COMPREHENSIVE METABOLIC PANEL WITH GFR
ALT: 16 IU/L (ref 0–32)
AST: 23 IU/L (ref 0–40)
Albumin: 3.9 g/dL (ref 3.7–4.7)
Alkaline Phosphatase: 162 IU/L — ABNORMAL HIGH (ref 44–121)
BUN/Creatinine Ratio: 32 — ABNORMAL HIGH (ref 12–28)
BUN: 88 mg/dL (ref 8–27)
Bilirubin Total: 0.3 mg/dL (ref 0.0–1.2)
CO2: 24 mmol/L (ref 20–29)
Calcium: 9.7 mg/dL (ref 8.7–10.3)
Chloride: 96 mmol/L (ref 96–106)
Creatinine, Ser: 2.77 mg/dL — ABNORMAL HIGH (ref 0.57–1.00)
Globulin, Total: 3.4 g/dL (ref 1.5–4.5)
Glucose: 88 mg/dL (ref 70–99)
Potassium: 3.7 mmol/L (ref 3.5–5.2)
Sodium: 144 mmol/L (ref 134–144)
Total Protein: 7.3 g/dL (ref 6.0–8.5)
eGFR: 16 mL/min/1.73 — ABNORMAL LOW (ref >59)

## 2023-11-17 LAB — LIPID PANEL
Chol/HDL Ratio: 2.6 ratio (ref 0.0–4.4)
Cholesterol, Total: 116 mg/dL (ref 100–199)
HDL: 45 mg/dL (ref >39)
LDL Chol Calc (NIH): 55 mg/dL (ref 0–99)
Triglycerides: 77 mg/dL (ref 0–149)
VLDL Cholesterol Cal: 16 mg/dL (ref 5–40)

## 2023-11-17 LAB — HEMOGLOBIN A1C
Est. average glucose Bld gHb Est-mCnc: 160 mg/dL
Hgb A1c MFr Bld: 7.2 % — ABNORMAL HIGH (ref 4.8–5.6)

## 2023-11-17 LAB — TSH: TSH: 2.15 u[IU]/mL (ref 0.450–4.500)

## 2023-11-17 NOTE — Telephone Encounter (Signed)
 Jasmine from [facility] called to let us  know that the patient's STAT labs had come back and the only lab alert for them was her BUN level which was 88.

## 2023-11-17 NOTE — Telephone Encounter (Signed)
 Copied from CRM [id number]. Topic: Clinical - Lab/Test Results >> Nov 17, 2023  1:37 PM Rosaria BRAVO wrote: Reason for CRM: STAT lab, Jasmine

## 2023-11-19 ENCOUNTER — Ambulatory Visit: Admitting: Family Medicine

## 2023-11-23 ENCOUNTER — Encounter: Payer: Self-pay | Admitting: Family Medicine

## 2023-11-23 ENCOUNTER — Ambulatory Visit: Admitting: Family Medicine

## 2023-11-23 VITALS — BP 126/82 | HR 76 | Temp 98.1°F | Ht 62.0 in | Wt 151.0 lb

## 2023-11-23 DIAGNOSIS — E1122 Type 2 diabetes mellitus with diabetic chronic kidney disease: Secondary | ICD-10-CM | POA: Diagnosis not present

## 2023-11-23 DIAGNOSIS — N1832 Chronic kidney disease, stage 3b: Secondary | ICD-10-CM

## 2023-11-23 DIAGNOSIS — K7469 Other cirrhosis of liver: Secondary | ICD-10-CM | POA: Diagnosis not present

## 2023-11-23 DIAGNOSIS — K219 Gastro-esophageal reflux disease without esophagitis: Secondary | ICD-10-CM

## 2023-11-23 DIAGNOSIS — N184 Chronic kidney disease, stage 4 (severe): Secondary | ICD-10-CM

## 2023-11-23 DIAGNOSIS — E782 Mixed hyperlipidemia: Secondary | ICD-10-CM

## 2023-11-23 DIAGNOSIS — K743 Primary biliary cirrhosis: Secondary | ICD-10-CM | POA: Diagnosis not present

## 2023-11-23 DIAGNOSIS — I131 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, with stage 1 through stage 4 chronic kidney disease, or unspecified chronic kidney disease: Secondary | ICD-10-CM | POA: Diagnosis not present

## 2023-11-23 DIAGNOSIS — I480 Paroxysmal atrial fibrillation: Secondary | ICD-10-CM | POA: Diagnosis not present

## 2023-11-23 NOTE — Progress Notes (Signed)
 Subjective:  Patient ID: Sara Hancock, female    DOB: July 03, 1934  Age: 89 y.o. MRN: [id number]  Chief Complaint  Patient presents with   Medical Management of Chronic Issues   Discussed the use of AI scribe software for clinical note transcription with the patient, who gave verbal consent to proceed.  HPI: Diabetes:  Complications: Hyperlipidemia, CKD Stage 4. Glucose checking: twice a day Glucose logs: 50 - 162 Hypoglycemia: early morning (eats a early morning snack before breakfast, when she gets up to go to the bathroom). Hypoglycemia occurs every couple months.  Usually related to not eating enough.  Most recent A1C: 7.2 Current medications: Lantus  54 units in the morning and 36 units in the evening and Jardiance  10 mg daily  Jardiance  10 mg daily. Last Eye Exam:Jan 2025 Foot checks: daily - Dietary modifications include reducing carbohydrate intake and switching from cornflakes and oatmeal to Cheerios for breakfast - Occasional morning hypoglycemic episodes occur approximately every couple of months, managed with a small snack such as half a banana  Hyperlipidemia: Current medications: Atorvastatin  40 mg daily   Hypertension: Complications: Diabetes. Current medications: Amlodipine  2.5 mg daily, Metoprolol  100 mg twice a day, Spironolactone 25 mg daily and Torsemide  40 mg twice daily.     Afib: Taking Eliquis  2.5 mg twice a day and metoprolol  100 mg twice daily.      04/23/2023   11:06 AM 12/25/2022   11:09 AM 05/22/2022   10:57 AM 01/08/2022    9:49 AM 05/14/2021    2:07 PM  Depression screen PHQ 2/9  Decreased Interest 0 0 0 0 0  Down, Depressed, Hopeless 0 0 0 0 0  PHQ - 2 Score 0 0 0 0 0        04/23/2023   10:50 AM  Fall Risk   Falls in the past year? 0  Number falls in past yr: 0  Injury with Fall? 0  Risk for fall due to : Impaired mobility  Follow up Education provided    Patient Care Team: Sherre Clapper, MD as PCP - General (Family  Medicine) Tobb, Kardie, DO as PCP - Cardiology (Cardiology) Jakie Alm SAUNDERS, MD as Consulting Physician (Gastroenterology) Fernande Elspeth BROCKS, MD as Consulting Physician (Cardiology) Aneita Gwendlyn DASEN, MD (Inactive) as Consulting Physician (Gastroenterology) Macel Jayson PARAS, MD as Consulting Physician (Internal Medicine)   Review of Systems  Constitutional:  Negative for chills, fatigue and fever.  HENT:  Negative for congestion, ear pain, rhinorrhea and sore throat.   Respiratory:  Negative for cough and shortness of breath.   Cardiovascular:  Negative for chest pain.  Gastrointestinal:  Negative for abdominal pain, constipation, diarrhea, nausea and vomiting.  Genitourinary:  Negative for dysuria and urgency.  Musculoskeletal:  Positive for back pain. Negative for myalgias.  Neurological:  Negative for dizziness, weakness, light-headedness and headaches.  Psychiatric/Behavioral:  Negative for dysphoric mood. The patient is not nervous/anxious.     Current Outpatient Medications on File Prior to Visit  Medication Sig Dispense Refill   Accu-Chek FastClix Lancets MISC USE TO CHECK BLOOD SUGAR TWICE DAILY 204 each 1   allopurinol  (ZYLOPRIM ) 100 MG tablet TAKE ONE-HALF TABLET BY MOUTH EVERY OTHER DAY 22 tablet 2   amLODipine  (NORVASC ) 2.5 MG tablet TAKE 1 TABLET(2.5 MG) BY MOUTH DAILY 90 tablet 1   atorvastatin  (LIPITOR) 40 MG tablet TAKE 1 TABLET(40 MG) BY MOUTH DAILY 90 tablet 0   BD PEN NEEDLE NANO 2ND GEN 32G X 4 MM MISC USE  A NEEDLE EACH TIME FOR INSULIN  INJECTION 200 each 1   calcium -vitamin D (OSCAL WITH D) 250-125 MG-UNIT per tablet Take 1 tablet by mouth 2 (two) times daily.     Cholecalciferol (VITAMIN D3) 1000 units CAPS Take 1,000 Units by mouth daily.     ELIQUIS  2.5 MG TABS tablet TAKE 1 TABLET(2.5 MG) BY MOUTH TWICE DAILY. 180 tablet 1   fexofenadine  (ALLEGRA  ALLERGY) 60 MG tablet Take 1 tablet (60 mg total) by mouth 2 (two) times daily. (Patient taking differently: Take 60  mg by mouth daily.) 180 tablet 1   fluticasone (FLONASE) 50 MCG/ACT nasal spray Place 1 spray into both nostrils as needed.     folic acid  (FOLVITE ) 400 MCG tablet Take 400 mcg by mouth daily.     glucose blood (ACCU-CHEK GUIDE) test strip USE AS INSTRUCTED TWICE DAILY 200 strip 3   insulin  glargine (LANTUS  SOLOSTAR) 100 UNIT/ML Solostar Pen Inject 54 units under the skin every morning and 36 units with Dinner 15 mL PRN   insulin  lispro (HUMALOG ) 100 UNIT/ML KwikPen Inject 0.1 ml(10 units) into skin daily before supper. 15 mL 0   JARDIANCE  10 MG TABS tablet TAKE 1 TABLET(10 MG) BY MOUTH DAILY BEFORE BREAKFAST 90 tablet 0   metolazone  (ZAROXOLYN ) 5 MG tablet Take 1 tablet (5 mg total) by mouth once a week. 12 tablet 3   metoprolol  tartrate (LOPRESSOR ) 100 MG tablet TAKE 1 TABLET BY MOUTH TWICE DAILY 180 tablet 0   Multiple Vitamin (MULTIVITAMIN WITH MINERALS) TABS Take 1 tablet by mouth daily.     nystatin cream (MYCOSTATIN) APPLY SUFFICIENT AMOUNT TOPICALLY TO THE AFFECTED AREA TWICE DAILY 30 g 1   pantoprazole  (PROTONIX ) 40 MG tablet TAKE 1 TABLET BY MOUTH EVERY DAY 90 tablet 1   potassium chloride  SA (KLOR-CON  M) 20 MEQ tablet TAKE 1 TABLET(20 MEQ) BY MOUTH DAILY 90 tablet 0   spironolactone (ALDACTONE) 25 MG tablet Take 25 mg by mouth daily.     torsemide  (DEMADEX ) 20 MG tablet TAKE 2 TABLETS BY MOUTH TWICE DAILY (Patient taking differently: 20 mg daily.) 360 tablet 0   ursodiol  (ACTIGALL ) 300 MG capsule TAKE 1 CAPSULE(300 MG) BY MOUTH TWICE DAILY 180 capsule 3   vitamin C  (ASCORBIC ACID ) 500 MG tablet Take 500 mg by mouth daily.     vitamin E  400 UNIT capsule Take 400 Units by mouth daily.     No current facility-administered medications on file prior to visit.   Past Medical History:  Diagnosis Date   Acquired thrombophilia (HCC) 09/11/2019   Allergic rhinitis 09/17/2015   Anemia    ANEMIA, NORMOCYTIC 10/27/2007   Qualifier: Diagnosis of  By: Kowalk CMA (AAMA), Leisha     Anticoagulant  long-term use 01/08/2011   Arthritis    Benign neoplasm of colon 01/08/2011   Biliary cirrhosis (HCC)    BRADYCARDIA 11/15/2008   Qualifier: Diagnosis of  By: Rubbie CMA, Amanda     Cellulitis of left foot 09/25/2016   Cholelithiasis 01/09/12   Chronic kidney disease, stage 3 (HCC)    Combined form of age-related cataract, both eyes 02/26/2017   Diabetes mellitus type 2 without retinopathy (HCC) 10/27/2013   Diverticulosis of colon with hemorrhage 06/08/2000   Dyslipidemia 09/17/2015   Encounter for therapeutic drug monitoring 06/09/2013   Esophageal reflux 01/08/2011   Esophageal stenosis 01/08/11   Esophagitis 01/08/11   GALLSTONES 10/26/2008   Qualifier: Diagnosis of  By: Jakie MD NOLIA Alm SAUNDERS    Gastritis, erosive chronic  01/08/2011   GERD (gastroesophageal reflux disease)    Glaucoma suspect of both eyes 02/26/2017   Glaucoma suspect of both eyes 02/26/2017   Hiatal hernia 01/08/11   History of IBS 10/27/2007   Qualifier: Diagnosis of  By: Kowalk CMA (AAMA), Leisha     HTN (hypertension)    Hx of adenomatous colonic polyps 01/08/11   Hyperplastic colon polyp 06/08/2000, 07/31/2003, 01/08/11   Hypertension, essential 10/27/2007   Qualifier: Diagnosis of  By: Genie CMA LEODIS), Chick     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, with stage 1 through stage 4 chronic kidney disease, or unspecified chronic kidney disease    Iron  deficiency anemia 01/08/2011   Left foot pain 09/25/2016   Localized edema    Long term (current) use of insulin  (HCC)    Mixed hyperlipidemia    Nuclear cataract 07/03/2011   Obesity    Obesity (BMI 30-39.9) 03/22/2012   Osteoarthritis of right thumb 09/17/2015   Osteopenia    Other iron  deficiency anemias    Other specified menopausal and perimenopausal disorders    PAF (paroxysmal atrial fibrillation) (HCC)    occurring in the setting of bradycardia and mild first degree AV block followed by Dr. Beverli   Pain in thoracic spine     Paraesophageal hiatal hernia, 3cm by EGD 03/22/2012   Pedal edema 12/13/2019   Persistent atrial fibrillation (HCC) 08/28/2019   Posterior vitreous detachment, bilateral 01/10/2016   Primary biliary cholangitis (HCC) 10/27/2007   Qualifier: Diagnosis of  By: Genie CMA (AAMA), Chick     Pure hypercholesterolemia    Retinal tear, right 07/03/2011   Shortness of breath 12/13/2019   Stage 3b chronic kidney disease (HCC) 09/11/2019   Type II or unspecified type diabetes mellitus without mention of complication, not stated as uncontrolled    Uterine fibroid 01/09/12   Past Surgical History:  Procedure Laterality Date   BREAST CYST EXCISION     left   CARDIOVERSION N/A 11/27/2017   Procedure: CARDIOVERSION;  Surgeon: Francyne Headland, MD;  Location: [facility];  Service: Cardiovascular;  Laterality: N/A;   COLONOSCOPY     INSERTION OF MESH  05/18/2012   Procedure: INSERTION OF MESH;  Surgeon: Elspeth KYM Schultze, MD;  Location: [facility];  Service: General;  Laterality: N/A;   LIVER BIOPSY  05/18/2012   Procedure: LIVER BIOPSY;  Surgeon: Elspeth KYM Schultze, MD;  Location: [facility];  Service: General;;   POLYPECTOMY     THYROIDECTOMY, PARTIAL  1980   TUBAL LIGATION  1972   VENTRAL HERNIA REPAIR  05/18/2012   Procedure: LAPAROSCOPIC VENTRAL HERNIA;  Surgeon: Elspeth KYM Schultze, MD;  Location: [facility];  Service: General;  Laterality: N/A;  Laparoscopic Ventral Wall Hernia with Mesh    Family History  Problem Relation Age of Onset   Stroke Mother 31   Heart attack Father    Diabetes Sister        x 2   Aneurysm Sister    Stroke Sister    Congestive Heart Failure Sister    Diabetes Brother    Kidney failure Other    Hyperlipidemia Other    Arrhythmia Other    Congestive Heart Failure Other    Hypertension Other    Arthritis Other    Colon cancer Neg Hx    Esophageal cancer Neg Hx    Stomach cancer Neg Hx    Rectal cancer Neg Hx    Social History   Socioeconomic History  Marital status: Married    Spouse  name: Not on file   Number of children: 2   Years of education: Not on file   Highest education level: Not on file  Occupational History   Occupation: retired    Associate Professor: RETIRED  Tobacco Use   Smoking status: Never   Smokeless tobacco: Never  Vaping Use   Vaping status: Never Used  Substance and Sexual Activity   Alcohol use: No   Drug use: No   Sexual activity: Not Currently  Other Topics Concern   Not on file  Social History Narrative   Not on file   Social Drivers of Health   Financial Resource Strain: Low Risk  (04/23/2023)   Overall Financial Resource Strain (CARDIA)    Difficulty of Paying Living Expenses: Not hard at all  Food Insecurity: No Food Insecurity (04/23/2023)   Hunger Vital Sign    Worried About Running Out of Food in the Last Year: Never true    Ran Out of Food in the Last Year: Never true  Transportation Needs: No Transportation Needs (04/23/2023)   PRAPARE - Administrator, Civil Service (Medical): No    Lack of Transportation (Non-Medical): No  Physical Activity: Insufficiently Active (04/23/2023)   Exercise Vital Sign    Days of Exercise per Week: 6 days    Minutes of Exercise per Session: 20 min  Stress: No Stress Concern Present (04/23/2023)   [employer] of Occupational Health - Occupational Stress Questionnaire    Feeling of Stress : Not at all  Social Connections: Moderately Integrated (04/23/2023)   Social Connection and Isolation Panel    Frequency of Communication with Friends and Family: More than three times a week    Frequency of Social Gatherings with Friends and Family: Once a week    Attends Religious Services: More than 4 times per year    Active Member of [employer] or Organizations: Yes    Attends Banker Meetings: More than 4 times per year    Marital Status: Widowed    Objective:  BP 126/82   Pulse 76   Temp 98.1 F (36.7 C)   Ht 5' 2 (1.575 m)   Wt 151 lb (68.5 kg)   SpO2 96%   BMI 27.62  kg/m      11/23/2023    2:24 PM 07/23/2023   11:01 AM 05/26/2023   11:35 AM  BP/Weight  Systolic BP 126 110 102  Diastolic BP 82 70 62  Wt. (Lbs) 151 153 153.4  BMI 27.62 kg/m2 27.98 kg/m2 28.06 kg/m2    Physical Exam Vitals reviewed.  Constitutional:      Appearance: Normal appearance. She is normal weight.  Neck:     Vascular: No carotid bruit.  Cardiovascular:     Rate and Rhythm: Normal rate. Rhythm irregular.     Pulses: Normal pulses.     Heart sounds: Normal heart sounds.  Pulmonary:     Effort: Pulmonary effort is normal. No respiratory distress.     Breath sounds: Normal breath sounds.  Abdominal:     General: Abdomen is flat. Bowel sounds are normal.     Palpations: Abdomen is soft.     Tenderness: There is no abdominal tenderness.  Neurological:     Mental Status: She is alert and oriented to person, place, and time.  Psychiatric:        Mood and Affect: Mood normal.        Behavior: Behavior  normal.      Diabetic foot exam was performed with the following findings:   No deformities, ulcerations, or other skin breakdown Normal sensation of 10g monofilament Intact posterior tibialis and dorsalis pedis pulses      Lab Results  Component Value Date   WBC 7.3 11/16/2023   HGB 11.8 11/16/2023   HCT 36.2 11/16/2023   PLT 170 11/16/2023   GLUCOSE 88 11/16/2023   CHOL 116 11/16/2023   TRIG 77 11/16/2023   HDL 45 11/16/2023   LDLCALC 55 11/16/2023   ALT 16 11/16/2023   AST 23 11/16/2023   NA 144 11/16/2023   K 3.7 11/16/2023   CL 96 11/16/2023   CREATININE 2.77 (H) 11/16/2023   BUN 88 (HH) 11/16/2023   CO2 24 11/16/2023   TSH 2.150 11/16/2023   INR 1.2 (H) 02/24/2022   HGBA1C 7.2 (H) 11/16/2023      Assessment & Plan:  Mixed hyperlipidemia Assessment & Plan: Well controlled.  No changes to medicines. Continue atorvastatin  40 mg before bed.  Continue to work on eating a healthy diet and exercise.  Labs reviewed.    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  No changes to medicines. Lopressor  100 mg twice daily, spironolactone 25 mg daily, torsemide  40 mg twice daily.  Continue to work on eating a healthy diet and exercise.  Labs reviewed today.     CKD stage 4 due to type 2 diabetes mellitus (HCC) Assessment & Plan: Not at goal. - Taking Jardiance  10 mg daily - Lantus  (54 units in the morning and 36 units before supper.) - Continue Humalog , 10 units before largest meal. - Monitor glucose morning and pre-supper. - Reassess glucose control in 3 months.    Gastroesophageal reflux disease without esophagitis Assessment & Plan: At goal.  Continue pantoprazole  40 mg once daily.   PAF (paroxysmal atrial fibrillation) (HCC) Assessment & Plan: Stable. The current medical regimen is effective;  continue present plan and medications.  Continue eliquis  and metoprolol .     Other cirrhosis of liver (HCC) Assessment & Plan: Management per specialist.     Primary biliary cholangitis (HCC) Assessment & Plan: Management per specialist.        No orders of the defined types were placed in this encounter.   Orders Placed This Encounter  Procedures   [facility] DIABETES EYE EXAM     Follow-up: Return in about 3 months (around 02/23/2024) for chronic follow up.  I,Marla I Leal-Borjas,acting as a scribe for Abigail Free, MD.,have documented all relevant documentation on the behalf of Abigail Free, MD,as directed by  Abigail Free, MD while in the presence of Abigail Free, MD.   An After Visit Summary was printed and given to the patient.  I attest that I have reviewed this visit and agree with the plan scribed by my staff.   Abigail Free, MD [facility] [phone]

## 2023-11-27 DIAGNOSIS — N1832 Type 2 diabetes mellitus with diabetic chronic kidney disease: Secondary | ICD-10-CM | POA: Insufficient documentation

## 2023-11-27 DIAGNOSIS — Z794 Long term (current) use of insulin: Secondary | ICD-10-CM | POA: Insufficient documentation

## 2023-11-27 NOTE — Assessment & Plan Note (Signed)
Well controlled.  No changes to medicines. Lopressor 100 mg twice daily, spironolactone 25 mg daily, torsemide 40 mg twice daily.  Continue to work on eating a healthy diet and exercise.  Labs reviewed today.

## 2023-11-27 NOTE — Assessment & Plan Note (Signed)
Well controlled.  No changes to medicines. Continue atorvastatin 40 mg before bed.  Continue to work on eating a healthy diet and exercise.  Labs reviewed.

## 2023-11-27 NOTE — Assessment & Plan Note (Signed)
 Not at goal. - Taking Jardiance  10 mg daily - Lantus  (54 units in the morning and 36 units before supper.) - Continue Humalog , 10 units before largest meal. - Monitor glucose morning and pre-supper. - Reassess glucose control in 3 months.

## 2023-11-27 NOTE — Assessment & Plan Note (Signed)
 Continue pantoprazole 40 mg once daily.

## 2023-11-27 NOTE — Assessment & Plan Note (Signed)
 Stable. The current medical regimen is effective;  continue present plan and medications. Continue eliquis and metoprolol.

## 2023-11-28 DIAGNOSIS — K7469 Other cirrhosis of liver: Secondary | ICD-10-CM | POA: Insufficient documentation

## 2023-11-28 NOTE — Assessment & Plan Note (Signed)
 Management per specialist.

## 2023-12-02 DIAGNOSIS — E1122 Type 2 diabetes mellitus with diabetic chronic kidney disease: Secondary | ICD-10-CM | POA: Diagnosis not present

## 2023-12-02 DIAGNOSIS — N184 Chronic kidney disease, stage 4 (severe): Secondary | ICD-10-CM | POA: Diagnosis not present

## 2023-12-02 DIAGNOSIS — E785 Hyperlipidemia, unspecified: Secondary | ICD-10-CM | POA: Diagnosis not present

## 2023-12-02 DIAGNOSIS — I5030 Unspecified diastolic (congestive) heart failure: Secondary | ICD-10-CM | POA: Diagnosis not present

## 2023-12-02 DIAGNOSIS — I129 Hypertensive chronic kidney disease with stage 1 through stage 4 chronic kidney disease, or unspecified chronic kidney disease: Secondary | ICD-10-CM | POA: Diagnosis not present

## 2023-12-09 ENCOUNTER — Ambulatory Visit (INDEPENDENT_AMBULATORY_CARE_PROVIDER_SITE_OTHER): Admitting: Internal Medicine

## 2023-12-09 ENCOUNTER — Encounter: Payer: Self-pay | Admitting: Internal Medicine

## 2023-12-09 VITALS — BP 122/80 | HR 107 | Ht 62.0 in | Wt 152.0 lb

## 2023-12-09 DIAGNOSIS — K743 Primary biliary cirrhosis: Secondary | ICD-10-CM

## 2023-12-09 DIAGNOSIS — K219 Gastro-esophageal reflux disease without esophagitis: Secondary | ICD-10-CM

## 2023-12-09 DIAGNOSIS — Z860101 Personal history of adenomatous and serrated colon polyps: Secondary | ICD-10-CM | POA: Diagnosis not present

## 2023-12-09 MED ORDER — PANTOPRAZOLE SODIUM 40 MG PO TBEC: 40.0000 mg | DELAYED_RELEASE_TABLET | Freq: Every day | ORAL | 3 refills | Status: AC

## 2023-12-09 MED ORDER — URSODIOL 300 MG PO CAPS: ORAL_CAPSULE | ORAL | 3 refills | Status: AC

## 2023-12-09 NOTE — Patient Instructions (Signed)
 We have sent the following medications to your pharmacy for you to pick up at your convenience:  Ursodiol , Pantoprazole .  I will call you to schedule your labs, ultrasound, and follow up appointment for 6 months from now.  _______________________________________________________  If your blood pressure at your visit was 140/90 or greater, please contact your primary care physician to follow up on this.  _______________________________________________________  If you are age 87 or older, your body mass index should be between 23-30. Your Body mass index is 27.8 kg/m. If this is out of the aforementioned range listed, please consider follow up with your Primary Care Provider.  If you are age 87 or younger, your body mass index should be between 19-25. Your Body mass index is 27.8 kg/m. If this is out of the aformentioned range listed, please consider follow up with your Primary Care Provider.   ________________________________________________________  The [facility] providers would like to encourage you to use MYCHART to communicate with providers for non-urgent requests or questions.  Due to long hold times on the telephone, sending your provider a message by [facility] may be a faster and more efficient way to get a response.  Please allow 48 business hours for a response.  Please remember that this is for non-urgent requests.  _______________________________________________________  [facility] is using a team-based approach to care.  Your team is made up of your doctor and two to three APPS. Our APPS (Nurse Practitioners and Physician Assistants) work with your physician to ensure care continuity for you. They are fully qualified to address your health concerns and develop a treatment plan. They communicate directly with your gastroenterologist to care for you. Seeing the Advanced Practice Practitioners on your physician's team can help you by facilitating care more promptly, often  allowing for earlier appointments, access to diagnostic testing, procedures, and other specialty referrals.

## 2023-12-09 NOTE — Progress Notes (Signed)
 HISTORY OF PRESENT ILLNESS:  Sara Hancock is a pleasant 89 y.o. female with multiple medical problems as listed below.  Previous GI patient of Dr. Gwendlyn Buddy until his recent retirement.  She has been followed in this office for compensated primary biliary cholangitis, GERD complicated by peptic stricture, and a history of colon polyps.  I saw the patient Sep 12, 2021 when she was set up to have colonoscopy and upper endoscopy with myself after adequate Eliquis  washout to evaluate heme positive stool, rectal bleeding, and for the purposes of variceal screening.  Colonoscopy revealed diminutive polyps and hemorrhoids.  Upper endoscopy was essentially normal.  Her last visit with Dr. Buddy occurred April 21, 2023.  Assessment and recommendations from that encounter are as follows:  Assessment     Primary biliary cholangitis with compensated cirrhosis Intestinal gas, flatus - suspected lactose intolerance GERD with history of an esophageal stricture.  Last EGD May 2023.  Unremarkable Personal history of adenomatous, sessile serrated colon polyps, no longer in surveillance due to age.  Last colonoscopy May 2023 Cholelithiasis  CKD4 Pulm HTN PAF on Eliquis      Recommendations    Continue ursodiol  300 mg bid HCC screening - RUQ US , blood work in 6 months Discontinue all lactose products for 7 days and assess symptoms.  If symptoms persist begin a low gas diet and Gas-X 3 times daily prn. Continue pantoprazole  40 mg every day, follow antireflux measures 3.   REV with Dr. Abran (per patient request) in 6 months    Patient tells me that she is doing well since her last visit.  She continues to be independent and drives.  Follow-up surveillance abdominal ultrasound was performed November 10, 2023.  No obvious hepatic lesion.  Incidental small anechoic mass of left kidney noted as well as incidental cholelithiasis.  As well, recent blood work obtained November 16, 2023.  Normal comprehensive metabolic  panel except for renal insufficiency and an alkaline phosphatase of 162.  Unremarkable CBC with hemoglobin 11.8.  Platelet count stable at 170,000.  Patient continues on pantoprazole  for GERD.  No dysphagia.  As well, she continues on ursodiol  300 mg twice daily for her PBC.  She is also on Eliquis  and several medications for her diabetes.  REVIEW OF SYSTEMS:  All non-GI ROS negative unless otherwise stated in the HPI except for arthritis, back pain, allergies  Past Medical History:  Diagnosis Date   Acquired thrombophilia (HCC) 09/11/2019   Allergic rhinitis 09/17/2015   Anemia    ANEMIA, NORMOCYTIC 10/27/2007   Qualifier: Diagnosis of  By: Kowalk CMA (AAMA), Leisha     Anticoagulant long-term use 01/08/2011   Arthritis    Benign neoplasm of colon 01/08/2011   Biliary cirrhosis (HCC)    BRADYCARDIA 11/15/2008   Qualifier: Diagnosis of  By: Rubbie CMA, Amanda     Cellulitis of left foot 09/25/2016   Cholelithiasis 01/09/12   Chronic kidney disease, stage 3 (HCC)    Combined form of age-related cataract, both eyes 02/26/2017   Diabetes mellitus type 2 without retinopathy (HCC) 10/27/2013   Diverticulosis of colon with hemorrhage 06/08/2000   Dyslipidemia 09/17/2015   Encounter for therapeutic drug monitoring 06/09/2013   Esophageal reflux 01/08/2011   Esophageal stenosis 01/08/11   Esophagitis 01/08/11   GALLSTONES 10/26/2008   Qualifier: Diagnosis of  By: Jakie MD NOLIA Alm SAUNDERS    Gastritis, erosive chronic 01/08/2011   GERD (gastroesophageal reflux disease)    Glaucoma suspect of both eyes 02/26/2017  Glaucoma suspect of both eyes 02/26/2017   Hiatal hernia 01/08/11   History of IBS 10/27/2007   Qualifier: Diagnosis of  By: Kowalk CMA (AAMA), Chick     HTN (hypertension)    Hx of adenomatous colonic polyps 01/08/11   Hyperplastic colon polyp 06/08/2000, 07/31/2003, 01/08/11   Hypertension, essential 10/27/2007   Qualifier: Diagnosis of  By: Genie CMA LEODIS), Chick     Hypertensive heart  and chronic kidney disease without heart failure, with stage 1 through stage 4 chronic kidney disease, or unspecified chronic kidney disease    Iron  deficiency anemia 01/08/2011   Left foot pain 09/25/2016   Localized edema    Long term (current) use of insulin  (HCC)    Mixed hyperlipidemia    Nuclear cataract 07/03/2011   Obesity    Obesity (BMI 30-39.9) 03/22/2012   Osteoarthritis of right thumb 09/17/2015   Osteopenia    Other iron  deficiency anemias    Other specified menopausal and perimenopausal disorders    PAF (paroxysmal atrial fibrillation) (HCC)    occurring in the setting of bradycardia and mild first degree AV block followed by Dr. Beverli   Pain in thoracic spine    Paraesophageal hiatal hernia, 3cm by EGD 03/22/2012   Pedal edema 12/13/2019   Persistent atrial fibrillation (HCC) 08/28/2019   Posterior vitreous detachment, bilateral 01/10/2016   Primary biliary cholangitis (HCC) 10/27/2007   Qualifier: Diagnosis of  By: Genie CMA (AAMA), Chick     Pure hypercholesterolemia    Retinal tear, right 07/03/2011   Shortness of breath 12/13/2019   Stage 3b chronic kidney disease (HCC) 09/11/2019   Type II or unspecified type diabetes mellitus without mention of complication, not stated as uncontrolled    Uterine fibroid 01/09/12    Past Surgical History:  Procedure Laterality Date   BREAST CYST EXCISION     left   CARDIOVERSION N/A 11/27/2017   Procedure: CARDIOVERSION;  Surgeon: Francyne Headland, MD;  Location: [facility];  Service: Cardiovascular;  Laterality: N/A;   COLONOSCOPY     INSERTION OF MESH  05/18/2012   Procedure: INSERTION OF MESH;  Surgeon: Elspeth KYM Schultze, MD;  Location: [facility];  Service: General;  Laterality: N/A;   LIVER BIOPSY  05/18/2012   Procedure: LIVER BIOPSY;  Surgeon: Elspeth KYM Schultze, MD;  Location: [facility];  Service: General;;   POLYPECTOMY     THYROIDECTOMY, PARTIAL  1980   TUBAL LIGATION  1972   VENTRAL HERNIA REPAIR  05/18/2012   Procedure: LAPAROSCOPIC  VENTRAL HERNIA;  Surgeon: Elspeth KYM Schultze, MD;  Location: [facility];  Service: General;  Laterality: N/A;  Laparoscopic Ventral Wall Hernia with Mesh    Social History Sara Hancock  reports that she has never smoked. She has never used smokeless tobacco. She reports that she does not drink alcohol and does not use drugs.  family history includes Aneurysm in her sister; Arrhythmia in an other family member; Arthritis in an other family member; Congestive Heart Failure in her sister and another family member; Diabetes in her brother and sister; Heart attack in her father; Hyperlipidemia in an other family member; Hypertension in an other family member; Kidney failure in an other family member; Stroke in her sister; Stroke (age of onset: 82) in her mother.  Allergies  Allergen Reactions   Ace Inhibitors Anaphylaxis   Capoten [Captopril] Anaphylaxis    Throat swells shut   Neomycin-Bacitracin Zn-Polymyx Rash       PHYSICAL EXAMINATION: Vital signs: BP 122/80  Pulse (!) 107   Ht 5' 2 (1.575 m)   Wt 152 lb (68.9 kg)   BMI 27.80 kg/m   Constitutional: Pleasant generally well-appearing elderly female, no acute distress Psychiatric: alert and oriented x3, cooperative Eyes: extraocular movements intact, anicteric, conjunctiva pink Mouth: oral pharynx moist, no lesions Neck: supple no lymphadenopathy Cardiovascular: heart regular rate and rhythm, no murmur Lungs: clear to auscultation bilaterally Abdomen: soft, nontender, nondistended, no obvious ascites, no peritoneal signs, normal bowel sounds, no organomegaly Rectal: Omitted Extremities: no clubbing, cyanosis, or lower extremity edema bilaterally Skin: no lesions on visible extremities Neuro: No focal deficits. No asterixis.    Assessment     1.  Primary biliary cholangitis with compensated cirrhosis.  Stable ultrasound and laboratories.  Asymptomatic 2.  GERD with history of an esophageal stricture.  Asymptomatic post  dilation on PPI.  Last upper endoscopy May 2023 3.  Personal history of adenomatous, sessile serrated colon polyps, no longer in surveillance due to age.  Last colonoscopy May 2023 4.  Cholelithiasis.  Currently asymptomatic 5.  CKD4 6.  Pulm HTN 7.  PAF on Eliquis      Recommendations    1.  Continue ursodiol  300 mg bid prescribed.  Medication risks reviewed 2.  HCC screening - RUQ US , blood work in 6 months 3.  Continue pantoprazole  40 mg every day, follow antireflux measures.  Prescribed.  Medication risk reviewed 4.  REV with Dr. Abran (per patient request) in 6 months. A total time of 30 minutes was spent preparing to see the patient, obtaining interval history, performing medically appropriate physical examination, counseling and educating the patient regarding the above listed issues, ordering laboratories, ordering x-ray study, ordering multiple medications, arranging follow-up, and documenting clinical information in the health record.

## 2024-01-10 ENCOUNTER — Other Ambulatory Visit: Payer: Self-pay | Admitting: Cardiology

## 2024-01-10 ENCOUNTER — Other Ambulatory Visit: Payer: Self-pay | Admitting: Family Medicine

## 2024-01-10 DIAGNOSIS — I480 Paroxysmal atrial fibrillation: Secondary | ICD-10-CM

## 2024-01-12 ENCOUNTER — Other Ambulatory Visit: Payer: Self-pay

## 2024-01-12 DIAGNOSIS — I480 Paroxysmal atrial fibrillation: Secondary | ICD-10-CM

## 2024-01-12 MED ORDER — APIXABAN 2.5 MG PO TABS: 2.5000 mg | ORAL_TABLET | Freq: Two times a day (BID) | ORAL | 1 refills | Status: AC

## 2024-01-12 NOTE — Telephone Encounter (Signed)
 Prescription refill request for Eliquis  received. Indication:afib Last office visit:10/24 Scr:2.77  7/25 Age: 89 Weight:68.9  kg  Prescription refilled

## 2024-01-24 DIAGNOSIS — I352 Nonrheumatic aortic (valve) stenosis with insufficiency: Secondary | ICD-10-CM | POA: Diagnosis not present

## 2024-01-24 DIAGNOSIS — K219 Gastro-esophageal reflux disease without esophagitis: Secondary | ICD-10-CM | POA: Diagnosis not present

## 2024-01-24 DIAGNOSIS — E7800 Pure hypercholesterolemia, unspecified: Secondary | ICD-10-CM | POA: Diagnosis not present

## 2024-01-24 DIAGNOSIS — Z7901 Long term (current) use of anticoagulants: Secondary | ICD-10-CM | POA: Diagnosis not present

## 2024-01-24 DIAGNOSIS — E878 Other disorders of electrolyte and fluid balance, not elsewhere classified: Secondary | ICD-10-CM | POA: Diagnosis present

## 2024-01-24 DIAGNOSIS — Z888 Allergy status to other drugs, medicaments and biological substances status: Secondary | ICD-10-CM | POA: Diagnosis not present

## 2024-01-24 DIAGNOSIS — Z794 Long term (current) use of insulin: Secondary | ICD-10-CM | POA: Diagnosis not present

## 2024-01-24 DIAGNOSIS — I480 Paroxysmal atrial fibrillation: Secondary | ICD-10-CM | POA: Diagnosis not present

## 2024-01-24 DIAGNOSIS — I4891 Unspecified atrial fibrillation: Secondary | ICD-10-CM | POA: Diagnosis not present

## 2024-01-24 DIAGNOSIS — I340 Nonrheumatic mitral (valve) insufficiency: Secondary | ICD-10-CM | POA: Diagnosis not present

## 2024-01-24 DIAGNOSIS — R778 Other specified abnormalities of plasma proteins: Secondary | ICD-10-CM | POA: Diagnosis present

## 2024-01-24 DIAGNOSIS — E11649 Type 2 diabetes mellitus with hypoglycemia without coma: Secondary | ICD-10-CM | POA: Diagnosis present

## 2024-01-24 DIAGNOSIS — E876 Hypokalemia: Secondary | ICD-10-CM | POA: Diagnosis present

## 2024-01-24 DIAGNOSIS — N179 Acute kidney failure, unspecified: Secondary | ICD-10-CM | POA: Diagnosis not present

## 2024-01-24 DIAGNOSIS — E860 Dehydration: Secondary | ICD-10-CM | POA: Diagnosis present

## 2024-01-24 DIAGNOSIS — Z79899 Other long term (current) drug therapy: Secondary | ICD-10-CM | POA: Diagnosis not present

## 2024-01-24 DIAGNOSIS — R42 Dizziness and giddiness: Secondary | ICD-10-CM | POA: Diagnosis not present

## 2024-01-24 DIAGNOSIS — R55 Syncope and collapse: Secondary | ICD-10-CM | POA: Diagnosis not present

## 2024-01-24 DIAGNOSIS — N184 Chronic kidney disease, stage 4 (severe): Secondary | ICD-10-CM | POA: Diagnosis present

## 2024-01-24 DIAGNOSIS — E1122 Type 2 diabetes mellitus with diabetic chronic kidney disease: Secondary | ICD-10-CM | POA: Diagnosis not present

## 2024-01-24 DIAGNOSIS — I130 Hypertensive heart and chronic kidney disease with heart failure and stage 1 through stage 4 chronic kidney disease, or unspecified chronic kidney disease: Secondary | ICD-10-CM | POA: Diagnosis present

## 2024-01-24 DIAGNOSIS — M1990 Unspecified osteoarthritis, unspecified site: Secondary | ICD-10-CM | POA: Diagnosis not present

## 2024-01-24 DIAGNOSIS — I361 Nonrheumatic tricuspid (valve) insufficiency: Secondary | ICD-10-CM | POA: Diagnosis not present

## 2024-01-24 DIAGNOSIS — R531 Weakness: Secondary | ICD-10-CM | POA: Diagnosis not present

## 2024-01-24 DIAGNOSIS — I509 Heart failure, unspecified: Secondary | ICD-10-CM | POA: Diagnosis not present

## 2024-01-24 DIAGNOSIS — R9431 Abnormal electrocardiogram [ECG] [EKG]: Secondary | ICD-10-CM | POA: Diagnosis not present

## 2024-01-25 DIAGNOSIS — I361 Nonrheumatic tricuspid (valve) insufficiency: Secondary | ICD-10-CM | POA: Diagnosis not present

## 2024-01-25 DIAGNOSIS — I352 Nonrheumatic aortic (valve) stenosis with insufficiency: Secondary | ICD-10-CM | POA: Diagnosis not present

## 2024-01-25 DIAGNOSIS — I340 Nonrheumatic mitral (valve) insufficiency: Secondary | ICD-10-CM | POA: Diagnosis not present

## 2024-01-27 ENCOUNTER — Telehealth: Payer: Self-pay

## 2024-01-27 NOTE — Transitions of Care (Post Inpatient/ED Visit) (Signed)
   01/27/2024  Name: AMBERLI RUEGG MRN: [id number] DOB: December 27, 1934  Today's TOC FU Call Status: Today's TOC FU Call Status:: Unsuccessful Call (1st Attempt) Unsuccessful Call (1st Attempt) Date: 01/27/24  Attempted to reach the patient regarding the most recent Inpatient/ED visit.  Follow Up Plan: Additional outreach attempts will be made to reach the patient to complete the Transitions of Care (Post Inpatient/ED visit) call.   Shona Prow RN, CCM [place]  VBC[facility]-Population Health RN Care Manager [phone]

## 2024-01-28 ENCOUNTER — Telehealth: Payer: Self-pay

## 2024-01-28 NOTE — Transitions of Care (Post Inpatient/ED Visit) (Signed)
   01/28/2024  Name: Sara Hancock MRN: [id number] DOB: 1934/11/22  Today's TOC FU Call Status: Today's TOC FU Call Status:: Unsuccessful Call (2nd Attempt) Unsuccessful Call (2nd Attempt) Date: 01/28/24  Attempted to reach the patient regarding the most recent Inpatient/ED visit.  Follow Up Plan: Additional outreach attempts will be made to reach the patient to complete the Transitions of Care (Post Inpatient/ED visit) call.   Shona Prow RN, CCM [place]  VBCI-Population Health RN Care Manager [phone]

## 2024-01-29 ENCOUNTER — Telehealth: Payer: Self-pay

## 2024-01-29 NOTE — Transitions of Care (Post Inpatient/ED Visit) (Signed)
   01/29/2024  Name: Sara Hancock MRN: [id number] DOB: 1934/11/19  Today's TOC FU Call Status:    Attempted to reach the patient regarding the most recent Inpatient/ED visit.  Follow Up Plan: No further outreach attempts will be made at this time. We have been unable to contact the patient.  Shona Prow RN, CCM [place]  VBCI-Population Health RN Care Manager [phone]

## 2024-02-03 DIAGNOSIS — D485 Neoplasm of uncertain behavior of skin: Secondary | ICD-10-CM | POA: Diagnosis not present

## 2024-02-03 DIAGNOSIS — R238 Other skin changes: Secondary | ICD-10-CM | POA: Diagnosis not present

## 2024-02-03 DIAGNOSIS — D1801 Hemangioma of skin and subcutaneous tissue: Secondary | ICD-10-CM | POA: Diagnosis not present

## 2024-02-03 DIAGNOSIS — L988 Other specified disorders of the skin and subcutaneous tissue: Secondary | ICD-10-CM | POA: Diagnosis not present

## 2024-02-03 DIAGNOSIS — Z86018 Personal history of other benign neoplasm: Secondary | ICD-10-CM | POA: Diagnosis not present

## 2024-02-03 DIAGNOSIS — Z85828 Personal history of other malignant neoplasm of skin: Secondary | ICD-10-CM | POA: Diagnosis not present

## 2024-02-03 DIAGNOSIS — D225 Melanocytic nevi of trunk: Secondary | ICD-10-CM | POA: Diagnosis not present

## 2024-02-03 DIAGNOSIS — L821 Other seborrheic keratosis: Secondary | ICD-10-CM | POA: Diagnosis not present

## 2024-02-03 DIAGNOSIS — I872 Venous insufficiency (chronic) (peripheral): Secondary | ICD-10-CM | POA: Diagnosis not present

## 2024-02-03 DIAGNOSIS — L578 Other skin changes due to chronic exposure to nonionizing radiation: Secondary | ICD-10-CM | POA: Diagnosis not present

## 2024-02-03 DIAGNOSIS — D2272 Melanocytic nevi of left lower limb, including hip: Secondary | ICD-10-CM | POA: Diagnosis not present

## 2024-02-03 DIAGNOSIS — D2230 Melanocytic nevi of unspecified part of face: Secondary | ICD-10-CM | POA: Diagnosis not present

## 2024-02-03 DIAGNOSIS — D235 Other benign neoplasm of skin of trunk: Secondary | ICD-10-CM | POA: Diagnosis not present

## 2024-02-09 ENCOUNTER — Other Ambulatory Visit: Payer: Self-pay | Admitting: Family Medicine

## 2024-02-09 DIAGNOSIS — I152 Hypertension secondary to endocrine disorders: Secondary | ICD-10-CM

## 2024-02-23 DIAGNOSIS — N184 Chronic kidney disease, stage 4 (severe): Secondary | ICD-10-CM | POA: Diagnosis not present

## 2024-02-25 ENCOUNTER — Other Ambulatory Visit

## 2024-02-29 ENCOUNTER — Other Ambulatory Visit: Payer: Self-pay

## 2024-02-29 DIAGNOSIS — E1159 Type 2 diabetes mellitus with other circulatory complications: Secondary | ICD-10-CM

## 2024-02-29 DIAGNOSIS — E782 Mixed hyperlipidemia: Secondary | ICD-10-CM

## 2024-03-01 ENCOUNTER — Ambulatory Visit: Attending: Cardiology | Admitting: Cardiology

## 2024-03-01 ENCOUNTER — Ambulatory Visit: Admitting: Family Medicine

## 2024-03-01 ENCOUNTER — Encounter: Payer: Self-pay | Admitting: Cardiology

## 2024-03-01 VITALS — BP 108/56 | HR 70 | Ht 62.0 in | Wt 152.6 lb

## 2024-03-01 DIAGNOSIS — I131 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, with stage 1 through stage 4 chronic kidney disease, or unspecified chronic kidney disease: Secondary | ICD-10-CM | POA: Insufficient documentation

## 2024-03-01 DIAGNOSIS — Z794 Long term (current) use of insulin: Secondary | ICD-10-CM | POA: Insufficient documentation

## 2024-03-01 DIAGNOSIS — I129 Hypertensive chronic kidney disease with stage 1 through stage 4 chronic kidney disease, or unspecified chronic kidney disease: Secondary | ICD-10-CM | POA: Diagnosis not present

## 2024-03-01 DIAGNOSIS — I480 Paroxysmal atrial fibrillation: Secondary | ICD-10-CM | POA: Diagnosis not present

## 2024-03-01 DIAGNOSIS — N184 Chronic kidney disease, stage 4 (severe): Secondary | ICD-10-CM | POA: Diagnosis not present

## 2024-03-01 DIAGNOSIS — E119 Type 2 diabetes mellitus without complications: Secondary | ICD-10-CM | POA: Diagnosis not present

## 2024-03-01 DIAGNOSIS — E1122 Type 2 diabetes mellitus with diabetic chronic kidney disease: Secondary | ICD-10-CM | POA: Diagnosis not present

## 2024-03-01 DIAGNOSIS — I5030 Unspecified diastolic (congestive) heart failure: Secondary | ICD-10-CM | POA: Diagnosis not present

## 2024-03-01 DIAGNOSIS — E785 Hyperlipidemia, unspecified: Secondary | ICD-10-CM | POA: Diagnosis not present

## 2024-03-01 NOTE — Progress Notes (Signed)
 Cardiology Office Note:    Date:  03/01/2024   ID:  Sara Hancock, DOB 1934/08/03, MRN [id number]  PCP:  Sherre Clapper, MD  Cardiologist:  Dub Huntsman, DO  Electrophysiologist:  None   Referring MD: Sherre Clapper, MD    I am doing well  History of Present Illness:    Sara Hancock is a 89 y.o. female with a hx of  atrial fibrillation on Coumadin  and metoprolol , diastolic heart failure, hypertension, chronic kidney disease stage IV, type 2 diabetes mellitus, GERD, cirrhosis of the liver, primary biliary cholangitis presents today for follow-up visit.    Since her last visit with me she has seen her PCP based on my review she was doing well no significant changes at this time.  She also has continue to follow-up with nephrology and has been maintained on her current dose of torsemide  20 mg twice daily.  She reported recent hospitalization at [facility] for dehydration but is doing well now.  Past Medical History:  Diagnosis Date   Acquired thrombophilia 09/11/2019   Allergic rhinitis 09/17/2015   Anemia    ANEMIA, NORMOCYTIC 10/27/2007   Qualifier: Diagnosis of  By: Kowalk CMA (AAMA), Leisha     Anticoagulant long-term use 01/08/2011   Arthritis    Benign neoplasm of colon 01/08/2011   Biliary cirrhosis (HCC)    BRADYCARDIA 11/15/2008   Qualifier: Diagnosis of  By: Rubbie CMA, Amanda     Cellulitis of left foot 09/25/2016   Cholelithiasis 01/09/12   Chronic kidney disease, stage 3 (HCC)    Combined form of age-related cataract, both eyes 02/26/2017   Diabetes mellitus type 2 without retinopathy (HCC) 10/27/2013   Diverticulosis of colon with hemorrhage 06/08/2000   Dyslipidemia 09/17/2015   Encounter for therapeutic drug monitoring 06/09/2013   Esophageal reflux 01/08/2011   Esophageal stenosis 01/08/11   Esophagitis 01/08/11   GALLSTONES 10/26/2008   Qualifier: Diagnosis of  By: Jakie MD NOLIA Alm SAUNDERS    Gastritis, erosive chronic 01/08/2011   GERD (gastroesophageal  reflux disease)    Glaucoma suspect of both eyes 02/26/2017   Glaucoma suspect of both eyes 02/26/2017   Hiatal hernia 01/08/11   History of IBS 10/27/2007   Qualifier: Diagnosis of  By: Genie CMA (AAMA), Chick     HTN (hypertension)    Hx of adenomatous colonic polyps 01/08/11   Hyperplastic colon polyp 06/08/2000, 07/31/2003, 01/08/11   Hypertension, essential 10/27/2007   Qualifier: Diagnosis of  By: Genie CMA (AAMA), Chick     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, with stage 1 through stage 4 chronic kidney disease, or unspecified chronic kidney disease    Iron  deficiency anemia 01/08/2011   Left foot pain 09/25/2016   Localized edema    Long term (current) use of insulin  (HCC)    Mixed hyperlipidemia    Nuclear cataract 07/03/2011   Obesity    Obesity (BMI 30-39.9) 03/22/2012   Osteoarthritis of right thumb 09/17/2015   Osteopenia    Other iron  deficiency anemias    Other specified menopausal and perimenopausal disorders    PAF (paroxysmal atrial fibrillation) (HCC)    occurring in the setting of bradycardia and mild first degree AV block followed by Dr. Beverli   Pain in thoracic spine    Paraesophageal hiatal hernia, 3cm by EGD 03/22/2012   Pedal edema 12/13/2019   Persistent atrial fibrillation (HCC) 08/28/2019   Posterior vitreous detachment, bilateral 01/10/2016   Primary biliary cholangitis (HCC) 10/27/2007  Qualifier: Diagnosis of  By: Genie CMA LEODIS), Chick     Pure hypercholesterolemia    Retinal tear, right 07/03/2011   Shortness of breath 12/13/2019   Stage 3b chronic kidney disease (HCC) 09/11/2019   Type II or unspecified type diabetes mellitus without mention of complication, not stated as uncontrolled    Uterine fibroid 01/09/12    Past Surgical History:  Procedure Laterality Date   BREAST CYST EXCISION     left   CARDIOVERSION N/A 11/27/2017   Procedure: CARDIOVERSION;  Surgeon: Francyne Headland, MD;  Location: [facility];  Service:  Cardiovascular;  Laterality: N/A;   COLONOSCOPY     INSERTION OF MESH  05/18/2012   Procedure: INSERTION OF MESH;  Surgeon: Elspeth KYM Schultze, MD;  Location: [facility] ORS;  Service: General;  Laterality: N/A;   LIVER BIOPSY  05/18/2012   Procedure: LIVER BIOPSY;  Surgeon: Elspeth KYM Schultze, MD;  Location: [facility] ORS;  Service: General;;   POLYPECTOMY     THYROIDECTOMY, PARTIAL  1980   TUBAL LIGATION  1972   VENTRAL HERNIA REPAIR  05/18/2012   Procedure: LAPAROSCOPIC VENTRAL HERNIA;  Surgeon: Elspeth KYM Schultze, MD;  Location: [facility] ORS;  Service: General;  Laterality: N/A;  Laparoscopic Ventral Wall Hernia with Mesh    Current Medications: Current Meds  Medication Sig   Accu-Chek FastClix Lancets MISC USE TO CHECK BLOOD SUGAR TWICE DAILY   allopurinol  (ZYLOPRIM ) 100 MG tablet TAKE ONE-HALF TABLET BY MOUTH EVERY OTHER DAY   amLODipine  (NORVASC ) 2.5 MG tablet TAKE 1 TABLET(2.5 MG) BY MOUTH DAILY   apixaban  (ELIQUIS ) 2.5 MG TABS tablet Take 1 tablet (2.5 mg total) by mouth 2 (two) times daily.   atorvastatin  (LIPITOR) 40 MG tablet TAKE 1 TABLET(40 MG) BY MOUTH DAILY   BD PEN NEEDLE NANO 2ND GEN 32G X 4 MM MISC USE A NEEDLE EACH TIME FOR INSULIN  INJECTION   calcium -vitamin D (OSCAL WITH D) 250-125 MG-UNIT per tablet Take 1 tablet by mouth 2 (two) times daily.   Cholecalciferol (VITAMIN D3) 1000 units CAPS Take 1,000 Units by mouth daily.   fexofenadine  (ALLEGRA  ALLERGY) 60 MG tablet Take 1 tablet (60 mg total) by mouth 2 (two) times daily. (Patient taking differently: Take 60 mg by mouth daily.)   fluticasone (FLONASE) 50 MCG/ACT nasal spray Place 1 spray into both nostrils as needed.   folic acid  (FOLVITE ) 400 MCG tablet Take 400 mcg by mouth daily.   glucose blood (ACCU-CHEK GUIDE) test strip USE AS INSTRUCTED TWICE DAILY   insulin  glargine (LANTUS  SOLOSTAR) 100 UNIT/ML Solostar Pen Inject 54 units under the skin every morning and 36 units with Dinner   insulin  lispro (HUMALOG ) 100 UNIT/ML KwikPen Inject 0.1 ml(10  units) into skin daily before supper.   JARDIANCE  10 MG TABS tablet TAKE 1 TABLET(10 MG) BY MOUTH DAILY BEFORE BREAKFAST   metolazone  (ZAROXOLYN ) 5 MG tablet Take 1 tablet (5 mg total) by mouth once a week.   metoprolol  tartrate (LOPRESSOR ) 100 MG tablet TAKE 1 TABLET BY MOUTH TWICE DAILY   Multiple Vitamin (MULTIVITAMIN WITH MINERALS) TABS Take 1 tablet by mouth daily.   nystatin cream (MYCOSTATIN) APPLY SUFFICIENT AMOUNT TOPICALLY TO THE AFFECTED AREA TWICE DAILY   pantoprazole  (PROTONIX ) 40 MG tablet Take 1 tablet (40 mg total) by mouth daily.   potassium chloride  SA (KLOR-CON  M) 20 MEQ tablet TAKE 1 TABLET(20 MEQ) BY MOUTH DAILY   spironolactone (ALDACTONE) 25 MG tablet Take 25 mg by mouth daily.   torsemide  (DEMADEX ) 20 MG  tablet TAKE 2 TABLETS BY MOUTH TWICE DAILY (Patient taking differently: 20 mg daily.)   ursodiol  (ACTIGALL ) 300 MG capsule TAKE 1 CAPSULE(300 MG) BY MOUTH TWICE DAILY   vitamin C  (ASCORBIC ACID ) 500 MG tablet Take 500 mg by mouth daily.   vitamin E  400 UNIT capsule Take 400 Units by mouth daily.     Allergies:   Ace inhibitors, Capoten [captopril], and Neomycin-bacitracin zn-polymyx   Social History   Socioeconomic History   Marital status: Married    Spouse name: Not on file   Number of children: 2   Years of education: Not on file   Highest education level: Not on file  Occupational History   Occupation: retired    Associate Professor: RETIRED  Tobacco Use   Smoking status: Never   Smokeless tobacco: Never  Vaping Use   Vaping status: Never Used  Substance and Sexual Activity   Alcohol use: No   Drug use: No   Sexual activity: Not Currently  Other Topics Concern   Not on file  Social History Narrative   Not on file   Social Drivers of Health   Financial Resource Strain: Low Risk  (04/23/2023)   Overall Financial Resource Strain (CARDIA)    Difficulty of Paying Living Expenses: Not hard at all  Food Insecurity: No Food Insecurity (04/23/2023)   Hunger  Vital Sign    Worried About Running Out of Food in the Last Year: Never true    Ran Out of Food in the Last Year: Never true  Transportation Needs: No Transportation Needs (04/23/2023)   PRAPARE - Administrator, Civil Service (Medical): No    Lack of Transportation (Non-Medical): No  Physical Activity: Insufficiently Active (04/23/2023)   Exercise Vital Sign    Days of Exercise per Week: 6 days    Minutes of Exercise per Session: 20 min  Stress: No Stress Concern Present (04/23/2023)   [employer] of Occupational Health - Occupational Stress Questionnaire    Feeling of Stress : Not at all  Social Connections: Moderately Integrated (04/23/2023)   Social Connection and Isolation Panel    Frequency of Communication with Friends and Family: More than three times a week    Frequency of Social Gatherings with Friends and Family: Once a week    Attends Religious Services: More than 4 times per year    Active Member of [employer] or Organizations: Yes    Attends Banker Meetings: More than 4 times per year    Marital Status: Widowed     Family History: The patient's family history includes Aneurysm in her sister; Arrhythmia in an other family member; Arthritis in an other family member; Congestive Heart Failure in her sister and another family member; Diabetes in her brother and sister; Heart attack in her father; Hyperlipidemia in an other family member; Hypertension in an other family member; Kidney failure in an other family member; Stroke in her sister; Stroke (age of onset: 47) in her mother. There is no history of Colon cancer, Esophageal cancer, Stomach cancer, or Rectal cancer.  ROS:   Review of Systems  Constitution: Negative for decreased appetite, fever and weight gain.  HENT: Negative for congestion, ear discharge, hoarse voice and sore throat.   Eyes: Negative for discharge, redness, vision loss in right eye and visual halos.  Cardiovascular: Negative  for chest pain, dyspnea on exertion, leg swelling, orthopnea and palpitations.  Respiratory: Negative for cough, hemoptysis, shortness of breath and snoring.   Endocrine:  Negative for heat intolerance and polyphagia.  Hematologic/Lymphatic: Negative for bleeding problem. Does not bruise/bleed easily.  Skin: Negative for flushing, nail changes, rash and suspicious lesions.  Musculoskeletal: Negative for arthritis, joint pain, muscle cramps, myalgias, neck pain and stiffness.  Gastrointestinal: Negative for abdominal pain, bowel incontinence, diarrhea and excessive appetite.  Genitourinary: Negative for decreased libido, genital sores and incomplete emptying.  Neurological: Negative for brief paralysis, focal weakness, headaches and loss of balance.  Psychiatric/Behavioral: Negative for altered mental status, depression and suicidal ideas.  Allergic/Immunologic: Negative for HIV exposure and persistent infections.    EKGs/Labs/Other Studies Reviewed:    The following studies were reviewed today:   EKG: atrial fibrillation with 90s beats per minute.  Recent Labs: 11/16/2023: ALT 16; BUN 88; Creatinine, Ser 2.77; Hemoglobin 11.8; Platelets 170; Potassium 3.7; Sodium 144; TSH 2.150  Recent Lipid Panel    Component Value Date/Time   CHOL 116 11/16/2023 0000   TRIG 77 11/16/2023 0000   HDL 45 11/16/2023 0000   CHOLHDL 2.6 11/16/2023 0000   LDLCALC 55 11/16/2023 0000    Physical Exam:    VS:  BP (!) 108/56 (BP Location: Left Arm, Patient Position: Sitting, Cuff Size: Normal)   Pulse 70   Ht 5' 2 (1.575 m)   Wt 152 lb 9.6 oz (69.2 kg)   SpO2 96%   BMI 27.91 kg/m     Wt Readings from Last 3 Encounters:  03/01/24 152 lb 9.6 oz (69.2 kg)  12/09/23 152 lb (68.9 kg)  11/23/23 151 lb (68.5 kg)     GEN: Well nourished, well developed in no acute distress HEENT: Normal NECK: No JVD; No carotid bruits LYMPHATICS: No lymphadenopathy CARDIAC: S1S2 noted,RRR, no murmurs, rubs,  gallops RESPIRATORY:  Clear to auscultation without rales, wheezing or rhonchi  ABDOMEN: Soft, non-tender, non-distended, +bowel sounds, no guarding. EXTREMITIES: No edema, No cyanosis, no clubbing MUSCULOSKELETAL:  No deformity  SKIN: Warm and dry NEUROLOGIC:  Alert and oriented x 3, non-focal PSYCHIATRIC:  Normal affect, good insight  ASSESSMENT:    1. PAF (paroxysmal atrial fibrillation) (HCC)   2. 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, with stage 1 through stage 4 chronic kidney disease, or unspecified chronic kidney disease   3. Insulin -requiring or dependent type II diabetes mellitus (HCC)   4. CKD stage 4 due to type 2 diabetes mellitus (HCC)    PLAN:    Clinically she appears to be euvolemic.  She is now taking the torsemide  20 mg twice a day.  We will keep her on that.  She will be following with Nephrology today.   Permanent atrial fibrillation-continue current medication regimen.  Chronic kidney disease she is continue to monitor creatinine.  Blood pressure is acceptable, continue with current antihypertensive regimen.  This is being managed by his primary care doctor.  No adjustments for antidiabetic medications were made today.    The patient is in agreement with the above plan. The patient left the office in stable condition.  The patient will follow up in 12 months or sooner if needed.   Medication Adjustments/Labs and Tests Ordered: Current medicines are reviewed at length with the patient today.  Concerns regarding medicines are outlined above.  Orders Placed This Encounter  Procedures   EKG 12-Lead   No orders of the defined types were placed in this encounter.   Patient Instructions  Medication Instructions:  Your physician recommends that you continue on your current medications as directed. Please refer to the Current  Medication list given to you today.  *If you need a refill on your cardiac medications before your next  appointment, please call your pharmacy*   Follow-Up: At [facility], you and your health needs are our priority.  As part of our continuing mission to provide you with exceptional heart care, our providers are all part of one team.  This team includes your primary Cardiologist (physician) and Advanced Practice Providers or APPs (Physician Assistants and Nurse Practitioners) who all work together to provide you with the care you need, when you need it.  Your next appointment:   1 year(s)  Provider:   Rozanna Cormany, DO             Adopting a Healthy Lifestyle.  Know what a healthy weight is for you (roughly BMI <25) and aim to maintain this   Aim for 7+ servings of fruits and vegetables daily   65-80+ fluid ounces of water  or unsweet tea for healthy kidneys   Limit to max 1 drink of alcohol per day; avoid smoking/tobacco   Limit animal fats in diet for cholesterol and heart health - choose grass fed whenever available   Avoid highly processed foods, and foods high in saturated/trans fats   Aim for low stress - take time to unwind and care for your mental health   Aim for 150 min of moderate intensity exercise weekly for heart health, and weights twice weekly for bone health   Aim for 7-9 hours of sleep daily   When it comes to diets, agreement about the perfect plan isnt easy to find, even among the experts. Experts at the [facility] of [employer] developed an idea known as the Healthy Eating Plate. Just imagine a plate divided into logical, healthy portions.   The emphasis is on diet quality:   Load up on vegetables and fruits - one-half of your plate: Aim for color and variety, and remember that potatoes dont count.   Go for whole grains - one-quarter of your plate: Whole wheat, barley, wheat berries, quinoa, oats, brown rice, and foods made with them. If you want pasta, go with whole wheat pasta.   Protein power - one-quarter of your plate: Fish,  chicken, beans, and nuts are all healthy, versatile protein sources. Limit red meat.   The diet, however, does go beyond the plate, offering a few other suggestions.   Use healthy plant oils, such as olive, canola, soy, corn, sunflower and peanut. Check the labels, and avoid partially hydrogenated oil, which have unhealthy trans fats.   If youre thirsty, drink water . Coffee and tea are good in moderation, but skip sugary drinks and limit milk and dairy products to one or two daily servings.   The type of carbohydrate in the diet is more important than the amount. Some sources of carbohydrates, such as vegetables, fruits, whole grains, and beans-are healthier than others.   Finally, stay active  Signed, Dub Huntsman, DO  03/01/2024 4:59 PM    [facility]

## 2024-03-01 NOTE — Patient Instructions (Signed)

## 2024-03-02 ENCOUNTER — Other Ambulatory Visit: Payer: Self-pay | Admitting: Cardiology

## 2024-03-02 ENCOUNTER — Other Ambulatory Visit

## 2024-03-02 DIAGNOSIS — I152 Hypertension secondary to endocrine disorders: Secondary | ICD-10-CM | POA: Diagnosis not present

## 2024-03-02 DIAGNOSIS — E782 Mixed hyperlipidemia: Secondary | ICD-10-CM

## 2024-03-02 DIAGNOSIS — E1159 Type 2 diabetes mellitus with other circulatory complications: Secondary | ICD-10-CM

## 2024-03-02 LAB — LIPID PANEL
Chol/HDL Ratio: 2.1 ratio (ref 0.0–4.4)
Cholesterol, Total: 105 mg/dL (ref 100–199)
HDL: 49 mg/dL (ref >39)
LDL Chol Calc (NIH): 42 mg/dL (ref 0–99)
Triglycerides: 66 mg/dL (ref 0–149)
VLDL Cholesterol Cal: 14 mg/dL (ref 5–40)

## 2024-03-02 LAB — CBC WITH DIFFERENTIAL/PLATELET
Basophils Absolute: 0 x10E3/uL (ref 0.0–0.2)
Basos: 0 %
EOS (ABSOLUTE): 0 x10E3/uL (ref 0.0–0.4)
Eos: 1 %
Hematocrit: 34.7 % (ref 34.0–46.6)
Hemoglobin: 11.1 g/dL (ref 11.1–15.9)
Immature Grans (Abs): 0 x10E3/uL (ref 0.0–0.1)
Immature Granulocytes: 0 %
Lymphocytes Absolute: 1.3 x10E3/uL (ref 0.7–3.1)
Lymphs: 18 %
MCH: 32.7 pg (ref 26.6–33.0)
MCHC: 32 g/dL (ref 31.5–35.7)
MCV: 102 fL — ABNORMAL HIGH (ref 79–97)
Monocytes Absolute: 0.9 x10E3/uL (ref 0.1–0.9)
Monocytes: 13 %
Neutrophils Absolute: 5 x10E3/uL (ref 1.4–7.0)
Neutrophils: 68 %
Platelets: 151 x10E3/uL (ref 150–450)
RBC: 3.39 x10E6/uL — ABNORMAL LOW (ref 3.77–5.28)
RDW: 14.3 % (ref 11.7–15.4)
WBC: 7.3 x10E3/uL (ref 3.4–10.8)

## 2024-03-02 LAB — COMPREHENSIVE METABOLIC PANEL WITH GFR
ALT: 11 IU/L (ref 0–32)
AST: 21 IU/L (ref 0–40)
Albumin: 3.7 g/dL (ref 3.7–4.7)
Alkaline Phosphatase: 146 IU/L — ABNORMAL HIGH (ref 48–129)
BUN/Creatinine Ratio: 35 — ABNORMAL HIGH (ref 12–28)
BUN: 87 mg/dL (ref 8–27)
Bilirubin Total: 0.4 mg/dL (ref 0.0–1.2)
CO2: 27 mmol/L (ref 20–29)
Calcium: 9.5 mg/dL (ref 8.7–10.3)
Chloride: 93 mmol/L — ABNORMAL LOW (ref 96–106)
Creatinine, Ser: 2.48 mg/dL — ABNORMAL HIGH (ref 0.57–1.00)
Globulin, Total: 3.2 g/dL (ref 1.5–4.5)
Glucose: 90 mg/dL (ref 70–99)
Potassium: 3.6 mmol/L (ref 3.5–5.2)
Sodium: 139 mmol/L (ref 134–144)
Total Protein: 6.9 g/dL (ref 6.0–8.5)
eGFR: 18 mL/min/1.73 — ABNORMAL LOW (ref >59)

## 2024-03-02 LAB — HEMOGLOBIN A1C
Est. average glucose Bld gHb Est-mCnc: 183 mg/dL
Hgb A1c MFr Bld: 8 % — ABNORMAL HIGH (ref 4.8–5.6)

## 2024-03-03 ENCOUNTER — Other Ambulatory Visit

## 2024-03-04 ENCOUNTER — Telehealth: Payer: Self-pay | Admitting: Cardiology

## 2024-03-04 NOTE — Telephone Encounter (Signed)
*  STAT* If patient is at the pharmacy, call can be transferred to refill team.   1. Which medications need to be refilled? (please list name of each medication and dose if known) metolazone  (ZAROXOLYN ) 5 MG tablet   Take 1 tablet (5 mg total) by mouth once a week.    2. Would you like to learn more about the convenience, safety, & potential cost savings by using the [facility]? No   3. Are you open to using the [facility] No   4. Which pharmacy/location (including street and city if local pharmacy) is medication to be sent to? [id number] - ,  - [address] AT [facility] [address]     5. Do they need a 30 day or 90 day supply? 90 Day Supply  Pt is currently out of medication

## 2024-03-05 ENCOUNTER — Other Ambulatory Visit: Payer: Self-pay | Admitting: Cardiology

## 2024-03-07 ENCOUNTER — Ambulatory Visit (INDEPENDENT_AMBULATORY_CARE_PROVIDER_SITE_OTHER): Admitting: Family Medicine

## 2024-03-07 ENCOUNTER — Other Ambulatory Visit: Payer: Self-pay

## 2024-03-07 ENCOUNTER — Encounter: Payer: Self-pay | Admitting: Family Medicine

## 2024-03-07 VITALS — BP 132/72 | HR 82 | Temp 97.9°F | Resp 18 | Ht 62.0 in | Wt 153.8 lb

## 2024-03-07 DIAGNOSIS — E119 Type 2 diabetes mellitus without complications: Secondary | ICD-10-CM | POA: Diagnosis not present

## 2024-03-07 DIAGNOSIS — K743 Primary biliary cirrhosis: Secondary | ICD-10-CM | POA: Diagnosis not present

## 2024-03-07 DIAGNOSIS — J301 Allergic rhinitis due to pollen: Secondary | ICD-10-CM | POA: Diagnosis not present

## 2024-03-07 DIAGNOSIS — K219 Gastro-esophageal reflux disease without esophagitis: Secondary | ICD-10-CM

## 2024-03-07 DIAGNOSIS — E782 Mixed hyperlipidemia: Secondary | ICD-10-CM | POA: Diagnosis not present

## 2024-03-07 DIAGNOSIS — Z23 Encounter for immunization: Secondary | ICD-10-CM

## 2024-03-07 DIAGNOSIS — I131 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, with stage 1 through stage 4 chronic kidney disease, or unspecified chronic kidney disease: Secondary | ICD-10-CM | POA: Diagnosis not present

## 2024-03-07 DIAGNOSIS — I350 Nonrheumatic aortic (valve) stenosis: Secondary | ICD-10-CM

## 2024-03-07 DIAGNOSIS — M109 Gout, unspecified: Secondary | ICD-10-CM | POA: Diagnosis not present

## 2024-03-07 MED ORDER — METOLAZONE 5 MG PO TABS: 5.0000 mg | ORAL_TABLET | ORAL | 3 refills | Status: AC

## 2024-03-07 MED ORDER — EMPAGLIFLOZIN 25 MG PO TABS: 25.0000 mg | ORAL_TABLET | Freq: Every day | ORAL | 1 refills | Status: AC

## 2024-03-07 NOTE — Progress Notes (Signed)
 "  Subjective:  Patient ID: Sara Hancock, female    DOB: [birth year]  Age: 89 y.o. MRN: [id number]  Chief Complaint  Patient presents with   Medical Management of Chronic Issues    HPI: Discussed the use of AI scribe software for clinical note transcription with the patient, who gave verbal consent to proceed.  History of Present Illness Sara Hancock is an 89 year old female with chronic kidney disease stage four who presents for follow-up of her diabetes management.  Glycemic control - Hemoglobin A1c increased to 8.0 from 7.2 on recent laboratory testing - Daily blood glucose monitoring: morning readings 80s to 104 mg/dL, afternoon readings 869 to 148 mg/dL - Current regimen: Lantus  insulin  54 units in the morning and 36 units before supper, Jardiance  10 mg daily - Not using short-acting insulin  due to preference to avoid additional injections  Chronic kidney disease and electrolyte management - Chronic kidney disease stage 4 with persistently abnormal renal function, though recent labs showed slight improvement - Recent hospitalization for dehydration; potassium was low during admission but normalized after treatment - Potassium chloride  20 mEq daily for potassium supplementation  Cardiovascular and anticoagulation management - Recent two-day hospitalization at [facility] for dehydration and cardiac evaluation - Current medications: metoprolol  tartrate 100 mg twice daily, amlodipine  2.5 mg daily, Eliquis  2.5 mg twice daily, atorvastatin  40 mg daily  Volume status and diuretic use - Torsemide  two tablets twice daily and spironolactone 25 mg daily for volume management - Metolazone  as needed before torsemide   Hepatobiliary disease - Ursodiol  for primary cholangitis  Gout management - Allopurinol  100 mg every other day  Gastrointestinal symptoms and management - Pantoprazole  40 mg daily  Allergic rhinitis - Sinus congestion present - Allegra  daily and Flonase  as needed  Nutritional supplementation - Vitamins D3, E, and C  Constitutional and neurological symptoms - No fevers, chills, sweats, chest pain, breathing problems, bowel or bladder issues, or numbness in feet       04/23/2023   11:06 AM 12/25/2022   11:09 AM 05/22/2022   10:57 AM 01/08/2022    9:49 AM 05/14/2021    2:07 PM  Depression screen PHQ 2/9  Decreased Interest 0 0 0 0 0  Down, Depressed, Hopeless 0 0 0 0 0  PHQ - 2 Score 0 0 0 0 0        04/23/2023   10:50 AM  Fall Risk   Falls in the past year? 0  Number falls in past yr: 0  Injury with Fall? 0  Risk for fall due to : Impaired mobility  Follow up Education provided    Patient Care Team: Sherre Clapper, MD as PCP - General (Family Medicine) Tobb, Kardie, DO as PCP - Cardiology (Cardiology) Jakie Alm SAUNDERS, MD as Consulting Physician (Gastroenterology) Fernande Elspeth BROCKS, MD (Inactive) as Consulting Physician (Cardiology) Aneita Gwendlyn DASEN, MD (Inactive) as Consulting Physician (Gastroenterology) Macel Jayson PARAS, MD as Consulting Physician (Internal Medicine)   Review of Systems  All other systems reviewed and are negative.   Current Outpatient Medications on File Prior to Visit  Medication Sig Dispense Refill   Accu-Chek FastClix Lancets MISC USE TO CHECK BLOOD SUGAR TWICE DAILY 204 each 1   allopurinol  (ZYLOPRIM ) 100 MG tablet TAKE ONE-HALF TABLET BY MOUTH EVERY OTHER DAY 22 tablet 2   amLODipine  (NORVASC ) 2.5 MG tablet TAKE 1 TABLET(2.5 MG) BY MOUTH DAILY 90 tablet 1   apixaban  (ELIQUIS ) 2.5 MG TABS tablet Take 1 tablet (2.5 mg  total) by mouth 2 (two) times daily. 180 tablet 1   atorvastatin  (LIPITOR) 40 MG tablet TAKE 1 TABLET(40 MG) BY MOUTH DAILY 90 tablet 0   BD PEN NEEDLE NANO 2ND GEN 32G X 4 MM MISC USE A NEEDLE EACH TIME FOR INSULIN  INJECTION 200 each 1   calcium -vitamin D (OSCAL WITH D) 250-125 MG-UNIT per tablet Take 1 tablet by mouth 2 (two) times daily.     Cholecalciferol (VITAMIN D3) 1000 units  CAPS Take 1,000 Units by mouth daily.     fexofenadine  (ALLEGRA  ALLERGY) 60 MG tablet Take 1 tablet (60 mg total) by mouth 2 (two) times daily. (Patient taking differently: Take 60 mg by mouth daily.) 180 tablet 1   fluticasone (FLONASE) 50 MCG/ACT nasal spray Place 1 spray into both nostrils as needed.     folic acid  (FOLVITE ) 400 MCG tablet Take 400 mcg by mouth daily.     glucose blood (ACCU-CHEK GUIDE) test strip USE AS INSTRUCTED TWICE DAILY 200 strip 3   insulin  glargine (LANTUS  SOLOSTAR) 100 UNIT/ML Solostar Pen Inject 54 units under the skin every morning and 36 units with Dinner 15 mL PRN   metoprolol  tartrate (LOPRESSOR ) 100 MG tablet TAKE 1 TABLET BY MOUTH TWICE DAILY 180 tablet 0   Multiple Vitamin (MULTIVITAMIN WITH MINERALS) TABS Take 1 tablet by mouth daily.     nystatin cream (MYCOSTATIN) APPLY SUFFICIENT AMOUNT TOPICALLY TO THE AFFECTED AREA TWICE DAILY 30 g 1   pantoprazole  (PROTONIX ) 40 MG tablet Take 1 tablet (40 mg total) by mouth daily. 90 tablet 3   potassium chloride  SA (KLOR-CON  M) 20 MEQ tablet TAKE 1 TABLET(20 MEQ) BY MOUTH DAILY 90 tablet 0   spironolactone (ALDACTONE) 25 MG tablet Take 25 mg by mouth daily.     torsemide  (DEMADEX ) 20 MG tablet TAKE 2 TABLETS BY MOUTH TWICE DAILY (Patient taking differently: 20 mg daily.) 360 tablet 0   ursodiol  (ACTIGALL ) 300 MG capsule TAKE 1 CAPSULE(300 MG) BY MOUTH TWICE DAILY 180 capsule 3   vitamin C  (ASCORBIC ACID ) 500 MG tablet Take 500 mg by mouth daily.     vitamin E  400 UNIT capsule Take 400 Units by mouth daily.     No current facility-administered medications on file prior to visit.   Past Medical History:  Diagnosis Date   Acquired thrombophilia 09/11/2019   Allergic rhinitis 09/17/2015   Anemia    ANEMIA, NORMOCYTIC 10/27/2007   Qualifier: Diagnosis of  By: Kowalk CMA (AAMA), Leisha     Anticoagulant long-term use 01/08/2011   Arthritis    Benign neoplasm of colon 01/08/2011   Biliary cirrhosis (HCC)    BRADYCARDIA  11/15/2008   Qualifier: Diagnosis of  By: Rubbie CMA, Amanda     Cellulitis of left foot 09/25/2016   Cholelithiasis 01/09/12   Chronic kidney disease, stage 3 (HCC)    Combined form of age-related cataract, both eyes 02/26/2017   Diabetes mellitus type 2 without retinopathy (HCC) 10/27/2013   Diverticulosis of colon with hemorrhage 06/08/2000   Dyslipidemia 09/17/2015   Encounter for therapeutic drug monitoring 06/09/2013   Esophageal reflux 01/08/2011   Esophageal stenosis 01/08/11   Esophagitis 01/08/11   GALLSTONES 10/26/2008   Qualifier: Diagnosis of  By: Jakie MD NOLIA Alm SAUNDERS    Gastritis, erosive chronic 01/08/2011   GERD (gastroesophageal reflux disease)    Glaucoma suspect of both eyes 02/26/2017   Glaucoma suspect of both eyes 02/26/2017   Hiatal hernia 01/08/11   History of IBS 10/27/2007  Qualifier: Diagnosis of  By: Kowalk CMA (AAMA), Leisha     HTN (hypertension)    Hx of adenomatous colonic polyps 01/08/11   Hyperplastic colon polyp 06/08/2000, 07/31/2003, 01/08/11   Hypertension, essential 10/27/2007   Qualifier: Diagnosis of  By: Genie CMA LEODIS), Chick     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, with stage 1 through stage 4 chronic kidney disease, or unspecified chronic kidney disease    Iron  deficiency anemia 01/08/2011   Left foot pain 09/25/2016   Localized edema    Long term (current) use of insulin  (HCC)    Mixed hyperlipidemia    Nuclear cataract 07/03/2011   Obesity    Obesity (BMI 30-39.9) 03/22/2012   Osteoarthritis of right thumb 09/17/2015   Osteopenia    Other iron  deficiency anemias    Other specified menopausal and perimenopausal disorders    PAF (paroxysmal atrial fibrillation) (HCC)    occurring in the setting of bradycardia and mild first degree AV block followed by Dr. Beverli   Pain in thoracic spine    Paraesophageal hiatal hernia, 3cm by EGD 03/22/2012   Pedal edema 12/13/2019   Persistent atrial fibrillation (HCC) 08/28/2019    Posterior vitreous detachment, bilateral 01/10/2016   Primary biliary cholangitis (HCC) 10/27/2007   Qualifier: Diagnosis of  By: Genie CMA (AAMA), Chick     Pure hypercholesterolemia    Retinal tear, right 07/03/2011   Shortness of breath 12/13/2019   Stage 3b chronic kidney disease (HCC) 09/11/2019   Type II or unspecified type diabetes mellitus without mention of complication, not stated as uncontrolled    Uterine fibroid 01/09/12   Past Surgical History:  Procedure Laterality Date   BREAST CYST EXCISION     left   CARDIOVERSION N/A 11/27/2017   Procedure: CARDIOVERSION;  Surgeon: Francyne Headland, MD;  Location: [facility];  Service: Cardiovascular;  Laterality: N/A;   COLONOSCOPY     INSERTION OF MESH  05/18/2012   Procedure: INSERTION OF MESH;  Surgeon: Elspeth KYM Schultze, MD;  Location: [facility];  Service: General;  Laterality: N/A;   LIVER BIOPSY  05/18/2012   Procedure: LIVER BIOPSY;  Surgeon: Elspeth KYM Schultze, MD;  Location: [facility];  Service: General;;   POLYPECTOMY     THYROIDECTOMY, PARTIAL  1980   TUBAL LIGATION  1972   VENTRAL HERNIA REPAIR  05/18/2012   Procedure: LAPAROSCOPIC VENTRAL HERNIA;  Surgeon: Elspeth KYM Schultze, MD;  Location: [facility];  Service: General;  Laterality: N/A;  Laparoscopic Ventral Wall Hernia with Mesh    Family History  Problem Relation Age of Onset   Stroke Mother 82   Heart attack Father    Diabetes Sister        x 2   Aneurysm Sister    Stroke Sister    Congestive Heart Failure Sister    Diabetes Brother    Kidney failure Other    Hyperlipidemia Other    Arrhythmia Other    Congestive Heart Failure Other    Hypertension Other    Arthritis Other    Colon cancer Neg Hx    Esophageal cancer Neg Hx    Stomach cancer Neg Hx    Rectal cancer Neg Hx    Social History   Socioeconomic History   Marital status: Married    Spouse name: Not on file   Number of children: 2   Years of education: Not on file   Highest education level: Not on file   Occupational History  Occupation: retired    Associate Professor: RETIRED  Tobacco Use   Smoking status: Never   Smokeless tobacco: Never  Vaping Use   Vaping status: Never Used  Substance and Sexual Activity   Alcohol use: No   Drug use: No   Sexual activity: Not Currently  Other Topics Concern   Not on file  Social History Narrative   Not on file   Social Drivers of Health   Financial Resource Strain: Low Risk  (04/23/2023)   Overall Financial Resource Strain (CARDIA)    Difficulty of Paying Living Expenses: Not hard at all  Food Insecurity: No Food Insecurity (03/07/2024)   Hunger Vital Sign    Worried About Running Out of Food in the Last Year: Never true    Ran Out of Food in the Last Year: Never true  Transportation Needs: No Transportation Needs (03/07/2024)   PRAPARE - Administrator, Civil Service (Medical): No    Lack of Transportation (Non-Medical): No  Physical Activity: Insufficiently Active (04/23/2023)   Exercise Vital Sign    Days of Exercise per Week: 6 days    Minutes of Exercise per Session: 20 min  Stress: No Stress Concern Present (04/23/2023)   [employer] of Occupational Health - Occupational Stress Questionnaire    Feeling of Stress : Not at all  Social Connections: Moderately Integrated (04/23/2023)   Social Connection and Isolation Panel    Frequency of Communication with Friends and Family: More than three times a week    Frequency of Social Gatherings with Friends and Family: Once a week    Attends Religious Services: More than 4 times per year    Active Member of [employer] or Organizations: Yes    Attends Banker Meetings: More than 4 times per year    Marital Status: Widowed    Objective:  BP 132/72   Pulse 82   Temp 97.9 F (36.6 C) (Temporal)   Resp 18   Ht 5' 2 (1.575 m)   Wt 153 lb 12.8 oz (69.8 kg)   SpO2 95%   BMI 28.13 kg/m      03/07/2024    1:25 PM 03/01/2024   11:54 AM 12/09/2023   10:16 AM   BP/Weight  Systolic BP 132 108 122  Diastolic BP 72 56 80  Wt. (Lbs) 153.8 152.6 152  BMI 28.13 kg/m2 27.91 kg/m2 27.8 kg/m2    Physical Exam Vitals reviewed.  Constitutional:      Appearance: Normal appearance.  Neck:     Vascular: No carotid bruit.  Cardiovascular:     Rate and Rhythm: Normal rate. Rhythm irregular.     Pulses: Normal pulses.     Heart sounds: Normal heart sounds.  Pulmonary:     Effort: Pulmonary effort is normal. No respiratory distress.     Breath sounds: Normal breath sounds.  Abdominal:     General: Abdomen is flat. Bowel sounds are normal.     Palpations: Abdomen is soft.     Tenderness: There is no abdominal tenderness.  Neurological:     Mental Status: She is alert and oriented to person, place, and time.  Psychiatric:        Mood and Affect: Mood normal.        Behavior: Behavior normal.      Diabetic foot exam was performed with the following findings:   No deformities, ulcerations, or other skin breakdown Normal sensation of 10g monofilament Intact posterior tibialis and dorsalis pedis pulses  Lab Results  Component Value Date   WBC 7.3 03/02/2024   HGB 11.1 03/02/2024   HCT 34.7 03/02/2024   PLT 151 03/02/2024   GLUCOSE 90 03/02/2024   CHOL 105 03/02/2024   TRIG 66 03/02/2024   HDL 49 03/02/2024   LDLCALC 42 03/02/2024   ALT 11 03/02/2024   AST 21 03/02/2024   NA 139 03/02/2024   K 3.6 03/02/2024   CL 93 (L) 03/02/2024   CREATININE 2.48 (H) 03/02/2024   BUN 87 (HH) 03/02/2024   CO2 27 03/02/2024   TSH 2.150 11/16/2023   INR 1.2 (H) 02/24/2022   HGBA1C 8.0 (H) 03/02/2024    Results for orders placed or performed in visit on 03/02/24  Lipid panel   Collection Time: 03/02/24  9:30 AM  Result Value Ref Range   Cholesterol, Total 105 100 - 199 mg/dL   Triglycerides 66 0 - 149 mg/dL   HDL 49 >60 mg/dL   VLDL Cholesterol Cal 14 5 - 40 mg/dL   LDL Chol Calc (NIH) 42 0 - 99 mg/dL   Chol/HDL Ratio 2.1 0.0 - 4.4 ratio   Comprehensive metabolic panel with GFR   Collection Time: 03/02/24  9:30 AM  Result Value Ref Range   Glucose 90 70 - 99 mg/dL   BUN 87 (HH) 8 - 27 mg/dL   Creatinine, Ser 7.51 (H) 0.57 - 1.00 mg/dL   eGFR 18 (L) >40 fO/fpw/8.26   BUN/Creatinine Ratio 35 (H) 12 - 28   Sodium 139 134 - 144 mmol/L   Potassium 3.6 3.5 - 5.2 mmol/L   Chloride 93 (L) 96 - 106 mmol/L   CO2 27 20 - 29 mmol/L   Calcium  9.5 8.7 - 10.3 mg/dL   Total Protein 6.9 6.0 - 8.5 g/dL   Albumin 3.7 3.7 - 4.7 g/dL   Globulin, Total 3.2 1.5 - 4.5 g/dL   Bilirubin Total 0.4 0.0 - 1.2 mg/dL   Alkaline Phosphatase 146 (H) 48 - 129 IU/L   AST 21 0 - 40 IU/L   ALT 11 0 - 32 IU/L  Hemoglobin A1c   Collection Time: 03/02/24  9:30 AM  Result Value Ref Range   Hgb A1c MFr Bld 8.0 (H) 4.8 - 5.6 %   Est. average glucose Bld gHb Est-mCnc 183 mg/dL  CBC with Differential/Platelet   Collection Time: 03/02/24  9:30 AM  Result Value Ref Range   WBC 7.3 3.4 - 10.8 x10E3/uL   RBC 3.39 (L) 3.77 - 5.28 x10E6/uL   Hemoglobin 11.1 11.1 - 15.9 g/dL   Hematocrit 65.2 65.9 - 46.6 %   MCV 102 (H) 79 - 97 fL   MCH 32.7 26.6 - 33.0 pg   MCHC 32.0 31.5 - 35.7 g/dL   RDW 85.6 88.2 - 84.5 %   Platelets 151 150 - 450 x10E3/uL   Neutrophils 68 Not Estab. %   Lymphs 18 Not Estab. %   Monocytes 13 Not Estab. %   Eos 1 Not Estab. %   Basos 0 Not Estab. %   Neutrophils Absolute 5.0 1.4 - 7.0 x10E3/uL   Lymphocytes Absolute 1.3 0.7 - 3.1 x10E3/uL   Monocytes Absolute 0.9 0.1 - 0.9 x10E3/uL   EOS (ABSOLUTE) 0.0 0.0 - 0.4 x10E3/uL   Basophils Absolute 0.0 0.0 - 0.2 x10E3/uL   Immature Granulocytes 0 Not Estab. %   Immature Grans (Abs) 0.0 0.0 - 0.1 x10E3/uL  .  Assessment & Plan:   Assessment & Plan Diabetes mellitus type 2  without retinopathy (HCC) A1c increased to 8.0%. Blood glucose generally within target range. Avoid hypoglycemia in older patients. - Increase Lantus  to 54 units in the morning and 40 units before supper. -  Increase jardiance  to 25 mg daily. I consulted with Dr. Macel, her nephrologist, who agreed this was acceptable in light of her ckd.  - Check feet daily for sores. Orders:   empagliflozin  (JARDIANCE ) 25 MG TABS tablet; Take 1 tablet (25 mg total) by mouth daily.  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 Kidney function impaired.    Primary biliary cholangitis (HCC) Management per specialist.  The current medical regimen is effective;  continue present plan and medications.      Gastroesophageal reflux disease without esophagitis Managed with pantoprazole .    Mixed hyperlipidemia Cholesterol well-controlled with atorvastatin .    Acute gout of left foot, unspecified cause Managed with allopurinol , 100 mg every other day.    Non-seasonal allergic rhinitis due to pollen Managed with Allegra  and Flonase as needed.    Encounter for immunization  Orders:   Pfizer Comirnaty Covid-19 Vaccine 33yrs & older  Moderate aortic stenosis Noted on recent echo.  Defer management to cardiology.      Body mass index is 28.13 kg/m.    Meds ordered this encounter  Medications   empagliflozin  (JARDIANCE ) 25 MG TABS tablet    Sig: Take 1 tablet (25 mg total) by mouth daily.    Dispense:  90 tablet    Refill:  1    Orders Placed This Sport And Exercise Psychologist Vaccine 73yrs & older     I,Marla I Leal-Borjas,acting as a scribe for Abigail Free, MD.,have documented all relevant documentation on the behalf of Abigail Free, MD,as directed by  Abigail Free, MD while in the presence of Abigail Free, MD.   Follow-up: No follow-ups on file.  An After Visit Summary was printed and given to the patient.  Abigail Free, MD [facility] [phone] "

## 2024-03-07 NOTE — Patient Instructions (Signed)
  VISIT SUMMARY: Today, we reviewed your diabetes management, kidney function, and other ongoing health issues. Your A1c has increased, and we have adjusted your insulin  dosage. We also discussed your chronic kidney disease and other medications.  YOUR PLAN: TYPE 2 DIABETES MELLITUS: Your A1c has increased to 8.0%, and your blood glucose levels are generally within the target range. -Increase Lantus  to 54 units in the morning and 40 units before supper. -Consult with Dr. Dalene about whether increasing Jardiance  to 25 mg daily is appropriate due to your chronic kidney disease. -Check your feet daily for sores.  CHRONIC KIDNEY DISEASE STAGE 4: Your kidney function is impaired, and we need to be cautious about increasing your Jardiance  dose. -Consult with Dr. Dalene about whether increasing Jardiance  dose is appropriate.  HYPERTENSION AND ATRIAL FIBRILLATION: Your blood pressure and heart rhythm are managed with your current medications. -Continue taking metoprolol , amlodipine , and Eliquis  as prescribed.  PRIMARY BILIARY CHOLANGITIS: This condition is managed with ursodiol . -Continue taking ursodiol  as prescribed.  GOUT: Your gout is managed with allopurinol . -Continue taking allopurinol , 100 mg every other day.  HYPERLIPIDEMIA: Your cholesterol levels are well-controlled with atorvastatin . -Continue taking atorvastatin  as prescribed.  ALLERGIC RHINITIS: You have sinus congestion managed with Allegra  and Flonase. -Continue taking Allegra  daily and use Flonase as needed.  GASTROESOPHAGEAL REFLUX DISEASE: Your reflux is managed with pantoprazole . -Continue taking pantoprazole  as prescribed.  IMMUNIZATION: You recently received a COVID-19 vaccination. -Get a flu shot in two weeks.                      Contains text generated by [employer]bridge.                                 Contains text generated by [employer]bridge.

## 2024-03-07 NOTE — Assessment & Plan Note (Signed)
 Sara Hancock

## 2024-03-07 NOTE — Assessment & Plan Note (Signed)
  Orders:   empagliflozin  (JARDIANCE ) 25 MG TABS tablet; Take 1 tablet (25 mg total) by mouth daily.

## 2024-03-07 NOTE — Assessment & Plan Note (Signed)
 SABRA

## 2024-03-07 NOTE — Telephone Encounter (Signed)
 Pavero, Christopher, [facility] to [facility] Triage  (Selected Message)     03/07/24  3:42 PM Per Dr Sheena, patient no longer needs to be on metolazone  Tobb, Kardie, DO to Pavero, Christopher, [facility]      03/04/24  2:55 PM No took her off    S/w the patient and informed her that Dr Sheena said she no longer needs this medication. The patient reports that she feels like this medication does help. She usually takes it every Saturday. She reports that by Saturday, her weight does go up about 2 pounds and then taking the Metolazone  makes it go away. She is afraid that stopping this may cause her problems. She wanted me to send this information to Dr Sheena to see if she will reconsider. Informed her that I will send this information to Dr Sheena and we will call her back. She verbalized understanding.   Instructed her to continue keeping a daily log of her weights.

## 2024-03-09 NOTE — Assessment & Plan Note (Signed)
Cholesterol well controlled with atorvastatin.

## 2024-03-09 NOTE — Assessment & Plan Note (Signed)
 Managed with ursodiol .

## 2024-03-09 NOTE — Telephone Encounter (Signed)
 Medication refilled 03/07/24.

## 2024-03-09 NOTE — Assessment & Plan Note (Signed)
 Managed with Allegra  and Flonase as needed.

## 2024-03-09 NOTE — Assessment & Plan Note (Signed)
 Managed with allopurinol , 100 mg every other day.

## 2024-03-11 DIAGNOSIS — I350 Nonrheumatic aortic (valve) stenosis: Secondary | ICD-10-CM | POA: Insufficient documentation

## 2024-03-11 NOTE — Assessment & Plan Note (Signed)
 Noted on recent echo.  Defer management to cardiology.

## 2024-03-17 ENCOUNTER — Ambulatory Visit (INDEPENDENT_AMBULATORY_CARE_PROVIDER_SITE_OTHER)

## 2024-03-17 DIAGNOSIS — Z23 Encounter for immunization: Secondary | ICD-10-CM

## 2024-03-18 DIAGNOSIS — D485 Neoplasm of uncertain behavior of skin: Secondary | ICD-10-CM | POA: Diagnosis not present

## 2024-03-18 DIAGNOSIS — L988 Other specified disorders of the skin and subcutaneous tissue: Secondary | ICD-10-CM | POA: Diagnosis not present

## 2024-04-04 ENCOUNTER — Other Ambulatory Visit: Payer: Self-pay | Admitting: Family Medicine

## 2024-04-12 ENCOUNTER — Telehealth: Payer: Self-pay

## 2024-04-12 NOTE — Telephone Encounter (Signed)
-----   Message from [facility] W sent at 12/09/2023 11:12 AM EDT ----- Patient needs liver u/s, cbc, cmet, pt/inr, office visit, in 6 months (from 7/30)

## 2024-04-25 ENCOUNTER — Ambulatory Visit: Admitting: Family Medicine

## 2024-04-25 NOTE — Telephone Encounter (Signed)
 Spoke to patient - scheduled her f/u appointment with Dr. Abran for 06/07/2024.  Put in lab and u/s orders - scheduled patient for u/s on 05/30/2024 at 10:00am at [facility].  Patient will come by our office and get labs drawn that day as well so everything will be resulted by her office visit

## 2024-05-02 ENCOUNTER — Other Ambulatory Visit: Payer: Self-pay | Admitting: Family Medicine

## 2024-05-02 DIAGNOSIS — N184 Chronic kidney disease, stage 4 (severe): Secondary | ICD-10-CM

## 2024-05-16 ENCOUNTER — Encounter: Payer: Self-pay | Admitting: Family Medicine

## 2024-05-16 ENCOUNTER — Ambulatory Visit (INDEPENDENT_AMBULATORY_CARE_PROVIDER_SITE_OTHER)
Admission: RE | Admit: 2024-05-16 | Discharge: 2024-05-16 | Disposition: A | Discharge status: Home / Self Care | Source: Ambulatory Visit | Attending: Family Medicine | Admitting: Family Medicine

## 2024-05-16 ENCOUNTER — Ambulatory Visit: Payer: Self-pay | Admitting: Family Medicine

## 2024-05-16 VITALS — BP 124/74 | HR 86 | Temp 98.0°F | Ht 62.0 in | Wt 152.9 lb

## 2024-05-16 DIAGNOSIS — M546 Pain in thoracic spine: Secondary | ICD-10-CM

## 2024-05-16 DIAGNOSIS — E1122 Type 2 diabetes mellitus with diabetic chronic kidney disease: Secondary | ICD-10-CM | POA: Diagnosis not present

## 2024-05-16 DIAGNOSIS — N184 Chronic kidney disease, stage 4 (severe): Secondary | ICD-10-CM

## 2024-05-16 DIAGNOSIS — M810 Age-related osteoporosis without current pathological fracture: Secondary | ICD-10-CM | POA: Diagnosis not present

## 2024-05-16 DIAGNOSIS — R0789 Other chest pain: Secondary | ICD-10-CM | POA: Insufficient documentation

## 2024-05-16 NOTE — Assessment & Plan Note (Signed)
 Check xrays Acute right rib pain post-trauma with severe, persistent pain. Differential includes rib fracture or contusion. Osteoporosis may contribute to fragility. - Ordered chest x-ray and right rib films. - Advised Tylenol  for pain. - Discussed lidocaine  patches for localized pain relief, contingent on assistance for application. - Recommended splinting with a pillow for pain alleviation during deep breaths.  Orders:   DG Chest 2 View; Future   DG Ribs Unilateral Right; Future
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
## 2024-05-16 NOTE — Assessment & Plan Note (Signed)
 Consider alternative treatment then bisphosphates.

## 2024-05-16 NOTE — Assessment & Plan Note (Signed)
 Restricts which osteoporosis medicines recommended.

## 2024-05-24 ENCOUNTER — Ambulatory Visit (INDEPENDENT_AMBULATORY_CARE_PROVIDER_SITE_OTHER)

## 2024-05-24 VITALS — BP 116/76 | HR 77 | Ht 62.0 in | Wt 153.6 lb

## 2024-05-24 DIAGNOSIS — Z0000 Encounter for general adult medical examination without abnormal findings: Secondary | ICD-10-CM | POA: Diagnosis not present

## 2024-05-24 NOTE — Progress Notes (Signed)
 " I connected with  LATOSHA GAYLORD on 05/24/2024   Patient Location: Other:  office  Provider Location: Office/Clinic  Persons Participating in Visit: Patient.   Chief Complaint  Patient presents with   Medicare Wellness     Subjective:   Sara Hancock is a 89 y.o. female who presents for a Medicare Annual Wellness Visit.  Visit info / Clinical Intake: Medicare Wellness Visit Type:: Subsequent Annual Wellness Visit Persons participating in visit and providing information:: patient Medicare Wellness Visit Mode:: In-person (required for WTM) Interpreter Needed?: No Pre-visit prep was completed: no AWV questionnaire completed by patient prior to visit?: no Living arrangements:: (!) lives alone Patient's Overall Health Status Rating: very good Typical amount of pain: none Does pain affect daily life?: no Are you currently prescribed opioids?: no  Dietary Habits and Nutritional Risks How many meals a day?: 3 Eats fruit and vegetables daily?: yes Most meals are obtained by: eating out; having others provide food In the last 2 weeks, have you had any of the following?: none Diabetic:: no  Functional Status Activities of Daily Living (to include ambulation/medication): Independent Ambulation: Independent with device- listed below Home Assistive Devices/Equipment: Cane Medication Administration: Independent Home Management (perform basic housework or laundry): Independent Manage your own finances?: yes Primary transportation is: driving Concerns about vision?: no *vision screening is required for WTM* Concerns about hearing?: no  Fall Screening Falls in the past year?: 0 Number of falls in past year: 0 Was there an injury with Fall?: 0 Fall Risk Category Calculator: 0 Patient Fall Risk Level: Low Fall Risk  Fall Risk Patient at Risk for Falls Due to: Impaired mobility; No Fall Risks Fall risk Follow up: Falls evaluation completed; Falls prevention  discussed  Home and Transportation Safety: All rugs have non-skid backing?: yes All stairs or steps have railings?: yes Grab bars in the bathtub or shower?: yes Have non-skid surface in bathtub or shower?: yes Good home lighting?: yes Regular seat belt use?: yes Hospital stays in the last year:: (!) yes How many hospital stays:: 1 Reason: patient was dehydrated  Cognitive Assessment Difficulty concentrating, remembering, or making decisions? : no Will 6CIT or Mini Cog be Completed: yes What year is it?: 0 points What month is it?: 0 points Give patient an address phrase to remember (5 components): remember words apple ,penny About what time is it?: 0 points Count backwards from 20 to 1: 0 points Say the months of the year in reverse: 0 points Repeat the address phrase from earlier: 0 points 6 CIT Score: 0 points  Advance Directives (For Healthcare) Does Patient Have a Medical Advance Directive?: No Would patient like information on creating a medical advance directive?: No - Patient declined  Reviewed/Updated  Reviewed/Updated: Reviewed All (Medical, Surgical, Family, Medications, Allergies, Care Teams, Patient Goals)    Allergies (verified) Ace inhibitors, Capoten [captopril], and Neomycin-bacitracin zn-polymyx   Current Medications (verified) Outpatient Encounter Medications as of 05/24/2024  Medication Sig   Accu-Chek FastClix Lancets MISC USE TO CHECK BLOOD SUGAR TWICE DAILY   ACCU-CHEK GUIDE TEST test strip USE AS INSTRUCTED TWICE DAILY   allopurinol  (ZYLOPRIM ) 100 MG tablet TAKE ONE-HALF TABLET BY MOUTH EVERY OTHER DAY   amLODipine  (NORVASC ) 2.5 MG tablet TAKE 1 TABLET(2.5 MG) BY MOUTH DAILY   apixaban  (ELIQUIS ) 2.5 MG TABS tablet Take 1 tablet (2.5 mg total) by mouth 2 (two) times daily.   atorvastatin  (LIPITOR) 40 MG tablet TAKE 1 TABLET(40 MG) BY MOUTH DAILY   calcium -vitamin D (  OSCAL WITH D) 250-125 MG-UNIT per tablet Take 1 tablet by mouth 2 (two) times daily.    Cholecalciferol (VITAMIN D3) 1000 units CAPS Take 1,000 Units by mouth daily.   EMBECTA PEN NEEDLE NANO 2 GEN 32G X 4 MM MISC USE A NEEDLE EACH TIME FOR INSULIN  INJECTION   empagliflozin  (JARDIANCE ) 25 MG TABS tablet Take 1 tablet (25 mg total) by mouth daily.   fexofenadine  (ALLEGRA  ALLERGY) 60 MG tablet Take 1 tablet (60 mg total) by mouth 2 (two) times daily. (Patient taking differently: Take 60 mg by mouth daily.)   fluticasone (FLONASE) 50 MCG/ACT nasal spray Place 1 spray into both nostrils as needed.   folic acid  (FOLVITE ) 400 MCG tablet Take 400 mcg by mouth daily.   LANTUS  SOLOSTAR 100 UNIT/ML Solostar Pen INJECT 54 UNITS UNDER THE SKIN EVERY MORNING AND 36 UNITS WITH DINNER   metolazone  (ZAROXOLYN ) 5 MG tablet Take 1 tablet (5 mg total) by mouth once a week.   metoprolol  tartrate (LOPRESSOR ) 100 MG tablet TAKE 1 TABLET BY MOUTH TWICE DAILY   Multiple Vitamin (MULTIVITAMIN WITH MINERALS) TABS Take 1 tablet by mouth daily.   nystatin cream (MYCOSTATIN) APPLY SUFFICIENT AMOUNT TOPICALLY TO THE AFFECTED AREA TWICE DAILY   pantoprazole  (PROTONIX ) 40 MG tablet Take 1 tablet (40 mg total) by mouth daily.   potassium chloride  SA (KLOR-CON  M) 20 MEQ tablet TAKE 1 TABLET(20 MEQ) BY MOUTH DAILY   spironolactone (ALDACTONE) 25 MG tablet Take 25 mg by mouth daily.   torsemide  (DEMADEX ) 20 MG tablet TAKE 2 TABLETS BY MOUTH TWICE DAILY (Patient taking differently: 20 mg daily.)   ursodiol  (ACTIGALL ) 300 MG capsule TAKE 1 CAPSULE(300 MG) BY MOUTH TWICE DAILY   vitamin C  (ASCORBIC ACID ) 500 MG tablet Take 500 mg by mouth daily.   vitamin E  400 UNIT capsule Take 400 Units by mouth daily.   No facility-administered encounter medications on file as of 05/24/2024.    History: Past Medical History:  Diagnosis Date   Acquired thrombophilia 09/11/2019   Allergic rhinitis 09/17/2015   Anemia    ANEMIA, NORMOCYTIC 10/27/2007   Qualifier: Diagnosis of  By: Kowalk CMA (AAMA), Leisha     Anticoagulant  long-term use 01/08/2011   Arthritis    Benign neoplasm of colon 01/08/2011   Biliary cirrhosis (HCC)    BRADYCARDIA 11/15/2008   Qualifier: Diagnosis of  By: Rubbie CMA, Amanda     Cellulitis of left foot 09/25/2016   Cholelithiasis 01/09/12   Chronic kidney disease, stage 3 (HCC)    Combined form of age-related cataract, both eyes 02/26/2017   Diabetes mellitus type 2 without retinopathy (HCC) 10/27/2013   Diverticulosis of colon with hemorrhage 06/08/2000   Dyslipidemia 09/17/2015   Encounter for therapeutic drug monitoring 06/09/2013   Esophageal reflux 01/08/2011   Esophageal stenosis 01/08/11   Esophagitis 01/08/11   GALLSTONES 10/26/2008   Qualifier: Diagnosis of  By: Jakie MD NOLIA Alm SAUNDERS    Gastritis, erosive chronic 01/08/2011   GERD (gastroesophageal reflux disease)    Glaucoma suspect of both eyes 02/26/2017   Glaucoma suspect of both eyes 02/26/2017   Hiatal hernia 01/08/11   History of IBS 10/27/2007   Qualifier: Diagnosis of  By: Kowalk CMA (AAMA), Leisha     HTN (hypertension)    Hx of adenomatous colonic polyps 01/08/11   Hyperplastic colon polyp 06/08/2000, 07/31/2003, 01/08/11   Hypertension, essential 10/27/2007   Qualifier: Diagnosis of  By: Kowalk CMA (AAMA), Chick     Hypertensive heart  and chronic kidney disease without heart failure, with stage 1 through stage 4 chronic kidney disease, or unspecified chronic kidney disease    Iron  deficiency anemia 01/08/2011   Left foot pain 09/25/2016   Localized edema    Long term (current) use of insulin  (HCC)    Mixed hyperlipidemia    Nuclear cataract 07/03/2011   Obesity    Obesity (BMI 30-39.9) 03/22/2012   Osteoarthritis of right thumb 09/17/2015   Osteopenia    Other iron  deficiency anemias    Other specified menopausal and perimenopausal disorders    PAF (paroxysmal atrial fibrillation) (HCC)    occurring in the setting of bradycardia and mild first degree AV block followed by Dr. Beverli   Pain in thoracic spine     Paraesophageal hiatal hernia, 3cm by EGD 03/22/2012   Pedal edema 12/13/2019   Persistent atrial fibrillation (HCC) 08/28/2019   Posterior vitreous detachment, bilateral 01/10/2016   Primary biliary cholangitis (HCC) 10/27/2007   Qualifier: Diagnosis of  By: Genie CMA (AAMA), Chick     Pure hypercholesterolemia    Retinal tear, right 07/03/2011   Shortness of breath 12/13/2019   Stage 3b chronic kidney disease (HCC) 09/11/2019   Type II or unspecified type diabetes mellitus without mention of complication, not stated as uncontrolled    Uterine fibroid 01/09/12   Past Surgical History:  Procedure Laterality Date   BREAST CYST EXCISION     left   CARDIOVERSION N/A 11/27/2017   Procedure: CARDIOVERSION;  Surgeon: Francyne Headland, MD;  Location: [facility];  Service: Cardiovascular;  Laterality: N/A;   COLONOSCOPY     INSERTION OF MESH  05/18/2012   Procedure: INSERTION OF MESH;  Surgeon: Elspeth KYM Schultze, MD;  Location: [facility];  Service: General;  Laterality: N/A;   LIVER BIOPSY  05/18/2012   Procedure: LIVER BIOPSY;  Surgeon: Elspeth KYM Schultze, MD;  Location: [facility];  Service: General;;   POLYPECTOMY     THYROIDECTOMY, PARTIAL  1980   TUBAL LIGATION  1972   VENTRAL HERNIA REPAIR  05/18/2012   Procedure: LAPAROSCOPIC VENTRAL HERNIA;  Surgeon: Elspeth KYM Schultze, MD;  Location: [facility];  Service: General;  Laterality: N/A;  Laparoscopic Ventral Wall Hernia with Mesh   Family History  Problem Relation Age of Onset   Stroke Mother 48   Heart attack Father    Diabetes Sister        x 2   Aneurysm Sister    Stroke Sister    Congestive Heart Failure Sister    Diabetes Brother    Kidney failure Other    Hyperlipidemia Other    Arrhythmia Other    Congestive Heart Failure Other    Hypertension Other    Arthritis Other    Colon cancer Neg Hx    Esophageal cancer Neg Hx    Stomach cancer Neg Hx    Rectal cancer Neg Hx    Social History   Occupational History   Occupation: retired    Associate Professor:  RETIRED  Tobacco Use   Smoking status: Never   Smokeless tobacco: Never  Vaping Use   Vaping status: Never Used  Substance and Sexual Activity   Alcohol use: No   Drug use: No   Sexual activity: Not Currently   Tobacco Counseling Counseling given: Not Answered  SDOH Screenings   Food Insecurity: No Food Insecurity (05/24/2024)  Housing: Low Risk (05/24/2024)  Transportation Needs: No Transportation Needs (05/24/2024)  Utilities: Not At Risk (05/24/2024)  Alcohol Screen: Low  Risk (04/23/2023)  Depression (PHQ2-9): Low Risk (05/24/2024)  Financial Resource Strain: Low Risk (04/23/2023)  Physical Activity: Insufficiently Active (05/24/2024)  Social Connections: Moderately Integrated (04/23/2023)  Stress: No Stress Concern Present (05/24/2024)  Tobacco Use: Low Risk (05/24/2024)  Health Literacy: Adequate Health Literacy (05/24/2024)   See flowsheets for full screening details  Depression Screen PHQ 2 & 9 Depression Scale- Over the past 2 weeks, how often have you been bothered by any of the following problems? Little interest or pleasure in doing things: 0 Feeling down, depressed, or hopeless (PHQ Adolescent also includes...irritable): 0 PHQ-2 Total Score: 0 Trouble falling or staying asleep, or sleeping too much: 0 Feeling tired or having little energy: 0 Poor appetite or overeating (PHQ Adolescent also includes...weight loss): 0 Feeling bad about yourself - or that you are a failure or have let yourself or your family down: 0 Trouble concentrating on things, such as reading the newspaper or watching television (PHQ Adolescent also includes...like school work): 0 Moving or speaking so slowly that other people could have noticed. Or the opposite - being so fidgety or restless that you have been moving around a lot more than usual: 0 Thoughts that you would be better off dead, or of hurting yourself in some way: 0 PHQ-9 Total Score: 0 If you checked off any problems, how difficult have  these problems made it for you to do your work, take care of things at home, or get along with other people?: Not difficult at all  Depression Treatment Depression Interventions/Treatment : EYV7-0 Score <4 Follow-up Not Indicated     Goals Addressed               This Visit's Progress     Patient Stated (pt-stated)        Patient would like to enjoy life              Objective:    Today's Vitals   05/24/24 1052  BP: 116/76  Pulse: 77  SpO2: 96%  Height: 5' 2 (1.575 m)   Body mass index is 27.97 kg/m.  Hearing/Vision screen Hearing Screening - Comments:: No difficulties hearing  Vision Screening - Comments:: Patient wears readers . Patient states she sees Dr, Greven at [facility] in [place] Immunizations and Health Maintenance Health Maintenance  Topic Date Due   Mammogram  07/06/2024   Medicare Annual Wellness (AWV)  07/21/2024   OPHTHALMOLOGY EXAM  07/15/2024   HEMOGLOBIN A1C  08/31/2024   COVID-19 Vaccine (13 - Pfizer risk 2025-26 season) 09/05/2024   FOOT EXAM  03/07/2025   DTaP/Tdap/Td (4 - Td or Tdap) 03/31/2028   Pneumococcal Vaccine: 50+ Years  Completed   Influenza Vaccine  Completed   Bone Density Scan  Completed   Zoster Vaccines- Shingrix  Completed   Meningococcal B Vaccine  Aged Out   Hepatitis B Vaccines 19-59 Average Risk  Discontinued        Assessment/Plan:  This is a routine wellness examination for Clayton.  Patient Care Team: Sherre Clapper, MD as PCP - General (Family Medicine) Tobb, Kardie, DO as PCP - Cardiology (Cardiology) Macel Jayson PARAS, MD as Consulting Physician (Internal Medicine) Abran Norleen SAILOR, MD as Consulting Physician (Gastroenterology) Cheree Banks, MD as Referring Physician (Ophthalmology)  I have personally reviewed and noted the following in the patients chart:   Medical and social history Use of alcohol, tobacco or illicit drugs  Current medications and supplements including opioid  prescriptions. Functional ability and status Nutritional status Physical activity  Advanced directives List of other physicians Hospitalizations, surgeries, and ER visits in previous 12 months Vitals Screenings to include cognitive, depression, and falls Referrals and appointments  No orders of the defined types were placed in this encounter.  In addition, I have reviewed and discussed with patient certain preventive protocols, quality metrics, and best practice recommendations. A written personalized care plan for preventive services as well as general preventive health recommendations were provided to patient.   Lyle MARLA Right, [place]   05/24/2024   No follow-ups on file.  After Visit Summary: (In Person-Declined) Patient declined AVS at this time.  No voiced or noted concerns at this time Patient advised to keep follow-up appointment with PCP (06/28/2024) Screenings not ordered: Declined Referral/Order for mammogram she states she will call them today   "

## 2024-05-24 NOTE — Patient Instructions (Signed)
 Sara Hancock,  Thank you for taking the time for your Medicare Wellness Visit. I appreciate your continued commitment to your health goals. Please review the care plan we discussed, and feel free to reach out if I can assist you further.  Please note that Annual Wellness Visits do not include a physical exam. Some assessments may be limited, especially if the visit was conducted virtually. If needed, we may recommend an in-person follow-up with your provider.  Ongoing Care Seeing your primary care provider every 3 to 6 months helps us  monitor your health and provide consistent, personalized care.   Referrals If a referral was made during today's visit and you haven't received any updates within two weeks, please contact the referred provider directly to check on the status.  Recommended Screenings:  Health Maintenance  Topic Date Due   Breast Cancer Screening  07/06/2024   Medicare Annual Wellness Visit  07/21/2024   Eye exam for diabetics  07/15/2024   Hemoglobin A1C  08/31/2024   COVID-19 Vaccine (13 - Pfizer risk 2025-26 season) 09/05/2024   Complete foot exam   03/07/2025   DTaP/Tdap/Td vaccine (3 - Td or Tdap) 03/31/2028   Pneumococcal Vaccine for age over 41  Completed   Flu Shot  Completed   Osteoporosis screening with Bone Density Scan  Completed   Zoster (Shingles) Vaccine  Completed   Meningitis B Vaccine  Aged Out   Hepatitis B Vaccine  Discontinued       05/24/2024   10:55 AM  Advanced Directives  Does Patient Have a Medical Advance Directive? No  Would patient like information on creating a medical advance directive? No - Patient declined    Vision: Annual vision screenings are recommended for early detection of glaucoma, cataracts, and diabetic retinopathy. These exams can also reveal signs of chronic conditions such as diabetes and high blood pressure.  Dental: Annual dental screenings help detect early signs of oral cancer, gum disease, and other conditions  linked to overall health, including heart disease and diabetes.  Please see the attached documents for additional preventive care recommendations.

## 2024-05-30 ENCOUNTER — Ambulatory Visit (HOSPITAL_COMMUNITY): Admission: RE | Admit: 2024-05-30 | Source: Ambulatory Visit

## 2024-06-02 ENCOUNTER — Other Ambulatory Visit (INDEPENDENT_AMBULATORY_CARE_PROVIDER_SITE_OTHER)

## 2024-06-02 ENCOUNTER — Ambulatory Visit: Payer: Self-pay | Admitting: Internal Medicine

## 2024-06-02 ENCOUNTER — Ambulatory Visit (HOSPITAL_COMMUNITY)
Admission: RE | Admit: 2024-06-02 | Discharge: 2024-06-02 | Disposition: A | Discharge status: Home / Self Care | Source: Ambulatory Visit | Attending: Internal Medicine | Admitting: Internal Medicine

## 2024-06-02 DIAGNOSIS — K743 Primary biliary cirrhosis: Secondary | ICD-10-CM

## 2024-06-02 LAB — CBC WITH DIFFERENTIAL/PLATELET
Basophils Absolute: 0 K/uL (ref 0.0–0.1)
Basophils Relative: 0.5 % (ref 0.0–3.0)
Eosinophils Absolute: 0 K/uL (ref 0.0–0.7)
Eosinophils Relative: 0.5 % (ref 0.0–5.0)
HCT: 36 % (ref 36.0–46.0)
Hemoglobin: 12.1 g/dL (ref 12.0–15.0)
Lymphocytes Relative: 20.2 % (ref 12.0–46.0)
Lymphs Abs: 1.4 K/uL (ref 0.7–4.0)
MCHC: 33.6 g/dL (ref 30.0–36.0)
MCV: 99.8 fl (ref 78.0–100.0)
Monocytes Absolute: 0.8 K/uL (ref 0.1–1.0)
Monocytes Relative: 11.5 % (ref 3.0–12.0)
Neutro Abs: 4.6 K/uL (ref 1.4–7.7)
Neutrophils Relative %: 67.3 % (ref 43.0–77.0)
Platelets: 155 K/uL (ref 150.0–400.0)
RBC: 3.61 Mil/uL — ABNORMAL LOW (ref 3.87–5.11)
RDW: 15.7 % — ABNORMAL HIGH (ref 11.5–15.5)
WBC: 6.8 K/uL (ref 4.0–10.5)

## 2024-06-02 LAB — COMPREHENSIVE METABOLIC PANEL WITH GFR
ALT: 14 U/L (ref 3–35)
AST: 21 U/L (ref 5–37)
Albumin: 3.9 g/dL (ref 3.5–5.2)
Alkaline Phosphatase: 130 U/L — ABNORMAL HIGH (ref 39–117)
BUN: 84 mg/dL — ABNORMAL HIGH (ref 6–23)
CO2: 31 meq/L (ref 19–32)
Calcium: 9.7 mg/dL (ref 8.4–10.5)
Chloride: 96 meq/L (ref 96–112)
Creatinine, Ser: 2.64 mg/dL — ABNORMAL HIGH (ref 0.40–1.20)
GFR: 15.8 mL/min — ABNORMAL LOW
Glucose, Bld: 113 mg/dL — ABNORMAL HIGH (ref 70–99)
Potassium: 3.4 meq/L — ABNORMAL LOW (ref 3.5–5.1)
Sodium: 140 meq/L (ref 135–145)
Total Bilirubin: 0.5 mg/dL (ref 0.2–1.2)
Total Protein: 8.1 g/dL (ref 6.0–8.3)

## 2024-06-02 LAB — PROTIME-INR
INR: 1.2 ratio — ABNORMAL HIGH (ref 0.8–1.0)
Prothrombin Time: 13.2 s — ABNORMAL HIGH (ref 9.6–13.1)

## 2024-06-07 ENCOUNTER — Ambulatory Visit: Admitting: Internal Medicine

## 2024-06-07 ENCOUNTER — Other Ambulatory Visit

## 2024-06-14 ENCOUNTER — Other Ambulatory Visit: Payer: Self-pay | Admitting: Family Medicine

## 2024-06-14 ENCOUNTER — Ambulatory Visit: Admitting: Family Medicine

## 2024-06-14 DIAGNOSIS — Z1231 Encounter for screening mammogram for malignant neoplasm of breast: Secondary | ICD-10-CM

## 2024-06-17 ENCOUNTER — Other Ambulatory Visit: Payer: Self-pay

## 2024-06-17 DIAGNOSIS — E782 Mixed hyperlipidemia: Secondary | ICD-10-CM

## 2024-06-17 DIAGNOSIS — Z794 Long term (current) use of insulin: Secondary | ICD-10-CM

## 2024-06-17 DIAGNOSIS — I152 Hypertension secondary to endocrine disorders: Secondary | ICD-10-CM

## 2024-06-21 ENCOUNTER — Other Ambulatory Visit

## 2024-06-28 ENCOUNTER — Ambulatory Visit: Admitting: Physician Assistant

## 2024-06-28 ENCOUNTER — Ambulatory Visit: Admitting: Family Medicine

## 2024-08-31 ENCOUNTER — Ambulatory Visit
